# Patient Record
Sex: Female | Born: 1935 | Race: Black or African American | Hispanic: No | Smoking: Former smoker
Health system: Southern US, Community
[De-identification: ages and names within clinical notes are randomized; demographics above are authoritative.]

## PROBLEM LIST (undated history)

## (undated) DIAGNOSIS — E119 Type 2 diabetes mellitus without complications: Secondary | ICD-10-CM

## (undated) DIAGNOSIS — Z8669 Personal history of other diseases of the nervous system and sense organs: Secondary | ICD-10-CM

## (undated) DIAGNOSIS — N179 Acute kidney failure, unspecified: Secondary | ICD-10-CM

## (undated) DIAGNOSIS — I63132 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: Secondary | ICD-10-CM

## (undated) DIAGNOSIS — E1165 Type 2 diabetes mellitus with hyperglycemia: Secondary | ICD-10-CM

## (undated) DIAGNOSIS — Z9889 Other specified postprocedural states: Secondary | ICD-10-CM

## (undated) DIAGNOSIS — G459 Transient cerebral ischemic attack, unspecified: Secondary | ICD-10-CM

## (undated) DIAGNOSIS — M1990 Unspecified osteoarthritis, unspecified site: Secondary | ICD-10-CM

## (undated) DIAGNOSIS — E118 Type 2 diabetes mellitus with unspecified complications: Secondary | ICD-10-CM

## (undated) DIAGNOSIS — I10 Essential (primary) hypertension: Secondary | ICD-10-CM

## (undated) HISTORY — PX: TONSILLECTOMY: SUR1361

## (undated) HISTORY — PX: EYE SURGERY: SHX253

---

## 2011-12-23 ENCOUNTER — Other Ambulatory Visit (HOSPITAL_COMMUNITY)
Admission: RE | Admit: 2011-12-23 | Discharge: 2011-12-23 | Disposition: A | Discharge status: Home / Self Care | Payer: Self-pay | Source: Ambulatory Visit | Attending: Family Medicine | Admitting: Family Medicine

## 2011-12-23 DIAGNOSIS — Z124 Encounter for screening for malignant neoplasm of cervix: Secondary | ICD-10-CM | POA: Insufficient documentation

## 2014-01-12 ENCOUNTER — Observation Stay (HOSPITAL_COMMUNITY): Payer: Medicare PPO

## 2014-01-12 ENCOUNTER — Inpatient Hospital Stay (HOSPITAL_COMMUNITY)
Admission: EM | Admit: 2014-01-12 | Discharge: 2014-01-14 | DRG: 066 | Disposition: A | Discharge status: Home / Self Care | Payer: Medicare PPO | Attending: Internal Medicine | Admitting: Internal Medicine

## 2014-01-12 ENCOUNTER — Encounter (HOSPITAL_COMMUNITY): Payer: Self-pay

## 2014-01-12 ENCOUNTER — Other Ambulatory Visit: Payer: Self-pay

## 2014-01-12 ENCOUNTER — Emergency Department (HOSPITAL_COMMUNITY): Payer: Medicare PPO

## 2014-01-12 DIAGNOSIS — I10 Essential (primary) hypertension: Secondary | ICD-10-CM | POA: Diagnosis present

## 2014-01-12 DIAGNOSIS — I63132 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: Principal | ICD-10-CM | POA: Insufficient documentation

## 2014-01-12 DIAGNOSIS — I639 Cerebral infarction, unspecified: Secondary | ICD-10-CM | POA: Diagnosis present

## 2014-01-12 DIAGNOSIS — G459 Transient cerebral ischemic attack, unspecified: Secondary | ICD-10-CM | POA: Diagnosis present

## 2014-01-12 DIAGNOSIS — I739 Peripheral vascular disease, unspecified: Secondary | ICD-10-CM

## 2014-01-12 DIAGNOSIS — Z79899 Other long term (current) drug therapy: Secondary | ICD-10-CM

## 2014-01-12 DIAGNOSIS — E119 Type 2 diabetes mellitus without complications: Secondary | ICD-10-CM

## 2014-01-12 DIAGNOSIS — I779 Disorder of arteries and arterioles, unspecified: Secondary | ICD-10-CM | POA: Diagnosis present

## 2014-01-12 DIAGNOSIS — Z87891 Personal history of nicotine dependence: Secondary | ICD-10-CM

## 2014-01-12 DIAGNOSIS — R278 Other lack of coordination: Secondary | ICD-10-CM | POA: Diagnosis present

## 2014-01-12 DIAGNOSIS — R4781 Slurred speech: Secondary | ICD-10-CM | POA: Diagnosis not present

## 2014-01-12 DIAGNOSIS — E785 Hyperlipidemia, unspecified: Secondary | ICD-10-CM | POA: Diagnosis present

## 2014-01-12 DIAGNOSIS — Z8673 Personal history of transient ischemic attack (TIA), and cerebral infarction without residual deficits: Secondary | ICD-10-CM

## 2014-01-12 DIAGNOSIS — R471 Dysarthria and anarthria: Secondary | ICD-10-CM | POA: Diagnosis present

## 2014-01-12 DIAGNOSIS — R29898 Other symptoms and signs involving the musculoskeletal system: Secondary | ICD-10-CM | POA: Diagnosis present

## 2014-01-12 DIAGNOSIS — I6789 Other cerebrovascular disease: Secondary | ICD-10-CM

## 2014-01-12 HISTORY — DX: Transient cerebral ischemic attack, unspecified: G45.9

## 2014-01-12 HISTORY — DX: Essential (primary) hypertension: I10

## 2014-01-12 HISTORY — DX: Type 2 diabetes mellitus without complications: E11.9

## 2014-01-12 LAB — URINALYSIS, ROUTINE W REFLEX MICROSCOPIC
BILIRUBIN URINE: NEGATIVE
Glucose, UA: NEGATIVE mg/dL
Hgb urine dipstick: NEGATIVE
Ketones, ur: 15 mg/dL — AB
Leukocytes, UA: NEGATIVE
Nitrite: NEGATIVE
PH: 6.5 (ref 5.0–8.0)
Protein, ur: NEGATIVE mg/dL
SPECIFIC GRAVITY, URINE: 1.02 (ref 1.005–1.030)
Urobilinogen, UA: 1 mg/dL (ref 0.0–1.0)

## 2014-01-12 LAB — CBC WITH DIFFERENTIAL/PLATELET
Basophils Absolute: 0 10*3/uL (ref 0.0–0.1)
Basophils Relative: 0 % (ref 0–1)
EOS ABS: 0.1 10*3/uL (ref 0.0–0.7)
EOS PCT: 1 % (ref 0–5)
HCT: 43 % (ref 36.0–46.0)
HEMOGLOBIN: 14 g/dL (ref 12.0–15.0)
LYMPHS ABS: 1.1 10*3/uL (ref 0.7–4.0)
Lymphocytes Relative: 23 % (ref 12–46)
MCH: 27.6 pg (ref 26.0–34.0)
MCHC: 32.6 g/dL (ref 30.0–36.0)
MCV: 84.8 fL (ref 78.0–100.0)
MONOS PCT: 23 % — AB (ref 3–12)
Monocytes Absolute: 1.1 10*3/uL — ABNORMAL HIGH (ref 0.1–1.0)
NEUTROS PCT: 53 % (ref 43–77)
Neutro Abs: 2.5 10*3/uL (ref 1.7–7.7)
Platelets: 178 10*3/uL (ref 150–400)
RBC: 5.07 MIL/uL (ref 3.87–5.11)
RDW: 14.3 % (ref 11.5–15.5)
WBC: 4.9 10*3/uL (ref 4.0–10.5)

## 2014-01-12 LAB — GLUCOSE, CAPILLARY
GLUCOSE-CAPILLARY: 127 mg/dL — AB (ref 70–99)
GLUCOSE-CAPILLARY: 93 mg/dL (ref 70–99)
GLUCOSE-CAPILLARY: 165 mg/dL — AB (ref 70–99)

## 2014-01-12 LAB — COMPREHENSIVE METABOLIC PANEL
ALT: 10 U/L (ref 0–35)
ANION GAP: 16 — AB (ref 5–15)
AST: 18 U/L (ref 0–37)
Albumin: 3.5 g/dL (ref 3.5–5.2)
Alkaline Phosphatase: 95 U/L (ref 39–117)
BUN: 19 mg/dL (ref 6–23)
CALCIUM: 9.1 mg/dL (ref 8.4–10.5)
CO2: 23 meq/L (ref 19–32)
CREATININE: 0.94 mg/dL (ref 0.50–1.10)
Chloride: 100 mEq/L (ref 96–112)
GFR calc non Af Amer: 57 mL/min — ABNORMAL LOW (ref >90)
GFR, EST AFRICAN AMERICAN: 67 mL/min — AB (ref >90)
Glucose, Bld: 139 mg/dL — ABNORMAL HIGH (ref 70–99)
Potassium: 4.2 mEq/L (ref 3.7–5.3)
Sodium: 139 mEq/L (ref 137–147)
Total Bilirubin: 0.4 mg/dL (ref 0.3–1.2)
Total Protein: 7.1 g/dL (ref 6.0–8.3)

## 2014-01-12 LAB — RAPID URINE DRUG SCREEN, HOSP PERFORMED
Amphetamines: NOT DETECTED
Barbiturates: NOT DETECTED
Benzodiazepines: NOT DETECTED
COCAINE: NOT DETECTED
Opiates: NOT DETECTED
Tetrahydrocannabinol: NOT DETECTED

## 2014-01-12 LAB — APTT: APTT: 25 s (ref 24–37)

## 2014-01-12 LAB — POCT I-STAT, CHEM 8
BUN: 20 mg/dL (ref 6–23)
Calcium, Ion: 1.03 mmol/L — ABNORMAL LOW (ref 1.13–1.30)
Chloride: 103 mEq/L (ref 96–112)
Creatinine, Ser: 1 mg/dL (ref 0.50–1.10)
GLUCOSE: 143 mg/dL — AB (ref 70–99)
HCT: 46 % (ref 36.0–46.0)
HEMOGLOBIN: 15.6 g/dL — AB (ref 12.0–15.0)
Potassium: 3.8 mEq/L (ref 3.7–5.3)
Sodium: 137 mEq/L (ref 137–147)
TCO2: 24 mmol/L (ref 0–100)

## 2014-01-12 LAB — HEMOGLOBIN A1C
HEMOGLOBIN A1C: 7.2 % — AB (ref <5.7)
Mean Plasma Glucose: 160 mg/dL — ABNORMAL HIGH (ref <117)

## 2014-01-12 LAB — PROTIME-INR
INR: 0.99 (ref 0.00–1.49)
Prothrombin Time: 13.1 seconds (ref 11.6–15.2)

## 2014-01-12 LAB — CBG MONITORING, ED: Glucose-Capillary: 133 mg/dL — ABNORMAL HIGH (ref 70–99)

## 2014-01-12 LAB — ETHANOL

## 2014-01-12 MED ORDER — HYDRALAZINE HCL 20 MG/ML IJ SOLN: 10.0000 mg | Freq: Four times a day (QID) | INTRAMUSCULAR | Status: DC | PRN

## 2014-01-12 MED ORDER — STROKE: EARLY STAGES OF RECOVERY BOOK
Freq: Once | Status: AC
  Administered 2014-01-13: 13:00:00
  Filled 2014-01-12: qty 1

## 2014-01-12 MED ORDER — ATORVASTATIN CALCIUM 20 MG PO TABS
20.0000 mg | ORAL_TABLET | Freq: Every day | ORAL | Status: DC
  Administered 2014-01-12 – 2014-01-13 (×2): 20 mg via ORAL
  Filled 2014-01-12 (×2): qty 1

## 2014-01-12 MED ORDER — ASPIRIN 325 MG PO TABS
325.0000 mg | ORAL_TABLET | Freq: Every day | ORAL | Status: DC
  Administered 2014-01-12 – 2014-01-13 (×2): 325 mg via ORAL

## 2014-01-12 MED ORDER — HEPARIN SODIUM (PORCINE) 5000 UNIT/ML IJ SOLN
5000.0000 [IU] | Freq: Three times a day (TID) | INTRAMUSCULAR | Status: DC
  Administered 2014-01-12 – 2014-01-14 (×6): 5000 [IU] via SUBCUTANEOUS
  Filled 2014-01-12 (×3): qty 1

## 2014-01-12 MED ORDER — SODIUM CHLORIDE 0.9 % IV SOLN
INTRAVENOUS | Status: DC
Start: 2014-01-12 — End: 2014-01-13
  Administered 2014-01-12: 17:00:00 via INTRAVENOUS

## 2014-01-12 MED ORDER — ASPIRIN 300 MG RE SUPP: 300.0000 mg | Freq: Every day | RECTAL | Status: DC

## 2014-01-12 MED ORDER — INSULIN ASPART 100 UNIT/ML ~~LOC~~ SOLN
0.0000 [IU] | Freq: Three times a day (TID) | SUBCUTANEOUS | Status: DC
Start: 2014-01-12 — End: 2014-01-14
  Administered 2014-01-13 – 2014-01-14 (×3): 1 [IU] via SUBCUTANEOUS
  Administered 2014-01-14: 2 [IU] via SUBCUTANEOUS

## 2014-01-12 MED ORDER — ACETAMINOPHEN 650 MG RE SUPP: 650.0000 mg | RECTAL | Status: DC | PRN

## 2014-01-12 MED ORDER — ACETAMINOPHEN 325 MG PO TABS: 650.0000 mg | ORAL_TABLET | ORAL | Status: DC | PRN

## 2014-01-12 MED ORDER — INSULIN ASPART 100 UNIT/ML ~~LOC~~ SOLN: 0.0000 [IU] | Freq: Every day | SUBCUTANEOUS | Status: DC

## 2014-01-12 NOTE — H&P (Signed)
History and Physical       Hospital Admission Note Date: 01/12/2014  Patient name: Kathleen Figueroa Medical record number: [id number] Date of birth: 1935/09/26 Age: 78 y.o. Gender: female  PCP: Gaye Alken, MD    Chief Complaint:  Right arm weakness, headache with slurred speech  HPI: Patient is a 78 year old female with prior history of TIA, hypertension, diabetes mellitus, hyperlipidemia presented to ED with right arm clumsiness, headache yesterday with slurred speech. Patient reported that she noticed her right hand was clumsy after the Thanksgiving, patient thought she was tired. However 2 days ago on Wednesday, patient started having headache with slurred speech. She reports that her right hand was clumsy, weak with difficulty coordination or writing. Patient called her PCP today who advised her to go to the ER asap. Patient was seen by neurology, she was not considered a TPA candidate due to delayed presentation. CT head showed possible recent infarct at the left temporal occipital junction with a trophy and multiple prior small lacunar infarcts in the thalamic and basal ganglia regions as well as moderate small vessel disease in the centrum semiovale. Patient is being admitted for stroke workup.   Review of Systems:  Constitutional: Denies fever, chills, diaphoresis, poor appetite and fatigue.  HEENT: Denies photophobia, eye pain, redness, hearing loss, ear pain, congestion, sore throat, rhinorrhea, sneezing, mouth sores, trouble swallowing, neck pain, neck stiffness and tinnitus.   Respiratory: Denies SOB, DOE, cough, chest tightness,  and wheezing.   Cardiovascular: Denies chest pain, palpitations and leg swelling.  Gastrointestinal: Denies nausea, vomiting, abdominal pain, diarrhea, constipation, blood in stool and abdominal distention.  Genitourinary: Denies dysuria, urgency, frequency, hematuria, flank pain and  difficulty urinating.  Musculoskeletal: Denies myalgias, back pain, joint swelling, arthralgias and gait problem.  Skin: Denies pallor, rash and wound.  Neurological:Please see history of present illness  Hematological: Denies adenopathy. Easy bruising, personal or family bleeding history  Psychiatric/Behavioral: Denies suicidal ideation, mood changes, confusion, nervousness, sleep disturbance and agitation  Past Medical History: Past Medical History  Diagnosis Date  . TIA (transient ischemic attack)   . Diabetes mellitus without complication   . Hypertension    Past Surgical History  Procedure Laterality Date  . Eye surgery      Medications: Prior to Admission medications   Medication Sig Start Date End Date Taking? Authorizing Provider  amLODipine (NORVASC) 10 MG tablet Take 10 mg by mouth daily.   Yes Historical Provider, MD  atorvastatin (LIPITOR) 20 MG tablet Take 20 mg by mouth daily.   Yes Historical Provider, MD  metFORMIN (GLUCOPHAGE) 1000 MG tablet Take 1,000 mg by mouth daily with breakfast.   Yes Historical Provider, MD  valsartan (DIOVAN) 320 MG tablet Take 320 mg by mouth daily.   Yes Historical Provider, MD    Allergies:   Allergies  Allergen Reactions  . Aspirin Nausea Only    Social History:  reports that she quit smoking about 30 years ago. She does not have any smokeless tobacco history on file. She reports that she does not drink alcohol. Her drug history is not on file.  Family History: Family History  Problem Relation Age of Onset  . Hypertension Mother   . Hypertension Father     Physical Exam: Blood pressure 159/74, pulse 80, temperature 98.1 F (36.7 C), temperature source Oral, resp. rate 21, SpO2 96 %. General: Alert, awake, oriented x3, in no acute distress. HEENT: normocephalic, atraumatic, anicteric sclera, pink conjunctiva, pupils equal and reactive to light and accomodation,  oropharynx clear Neck: supple, no masses or lymphadenopathy,  no goiter, no bruits  Heart: Regular rate and rhythm, without murmurs, rubs or gallops. Lungs: Clear to auscultation bilaterally, no wheezing, rales or rhonchi. Abdomen: Soft, nontender, nondistended, positive bowel sounds, no masses. Extremities: No clubbing, cyanosis or edema with positive pedal pulses. Neuro: Grossly intact, no focal neurological deficits, strength 5/5 upper and lower extremities bilaterally Psych: alert and oriented x 3, normal mood and affect Skin: no rashes or lesions, warm and dry   LABS on Admission:  Basic Metabolic Panel:  Recent Labs Lab 01/12/14 1045  NA 139  K 4.2  CL 100  CO2 23  GLUCOSE 139*  BUN 19  CREATININE 0.94  CALCIUM 9.1   Liver Function Tests:  Recent Labs Lab 01/12/14 1045  AST 18  ALT 10  ALKPHOS 95  BILITOT 0.4  PROT 7.1  ALBUMIN 3.5   No results for input(s): LIPASE, AMYLASE in the last 168 hours. No results for input(s): AMMONIA in the last 168 hours. CBC:  Recent Labs Lab 01/12/14 1045  WBC 4.9  NEUTROABS 2.5  HGB 14.0  HCT 43.0  MCV 84.8  PLT 178   Cardiac Enzymes: No results for input(s): CKTOTAL, CKMB, CKMBINDEX, TROPONINI in the last 168 hours. BNP: Invalid input(s): POCBNP CBG:  Recent Labs Lab 01/12/14 1037  GLUCAP 133*     Radiological Exams on Admission: Ct Head Wo Contrast  01/12/2014   CLINICAL DATA:  Two day history of right arm weakness and vision difficulty; 1 day history of headache  EXAM: CT HEAD WITHOUT CONTRAST  TECHNIQUE: Contiguous axial images were obtained from the base of the skull through the vertex without intravenous contrast.  COMPARISON:  None.  FINDINGS: There is mild generalized atrophy. There is no intracranial mass, hemorrhage, extra-axial fluid collection, or midline shift. There is an age uncertain infarct at the left temporal -occipital junction. There is patchy small vessel disease in the centra semiovale bilaterally. There are small chronic appearing lacunar infarcts  scattered throughout both basal ganglia regions as well as in both thalami. The bony calvarium appears intact. The mastoid air cells are clear. Cataract formation is noted in the right eye.  IMPRESSION: Age uncertain and possibly recent infarct at the left temporal -occipital junction. There is atrophy with multiple prior small lacunar infarcts in the thalamic and basal ganglia regions as well as moderate small vessel disease in the centra semiovale. Mild generalized atrophy. No hemorrhage or mass effect.   Electronically Signed   By: Bretta BangWilliam  Woodruff M.D.   On: 01/12/2014 11:04    Assessment/Plan Principal Problem:   Right arm weakness, dysarthria, headache/ Ac CVA: CT head shows recent infarct at the left temporal occipital junction. Risks factors diabetes, hypertension, hyperlipidemia, prior history of TIA, not on aspirin PTA -Admit for full stroke workup, obtain MRI, MRA brain - Obtain 2-D echo, carotid Dopplers, lipid panel, hemoglobin A1c - Placed on aspirin 325 mg daily PO or PR (pending swallow evaluation) - PT, OT, ST eval, neurology consulted  Active Problems:   Diabetes mellitus without complication - Hold metformin, place on sliding serum insulin, obtain hemoglobin A1c    Hypertension - Hold antihypertensives, place on hydralazine as needed with parameters  Hyperlipidemia - Obtain lipid panel, continue statins   DVT prophylaxis: Lovenox  CODE STATUS: Full code  Family Communication: Admission, patients condition and plan of care including tests being ordered have been discussed with the patient who indicates understanding and agree with the plan and  Code Status   Further plan will depend as patient's clinical course evolves and further radiologic and laboratory data become available.   Time Spent on Admission: 1 hour  RAI,RIPUDEEP M.D. [facility] 01/12/2014, 12:20 PM Pager: [phone]  If 7PM-7AM, please contact night-coverage [email or web address] Password  [email or web address]

## 2014-01-12 NOTE — Progress Notes (Signed)
Echo Lab  2D Echocardiogram completed.  Katharina CaperMelissa L Ciearra Rufo, RDCS 01/12/2014 3:33 PM

## 2014-01-12 NOTE — Consult Note (Signed)
Referring Physician: Pollina    Chief Complaint: stroke  HPI:                                                                                                                                         Kathleen Figueroa is an 78 y.o. female with known risk factors of HTN, DM and hyperlipidemia.  Pateitn states she had one episode 8 days ago during thanksgiving when she had right hand clumsiness. This resolved.  She then had another episode two days ago with right hand and arm clumsiness. This did not resolve but she did not seek medical attention.  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 At present time she feels her symptoms have resolve.   Date last known well: Unable to determine Time last known well: Unable to determine tPA Given: No: no known LSN  Past Medical History  Diagnosis Date  . TIA (transient ischemic attack)   . Diabetes mellitus without complication   . Hypertension     Past Surgical History  Procedure Laterality Date  . Eye surgery      Family History  Problem Relation Age of Onset  . Hypertension Mother   . Hypertension Father    Social History:  reports that she quit smoking about 30 years ago. She does not have any smokeless tobacco history on file. She reports that she does not drink alcohol. Her drug history is not on file.  Allergies:  Allergies  Allergen Reactions  . Aspirin Nausea Only    Medications:                                                                                                                           No current facility-administered medications for this encounter.   Current Outpatient Prescriptions  Medication Sig Dispense Refill  . amLODipine (NORVASC) 10 MG tablet Take 10 mg by mouth daily.    Marland Kitchen atorvastatin (LIPITOR) 20 MG tablet Take 20 mg by mouth daily.    . metFORMIN  (GLUCOPHAGE) 1000 MG tablet Take 1,000 mg by mouth daily with breakfast.    . valsartan (DIOVAN) 320 MG tablet Take 320 mg by mouth daily.       ROS:  History obtained from the patient  General ROS: negative for - chills, fatigue, fever, night sweats, weight gain or weight loss Psychological ROS: negative for - behavioral disorder, hallucinations, memory difficulties, mood swings or suicidal ideation Ophthalmic ROS: negative for - blurry vision, double vision, eye pain or loss of vision ENT ROS: negative for - epistaxis, nasal discharge, oral lesions, sore throat, tinnitus or vertigo Allergy and Immunology ROS: negative for - hives or itchy/watery eyes Hematological and Lymphatic ROS: negative for - bleeding problems, bruising or swollen lymph nodes Endocrine ROS: negative for - galactorrhea, hair pattern changes, polydipsia/polyuria or temperature intolerance Respiratory ROS: negative for - cough, hemoptysis, shortness of breath or wheezing Cardiovascular ROS: negative for - chest pain, dyspnea on exertion, edema or irregular heartbeat Gastrointestinal ROS: negative for - abdominal pain, diarrhea, hematemesis, nausea/vomiting or stool incontinence Genito-Urinary ROS: negative for - dysuria, hematuria, incontinence or urinary frequency/urgency Musculoskeletal ROS: negative for - joint swelling or muscular weakness Neurological ROS: as noted in HPI Dermatological ROS: negative for rash and skin lesion changes  Neurologic Examination:                                                                                                      Blood pressure 170/60, pulse 87, temperature 98.2 F (36.8 C), temperature source Oral, resp. rate 18, SpO2 97 %.   Physical Exam  Constitutional: He appears well-developed and well-nourished.  Psych: Affect appropriate to  situation Eyes: No scleral injection HENT: No OP obstrucion Head: Normocephalic.  Cardiovascular: Normal rate and regular rhythm.  Respiratory: Effort normal and breath sounds normal.  GI: Soft. Bowel sounds are normal. No distension. There is no tenderness.  Skin: WDI  Neuro Exam: Mental Status: Alert, oriented, thought content appropriate.  Speech fluent without evidence of aphasia.  Able to follow 3 step commands without difficulty. Cranial Nerves: II: Discs flat bilaterally; Visual fields grossly normal, right pupil was oval (post surgical) and left was round, reactive to light and accommodation III,IV, VI: ptosis not present, extra-ocular motions intact bilaterally V,VII: mild flattening of R NLF, facial light touch sensation normal bilaterally VIII: hearing normal bilaterally IX,X: gag reflex present XI: bilateral shoulder shrug XII: midline tongue extension without atrophy or fasciculations  Motor: Right : Upper extremity   5/5    Left:     Upper extremity   5/5  Lower extremity   5/5     Lower extremity   5/5 --Right UE pronator drift  Tone and bulk:normal tone throughout; no atrophy noted Sensory: Pinprick and light touch intact throughout, bilaterally Deep Tendon Reflexes:  Right: Upper Extremity   Left: Upper extremity   biceps (C-5 to C-6) 2/4   biceps (C-5 to C-6) 2/4 tricep (C7) 2/4    triceps (C7) 2/4 Brachioradialis (C6) 2/4  Brachioradialis (C6) 2/4  Lower Extremity Lower Extremity  quadriceps (L-2 to L-4) 0/4   quadriceps (L-2 to L-4) 0/4 Achilles (S1) 0/4   Achilles (S1) 0/4  Plantars: Right: downgoing   Left: downgoing Cerebellar: normal finger-to-nose,  normal heel-to-shin test Gait: not tested due to saftey  CV: pulses palpable throughout    Lab Results: Basic Metabolic Panel:  Recent Labs Lab 01/12/14 1045  NA 139  K 4.2  CL 100  CO2 23  GLUCOSE 139*  BUN 19  CREATININE 0.94  CALCIUM 9.1    Liver Function Tests:  Recent Labs Lab  01/12/14 1045  AST 18  ALT 10  ALKPHOS 95  BILITOT 0.4  PROT 7.1  ALBUMIN 3.5   No results for input(s): LIPASE, AMYLASE in the last 168 hours. No results for input(s): AMMONIA in the last 168 hours.  CBC:  Recent Labs Lab 01/12/14 1045  WBC 4.9  NEUTROABS 2.5  HGB 14.0  HCT 43.0  MCV 84.8  PLT 178    Cardiac Enzymes: No results for input(s): CKTOTAL, CKMB, CKMBINDEX, TROPONINI in the last 168 hours.  Lipid Panel: No results for input(s): CHOL, TRIG, HDL, CHOLHDL, VLDL, LDLCALC in the last 168 hours.  CBG:  Recent Labs Lab 01/12/14 1037  GLUCAP 133*    Microbiology: No results found for this or any previous visit.  Coagulation Studies:  Recent Labs  01/12/14 1045  LABPROT 13.1  INR 0.99    Imaging: Ct Head Wo Contrast  01/12/2014   CLINICAL DATA:  Two day history of right arm weakness and vision difficulty; 1 day history of headache  EXAM: CT HEAD WITHOUT CONTRAST  TECHNIQUE: Contiguous axial images were obtained from the base of the skull through the vertex without intravenous contrast.  COMPARISON:  None.  FINDINGS: There is mild generalized atrophy. There is no intracranial mass, hemorrhage, extra-axial fluid collection, or midline shift. There is an age uncertain infarct at the left temporal -occipital junction. There is patchy small vessel disease in the centra semiovale bilaterally. There are small chronic appearing lacunar infarcts scattered throughout both basal ganglia regions as well as in both thalami. The bony calvarium appears intact. The mastoid air cells are clear. Cataract formation is noted in the right eye.  IMPRESSION: Age uncertain and possibly recent infarct at the left temporal -occipital junction. There is atrophy with multiple prior small lacunar infarcts in the thalamic and basal ganglia regions as well as moderate small vessel disease in the centra semiovale. Mild generalized atrophy. No hemorrhage or mass effect.   Electronically Signed    By: Bretta BangWilliam  Woodruff M.D.   On: 01/12/2014 11:04       Assessment and plan discussed with with attending physician and they are in agreement.    Felicie MornDavid Smith PA-C [facility] [phone]  01/12/2014, 11:37 AM   Assessment: 78 y.o. female female with two episodes of right arm weakness and most recent episode of right arm weakness and expressive difficulties.  She remains to show right arm pronator drift on exam and CT head shows possible stroke in the left temporal-occipital junction. Patient would benefit from stroke work up given stuttering symptoms and current right arm weakness.    Stroke Risk Factors - diabetes mellitus, hyperlipidemia and hypertension  1. HgbA1c, fasting lipid panel 2. MRI, MRA  of the brain without contrast 3. PT consult, OT consult, Speech consult 4. Echocardiogram 5. Carotid dopplers 6. Prophylactic therapy-Antiplatelet med: Aspirin - dose 81 mg daily 7. Risk factor modification 8. Telemetry monitoring 9. Frequent neuro checks   Patient seen and evaluated. Agree with above assessment and plan. In brief she is a 78y/o woman with multiple risk factors presenting with right sided weakness. Her symptoms have been on and off for a few days and have persisted since yesterday.  Will admit for stroke workup.   Elspeth Cho, DO [facility] [phone]  If 7pm- 7am, please page neurology on call as listed in AMION.

## 2014-01-12 NOTE — ED Notes (Signed)
CBG 133 

## 2014-01-12 NOTE — ED Provider Notes (Signed)
CSN: [id number]     Arrival date & time 01/12/14  1007 History   First MD Initiated Contact with Patient 01/12/14 1010     Chief Complaint  Patient presents with  . Stroke Symptoms     (Consider location/radiation/quality/duration/timing/severity/associated sxs/prior Treatment) The history is provided by the patient. No language interpreter was used.  Kathleen Figueroa is a 78 y/o F with PMHx of HTN, DM, TIA x 5 years ago presenting to the ED with stroke like symptoms that started yesterday, but have now resolved. Patient reported that back on Thanksgiving she was experiencing muscle spasms to her right arm and stated that she thought this was due to her cooking a lot. Reported that she gets intermittent spasms to her right arm, but stated that she has been having weakness to the right arm starting yesterday. Patient reported that 2 days ago she woke up with a headache localized to the top of the head described as a pressure sensation that resolved on its own - denied thunderclap, worsening, or worst headache of life. Stated that yesterday while reading the Bible patient was having difficulty forming the words, stated that she knew the words, but stated that she was unable to produce them. Denied fever, chills, neck pain, neck stiffness, chest pain, shortness of breath, difficulty breathing, numbness, tingling, fall, head injury, blurred vision, sudden loss of vision. PCP Dr. Francine Graven at [facility]  Past Medical History  Diagnosis Date  . TIA (transient ischemic attack)   . Diabetes mellitus without complication   . Hypertension    Past Surgical History  Procedure Laterality Date  . Eye surgery     Family History  Problem Relation Age of Onset  . Hypertension Mother   . Hypertension Father    History  Substance Use Topics  . Smoking status: Former Smoker    Quit date: 02/10/1983  . Smokeless tobacco: Not on file  . Alcohol Use: No   OB History    No data available     Review  of Systems  Constitutional: Negative for fever and chills.  Eyes: Negative for visual disturbance.  Respiratory: Negative for chest tightness and shortness of breath.   Cardiovascular: Negative for chest pain.  Gastrointestinal: Negative for nausea, vomiting and abdominal pain.  Musculoskeletal: Negative for back pain and neck pain.  Neurological: Positive for weakness and headaches. Negative for numbness.      Allergies  Aspirin  Home Medications   Prior to Admission medications   Medication Sig Start Date End Date Taking? Authorizing Provider  amLODipine (NORVASC) 10 MG tablet Take 10 mg by mouth daily.   Yes Historical Provider, MD  atorvastatin (LIPITOR) 20 MG tablet Take 20 mg by mouth daily.   Yes Historical Provider, MD  metFORMIN (GLUCOPHAGE) 1000 MG tablet Take 1,000 mg by mouth daily with breakfast.   Yes Historical Provider, MD  valsartan (DIOVAN) 320 MG tablet Take 320 mg by mouth daily.   Yes Historical Provider, MD   BP 154/89 mmHg  Pulse 79  Temp(Src) 98.1 F (36.7 C) (Oral)  Resp 16  SpO2 96% Physical Exam  Constitutional: She is oriented to person, place, and time. She appears well-developed and well-nourished. No distress.  HENT:  Head: Normocephalic and atraumatic.  Mouth/Throat: Oropharynx is clear and moist. No oropharyngeal exudate.  Eyes: Conjunctivae and EOM are normal. Pupils are equal, round, and reactive to light. Right eye exhibits no discharge. Left eye exhibits no discharge.  Neck: Normal range of motion. Neck supple.  No tracheal deviation present.  Cardiovascular: Normal rate, regular rhythm and normal heart sounds.  Exam reveals no friction rub.   No murmur heard. Pulses:      Radial pulses are 2+ on the right side, and 2+ on the left side.       Dorsalis pedis pulses are 2+ on the right side, and 2+ on the left side.  Cap refill < 3 seconds Negative swelling or pitting edema noted to the lower extremities bilaterally   Pulmonary/Chest:  Effort normal and breath sounds normal. No respiratory distress. She has no wheezes. She has no rales.  Musculoskeletal: Normal range of motion.  Full ROM to upper and lower extremities without difficulty noted, negative ataxia noted.  Lymphadenopathy:    She has no cervical adenopathy.  Neurological: She is alert and oriented to person, place, and time. No cranial nerve deficit. She exhibits normal muscle tone. Coordination normal.  Cranial nerves III-XII grossly intact Strength 5+/5+ to upper and lower extremities bilaterally with resistance applied, equal distribution noted Equal grip strength bilaterally  Sensation intact with differentiation to sharp and dull touch  Negative facial droop Negative slurred speech  Negative aphasia Negative arm drift Fine motor skills intact Patient follows commands well Patient response to questions appropriately GCS 15  Skin: Skin is warm and dry. No rash noted. She is not diaphoretic. No erythema.  Psychiatric: She has a normal mood and affect. Her behavior is normal. Thought content normal.  Nursing note and vitals reviewed.   ED Course  Procedures (including critical care time)  Results for orders placed or performed during the hospital encounter of 01/12/14  Ethanol  Result Value Ref Range   Alcohol, Ethyl (B) <11 0 - 11 mg/dL  Protime-INR  Result Value Ref Range   Prothrombin Time 13.1 11.6 - 15.2 seconds   INR 0.99 0.00 - 1.49  APTT  Result Value Ref Range   aPTT 25 24 - 37 seconds  Comprehensive metabolic panel  Result Value Ref Range   Sodium 139 137 - 147 mEq/L   Potassium 4.2 3.7 - 5.3 mEq/L   Chloride 100 96 - 112 mEq/L   CO2 23 19 - 32 mEq/L   Glucose, Bld 139 (H) 70 - 99 mg/dL   BUN 19 6 - 23 mg/dL   Creatinine, Ser 1.610.94 0.50 - 1.10 mg/dL   Calcium 9.1 8.4 - 09.610.5 mg/dL   Total Protein 7.1 6.0 - 8.3 g/dL   Albumin 3.5 3.5 - 5.2 g/dL   AST 18 0 - 37 U/L   ALT 10 0 - 35 U/L   Alkaline Phosphatase 95 39 - 117 U/L    Total Bilirubin 0.4 0.3 - 1.2 mg/dL   GFR calc non Af Amer 57 (L) >90 mL/min   GFR calc Af Amer 67 (L) >90 mL/min   Anion gap 16 (H) 5 - 15  CBC WITH DIFFERENTIAL  Result Value Ref Range   WBC 4.9 4.0 - 10.5 K/uL   RBC 5.07 3.87 - 5.11 MIL/uL   Hemoglobin 14.0 12.0 - 15.0 g/dL   HCT 04.543.0 40.936.0 - 81.146.0 %   MCV 84.8 78.0 - 100.0 fL   MCH 27.6 26.0 - 34.0 pg   MCHC 32.6 30.0 - 36.0 g/dL   RDW 91.414.3 78.211.5 - 95.615.5 %   Platelets 178 150 - 400 K/uL   Neutrophils Relative % 53 43 - 77 %   Neutro Abs 2.5 1.7 - 7.7 K/uL   Lymphocytes Relative 23 12 -  46 %   Lymphs Abs 1.1 0.7 - 4.0 K/uL   Monocytes Relative 23 (H) 3 - 12 %   Monocytes Absolute 1.1 (H) 0.1 - 1.0 K/uL   Eosinophils Relative 1 0 - 5 %   Eosinophils Absolute 0.1 0.0 - 0.7 K/uL   Basophils Relative 0 0 - 1 %   Basophils Absolute 0.0 0.0 - 0.1 K/uL  POC CBG, ED  Result Value Ref Range   Glucose-Capillary 133 (H) 70 - 99 mg/dL   Comment 1 Documented in Chart    Comment 2 Notify RN     Labs Review Labs Reviewed  COMPREHENSIVE METABOLIC PANEL - Abnormal; Notable for the following:    Glucose, Bld 139 (*)    GFR calc non Af Amer 57 (*)    GFR calc Af Amer 67 (*)    Anion gap 16 (*)    All other components within normal limits  CBC WITH DIFFERENTIAL - Abnormal; Notable for the following:    Monocytes Relative 23 (*)    Monocytes Absolute 1.1 (*)    All other components within normal limits  CBG MONITORING, ED - Abnormal; Notable for the following:    Glucose-Capillary 133 (*)    All other components within normal limits  ETHANOL  PROTIME-INR  APTT  CBC  DIFFERENTIAL  URINE RAPID DRUG SCREEN (HOSP PERFORMED)  URINALYSIS, ROUTINE W REFLEX MICROSCOPIC  I-STAT CHEM 8, ED  I-STAT TROPOININ, ED  I-STAT TROPOININ, ED    Imaging Review Ct Head Wo Contrast  01/12/2014   CLINICAL DATA:  Two day history of right arm weakness and vision difficulty; 1 day history of headache  EXAM: CT HEAD WITHOUT CONTRAST  TECHNIQUE:  Contiguous axial images were obtained from the base of the skull through the vertex without intravenous contrast.  COMPARISON:  None.  FINDINGS: There is mild generalized atrophy. There is no intracranial mass, hemorrhage, extra-axial fluid collection, or midline shift. There is an age uncertain infarct at the left temporal -occipital junction. There is patchy small vessel disease in the centra semiovale bilaterally. There are small chronic appearing lacunar infarcts scattered throughout both basal ganglia regions as well as in both thalami. The bony calvarium appears intact. The mastoid air cells are clear. Cataract formation is noted in the right eye.  IMPRESSION: Age uncertain and possibly recent infarct at the left temporal -occipital junction. There is atrophy with multiple prior small lacunar infarcts in the thalamic and basal ganglia regions as well as moderate small vessel disease in the centra semiovale. Mild generalized atrophy. No hemorrhage or mass effect.   Electronically Signed   By: Bretta BangWilliam  Woodruff M.D.   On: 01/12/2014 11:04     EKG Interpretation None        Date: 01/12/2014  Rate: 86  Rhythm: normal sinus rhythm  QRS Axis: normal  Intervals: normal  ST/T Wave abnormalities: normal  Conduction Disutrbances:none  Narrative Interpretation:   Old EKG Reviewed: unchanged  EKG analyzed and reviewed by this provider and attending physician.    11:19 AM Patient being assessed by Neurology.   11:28 AM Neurology recommended admission under the care of internal medicine.   11:34 PM This provider spoke with Dr. Isidoro Donningai, [facility] - discussed case, labs, imaging, vitals, ED course in great detail. Patient to be admitted to Neuro Telemetry as observation.   MDM   Final diagnoses:  CVA (cerebral vascular accident)    Medications - No data to display  Filed Vitals:   01/12/14  1021 01/12/14 1045 01/12/14 1146 01/12/14 1200  BP: 170/60 159/74  154/89  Pulse: 87 80  79   Temp: 98.2 F (36.8 C)  98.1 F (36.7 C)   TempSrc: Oral     Resp: 18 21  16   SpO2: 97% 96%  96%   EKG noted normal sinus rhythm with a heart rate of 86 bpm. I-STAT troponin negative elevation. CBC unremarkable. CMP noted glucose to 139, anion gap of 16.0 mEq per liter. PT INR within normal limits. Ethanol negative elevation. CT head noted age uncertain and possibly recent infarct at the left temporal occipital junction-atrophy with multiple prior small lacunar infarcts in the filming and basal ganglia regions as well as moderate small vessel disease in the centra semi-oval, mild atrophy noted. No hemorrhage or mass effect identified. Urine pending - UA and UDS.  Patient seen and assessed by neurology who recommended patient to be admitted to the hospital for a further stroke workup to be performed-high suspicion of CVA identified - patient has positive risk factors. Discussed case in great detail with [facility]-patient to be admitted to neuro telemetry for further workup to be performed. Discussed plan for admission with patient agrees to plan of care. Patient stable for transfer.  Raymon Mutton, PA-C 01/12/14 1247  Gilda Crease, MD 01/13/14 [phone]

## 2014-01-12 NOTE — Progress Notes (Signed)
*  PRELIMINARY RESULTS* Vascular Ultrasound Carotid Duplex (Doppler) has been completed.  Preliminary findings: Right ICA: 1-39% stenosis. Left ICA: 80-99% stenosis. Bilateral vertebral: patent with antegrade flow.   Chrishaun Sasso FRANCES 01/12/2014, 4:14 PM

## 2014-01-12 NOTE — Progress Notes (Signed)
Kim L attempted to take pt to CT- Not ready per pateint's RN, will call [phone] when ready for transport

## 2014-01-12 NOTE — ED Provider Notes (Signed)
Patient presented to the ER with speech difficulty and right-sided weakness and numbness. Patient has been having intermittent episodes for 1 week. She has had episodes of right sided arm weakness and clumsiness. She has also had difficulty with speech intermittently. Patient reports a distant history of TIA. Her symptoms have currently resolved.  Face to face Exam: HEENT - PERRLA Lungs - CTAB Heart - RRR, no M/R/G Abd - S/NT/ND Neuro - alert, oriented x3  Plan: Patient with intermittent episodes of right sided arm numbness, tingling, weakness, clumsiness with speech difficulty. Concerning for stuttering TIA or mild CVA. Will require workup.   Gilda Creasehristopher J. Pollina, MD 01/12/14 [phone]

## 2014-01-12 NOTE — ED Notes (Signed)
Pt coming from home, reports she began having muscle spasms on Thanksgiving day. She also reports slurred speech beginning yesterday. She states she has had trouble reading and comprehending. She is A&O X4, denies pain at this time.

## 2014-01-13 DIAGNOSIS — E119 Type 2 diabetes mellitus without complications: Secondary | ICD-10-CM | POA: Diagnosis present

## 2014-01-13 DIAGNOSIS — I639 Cerebral infarction, unspecified: Secondary | ICD-10-CM

## 2014-01-13 DIAGNOSIS — Z79899 Other long term (current) drug therapy: Secondary | ICD-10-CM | POA: Diagnosis not present

## 2014-01-13 DIAGNOSIS — R278 Other lack of coordination: Secondary | ICD-10-CM | POA: Diagnosis present

## 2014-01-13 DIAGNOSIS — E785 Hyperlipidemia, unspecified: Secondary | ICD-10-CM | POA: Diagnosis present

## 2014-01-13 DIAGNOSIS — I10 Essential (primary) hypertension: Secondary | ICD-10-CM | POA: Insufficient documentation

## 2014-01-13 DIAGNOSIS — R471 Dysarthria and anarthria: Secondary | ICD-10-CM | POA: Diagnosis present

## 2014-01-13 DIAGNOSIS — R4781 Slurred speech: Secondary | ICD-10-CM | POA: Diagnosis present

## 2014-01-13 DIAGNOSIS — I63132 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: Secondary | ICD-10-CM | POA: Diagnosis present

## 2014-01-13 DIAGNOSIS — Z8673 Personal history of transient ischemic attack (TIA), and cerebral infarction without residual deficits: Secondary | ICD-10-CM | POA: Diagnosis not present

## 2014-01-13 DIAGNOSIS — Z87891 Personal history of nicotine dependence: Secondary | ICD-10-CM | POA: Diagnosis not present

## 2014-01-13 DIAGNOSIS — I6522 Occlusion and stenosis of left carotid artery: Secondary | ICD-10-CM

## 2014-01-13 HISTORY — DX: Cerebral infarction, unspecified: I63.9

## 2014-01-13 LAB — LIPID PANEL
Cholesterol: 197 mg/dL (ref 0–200)
HDL: 47 mg/dL (ref >39)
LDL CALC: 135 mg/dL — AB (ref 0–99)
TRIGLYCERIDES: 77 mg/dL (ref <150)
Total CHOL/HDL Ratio: 4.2 RATIO
VLDL: 15 mg/dL (ref 0–40)

## 2014-01-13 LAB — GLUCOSE, CAPILLARY
GLUCOSE-CAPILLARY: 137 mg/dL — AB (ref 70–99)
GLUCOSE-CAPILLARY: 138 mg/dL — AB (ref 70–99)
Glucose-Capillary: 123 mg/dL — ABNORMAL HIGH (ref 70–99)
Glucose-Capillary: 95 mg/dL (ref 70–99)

## 2014-01-13 LAB — HEMOGLOBIN A1C
Hgb A1c MFr Bld: 7 % — ABNORMAL HIGH (ref <5.7)
Mean Plasma Glucose: 154 mg/dL — ABNORMAL HIGH (ref <117)

## 2014-01-13 MED ORDER — ASPIRIN 325 MG PO TABS
ORAL_TABLET | ORAL | Status: AC
  Filled 2014-01-13: qty 1

## 2014-01-13 MED ORDER — ATORVASTATIN CALCIUM 40 MG PO TABS
40.0000 mg | ORAL_TABLET | Freq: Every day | ORAL | Status: DC
  Administered 2014-01-14: 40 mg via ORAL
  Filled 2014-01-13: qty 1

## 2014-01-13 MED ORDER — ATORVASTATIN CALCIUM 10 MG PO TABS
ORAL_TABLET | ORAL | Status: AC
Start: 2014-01-13 — End: 2014-01-13
  Filled 2014-01-13: qty 2

## 2014-01-13 MED ORDER — CLOPIDOGREL BISULFATE 75 MG PO TABS
75.0000 mg | ORAL_TABLET | Freq: Every day | ORAL | Status: DC
  Administered 2014-01-13 – 2014-01-14 (×2): 75 mg via ORAL
  Filled 2014-01-13 (×2): qty 1

## 2014-01-13 MED ORDER — ASPIRIN EC 81 MG PO TBEC
81.0000 mg | DELAYED_RELEASE_TABLET | Freq: Every day | ORAL | Status: DC
  Administered 2014-01-14: 81 mg via ORAL

## 2014-01-13 NOTE — Consult Note (Signed)
       Consult Note  Patient name: Kathleen Figueroa MRN: [id number] DOB: 03/19/1935 Sex: female  Consulting Physician:  Hospital service  Reason for Consult:  Chief Complaint  Patient presents with  . Stroke Symptoms    HISTORY OF PRESENT ILLNESS: I have been asked to see this 78-year-old female for evaluation of carotid stenosis in the setting of stroke.  The patient states that she begin having spasticity and clumsiness after Thanksgiving.  This improved however on Wednesday, 3 days ago it occurred again along with a headache and difficulty reading and slurred speech.  She reports that she had a clumsy right hand.  She went to her primary care doctor who sent her to the emergency department.  She is not a candidate for TPA.  She had a CT scan that showed a possible recent infarct in the left temporal occipital junction with multiple prior small lacunar infarcts in the thalamic and basal ganglia regions.MRI revealed multiple small foci acute infarcts in the left cerebral hemisphere along with chronic infarcts and moderate small vessel ischemic disease there was no major intracranial occlusion.  The patient reports that her symptoms are improving.  The patient suffers from diabetes which she states has been under good control.her most recent hemoglobin A1c was 7.2.her hypercholesterolemia is treated with a statin.  She is also taking medication for blood pressure.  She is a former smoker who quit in 1985  Past Medical History  Diagnosis Date  . TIA (transient ischemic attack)   . Diabetes mellitus without complication   . Hypertension     Past Surgical History  Procedure Laterality Date  . Eye surgery      History   Social History  . Marital Status: Unknown    Spouse Name: N/A    Number of Children: N/A  . Years of Education: N/A   Occupational History  . Not on file.   Social History Main Topics  . Smoking status: Former Smoker    Quit date: 02/10/1983  . Smokeless  tobacco: Not on file  . Alcohol Use: No  . Drug Use: Not on file  . Sexual Activity: Not on file   Other Topics Concern  . Not on file   Social History Narrative  . No narrative on file    Family History  Problem Relation Age of Onset  . Hypertension Mother   . Hypertension Father     Allergies as of 01/12/2014 - Review Complete 01/12/2014  Allergen Reaction Noted  . Aspirin Nausea Only 01/12/2014    Medications:  Prior to Admission medications   Medication Sig Start Date End Date Taking? Authorizing Provider  amLODipine (NORVASC) 10 MG tablet Take 10 mg by mouth daily.   Yes Historical Provider, MD  atorvastatin (LIPITOR) 20 MG tablet Take 20 mg by mouth daily.   Yes Historical Provider, MD  metFORMIN (GLUCOPHAGE) 1000 MG tablet Take 1,000 mg by mouth daily with breakfast.   Yes Historical Provider, MD  valsartan (DIOVAN) 320 MG tablet Take 320 mg by mouth daily.   Yes Historical Provider, MD    REVIEW OF SYSTEMS: Constitutional: Denies fever, chills, diaphoresis, poor appetite and fatigue.  HEENT: Denies photophobia, eye pain, redness, hearing loss, ear pain, congestion, sore throat, rhinorrhea, sneezing, mouth sores, trouble swallowing, neck pain, neck stiffness and tinnitus.  Respiratory: Denies SOB, DOE, cough, chest tightness, and wheezing.  Cardiovascular: Denies chest pain, palpitations and leg swelling.  Gastrointestinal: Denies nausea, vomiting, abdominal pain, diarrhea, constipation,   blood in stool and abdominal distention.  Genitourinary: Denies dysuria, urgency, frequency, hematuria, flank pain and difficulty urinating.  Musculoskeletal: Denies myalgias, back pain, joint swelling, arthralgias and gait problem.  Skin: Denies pallor, rash and wound.  Neurological:Please see history of present illness  Hematological: Denies adenopathy. Easy bruising, personal or family bleeding history  Psychiatric/Behavioral: Denies suicidal  ideation, mood changes, confusion, nervousness, sleep disturbance and agitation   PHYSICAL EXAMINATION: General: The patient appears their stated age.  Vital signs are BP 156/68 mmHg  Pulse 63  Temp(Src) 99 F (37.2 C) (Oral)  Resp 18  Ht 5\' 6"  (1.676 m)  Wt 178 lb 1.6 oz (80.786 kg)  BMI 28.76 kg/m2  SpO2 99% Pulmonary: Respirations are non-labored HEENT:  No gross abnormalities Abdomen: Soft and non-tender  Musculoskeletal: There are no major deformities.   Neurologic: No focal weakness or paresthesias are detected,she now has good strength in both extremities.  She has normal sensation on her face.  Smile was symmetric. Skin: There are no ulcer or rashes noted. Psychiatric: The patient has normal affect. Cardiovascular: There is a regular rate and rhythm without significant murmur appreciated.  Diagnostic Studies: Carotid Doppler studies show 80-99 percent left carotid stenosis and 1-39 percent right carotid stenosis.  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  I discussed the risks and benefits of each option.  I feel that she is best suited for left carotid endarterectomy.  I discussed the risks and benefits which include but are not limited to bleeding, cardiopulmonary complications, nerve injury, and stroke.  I am okay with starting her on Plavix.  I did discuss that this may increase her risk of bleeding, however I think it is appropriate in this setting.  I will plan on a interval left carotid endarterectomy towards the end of this week or the following week.  It is okay for her to be discharged home and return for operation once this is been scheduled.     Jorge NyV. Wells Brabham IV, M.D. [facility] [place] Office:  [phone] Pager:  [phone]

## 2014-01-13 NOTE — Progress Notes (Signed)
STROKE TEAM PROGRESS NOTE   HISTORY Kathleen Figueroa is a 78 y.o. female with known risk factors of HTN, DM and hyperlipidemia. Patient states she had one episode 8 days ago during thanksgiving when she had right hand clumsiness. This resolved. She then had another episode two days ago with right hand and arm clumsiness. This did not resolve but she did not seek medical attention. 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 At present time she feels her symptoms have resolve.   Date last known well: Unable to determine Time last known well: Unable to determine tPA Given: No: no known LSN    SUBJECTIVE (INTERVAL HISTORY) No family members present. The patient is without complaints. Dr Myra Gianotti has seen the patient. Left carotid endarterectomy planned for later this week or early next week. Patient can return for surgery. Spoke with Dr Myra Gianotti - he is okay with Plavix as well as low-dose aspirin. Patient has a history of aspirin intolerance but no true allergy.   OBJECTIVE Temp:  [98.1 F (36.7 C)-99 F (37.2 C)] 98.3 F (36.8 C) (12/05 0100) Pulse Rate:  [65-96] 81 (12/05 0100) Cardiac Rhythm:  [-]  Resp:  [16-21] 17 (12/05 0100) BP: (101-192)/(58-94) 152/94 mmHg (12/05 0100) SpO2:  [96 %-100 %] 98 % (12/04 2100) Weight:  [178 lb (80.74 kg)-178 lb 1.6 oz (80.786 kg)] 178 lb (80.74 kg) (12/04 1405)   Recent Labs Lab 01/12/14 1037 01/12/14 1310 01/12/14 1625 01/12/14 2228 01/13/14 0651  GLUCAP 133* 127* 93 165* 137*    Recent Labs Lab 01/12/14 1045 01/12/14 1057  NA 139 137  K 4.2 3.8  CL 100 103  CO2 23  --   GLUCOSE 139* 143*  BUN 19 20  CREATININE 0.94 1.00  CALCIUM 9.1  --     Recent Labs Lab 01/12/14 1045  AST 18  ALT 10  ALKPHOS 95  BILITOT 0.4  PROT 7.1  ALBUMIN 3.5    Recent Labs Lab  01/12/14 1045 01/12/14 1057  WBC 4.9  --   NEUTROABS 2.5  --   HGB 14.0 15.6*  HCT 43.0 46.0  MCV 84.8  --   PLT 178  --    No results for input(s): CKTOTAL, CKMB, CKMBINDEX, TROPONINI in the last 168 hours.  Recent Labs  01/12/14 1045  LABPROT 13.1  INR 0.99    Recent Labs  01/12/14 1231  COLORURINE YELLOW  LABSPEC 1.020  PHURINE 6.5  GLUCOSEU NEGATIVE  HGBUR NEGATIVE  BILIRUBINUR NEGATIVE  KETONESUR 15*  PROTEINUR NEGATIVE  UROBILINOGEN 1.0  NITRITE NEGATIVE  LEUKOCYTESUR NEGATIVE    No results found for: CHOL, TRIG, HDL, CHOLHDL, VLDL, LDLCALC Lab Results  Component Value Date   HGBA1C 7.2* 01/12/2014      Component Value Date/Time   LABOPIA NONE DETECTED 01/12/2014 1231   COCAINSCRNUR NONE DETECTED 01/12/2014 1231   LABBENZ NONE DETECTED 01/12/2014 1231   AMPHETMU NONE DETECTED 01/12/2014 1231   THCU NONE DETECTED 01/12/2014 1231   LABBARB NONE DETECTED 01/12/2014 1231     Recent Labs Lab 01/12/14 1045  ETH <11    Carotid Dopplers 01/12/2014 Preliminary findings: Right ICA: 1-39% stenosis. Left ICA: 80-99% stenosis. Bilateral vertebral: patent with antegrade flow.   Ct Head Wo Contrast 01/12/2014     Age uncertain and possibly recent infarct at the left temporal -occipital junction.  There is atrophy with multiple prior small lacunar infarcts in the thalamic and basal ganglia regions as well as moderate small vessel disease in the centra semiovale. Mild generalized atrophy. No hemorrhage or mass effect.      Mr Brain Wo Contrast 01/12/2014    1. Multiple small foci of acute infarction in the left cerebral hemisphere, possibly watershed.  2. Chronic infarcts and moderate chronic small vessel ischemic disease.  Mr Maxine GlennMra Head/brain Wo Cm 01/12/2014    No evidence of major intracranial arterial occlusion or high-grade proximal intracranial stenosis. Decreased caliber of the distal left internal carotid artery may indicate a flow limiting stenosis more  proximally in the neck.     2D echo - pending  LDL 135 and A1C 7.2  PHYSICAL EXAM  Temp:  [98.1 F (36.7 C)-99 F (37.2 C)] 98.1 F (36.7 C) (12/05 1407) Pulse Rate:  [63-96] 71 (12/05 1407) Resp:  [17-21] 18 (12/05 1407) BP: (101-156)/(58-94) 137/66 mmHg (12/05 1407) SpO2:  [96 %-99 %] 97 % (12/05 1407)  General - Well nourished, well developed, in no apparent distress.  Ophthalmologic - Sharp disc margins OU.  Cardiovascular - Regular rate and rhythm with no murmur.  Mental Status -  Level of arousal and orientation to time, place, and person were intact. Language including expression, naming, repetition, comprehension, reading, and writing was assessed and found intact. Attention span and concentration were normal. Recent and remote memory were intact. Fund of Knowledge was assessed and was intact.  Cranial Nerves II - XII - II - Visual field intact OU. III, IV, VI - Extraocular movements intact. V - Facial sensation intact bilaterally. VII - Facial movement intact bilaterally. VIII - Hearing & vestibular intact bilaterally. X - Palate elevates symmetrically. XI - Chin turning & shoulder shrug intact bilaterally. XII - Tongue protrusion intact.  Motor Strength - The patient's strength was normal in all extremities and pronator drift was absent.  Bulk was normal and fasciculations were absent.   Motor Tone - Muscle tone was assessed at the neck and appendages and was normal.  Reflexes - The patient's reflexes were normal in all extremities and she had no pathological reflexes.  Sensory - Light touch, temperature/pinprick, vibration and proprioception, and Romberg testing were assessed and were normal.    Coordination - The patient had normal movements in the hands and feet with no ataxia or dysmetria.  Tremor was absent.  Gait and Station - The patient's transfers, posture, gait, station, and turns were observed as normal.   ASSESSMENT/PLAN Ms. [name] is  a 78 y.o. female with history of TIAs, diabetes mellitus, hyperlipidemia, and hypertension,  presenting with right upper extremity clumsiness and speech difficulties . She did not receive IV t-PA as the time that the patient was last seen normal cannot be determined.  Stroke:  Dominant multiple small foci of acute infarction in the left cerebral hemisphere.  Resultant  resolution of deficits  MRI  as above  MRA  as above  Carotid Doppler  Left ICA: 80-99% stenosis.   2D Echo  pending  LDL 135, not at goal  HgbA1c 7.2, not at goal  for VTE prophylaxis  Diet heart healthy/carb modified with thin liquids  No antithrombotics prior to admission, now on aspirin 325 mg orally every day. Will switch to dural antiplatelet with ASA 81mg  and Plavix 75mg  daily until left CEA procedure.  Patient counseled to be compliant with her antithrombotic medications  Ongoing aggressive stroke risk factor management  Therapy  recommendations:  No follow-up PT recommended.  Disposition:  Pending  Carotid stenosis - high grade on the left - vascular surgery consult - recommend left CEA within two weeks post stroke - continue dural antiplatelet until CEA  Hypertension  Home meds:  Norvasc 10 mg daily and Diovan 320 mg daily  Stable       Permissive hypertension (OK if <220/120) for 24-48 hours post stroke and then gradually normalized within 5-7 days.  Hyperlipidemia  Home meds:  Lipitor 20 mg daily resumed in hospital  LDL 135, not at goal < 70  Increase Lipitor to 40 mg daily.  Diabetes  HgbA1c 7.2 goal < 7.0  Uncontrolled  On SSI  May need to consider metformin or glipizide treatment as per primary team  Follow up closely with PCP  Other Stroke Risk Factors  Advanced age  Hx stroke/TIA   Hospital day # 1  Delton Seeavid Rinehuls PA-C [facility] Pager [phone] 01/13/2014, 8:24 AM  I, the attending vascular neurologist, have personally obtained a history,  examined the patient, evaluated laboratory data, individually viewed imaging studies. Together with the NP/PA, we formulated the assessment and plan of care which reflects our mutual decision.  I have made any additions or clarifications directly to the above note and agree with the findings and plan as currently documented.   Please note: All text in blue color in the note is my addition to the original note.   Neurology will sign off. Please call with questions. [facility]. Thanks for the consult.  Marvel PlanJindong Fayetta Sorenson, MD PhD Stroke Neurology 01/13/2014 3:46 PM   To contact Stroke Continuity provider, please refer to [email or web address]. After hours, contact General Neurology

## 2014-01-13 NOTE — Plan of Care (Signed)
Problem: Consults Goal: Ischemic Stroke Patient Education See Patient Education Module for education specifics.  Outcome: Completed/Met Date Met:  01/13/14 Goal: Skin Care Protocol Initiated - if Braden Score 18 or less If consults are not indicated, leave blank or document N/A  Outcome: Completed/Met Date Met:  01/13/14 Goal: Nutrition Consult-if indicated Outcome: Completed/Met Date Met:  01/13/14 Goal: Diabetes Guidelines if Diabetic/Glucose > 140 If diabetic or lab glucose is > 140 mg/dl - Initiate Diabetes/Hyperglycemia Guidelines & Document Interventions  Outcome: Completed/Met Date Met:  01/13/14

## 2014-01-13 NOTE — Progress Notes (Signed)
UR completed 

## 2014-01-13 NOTE — Progress Notes (Addendum)
[facility] PROGRESS NOTE  Kathleen Figueroa ZOX:[id number] DOB: 10-Oct-1935 DOA: 01/12/2014  PCP: Gaye Alken, MD  Brief HPI: 78 year old female with a prior history of TIA, hypertension, diabetes, hyperlipidemia, presented with right arm weakness and slurred speech. She was admitted for stroke workup.  Past medical history:  Past Medical History  Diagnosis Date  . TIA (transient ischemic attack)   . Diabetes mellitus without complication   . Hypertension     Consultants: Neurology, vascular surgery  Procedures:  Carotid Doppler Preliminary findings: Right ICA: 1-39% stenosis. Left ICA: 80-99% stenosis. Bilateral vertebral: patent with antegrade flow.   2-D Echocardiogram Report is pending due to EHR issues.  Antibiotics: None  Subjective: Patient feels her speech is back to normal. She also feels that her right arm strength is improved. She never had weakness in the right leg.  Objective: Vital Signs  Filed Vitals:   01/12/14 2100 01/12/14 2300 01/13/14 0000 01/13/14 0100  BP: 101/61 127/58  152/94  Pulse: 65 75  81  Temp: 98.4 F (36.9 C)   98.3 F (36.8 C)  TempSrc: Oral   Oral  Resp: 20 19 21 17   Height:      Weight:      SpO2: 98%      No intake or output data in the 24 hours ending 01/13/14 0844 Filed Weights   01/12/14 1255  Weight: 80.786 kg (178 lb 1.6 oz)    General appearance: alert, cooperative, appears stated age and no distress Resp: clear to auscultation bilaterally Cardio: regular rate and rhythm, S1, S2 normal, no murmur, click, rub or gallop GI: soft, non-tender; bowel sounds normal; no masses,  no organomegaly Extremities: extremities normal, atraumatic, no cyanosis or edema Neurologic: She is alert and oriented 3. No facial asymmetry. Tongue is midline. Motor strength is 5-5 in the left upper extremity. Between 4 and 5 out of 5 in the right upper extremity. Normal strength and equal in both the lower extremities. No  pronator drip. No dysdiadochokinesis.  Lab Results:  Basic Metabolic Panel:  Recent Labs Lab 01/12/14 1045 01/12/14 1057  NA 139 137  K 4.2 3.8  CL 100 103  CO2 23  --   GLUCOSE 139* 143*  BUN 19 20  CREATININE 0.94 1.00  CALCIUM 9.1  --    Liver Function Tests:  Recent Labs Lab 01/12/14 1045  AST 18  ALT 10  ALKPHOS 95  BILITOT 0.4  PROT 7.1  ALBUMIN 3.5   CBC:  Recent Labs Lab 01/12/14 1045 01/12/14 1057  WBC 4.9  --   NEUTROABS 2.5  --   HGB 14.0 15.6*  HCT 43.0 46.0  MCV 84.8  --   PLT 178  --    CBG:  Recent Labs Lab 01/12/14 1037 01/12/14 1310 01/12/14 1625 01/12/14 2228 01/13/14 0651  GLUCAP 133* 127* 93 165* 137*     Studies/Results: Ct Head Wo Contrast  01/12/2014   CLINICAL DATA:  Two day history of right arm weakness and vision difficulty; 1 day history of headache  EXAM: CT HEAD WITHOUT CONTRAST  TECHNIQUE: Contiguous axial images were obtained from the base of the skull through the vertex without intravenous contrast.  COMPARISON:  None.  FINDINGS: There is mild generalized atrophy. There is no intracranial mass, hemorrhage, extra-axial fluid collection, or midline shift. There is an age uncertain infarct at the left temporal -occipital junction. There is patchy small vessel disease in the centra semiovale bilaterally. There are small chronic appearing lacunar infarcts  scattered throughout both basal ganglia regions as well as in both thalami. The bony calvarium appears intact. The mastoid air cells are clear. Cataract formation is noted in the right eye.  IMPRESSION: Age uncertain and possibly recent infarct at the left temporal -occipital junction. There is atrophy with multiple prior small lacunar infarcts in the thalamic and basal ganglia regions as well as moderate small vessel disease in the centra semiovale. Mild generalized atrophy. No hemorrhage or mass effect.   Electronically Signed   By: Bretta BangWilliam  Woodruff M.D.   On: 01/12/2014 11:04     Mr Brain Wo Contrast  01/12/2014   CLINICAL DATA:  Right arm weakness.  EXAM: MRI HEAD WITHOUT CONTRAST  MRA HEAD WITHOUT CONTRAST  TECHNIQUE: Multiplanar, multiecho pulse sequences of the brain and surrounding structures were obtained without intravenous contrast. Angiographic images of the head were obtained using MRA technique without contrast.  COMPARISON:  Head CT 01/12/2014  FINDINGS: MRI HEAD FINDINGS  There are numerous small foci of restricted diffusion in the left cerebral hemisphere, largely cortical but with some subcortical white matter foci as well consistent with acute infarcts. Many of the areas of infarction are in the left frontal and parietal lobes near the vertex, although there are some more slightly confluent regions in the posterior left parietal lobe and lateral left occipital lobe.  Scattered foci of chronic microhemorrhage are noted in both cerebral hemispheres as well as thalami and pons. There is an old left parieto-occipital cortical infarct with associated chronic blood products. Small, chronic infarcts are also present in the basal ganglia and thalami bilaterally and in the pons. Patchy and confluent T2 hyperintensities throughout the subcortical and deep cerebral white matter are nonspecific but compatible with moderate chronic small vessel ischemic disease. There is mild-to-moderate cerebral atrophy. There is no mass, midline shift, or extra-axial fluid collection.  Prior right cataract extraction is noted. Paranasal sinuses and mastoid air cells are clear. Major intracranial vascular flow voids are preserved.  MRA HEAD FINDINGS  Images are mildly degraded by motion artifact. Visualized distal vertebral arteries are patent with mild narrowing of both vertebral arteries just proximal to the vertebrobasilar junction. Left PICA origin is patent. AICA origins are patent. There is mild, diffuse irregularity of the basilar artery with mild stenosis versus artifact distally. SCA  origins are patent. There is a fetal type origin of the right PCA. Left P1 segment is patent without stenosis. There is a mild left P2 stenosis. Mild PCA branch vessel irregularity is present bilaterally.  The visualized distal cervical internal carotid arteries are tortuous. Both internal carotid arteries are patent to carotid termini. The visualized left ICA is mildly small in caliber diffusely compared to the right which may indicate a proximal stenosis in the neck. There is mild atherosclerotic irregularity of the carotid siphons. The left A1 segment may be congenitally absent. Left A2 is supplied via the anterior communicating artery. ACAs are otherwise unremarkable aside from mild branch vessel irregularity. MCAs are unremarkable aside from mild branch vessel irregularity. No intracranial aneurysm is identified.  IMPRESSION: 1. Multiple small foci of acute infarction in the left cerebral hemisphere, possibly watershed. 2. Chronic infarcts and moderate chronic small vessel ischemic disease as above. 3. No evidence of major intracranial arterial occlusion or high-grade proximal intracranial stenosis. Decreased caliber of the distal left internal carotid artery may indicate a flow limiting stenosis more proximally in the neck.   Electronically Signed   By: Sebastian AcheAllen  Grady   On: 01/12/2014 15:15  Mr Maxine Glenn Head/brain Wo Cm  01/12/2014   CLINICAL DATA:  Right arm weakness.  EXAM: MRI HEAD WITHOUT CONTRAST  MRA HEAD WITHOUT CONTRAST  TECHNIQUE: Multiplanar, multiecho pulse sequences of the brain and surrounding structures were obtained without intravenous contrast. Angiographic images of the head were obtained using MRA technique without contrast.  COMPARISON:  Head CT 01/12/2014  FINDINGS: MRI HEAD FINDINGS  There are numerous small foci of restricted diffusion in the left cerebral hemisphere, largely cortical but with some subcortical white matter foci as well consistent with acute infarcts. Many of the areas of  infarction are in the left frontal and parietal lobes near the vertex, although there are some more slightly confluent regions in the posterior left parietal lobe and lateral left occipital lobe.  Scattered foci of chronic microhemorrhage are noted in both cerebral hemispheres as well as thalami and pons. There is an old left parieto-occipital cortical infarct with associated chronic blood products. Small, chronic infarcts are also present in the basal ganglia and thalami bilaterally and in the pons. Patchy and confluent T2 hyperintensities throughout the subcortical and deep cerebral white matter are nonspecific but compatible with moderate chronic small vessel ischemic disease. There is mild-to-moderate cerebral atrophy. There is no mass, midline shift, or extra-axial fluid collection.  Prior right cataract extraction is noted. Paranasal sinuses and mastoid air cells are clear. Major intracranial vascular flow voids are preserved.  MRA HEAD FINDINGS  Images are mildly degraded by motion artifact. Visualized distal vertebral arteries are patent with mild narrowing of both vertebral arteries just proximal to the vertebrobasilar junction. Left PICA origin is patent. AICA origins are patent. There is mild, diffuse irregularity of the basilar artery with mild stenosis versus artifact distally. SCA origins are patent. There is a fetal type origin of the right PCA. Left P1 segment is patent without stenosis. There is a mild left P2 stenosis. Mild PCA branch vessel irregularity is present bilaterally.  The visualized distal cervical internal carotid arteries are tortuous. Both internal carotid arteries are patent to carotid termini. The visualized left ICA is mildly small in caliber diffusely compared to the right which may indicate a proximal stenosis in the neck. There is mild atherosclerotic irregularity of the carotid siphons. The left A1 segment may be congenitally absent. Left A2 is supplied via the anterior  communicating artery. ACAs are otherwise unremarkable aside from mild branch vessel irregularity. MCAs are unremarkable aside from mild branch vessel irregularity. No intracranial aneurysm is identified.  IMPRESSION: 1. Multiple small foci of acute infarction in the left cerebral hemisphere, possibly watershed. 2. Chronic infarcts and moderate chronic small vessel ischemic disease as above. 3. No evidence of major intracranial arterial occlusion or high-grade proximal intracranial stenosis. Decreased caliber of the distal left internal carotid artery may indicate a flow limiting stenosis more proximally in the neck.   Electronically Signed   By: Sebastian Ache   On: 01/12/2014 15:15    Medications:  Scheduled: .  stroke: mapping our early stages of recovery book   Does not apply Once  . aspirin EC  81 mg Oral Daily  . atorvastatin  20 mg Oral Daily  . clopidogrel  75 mg Oral Daily  . heparin  5,000 Units Subcutaneous 3 times per day  . insulin aspart  0-5 Units Subcutaneous QHS  . insulin aspart  0-9 Units Subcutaneous TID WC   Continuous: . sodium chloride 75 mL/hr at 01/12/14 1702   RUE:[id number] **OR** acetaminophen, hydrALAZINE  Assessment/Plan:  Principal Problem:  Right arm weakness Active Problems:   Diabetes mellitus without complication   Hypertension   TIA (transient ischemic attack)   CVA (cerebral infarction)   Hyperlipidemia    Acute stroke involving left MCA territory Patient was placed initially on aspirin. Neurology is following and considering initiating Plavix as well. She is already initiated on a statin. LDL is 135. PTOT consultations are pending. Echocardiogram report is pending. Doppler study shows significant stenosis in the left ICA. Vascular surgery has been consulted. All of the above was discussed with the patient and her daughter.  Type 2 Diabetes mellitus without complication Holding metformin. Continue sliding scale coverage. HbA1c is  7.2.  Essential Hypertension Holding antihypertensives. Permissive hypertension being allowed.   DVT Prophylaxis: Heparin    Code Status: Full code  Family Communication: Discussed with the patient and her daughter  Disposition Plan: Not ready for discharge.    LOS: 1 day   [facility] HospitalKRISHNAN,Kaimen Peine  [facility] Pager [phone] 01/13/2014, 8:44 AM  If 8PM-8AM, please contact night-coverage at [email or web address], password [email or web address]

## 2014-01-13 NOTE — Evaluation (Signed)
Physical Therapy Evaluation/Discharge Patient Details Name: Kathleen Figueroa MRN: [id number] DOB: [birth year] Today's Date: 01/13/2014   History of Present Illness  78 y.o. female admitted [facility]/14 due to clumsiness, HA, and slurred speech.  Stroke workup initiated.  MRI revealed Multiple small foci of acute infarction in the left cerebral hemisphere, possibly watershed. Chronic infarcts and moderate chronic small vessel ischemic disease.  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 Pt with significant PMHx of TIA, DM, HTN, and eye surgery.  Clinical Impression  Pt is independent with all mobility, no signs of focal weakness.  Some numbness reported in bil hands to LT.  Pt has no current or f/u PT needs at this time.  PT to sign off.     Follow Up Recommendations No PT follow up    Equipment Recommendations  None recommended by PT    Recommendations for Other Services   NA    Precautions / Restrictions Precautions Precautions: None      Mobility  Bed Mobility Overal bed mobility: Independent                Transfers Overall transfer level: Independent                  Ambulation/Gait Ambulation/Gait assistance: Independent Ambulation Distance (Feet): 250 Feet Assistive device: None Gait Pattern/deviations: WFL(Within Functional Limits)        Stairs Stairs: Yes Stairs assistance: Modified independent (Device/Increase time) Stair Management: One rail Left;Alternating pattern;Forwards Number of Stairs: 9 General stair comments: no difficulty  Wheelchair Mobility    Modified Rankin (Stroke Patients Only) Modified Rankin (Stroke Patients Only) Pre-Morbid Rankin Score: No symptoms Modified Rankin: No significant disability     Balance Overall balance assessment: No apparent balance deficits (not formally assessed)                               Standardized Balance Assessment Standardized  Balance Assessment : Dynamic Gait Index   Dynamic Gait Index Level Surface: Normal Change in Gait Speed: Normal Gait with Horizontal Head Turns: Normal Gait with Vertical Head Turns: Normal Gait and Pivot Turn: Normal Step Over Obstacle: Normal Step Around Obstacles: Normal Steps: Normal Total Score: 24       Pertinent Vitals/Pain Pain Assessment: No/denies pain    Home Living Family/patient expects to be discharged to:: Private residence Living Arrangements: Children (daughter) Available Help at Discharge: Family;Available PRN/intermittently (daughter works) Type of Home: House Home Access: Stairs to enter   Secretary/administratorntrance Stairs-Number of Steps: 3 Home Layout: Two level Home Equipment: None      Prior Function Level of Independence: Independent         Comments: still drives     Hand Dominance   Dominant Hand: Right    Extremity/Trunk Assessment   Upper Extremity Assessment: Overall WFL for tasks assessed (coordination testing equal bil, sensation dec to LT bil hand)           Lower Extremity Assessment: Overall WFL for tasks assessed (5/5 strength, good sensation, good coordination)      Cervical / Trunk Assessment: Normal  Communication   Communication: HOH (mildly)  Cognition Arousal/Alertness: Awake/alert Behavior During Therapy: Flat affect Overall Cognitive Status: Within Functional Limits for tasks assessed (not specifically tested, but hx and conversation seemed WNL)  General Comments      Exercises        Assessment/Plan    PT Assessment Patent does not need any further PT services  PT Diagnosis Difficulty walking;Abnormality of gait;Generalized weakness   PT Problem List    PT Treatment Interventions     PT Goals (Current goals can be found in the Care Plan section) Acute Rehab PT Goals PT Goal Formulation: All assessment and education complete, DC therapy    Frequency     Barriers to discharge         Co-evaluation               End of Session   Activity Tolerance: Patient tolerated treatment well Patient left: in chair;with call bell/phone within reach Nurse Communication: Mobility status    Functional Assessment Tool Used: assist level Functional Limitation: Mobility: Walking and moving around Mobility: Walking and Moving Around Current Status ([id number](G8978): 0 percent impaired, limited or restricted Mobility: Walking and Moving Around Goal Status [phone](G8979): 0 percent impaired, limited or restricted Mobility: Walking and Moving Around Discharge Status [phone](G8980): 0 percent impaired, limited or restricted    Time: [id number] PT Time Calculation (min) (ACUTE ONLY): 10 min   Charges:   PT Evaluation $Initial PT Evaluation Tier I: 1 Procedure     PT G Codes:   Functional Assessment Tool Used: assist level Functional Limitation: Mobility: Walking and moving around    [place]becca B. Vernell Townley, PT, DPT [phone]   01/13/2014, 11:43 AM

## 2014-01-14 DIAGNOSIS — I779 Disorder of arteries and arterioles, unspecified: Secondary | ICD-10-CM | POA: Diagnosis present

## 2014-01-14 DIAGNOSIS — I739 Peripheral vascular disease, unspecified: Secondary | ICD-10-CM

## 2014-01-14 LAB — BASIC METABOLIC PANEL
Anion gap: 11 (ref 5–15)
BUN: 15 mg/dL (ref 6–23)
CO2: 28 mEq/L (ref 19–32)
CREATININE: 0.98 mg/dL (ref 0.50–1.10)
Calcium: 9.1 mg/dL (ref 8.4–10.5)
Chloride: 102 mEq/L (ref 96–112)
GFR calc Af Amer: 63 mL/min — ABNORMAL LOW (ref >90)
GFR calc non Af Amer: 55 mL/min — ABNORMAL LOW (ref >90)
GLUCOSE: 138 mg/dL — AB (ref 70–99)
POTASSIUM: 4.9 meq/L (ref 3.7–5.3)
Sodium: 141 mEq/L (ref 137–147)

## 2014-01-14 LAB — CBC
HCT: 42.1 % (ref 36.0–46.0)
HEMOGLOBIN: 13.4 g/dL (ref 12.0–15.0)
MCH: 27.1 pg (ref 26.0–34.0)
MCHC: 31.8 g/dL (ref 30.0–36.0)
MCV: 85.1 fL (ref 78.0–100.0)
Platelets: 179 10*3/uL (ref 150–400)
RBC: 4.95 MIL/uL (ref 3.87–5.11)
RDW: 14 % (ref 11.5–15.5)
WBC: 3.5 10*3/uL — AB (ref 4.0–10.5)

## 2014-01-14 LAB — GLUCOSE, CAPILLARY
Glucose-Capillary: 150 mg/dL — ABNORMAL HIGH (ref 70–99)
Glucose-Capillary: 187 mg/dL — ABNORMAL HIGH (ref 70–99)

## 2014-01-14 MED ORDER — CLOPIDOGREL BISULFATE 75 MG PO TABS
75.0000 mg | ORAL_TABLET | Freq: Every day | ORAL | Status: DC
Start: 2014-01-14 — End: 2014-02-13

## 2014-01-14 MED ORDER — ATORVASTATIN CALCIUM 40 MG PO TABS: 40.0000 mg | ORAL_TABLET | Freq: Every day | ORAL | Status: DC

## 2014-01-14 MED ORDER — ASPIRIN 81 MG PO TBEC: 81.0000 mg | DELAYED_RELEASE_TABLET | Freq: Every day | ORAL | Status: DC

## 2014-01-14 NOTE — Plan of Care (Signed)
Problem: Acute Treatment Outcomes Goal: Neuro exam at baseline or improved Outcome: Completed/Met Date Met:  01/14/14 Goal: Airway maintained/protected Outcome: Completed/Met Date Met:  01/14/14 Goal: 02 Sats > 94% Outcome: Completed/Met Date Met:  01/14/14 Goal: Hemodynamically stable Outcome: Completed/Met Date Met:  01/14/14 Goal: tPA Patient w/o S&S of bleeding Outcome: Not Applicable Date Met:  84/69/62 Goal: Prognosis discussed with family/patient as appropriate Outcome: Completed/Met Date Met:  01/14/14  Problem: Progression Outcomes Goal: Communication method established Outcome: Completed/Met Date Met:  01/14/14 Goal: If vent dependent, tolerates weaning Outcome: Not Applicable Date Met:  95/28/41 Goal: Rehab Team goals identified Outcome: Completed/Met Date Met:  01/14/14 Goal: Progressive activity as tolerated Outcome: Completed/Met Date Met:  01/14/14 Goal: Tolerating diet/TF at goal rate Outcome: Completed/Met Date Met:  01/14/14 Goal: Pain controlled Outcome: Completed/Met Date Met:  01/14/14 Goal: Bowel & Bladder Continence Outcome: Completed/Met Date Met:  01/14/14 Goal: Educational plan initiated Outcome: Completed/Met Date Met:  01/14/14 Goal: Initial discharge plan initiated Outcome: Completed/Met Date Met:  01/14/14

## 2014-01-14 NOTE — Discharge Summary (Addendum)
[facility]  Physician Discharge Summary   Patient ID: Kathleen Figueroa MRN: [id number] DOB/AGE: 10-08-1935 78 y.o.  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 Echocardiogram report is pending. 2. Patient to have carotid endarterectomy either later this week or early next week.  DISCHARGE CONDITION: fair  Diet recommendation: Mod Carb  Filed Weights   01/12/14 1255  Weight: 80.786 kg (178 lb 1.6 oz)    INITIAL HISTORY: 78 year old female with a prior history of TIA, hypertension, diabetes, hyperlipidemia, presented with right arm weakness and slurred speech. She was admitted for stroke workup.  Consultants: Neurology, vascular surgery  Procedures:  Carotid Doppler Preliminary findings: Right ICA: 1-39% stenosis. Left ICA: 80-99% stenosis. Bilateral vertebral: patent with antegrade flow.   2-D Echocardiogram Report is pending due to EHR issues.   HOSPITAL COURSE:   Acute stroke involving left MCA territory Patient was placed initially on aspirin. Neurology was following. She was initiated on Plavix as well. She is already initiated on a statin. LDL is 135. PT evaluated patient and was not thought to require further follow up. Echocardiogram report is pending due to technical issues. Doppler study shows significant stenosis in the left ICA. Vascular surgery has been consulted. All of the above was discussed with the patient and her daughter.  Left Carotid Artery Disease Vascular surgery has seen patient. Dr. Myra GianottiBrabham plans to operate later this week or  early next week. He is ok with her being on Plavix and discharged.  Type 2 Diabetes mellitus without complication HbA1c is 7.2. She may resume her home medications.  Essential Hypertension Permissive hypertension was allowed in the hospital. She can resume her home medications.  Overall she is stable and ok for discharge. She was told that her ECHO report is not available at this time. This will need to be followed as outpatient. Discussed with neurology as well.   PERTINENT LABS:  The results of significant diagnostics from this hospitalization (including imaging, microbiology, ancillary and laboratory) are listed below for reference.     Labs: Basic Metabolic Panel:  Recent Labs Lab 01/12/14 1045 01/12/14 1057 01/14/14 0537  NA 139 137 141  K 4.2 3.8 4.9  CL 100 103 102  CO2 23  --  28  GLUCOSE 139* 143* 138*  BUN 19 20 15   CREATININE 0.94 1.00 0.98  CALCIUM 9.1  --  9.1   Liver Function Tests:  Recent Labs Lab 01/12/14 1045  AST 18  ALT 10  ALKPHOS 95  BILITOT 0.4  PROT 7.1  ALBUMIN 3.5   CBC:  Recent Labs Lab 01/12/14 1045 01/12/14 1057 01/14/14 0537  WBC 4.9  --  3.5*  NEUTROABS 2.5  --   --   HGB 14.0 15.6* 13.4  HCT 43.0 46.0 42.1  MCV 84.8  --  85.1  PLT 178  --  179   CBG:  Recent Labs Lab 01/13/14 0651 01/13/14 1111 01/13/14 1616 01/13/14 2200 01/14/14 0648  GLUCAP 137* 123* 95 138* 150*     IMAGING STUDIES Ct Head Wo Contrast  01/12/2014   CLINICAL DATA:  Two day history of right  arm weakness and vision difficulty; 1 day history of headache  EXAM: CT HEAD WITHOUT CONTRAST  TECHNIQUE: Contiguous axial images were obtained from the base of the skull through the vertex without intravenous contrast.  COMPARISON:  None.  FINDINGS: There is mild generalized atrophy. There is no intracranial mass, hemorrhage, extra-axial fluid collection, or midline shift. There is an age uncertain infarct at the left temporal -occipital junction. There is  patchy small vessel disease in the centra semiovale bilaterally. There are small chronic appearing lacunar infarcts scattered throughout both basal ganglia regions as well as in both thalami. The bony calvarium appears intact. The mastoid air cells are clear. Cataract formation is noted in the right eye.  IMPRESSION: Age uncertain and possibly recent infarct at the left temporal -occipital junction. There is atrophy with multiple prior small lacunar infarcts in the thalamic and basal ganglia regions as well as moderate small vessel disease in the centra semiovale. Mild generalized atrophy. No hemorrhage or mass effect.   Electronically Signed   By: Bretta BangWilliam  Woodruff M.D.   On: 01/12/2014 11:04   Mr Brain Wo Contrast  01/12/2014   CLINICAL DATA:  Right arm weakness.  EXAM: MRI HEAD WITHOUT CONTRAST  MRA HEAD WITHOUT CONTRAST  TECHNIQUE: Multiplanar, multiecho pulse sequences of the brain and surrounding structures were obtained without intravenous contrast. Angiographic images of the head were obtained using MRA technique without contrast.  COMPARISON:  Head CT 01/12/2014  FINDINGS: MRI HEAD FINDINGS  There are numerous small foci of restricted diffusion in the left cerebral hemisphere, largely cortical but with some subcortical white matter foci as well consistent with acute infarcts. Many of the areas of infarction are in the left frontal and parietal lobes near the vertex, although there are some more slightly confluent regions in the posterior left parietal lobe and lateral left occipital lobe.  Scattered foci of chronic microhemorrhage are noted in both cerebral hemispheres as well as thalami and pons. There is an old left parieto-occipital cortical infarct with associated chronic blood products. Small, chronic infarcts are also present in the basal ganglia and thalami bilaterally and in the pons. Patchy and confluent T2 hyperintensities throughout the subcortical and deep cerebral white matter are  nonspecific but compatible with moderate chronic small vessel ischemic disease. There is mild-to-moderate cerebral atrophy. There is no mass, midline shift, or extra-axial fluid collection.  Prior right cataract extraction is noted. Paranasal sinuses and mastoid air cells are clear. Major intracranial vascular flow voids are preserved.  MRA HEAD FINDINGS  Images are mildly degraded by motion artifact. Visualized distal vertebral arteries are patent with mild narrowing of both vertebral arteries just proximal to the vertebrobasilar junction. Left PICA origin is patent. AICA origins are patent. There is mild, diffuse irregularity of the basilar artery with mild stenosis versus artifact distally. SCA origins are patent. There is a fetal type origin of the right PCA. Left P1 segment is patent without stenosis. There is a mild left P2 stenosis. Mild PCA branch vessel irregularity is present bilaterally.  The visualized distal cervical internal carotid arteries are tortuous. Both internal carotid arteries are patent to carotid termini. The visualized left ICA is mildly small in caliber diffusely compared to the right which may indicate a proximal stenosis in the neck. There is mild atherosclerotic irregularity of the carotid siphons. The left A1 segment may be congenitally absent. Left A2 is supplied via the anterior communicating artery. ACAs are otherwise unremarkable aside from mild branch vessel irregularity. MCAs are unremarkable aside  from mild branch vessel irregularity. No intracranial aneurysm is identified.  IMPRESSION: 1. Multiple small foci of acute infarction in the left cerebral hemisphere, possibly watershed. 2. Chronic infarcts and moderate chronic small vessel ischemic disease as above. 3. No evidence of major intracranial arterial occlusion or high-grade proximal intracranial stenosis. Decreased caliber of the distal left internal carotid artery may indicate a flow limiting stenosis more proximally in  the neck.   Electronically Signed   By: Sebastian Ache   On: 01/12/2014 15:15   Mr Maxine Glenn Head/brain Wo Cm  01/12/2014   CLINICAL DATA:  Right arm weakness.  EXAM: MRI HEAD WITHOUT CONTRAST  MRA HEAD WITHOUT CONTRAST  TECHNIQUE: Multiplanar, multiecho pulse sequences of the brain and surrounding structures were obtained without intravenous contrast. Angiographic images of the head were obtained using MRA technique without contrast.  COMPARISON:  Head CT 01/12/2014  FINDINGS: MRI HEAD FINDINGS  There are numerous small foci of restricted diffusion in the left cerebral hemisphere, largely cortical but with some subcortical white matter foci as well consistent with acute infarcts. Many of the areas of infarction are in the left frontal and parietal lobes near the vertex, although there are some more slightly confluent regions in the posterior left parietal lobe and lateral left occipital lobe.  Scattered foci of chronic microhemorrhage are noted in both cerebral hemispheres as well as thalami and pons. There is an old left parieto-occipital cortical infarct with associated chronic blood products. Small, chronic infarcts are also present in the basal ganglia and thalami bilaterally and in the pons. Patchy and confluent T2 hyperintensities throughout the subcortical and deep cerebral white matter are nonspecific but compatible with moderate chronic small vessel ischemic disease. There is mild-to-moderate cerebral atrophy. There is no mass, midline shift, or extra-axial fluid collection.  Prior right cataract extraction is noted. Paranasal sinuses and mastoid air cells are clear. Major intracranial vascular flow voids are preserved.  MRA HEAD FINDINGS  Images are mildly degraded by motion artifact. Visualized distal vertebral arteries are patent with mild narrowing of both vertebral arteries just proximal to the vertebrobasilar junction. Left PICA origin is patent. AICA origins are patent. There is mild, diffuse  irregularity of the basilar artery with mild stenosis versus artifact distally. SCA origins are patent. There is a fetal type origin of the right PCA. Left P1 segment is patent without stenosis. There is a mild left P2 stenosis. Mild PCA branch vessel irregularity is present bilaterally.  The visualized distal cervical internal carotid arteries are tortuous. Both internal carotid arteries are patent to carotid termini. The visualized left ICA is mildly small in caliber diffusely compared to the right which may indicate a proximal stenosis in the neck. There is mild atherosclerotic irregularity of the carotid siphons. The left A1 segment may be congenitally absent. Left A2 is supplied via the anterior communicating artery. ACAs are otherwise unremarkable aside from mild branch vessel irregularity. MCAs are unremarkable aside from mild branch vessel irregularity. No intracranial aneurysm is identified.  IMPRESSION: 1. Multiple small foci of acute infarction in the left cerebral hemisphere, possibly watershed. 2. Chronic infarcts and moderate chronic small vessel ischemic disease as above. 3. No evidence of major intracranial arterial occlusion or high-grade proximal intracranial stenosis. Decreased caliber of the distal left internal carotid artery may indicate a flow limiting stenosis more proximally in the neck.   Electronically Signed   By: Sebastian Ache   On: 01/12/2014 15:15    DISCHARGE EXAMINATION: Filed Vitals:   01/14/14 0216  01/14/14 0219 01/14/14 0619 01/14/14 1026  BP: 174/108 154/60 149/60 121/91  Pulse: 73  67 70  Temp: 98.2 F (36.8 C)  98.2 F (36.8 C) 99.1 F (37.3 C)  TempSrc: Oral  Oral Oral  Resp: 18  16 16   Height:      Weight:      SpO2: 99%  96% 97%   General appearance: alert, cooperative, appears stated age and no distress Resp: clear to auscultation bilaterally Cardio: regular rate and rhythm, S1, S2 normal, no murmur, click, rub or gallop GI: soft, non-tender; bowel  sounds normal; no masses,  no organomegaly  DISPOSITION: Home with daughter  Discharge Instructions    Ambulatory referral to Neurology    Complete by:  As directed   Pt will follow up with Dr. Roda Shutters at [facility] in about 2 months.     Call MD for:  difficulty breathing, headache or visual disturbances    Complete by:  As directed      Call MD for:  extreme fatigue    Complete by:  As directed      Call MD for:  persistant dizziness or light-headedness    Complete by:  As directed      Call MD for:  persistant nausea and vomiting    Complete by:  As directed      Diet - low sodium heart healthy    Complete by:  As directed      Diet Carb Modified    Complete by:  As directed      Discharge instructions    Complete by:  As directed   No driving till cleared by Neurology.     Increase activity slowly    Complete by:  As directed            ALLERGIES:  Allergies  Allergen Reactions  . Aspirin Nausea Only    Current Discharge Medication List    START taking these medications   Details  aspirin EC 81 MG EC tablet Take 1 tablet (81 mg total) by mouth daily. Qty: 30 tablet, Refills: 3   Associated Diagnoses: CVA (cerebral vascular accident)    clopidogrel (PLAVIX) 75 MG tablet Take 1 tablet (75 mg total) by mouth daily. Qty: 30 tablet, Refills: 3   Associated Diagnoses: CVA (cerebral vascular accident)      CONTINUE these medications which have CHANGED   Details  atorvastatin (LIPITOR) 40 MG tablet Take 1 tablet (40 mg total) by mouth daily. Qty: 30 tablet, Refills: 2      CONTINUE these medications which have NOT CHANGED   Details  amLODipine (NORVASC) 10 MG tablet Take 10 mg by mouth daily.    metFORMIN (GLUCOPHAGE) 1000 MG tablet Take 1,000 mg by mouth daily with breakfast.    valsartan (DIOVAN) 320 MG tablet Take 320 mg by mouth daily.       Follow-up Information    Follow up with Gaye Alken, MD. Schedule an appointment as soon as possible for a  visit in 1 week.   Specialty:  Family Medicine   Why:  post hospitalization follow up   Contact information:   [address] [place] [address]       Follow up with Xu,Jindong, MD In 1 month.   Specialty:  Neurology   Why:  stroke follow up   Contact information:   [address] [place] [address] [phone]       Follow up with BRABHAM IV, V.  Anner Crete, MD.   Specialty:  Vascular Surgery   Why:  his office will call to schedule surgery   Contact information:   [address] [place] [address] [phone]       TOTAL DISCHARGE TIME: 35 min.  [facility]  [facility] Pager [phone]  01/14/2014, 11:13 AM

## 2014-01-14 NOTE — Progress Notes (Signed)
Pt d/c to home by car with family. Assessment stable. Prescriptions given. Pt verbalizes understanding of d/c instructions. 

## 2014-01-14 NOTE — Discharge Instructions (Signed)
Ischemic Stroke °A stroke (cerebrovascular accident) is the sudden death of brain tissue. It is a medical emergency. A stroke can cause permanent loss of brain function. This can cause problems with different parts of your body. A transient ischemic attack (TIA) is different because it does not cause permanent damage. A TIA is a short-lived problem of poor blood flow affecting a part of the brain. A TIA is also a serious problem because having a TIA greatly increases the chances of having a stroke. When symptoms first develop, you cannot know if the problem might be a stroke or a TIA. °CAUSES  °A stroke is caused by a decrease of oxygen supply to an area of your brain. It is usually the result of a small blood clot or collection of cholesterol or fat (plaque) that blocks blood flow in the brain. A stroke can also be caused by blocked or damaged carotid arteries.  °RISK FACTORS °· High blood pressure (hypertension). °· High cholesterol. °· Diabetes mellitus. °· Heart disease. °· The buildup of plaque in the blood vessels (peripheral artery disease or atherosclerosis). °· The buildup of plaque in the blood vessels providing blood and oxygen to the brain (carotid artery stenosis). °· An abnormal heart rhythm (atrial fibrillation). °· Obesity. °· Smoking. °· Taking oral contraceptives (especially in combination with smoking). °· Physical inactivity. °· A diet high in fats, salt (sodium), and calories. °· Alcohol use. °· Use of illegal drugs (especially cocaine and methamphetamine). °· Being African American. °· Being over the age of 55. °· Family history of stroke. °· Previous history of blood clots, stroke, TIA, or heart attack. °· Sickle cell disease. °SYMPTOMS  °These symptoms usually develop suddenly, or may be newly present upon awakening from sleep: °· Sudden weakness or numbness of the face, arm, or leg, especially on one side of the body. °· Sudden trouble walking or difficulty moving arms or legs. °· Sudden  confusion. °· Sudden personality changes. °· Trouble speaking (aphasia) or understanding. °· Difficulty swallowing. °· Sudden trouble seeing in one or both eyes. °· Double vision. °· Dizziness. °· Loss of balance or coordination. °· Sudden severe headache with no known cause. °· Trouble reading or writing. °DIAGNOSIS  °Your health care provider can often determine the presence or absence of a stroke based on your symptoms, history, and physical exam. Computed tomography (CT) of the brain is usually performed to confirm the stroke, determine causes, and determine stroke severity. Other tests may be done to find the cause of the stroke. These tests may include: °· Electrocardiography. °· Continuous heart monitoring. °· Echocardiography. °· Carotid ultrasonography. °· Magnetic resonance imaging (MRI). °· A scan of the brain circulation. °· Blood tests. °PREVENTION  °The risk of a stroke can be decreased by appropriately treating high blood pressure, high cholesterol, diabetes, heart disease, and obesity and by quitting smoking, limiting alcohol, and staying physically active. °TREATMENT  °Time is of the essence. It is important to seek treatment at the first sign of these symptoms because you may receive a medicine to dissolve the clot (thrombolytic) that cannot be given if too much time has passed since your symptoms began. Even if you do not know when your symptoms began, get treatment as soon as possible as there are other treatment options available including oxygen, intravenous (IV) fluids, and medicines to thin the blood (anticoagulants). Treatment of stroke depends on the duration, severity, and cause of your symptoms. Medicines and dietary changes may be used to address diabetes, high blood   pressure, and other risk factors. Physical, speech, and occupational therapists will assess you and work with you to improve any functions impaired by the stroke. Measures will be taken to prevent short-term and long-term  complications, including infection from breathing foreign material into the lungs (aspiration pneumonia), blood clots in the legs, bedsores, and falls. Rarely, surgery may be needed to remove large blood clots or to open up blocked arteries. °HOME CARE INSTRUCTIONS  °· Take medicines only as directed by your health care provider. Follow the directions carefully. Medicines may be used to control risk factors for a stroke. Be sure you understand all your medicine instructions. °· You may be told to take a medicine to thin the blood, such as aspirin or the anticoagulant warfarin. Warfarin needs to be taken exactly as instructed. °¨ Too much and too little warfarin are both dangerous. Too much warfarin increases the risk of bleeding. Too little warfarin continues to allow the risk for blood clots. While taking warfarin, you will need to have regular blood tests to measure your blood clotting time. These blood tests usually include both the PT and INR tests. The PT and INR results allow your health care provider to adjust your dose of warfarin. The dose can change for many reasons. It is critically important that you take warfarin exactly as prescribed, and that you have your PT and INR levels drawn exactly as directed. °¨ Many foods, especially foods high in vitamin K, can interfere with warfarin and affect the PT and INR results. Foods high in vitamin K include spinach, kale, broccoli, cabbage, collard and turnip greens, brussels sprouts, peas, cauliflower, seaweed, and parsley, as well as beef and pork liver, green tea, and soybean oil. You should eat a consistent amount of foods high in vitamin K. Avoid major changes in your diet, or notify your health care provider before changing your diet. Arrange a visit with a dietitian to answer your questions. °¨ Many medicines can interfere with warfarin and affect the PT and INR results. You must tell your health care provider about any and all medicines you take. This  includes all vitamins and supplements. Be especially cautious with aspirin and anti-inflammatory medicines. Do not take or discontinue any prescribed or over-the-counter medicine except on the advice of your health care provider or pharmacist. °¨ Warfarin can have side effects, such as excessive bruising or bleeding. You will need to hold pressure over cuts for longer than usual. Your health care provider or pharmacist will discuss other potential side effects. °¨ Avoid sports or activities that may cause injury or bleeding. °¨ Be mindful when shaving, flossing your teeth, or handling sharp objects. °¨ Alcohol can change the body's ability to handle warfarin. It is best to avoid alcoholic drinks or consume only very small amounts while taking warfarin. Notify your health care provider if you change your alcohol intake. °¨ Notify your dentist or other health care providers before procedures. °· If swallow studies have determined that your swallowing reflex is present, you should eat healthy foods. Including 5 or more servings of fruits and vegetables a day may reduce the risk of stroke. Foods may need to be a certain consistency (soft or pureed), or small bites may need to be taken in order to avoid aspirating or choking. Certain dietary changes may be advised to address high blood pressure, high cholesterol, diabetes, or obesity. °¨ Food choices that are low in sodium, saturated fat, trans fat, and cholesterol are recommended to manage high blood pressure. °¨   Food choies that are high in fiber, and low in saturated fat, trans fat, and cholesterol may control cholesterol levels.  Controlling carbohydrates and sugar intake is recommended to manage diabetes.  Reducing calorie intake and making food choices that are low in sodium, saturated fat, trans fat, and cholesterol are recommended to manage obesity.  Maintain a healthy weight.  Stay physically active. It is recommended that you get at least 30 minutes of  activity on all or most days.  Do not use any tobacco products including cigarettes, chewing tobacco, or electronic cigarettes.  Limit alcohol use even if you are not taking warfarin. Moderate alcohol use is considered to be:  No more than 2 drinks each day for men.  No more than 1 drink each day for nonpregnant women.  Home safety. A safe home environment is important to reduce the risk of falls. Your health care provider may arrange for specialists to evaluate your home. Having grab bars in the bedroom and bathroom is often important. Your health care provider may arrange for equipment to be used at home, such as raised toilets and a seat for the shower.  Physical, occupational, and speech therapy. Ongoing therapy may be needed to maximize your recovery after a stroke. If you have been advised to use a walker or a cane, use it at all times. Be sure to keep your therapy appointments.  Follow all instructions for follow-up with your health care provider. This is very important. This includes any referrals, physical therapy, rehabilitation, and lab tests. Proper follow-up can prevent another stroke from occurring. SEEK MEDICAL CARE IF:  You have personality changes.  You have difficulty swallowing.  You are seeing double.  You have dizziness.  You have a fever.  You have skin breakdown. SEEK IMMEDIATE MEDICAL CARE IF:  Any of these symptoms may represent a serious problem that is an emergency. Do not wait to see if the symptoms will go away. Get medical help right away. Call your local emergency services (911 in [place] Do not drive yourself to the hospital.  You have sudden weakness or numbness of the face, arm, or leg, especially on one side of the body.  You have sudden trouble walking or difficulty moving arms or legs.  You have sudden confusion.  You have trouble speaking (aphasia) or understanding.  You have sudden trouble seeing in one or both eyes.  You have a loss of  balance or coordination.  You have a sudden, severe headache with no known cause.  You have new chest pain or an irregular heartbeat.  You have a partial or total loss of consciousness. Document Released: 01/26/2005 Document Revised: 06/12/2013 Document Reviewed: 09/06/2011 [facility] Patient Information 2015 [place], [place]. This information is not intended to replace advice given to you by your health care provider. Make sure you discuss any questions you have with your health care provider. Ischemic Stroke A stroke (cerebrovascular accident) is the sudden death of brain tissue. It is a medical emergency. A stroke can cause permanent loss of brain function. This can cause problems with different parts of your body. A transient ischemic attack (TIA) is different because it does not cause permanent damage. A TIA is a short-lived problem of poor blood flow affecting a part of the brain. A TIA is also a serious problem because having a TIA greatly increases the chances of having a stroke. When symptoms first develop, you cannot know if the problem might be a stroke or a TIA. CAUSES  A stroke is caused by a decrease of oxygen supply to an area of your brain. It is usually the result of a small blood clot or collection of cholesterol or fat (plaque) that blocks blood flow in the brain. A stroke can also be caused by blocked or damaged carotid arteries.  RISK FACTORS  High blood pressure (hypertension).  High cholesterol.  Diabetes mellitus.  Heart disease.  The buildup of plaque in the blood vessels (peripheral artery disease or atherosclerosis).  The buildup of plaque in the blood vessels providing blood and oxygen to the brain (carotid artery stenosis).  An abnormal heart rhythm (atrial fibrillation).  Obesity.  Smoking.  Taking oral contraceptives (especially in combination with smoking).  Physical inactivity.  A diet high in fats, salt (sodium), and calories.  Alcohol use.  Use  of illegal drugs (especially cocaine and methamphetamine).  Being African American.  Being over the age of 76.  Family history of stroke.  Previous history of blood clots, stroke, TIA, or heart attack.  Sickle cell disease. SYMPTOMS  These symptoms usually develop suddenly, or may be newly present upon awakening from sleep:  Sudden weakness or numbness of the face, arm, or leg, especially on one side of the body.  Sudden trouble walking or difficulty moving arms or legs.  Sudden confusion.  Sudden personality changes.  Trouble speaking (aphasia) or understanding.  Difficulty swallowing.  Sudden trouble seeing in one or both eyes.  Double vision.  Dizziness.  Loss of balance or coordination.  Sudden severe headache with no known cause.  Trouble reading or writing. DIAGNOSIS  Your health care provider can often determine the presence or absence of a stroke based on your symptoms, history, and physical exam. Computed tomography (CT) of the brain is usually performed to confirm the stroke, determine causes, and determine stroke severity. Other tests may be done to find the cause of the stroke. These tests may include:  Electrocardiography.  Continuous heart monitoring.  Echocardiography.  Carotid ultrasonography.  Magnetic resonance imaging (MRI).  A scan of the brain circulation.  Blood tests. PREVENTION  The risk of a stroke can be decreased by appropriately treating high blood pressure, high cholesterol, diabetes, heart disease, and obesity and by quitting smoking, limiting alcohol, and staying physically active. TREATMENT  Time is of the essence. It is important to seek treatment at the first sign of these symptoms because you may receive a medicine to dissolve the clot (thrombolytic) that cannot be given if too much time has passed since your symptoms began. Even if you do not know when your symptoms began, get treatment as soon as possible as there are other  treatment options available including oxygen, intravenous (IV) fluids, and medicines to thin the blood (anticoagulants). Treatment of stroke depends on the duration, severity, and cause of your symptoms. Medicines and dietary changes may be used to address diabetes, high blood pressure, and other risk factors. Physical, speech, and occupational therapists will assess you and work with you to improve any functions impaired by the stroke. Measures will be taken to prevent short-term and long-term complications, including infection from breathing foreign material into the lungs (aspiration pneumonia), blood clots in the legs, bedsores, and falls. Rarely, surgery may be needed to remove large blood clots or to open up blocked arteries. HOME CARE INSTRUCTIONS   Take medicines only as directed by your health care provider. Follow the directions carefully. Medicines may be used to control risk factors for a stroke. Be sure you understand  all your medicine instructions.  You may be told to take a medicine to thin the blood, such as aspirin or the anticoagulant warfarin. Warfarin needs to be taken exactly as instructed.  Too much and too little warfarin are both dangerous. Too much warfarin increases the risk of bleeding. Too little warfarin continues to allow the risk for blood clots. While taking warfarin, you will need to have regular blood tests to measure your blood clotting time. These blood tests usually include both the PT and INR tests. The PT and INR results allow your health care provider to adjust your dose of warfarin. The dose can change for many reasons. It is critically important that you take warfarin exactly as prescribed, and that you have your PT and INR levels drawn exactly as directed.  Many foods, especially foods high in vitamin K, can interfere with warfarin and affect the PT and INR results. Foods high in vitamin K include spinach, kale, broccoli, cabbage, collard and turnip greens,  brussels sprouts, peas, cauliflower, seaweed, and parsley, as well as beef and pork liver, green tea, and soybean oil. You should eat a consistent amount of foods high in vitamin K. Avoid major changes in your diet, or notify your health care provider before changing your diet. Arrange a visit with a dietitian to answer your questions.  Many medicines can interfere with warfarin and affect the PT and INR results. You must tell your health care provider about any and all medicines you take. This includes all vitamins and supplements. Be especially cautious with aspirin and anti-inflammatory medicines. Do not take or discontinue any prescribed or over-the-counter medicine except on the advice of your health care provider or pharmacist.  Warfarin can have side effects, such as excessive bruising or bleeding. You will need to hold pressure over cuts for longer than usual. Your health care provider or pharmacist will discuss other potential side effects.  Avoid sports or activities that may cause injury or bleeding.  Be mindful when shaving, flossing your teeth, or handling sharp objects.  Alcohol can change the body's ability to handle warfarin. It is best to avoid alcoholic drinks or consume only very small amounts while taking warfarin. Notify your health care provider if you change your alcohol intake.  Notify your dentist or other health care providers before procedures.  If swallow studies have determined that your swallowing reflex is present, you should eat healthy foods. Including 5 or more servings of fruits and vegetables a day may reduce the risk of stroke. Foods may need to be a certain consistency (soft or pureed), or small bites may need to be taken in order to avoid aspirating or choking. Certain dietary changes may be advised to address high blood pressure, high cholesterol, diabetes, or obesity.  Food choices that are low in sodium, saturated fat, trans fat, and cholesterol are  recommended to manage high blood pressure.  Food choies that are high in fiber, and low in saturated fat, trans fat, and cholesterol may control cholesterol levels.  Controlling carbohydrates and sugar intake is recommended to manage diabetes.  Reducing calorie intake and making food choices that are low in sodium, saturated fat, trans fat, and cholesterol are recommended to manage obesity.  Maintain a healthy weight.  Stay physically active. It is recommended that you get at least 30 minutes of activity on all or most days.  Do not use any tobacco products including cigarettes, chewing tobacco, or electronic cigarettes.  Limit alcohol use even if you are  not taking warfarin. Moderate alcohol use is considered to be:  No more than 2 drinks each day for men.  No more than 1 drink each day for nonpregnant women.  Home safety. A safe home environment is important to reduce the risk of falls. Your health care provider may arrange for specialists to evaluate your home. Having grab bars in the bedroom and bathroom is often important. Your health care provider may arrange for equipment to be used at home, such as raised toilets and a seat for the shower.  Physical, occupational, and speech therapy. Ongoing therapy may be needed to maximize your recovery after a stroke. If you have been advised to use a walker or a cane, use it at all times. Be sure to keep your therapy appointments.  Follow all instructions for follow-up with your health care provider. This is very important. This includes any referrals, physical therapy, rehabilitation, and lab tests. Proper follow-up can prevent another stroke from occurring. SEEK MEDICAL CARE IF:  You have personality changes.  You have difficulty swallowing.  You are seeing double.  You have dizziness.  You have a fever.  You have skin breakdown. SEEK IMMEDIATE MEDICAL CARE IF:  Any of these symptoms may represent a serious problem that is an  emergency. Do not wait to see if the symptoms will go away. Get medical help right away. Call your local emergency services (911 in [place] Do not drive yourself to the hospital.  You have sudden weakness or numbness of the face, arm, or leg, especially on one side of the body.  You have sudden trouble walking or difficulty moving arms or legs.  You have sudden confusion.  You have trouble speaking (aphasia) or understanding.  You have sudden trouble seeing in one or both eyes.  You have a loss of balance or coordination.  You have a sudden, severe headache with no known cause.  You have new chest pain or an irregular heartbeat.  You have a partial or total loss of consciousness. Document Released: 01/26/2005 Document Revised: 06/12/2013 Document Reviewed: 09/06/2011 [facility] Patient Information 2015 [place], [place]. This information is not intended to replace advice given to you by your health care provider. Make sure you discuss any questions you have with your health care provider.

## 2014-01-16 ENCOUNTER — Other Ambulatory Visit: Payer: Self-pay

## 2014-01-17 ENCOUNTER — Encounter (HOSPITAL_COMMUNITY)
Admission: RE | Admit: 2014-01-17 | Discharge: 2014-01-17 | Disposition: A | Discharge status: Home / Self Care | Payer: Medicare PPO | Source: Ambulatory Visit | Attending: Surgery | Admitting: Surgery

## 2014-01-17 ENCOUNTER — Encounter (HOSPITAL_COMMUNITY): Payer: Self-pay

## 2014-01-17 HISTORY — DX: Unspecified osteoarthritis, unspecified site: M19.90

## 2014-01-17 HISTORY — DX: Personal history of other diseases of the nervous system and sense organs: Z86.69

## 2014-01-17 HISTORY — DX: Other specified postprocedural states: Z98.890

## 2014-01-17 LAB — URINALYSIS, ROUTINE W REFLEX MICROSCOPIC
Glucose, UA: NEGATIVE mg/dL
Hgb urine dipstick: NEGATIVE
Ketones, ur: 15 mg/dL — AB
NITRITE: NEGATIVE
PH: 5 (ref 5.0–8.0)
Protein, ur: NEGATIVE mg/dL
SPECIFIC GRAVITY, URINE: 1.029 (ref 1.005–1.030)
Urobilinogen, UA: 1 mg/dL (ref 0.0–1.0)

## 2014-01-17 LAB — TYPE AND SCREEN
ABO/RH(D): O POS
ANTIBODY SCREEN: NEGATIVE

## 2014-01-17 LAB — ABO/RH: ABO/RH(D): O POS

## 2014-01-17 LAB — CBC
HCT: 43.3 % (ref 36.0–46.0)
Hemoglobin: 14.2 g/dL (ref 12.0–15.0)
MCH: 27.7 pg (ref 26.0–34.0)
MCHC: 32.8 g/dL (ref 30.0–36.0)
MCV: 84.4 fL (ref 78.0–100.0)
Platelets: 207 10*3/uL (ref 150–400)
RBC: 5.13 MIL/uL — AB (ref 3.87–5.11)
RDW: 13.7 % (ref 11.5–15.5)
WBC: 4.8 10*3/uL (ref 4.0–10.5)

## 2014-01-17 LAB — COMPREHENSIVE METABOLIC PANEL
ALT: 16 U/L (ref 0–35)
ANION GAP: 14 (ref 5–15)
AST: 24 U/L (ref 0–37)
Albumin: 3.8 g/dL (ref 3.5–5.2)
Alkaline Phosphatase: 82 U/L (ref 39–117)
BUN: 28 mg/dL — AB (ref 6–23)
CO2: 25 meq/L (ref 19–32)
CREATININE: 1.05 mg/dL (ref 0.50–1.10)
Calcium: 9.8 mg/dL (ref 8.4–10.5)
Chloride: 102 mEq/L (ref 96–112)
GFR calc Af Amer: 58 mL/min — ABNORMAL LOW (ref >90)
GFR, EST NON AFRICAN AMERICAN: 50 mL/min — AB (ref >90)
Glucose, Bld: 99 mg/dL (ref 70–99)
POTASSIUM: 4 meq/L (ref 3.7–5.3)
Sodium: 141 mEq/L (ref 137–147)
Total Bilirubin: 0.4 mg/dL (ref 0.3–1.2)
Total Protein: 7.4 g/dL (ref 6.0–8.3)

## 2014-01-17 LAB — URINE MICROSCOPIC-ADD ON

## 2014-01-17 LAB — SURGICAL PCR SCREEN
MRSA, PCR: NEGATIVE
Staphylococcus aureus: NEGATIVE

## 2014-01-17 LAB — PROTIME-INR
INR: 0.95 (ref 0.00–1.49)
PROTHROMBIN TIME: 12.8 s (ref 11.6–15.2)

## 2014-01-17 LAB — APTT: APTT: 31 s (ref 24–37)

## 2014-01-17 NOTE — Progress Notes (Signed)
Notified Okey Regalarol, RN at [facility] of abnormal UA.

## 2014-01-17 NOTE — Pre-Procedure Instructions (Signed)
Kathleen BrothersMargie Figueroa  01/17/2014   Your procedure is scheduled on:  Friday, January 19, 2014 at 1:15 PM.   [facility] at 11:15 AM.   Call this number if you have problems the morning of surgery: [phone]                Any questions prior to day of surgery, please call [phone] between 8 & 4PM.   Remember:   Do not eat food or drink liquids after midnight Thursday, 01/18/14.   Take these medicines the morning of surgery with A SIP OF WATER: amLODipine (NORVASC),  Aspirin, clopidogrel (PLAVIX)   Do not wear jewelry, make-up or nail polish.  Do not wear lotions, powders, or perfumes. You may wear deodorant.  Do not shave 48 hours prior to surgery.   Do not bring valuables to the hospital.  [facility] is not responsible                  for any belongings or valuables.               Contacts, dentures or bridgework may not be worn into surgery.  Leave suitcase in the car. After surgery it may be brought to your room.  For patients admitted to the hospital, discharge time is determined by your                treatment team.               Special Instructions: [facility] - Preparing for Surgery  Before surgery, you can play an important role.  Because skin is not sterile, your skin needs to be as free of germs as possible.  You can reduce the number of germs on you skin by washing with CHG (chlorahexidine gluconate) soap before surgery.  CHG is an antiseptic cleaner which kills germs and bonds with the skin to continue killing germs even after washing.  Please DO NOT use if you have an allergy to CHG or antibacterial soaps.  If your skin becomes reddened/irritated stop using the CHG and inform your nurse when you arrive at Short Stay.  Do not shave (including legs and underarms) for at least 48 hours prior to the first CHG shower.  You may shave your face.  Please follow these instructions carefully:   1.  Shower with CHG Soap the  night before surgery and the                                morning of Surgery.  2.  If you choose to wash your hair, wash your hair first as usual with your       normal shampoo.  3.  After you shampoo, rinse your hair and body thoroughly to remove the                      Shampoo.  4.  Use CHG as you would any other liquid soap.  You can apply chg directly       to the skin and wash gently with scrungie or a clean washcloth.  5.  Apply the CHG Soap to your body ONLY FROM THE NECK DOWN.        Do not use on open wounds or open sores.  Avoid contact with your eyes, ears, mouth and genitals (private parts).  Wash genitals (private parts)  with your normal soap.  6.  Wash thoroughly, paying special attention to the area where your surgery        will be performed.  7.  Thoroughly rinse your body with warm water from the neck down.  8.  DO NOT shower/wash with your normal soap after using and rinsing off       the CHG Soap.  9.  Pat yourself dry with a clean towel.            10.  Wear clean pajamas.            11.  Place clean sheets on your bed the night of your first shower and do not        sleep with pets.  Day of Surgery  Do not apply any lotions the morning of surgery.  Please wear clean clothes to the hospital.     Please read over the following fact sheets that you were given: Pain Booklet, Coughing and Deep Breathing, Blood Transfusion Information, MRSA Information and Surgical Site Infection Prevention

## 2014-01-17 NOTE — Progress Notes (Signed)
Pt and daughter thought pt was to have surgery on her right carotid artery. Orders for consent stated left. I called and left message at [facility] to have the order verified.

## 2014-01-18 ENCOUNTER — Encounter (HOSPITAL_COMMUNITY)
Admission: RE | Admit: 2014-01-18 | Discharge: 2014-01-18 | Disposition: A | Discharge status: Home / Self Care | Payer: Medicare PPO | Source: Ambulatory Visit | Attending: Surgery | Admitting: Surgery

## 2014-01-18 ENCOUNTER — Telehealth: Payer: Self-pay

## 2014-01-18 ENCOUNTER — Other Ambulatory Visit: Payer: Self-pay

## 2014-01-18 LAB — URINALYSIS, ROUTINE W REFLEX MICROSCOPIC
Glucose, UA: NEGATIVE mg/dL
Hgb urine dipstick: NEGATIVE
Ketones, ur: 15 mg/dL — AB
Leukocytes, UA: NEGATIVE
Nitrite: NEGATIVE
Protein, ur: NEGATIVE mg/dL
Specific Gravity, Urine: 1.021 (ref 1.005–1.030)
Urobilinogen, UA: 0.2 mg/dL (ref 0.0–1.0)
pH: 5 (ref 5.0–8.0)

## 2014-01-18 MED ORDER — DEXTROSE 5 % IV SOLN
1.5000 g | INTRAVENOUS | Status: AC
  Administered 2014-01-19: 1.5 g via INTRAVENOUS
  Filled 2014-01-18: qty 1.5

## 2014-01-18 NOTE — Telephone Encounter (Signed)
On 01/17/14, rec'd call from [place] Heightseresa, RN @ [facility].; reported that pt. had a "dirty urine" from her pre-op specimen.  Urine micro showed "many Epithelials, and many bacteria, with negative Nitrites."  Per [facility] protocol, will repeat UA with C & S on 01/18/14.  Notified pt. this AM of need to repeat UA today; instructed to go to Admitting [facility] and will be directed to Pre-op for repeat UA.  Verb. Understanding.

## 2014-01-19 ENCOUNTER — Inpatient Hospital Stay (HOSPITAL_COMMUNITY): Payer: Medicare PPO | Admitting: Vascular Surgery

## 2014-01-19 ENCOUNTER — Inpatient Hospital Stay (HOSPITAL_COMMUNITY): Payer: Medicare PPO | Admitting: Certified Registered"

## 2014-01-19 ENCOUNTER — Encounter (HOSPITAL_COMMUNITY): Payer: Self-pay | Admitting: Certified Registered"

## 2014-01-19 ENCOUNTER — Encounter (HOSPITAL_COMMUNITY)
Admission: RE | Disposition: A | Discharge status: Home Health | Payer: Self-pay | Source: Ambulatory Visit | Attending: Surgery

## 2014-01-19 ENCOUNTER — Inpatient Hospital Stay (HOSPITAL_COMMUNITY)
Admission: RE | Admit: 2014-01-19 | Discharge: 2014-01-20 | DRG: 039 | Disposition: A | Discharge status: Home Health | Payer: Medicare PPO | Source: Ambulatory Visit | Attending: Surgery | Admitting: Surgery

## 2014-01-19 DIAGNOSIS — I6529 Occlusion and stenosis of unspecified carotid artery: Secondary | ICD-10-CM | POA: Diagnosis present

## 2014-01-19 DIAGNOSIS — Z8249 Family history of ischemic heart disease and other diseases of the circulatory system: Secondary | ICD-10-CM

## 2014-01-19 DIAGNOSIS — E119 Type 2 diabetes mellitus without complications: Secondary | ICD-10-CM | POA: Diagnosis present

## 2014-01-19 DIAGNOSIS — I63232 Cerebral infarction due to unspecified occlusion or stenosis of left carotid arteries: Secondary | ICD-10-CM | POA: Diagnosis present

## 2014-01-19 DIAGNOSIS — I6522 Occlusion and stenosis of left carotid artery: Secondary | ICD-10-CM | POA: Diagnosis not present

## 2014-01-19 DIAGNOSIS — Z87891 Personal history of nicotine dependence: Secondary | ICD-10-CM | POA: Diagnosis not present

## 2014-01-19 DIAGNOSIS — Z8673 Personal history of transient ischemic attack (TIA), and cerebral infarction without residual deficits: Secondary | ICD-10-CM | POA: Diagnosis not present

## 2014-01-19 DIAGNOSIS — Z79899 Other long term (current) drug therapy: Secondary | ICD-10-CM | POA: Diagnosis not present

## 2014-01-19 DIAGNOSIS — I10 Essential (primary) hypertension: Secondary | ICD-10-CM | POA: Diagnosis present

## 2014-01-19 DIAGNOSIS — Z888 Allergy status to other drugs, medicaments and biological substances status: Secondary | ICD-10-CM | POA: Diagnosis not present

## 2014-01-19 HISTORY — PX: PATCH ANGIOPLASTY: SHX6230

## 2014-01-19 HISTORY — PX: ENDARTERECTOMY: SHX5162

## 2014-01-19 LAB — URINE CULTURE: Colony Count: 75000

## 2014-01-19 LAB — CREATININE, SERUM
Creatinine, Ser: 0.82 mg/dL (ref 0.50–1.10)
GFR calc Af Amer: 79 mL/min — ABNORMAL LOW (ref >90)
GFR, EST NON AFRICAN AMERICAN: 68 mL/min — AB (ref >90)

## 2014-01-19 LAB — CBC
HEMATOCRIT: 40.1 % (ref 36.0–46.0)
Hemoglobin: 12.9 g/dL (ref 12.0–15.0)
MCH: 26.7 pg (ref 26.0–34.0)
MCHC: 32.2 g/dL (ref 30.0–36.0)
MCV: 82.9 fL (ref 78.0–100.0)
Platelets: 202 10*3/uL (ref 150–400)
RBC: 4.84 MIL/uL (ref 3.87–5.11)
RDW: 13.9 % (ref 11.5–15.5)
WBC: 7.9 10*3/uL (ref 4.0–10.5)

## 2014-01-19 LAB — GLUCOSE, CAPILLARY
GLUCOSE-CAPILLARY: 107 mg/dL — AB (ref 70–99)
GLUCOSE-CAPILLARY: 123 mg/dL — AB (ref 70–99)
Glucose-Capillary: 83 mg/dL (ref 70–99)

## 2014-01-19 SURGERY — ENDARTERECTOMY, CAROTID
Anesthesia: General | Site: Neck | Laterality: Left

## 2014-01-19 MED ORDER — OXYCODONE HCL 5 MG PO TABS
5.0000 mg | ORAL_TABLET | Freq: Once | ORAL | Status: AC | PRN
  Administered 2014-01-19: 5 mg via ORAL

## 2014-01-19 MED ORDER — GUAIFENESIN-DM 100-10 MG/5ML PO SYRP: 15.0000 mL | ORAL_SOLUTION | ORAL | Status: DC | PRN

## 2014-01-19 MED ORDER — ASPIRIN EC 81 MG PO TBEC
81.0000 mg | DELAYED_RELEASE_TABLET | Freq: Every day | ORAL | Status: DC
  Administered 2014-01-20: 81 mg via ORAL
  Filled 2014-01-19: qty 1

## 2014-01-19 MED ORDER — CHLORHEXIDINE GLUCONATE 4 % EX LIQD
60.0000 mL | Freq: Once | CUTANEOUS | Status: DC
  Filled 2014-01-19: qty 60

## 2014-01-19 MED ORDER — LIDOCAINE HCL (CARDIAC) 20 MG/ML IV SOLN
INTRAVENOUS | Status: AC
  Filled 2014-01-19: qty 10

## 2014-01-19 MED ORDER — SODIUM CHLORIDE 0.9 % IV SOLN
0.0125 ug/kg/min | INTRAVENOUS | Status: DC
  Filled 2014-01-19: qty 2000

## 2014-01-19 MED ORDER — INSULIN ASPART 100 UNIT/ML ~~LOC~~ SOLN: 0.0000 [IU] | Freq: Three times a day (TID) | SUBCUTANEOUS | Status: DC

## 2014-01-19 MED ORDER — PHENYLEPHRINE HCL 10 MG/ML IJ SOLN
10.0000 mg | INTRAVENOUS | Status: DC | PRN
  Administered 2014-01-19: 10 ug/min via INTRAVENOUS

## 2014-01-19 MED ORDER — NEOSTIGMINE METHYLSULFATE 10 MG/10ML IV SOLN
INTRAVENOUS | Status: DC | PRN
Start: 2014-01-19 — End: 2014-01-19
  Administered 2014-01-19: 4 mg via INTRAVENOUS

## 2014-01-19 MED ORDER — SODIUM BICARBONATE 8.4 % IV SOLN
INTRAVENOUS | Status: AC
  Filled 2014-01-19: qty 50

## 2014-01-19 MED ORDER — CLOPIDOGREL BISULFATE 75 MG PO TABS
75.0000 mg | ORAL_TABLET | Freq: Every day | ORAL | Status: DC
  Administered 2014-01-20: 75 mg via ORAL
  Filled 2014-01-19: qty 1

## 2014-01-19 MED ORDER — DEXTROSE 5 % IV SOLN
1.5000 g | Freq: Two times a day (BID) | INTRAVENOUS | Status: DC
  Administered 2014-01-20: 1.5 g via INTRAVENOUS
  Filled 2014-01-19 (×2): qty 1.5

## 2014-01-19 MED ORDER — POTASSIUM CHLORIDE CRYS ER 20 MEQ PO TBCR: 20.0000 meq | EXTENDED_RELEASE_TABLET | Freq: Every day | ORAL | Status: DC | PRN

## 2014-01-19 MED ORDER — ACETAMINOPHEN 160 MG/5ML PO SOLN
325.0000 mg | ORAL | Status: DC | PRN
  Filled 2014-01-19: qty 20.3

## 2014-01-19 MED ORDER — LACTATED RINGERS IV SOLN
INTRAVENOUS | Status: DC
  Administered 2014-01-19: 11:00:00 via INTRAVENOUS

## 2014-01-19 MED ORDER — LACTATED RINGERS IV SOLN
INTRAVENOUS | Status: DC
  Administered 2014-01-19 (×3): via INTRAVENOUS

## 2014-01-19 MED ORDER — ROCURONIUM BROMIDE 50 MG/5ML IV SOLN
INTRAVENOUS | Status: AC
  Filled 2014-01-19: qty 1

## 2014-01-19 MED ORDER — ONDANSETRON HCL 4 MG/2ML IJ SOLN: 4.0000 mg | Freq: Four times a day (QID) | INTRAMUSCULAR | Status: DC | PRN

## 2014-01-19 MED ORDER — LIDOCAINE HCL (CARDIAC) 20 MG/ML IV SOLN
INTRAVENOUS | Status: DC | PRN
  Administered 2014-01-19: 50 mg via INTRAVENOUS

## 2014-01-19 MED ORDER — GLYCOPYRROLATE 0.2 MG/ML IJ SOLN
INTRAMUSCULAR | Status: AC
  Filled 2014-01-19: qty 1

## 2014-01-19 MED ORDER — BISACODYL 10 MG RE SUPP: 10.0000 mg | Freq: Every day | RECTAL | Status: DC | PRN

## 2014-01-19 MED ORDER — METFORMIN HCL 500 MG PO TABS: 1000.0000 mg | ORAL_TABLET | Freq: Every day | ORAL | Status: DC

## 2014-01-19 MED ORDER — PROPOFOL 10 MG/ML IV BOLUS
INTRAVENOUS | Status: DC | PRN
  Administered 2014-01-19: 50 mg via INTRAVENOUS
  Administered 2014-01-19: 60 mg via INTRAVENOUS

## 2014-01-19 MED ORDER — OXYCODONE HCL 5 MG/5ML PO SOLN: 5.0000 mg | Freq: Once | ORAL | Status: AC | PRN

## 2014-01-19 MED ORDER — HYDRALAZINE HCL 20 MG/ML IJ SOLN: 5.0000 mg | INTRAMUSCULAR | Status: DC | PRN

## 2014-01-19 MED ORDER — CHLORHEXIDINE GLUCONATE 4 % EX LIQD
60.0000 mL | Freq: Once | CUTANEOUS | Status: DC
Start: 2014-01-19 — End: 2014-01-19
  Filled 2014-01-19: qty 60

## 2014-01-19 MED ORDER — FENTANYL CITRATE 0.05 MG/ML IJ SOLN
25.0000 ug | INTRAMUSCULAR | Status: DC | PRN
  Administered 2014-01-19 (×4): 25 ug via INTRAVENOUS

## 2014-01-19 MED ORDER — ALUM & MAG HYDROXIDE-SIMETH 200-200-20 MG/5ML PO SUSP: 15.0000 mL | ORAL | Status: DC | PRN

## 2014-01-19 MED ORDER — MAGNESIUM SULFATE 2 GM/50ML IV SOLN: 2.0000 g | Freq: Every day | INTRAVENOUS | Status: DC | PRN

## 2014-01-19 MED ORDER — ACETAMINOPHEN 325 MG PO TABS: 325.0000 mg | ORAL_TABLET | ORAL | Status: DC | PRN

## 2014-01-19 MED ORDER — METFORMIN HCL 500 MG PO TABS
1000.0000 mg | ORAL_TABLET | Freq: Every day | ORAL | Status: DC
  Administered 2014-01-20: 1000 mg via ORAL
  Filled 2014-01-19 (×2): qty 2

## 2014-01-19 MED ORDER — FENTANYL CITRATE 0.05 MG/ML IJ SOLN
INTRAMUSCULAR | Status: AC
  Filled 2014-01-19: qty 5

## 2014-01-19 MED ORDER — 0.9 % SODIUM CHLORIDE (POUR BTL) OPTIME
TOPICAL | Status: DC | PRN
  Administered 2014-01-19: 2000 mL

## 2014-01-19 MED ORDER — DOCUSATE SODIUM 100 MG PO CAPS: 100.0000 mg | ORAL_CAPSULE | Freq: Every day | ORAL | Status: DC

## 2014-01-19 MED ORDER — AMLODIPINE BESYLATE 10 MG PO TABS
10.0000 mg | ORAL_TABLET | Freq: Every day | ORAL | Status: DC
  Administered 2014-01-20: 10 mg via ORAL
  Filled 2014-01-19: qty 1

## 2014-01-19 MED ORDER — SODIUM CHLORIDE 0.9 % IV SOLN
2000.0000 ug | INTRAVENOUS | Status: DC | PRN
  Administered 2014-01-19: .3 ug/kg/min via INTRAVENOUS

## 2014-01-19 MED ORDER — OXYCODONE-ACETAMINOPHEN 5-325 MG PO TABS
1.0000 | ORAL_TABLET | ORAL | Status: DC | PRN
  Administered 2014-01-19: 2 via ORAL
  Filled 2014-01-19: qty 2

## 2014-01-19 MED ORDER — IRBESARTAN 300 MG PO TABS
300.0000 mg | ORAL_TABLET | Freq: Every day | ORAL | Status: DC
  Administered 2014-01-20: 300 mg via ORAL
  Filled 2014-01-19 (×2): qty 1

## 2014-01-19 MED ORDER — FENTANYL CITRATE 0.05 MG/ML IJ SOLN
INTRAMUSCULAR | Status: AC
  Filled 2014-01-19: qty 2

## 2014-01-19 MED ORDER — ENOXAPARIN SODIUM 30 MG/0.3ML ~~LOC~~ SOLN
30.0000 mg | SUBCUTANEOUS | Status: DC
  Administered 2014-01-20: 30 mg via SUBCUTANEOUS
  Filled 2014-01-19 (×2): qty 0.3

## 2014-01-19 MED ORDER — GLYCOPYRROLATE 0.2 MG/ML IJ SOLN
INTRAMUSCULAR | Status: DC | PRN
  Administered 2014-01-19: 0.6 mg via INTRAVENOUS

## 2014-01-19 MED ORDER — PROPOFOL 10 MG/ML IV BOLUS
INTRAVENOUS | Status: AC
  Filled 2014-01-19: qty 20

## 2014-01-19 MED ORDER — SODIUM CHLORIDE 0.9 % IV SOLN
INTRAVENOUS | Status: DC
  Administered 2014-01-19: 125 mL/h via INTRAVENOUS

## 2014-01-19 MED ORDER — EPHEDRINE SULFATE 50 MG/ML IJ SOLN
INTRAMUSCULAR | Status: DC | PRN
  Administered 2014-01-19: 15 mg via INTRAVENOUS
  Administered 2014-01-19: 5 mg via INTRAVENOUS

## 2014-01-19 MED ORDER — SODIUM CHLORIDE 0.9 % IR SOLN
Status: DC | PRN
  Administered 2014-01-19: 13:00:00

## 2014-01-19 MED ORDER — ROCURONIUM BROMIDE 100 MG/10ML IV SOLN
INTRAVENOUS | Status: DC | PRN
  Administered 2014-01-19: 5 mg via INTRAVENOUS
  Administered 2014-01-19: 40 mg via INTRAVENOUS

## 2014-01-19 MED ORDER — ONDANSETRON HCL 4 MG/2ML IJ SOLN
INTRAMUSCULAR | Status: DC | PRN
  Administered 2014-01-19: 4 mg via INTRAVENOUS

## 2014-01-19 MED ORDER — HEPARIN SODIUM (PORCINE) 1000 UNIT/ML IJ SOLN
INTRAMUSCULAR | Status: AC
Start: 2014-01-19 — End: 2014-01-19
  Filled 2014-01-19: qty 1

## 2014-01-19 MED ORDER — POLYETHYLENE GLYCOL 3350 17 G PO PACK
17.0000 g | PACK | Freq: Every day | ORAL | Status: DC | PRN
  Filled 2014-01-19: qty 1

## 2014-01-19 MED ORDER — PHENOL 1.4 % MT LIQD: 1.0000 | OROMUCOSAL | Status: DC | PRN

## 2014-01-19 MED ORDER — LIDOCAINE HCL (PF) 1 % IJ SOLN
INTRAMUSCULAR | Status: AC
  Filled 2014-01-19: qty 30

## 2014-01-19 MED ORDER — SODIUM CHLORIDE 0.9 % IV SOLN: 500.0000 mL | Freq: Once | INTRAVENOUS | Status: AC | PRN

## 2014-01-19 MED ORDER — HEPARIN SODIUM (PORCINE) 1000 UNIT/ML IJ SOLN
INTRAMUSCULAR | Status: DC | PRN
  Administered 2014-01-19: 8000 [IU] via INTRAVENOUS

## 2014-01-19 MED ORDER — MORPHINE SULFATE 2 MG/ML IJ SOLN: 2.0000 mg | INTRAMUSCULAR | Status: DC | PRN

## 2014-01-19 MED ORDER — OXYCODONE-ACETAMINOPHEN 5-325 MG PO TABS: 1.0000 | ORAL_TABLET | Freq: Four times a day (QID) | ORAL | Status: DC | PRN

## 2014-01-19 MED ORDER — OXYCODONE HCL 5 MG PO TABS
ORAL_TABLET | ORAL | Status: AC
  Filled 2014-01-19: qty 1

## 2014-01-19 MED ORDER — PROTAMINE SULFATE 10 MG/ML IV SOLN
INTRAVENOUS | Status: AC
  Filled 2014-01-19: qty 5

## 2014-01-19 MED ORDER — ACETAMINOPHEN 650 MG RE SUPP: 325.0000 mg | RECTAL | Status: DC | PRN

## 2014-01-19 MED ORDER — PANTOPRAZOLE SODIUM 40 MG PO TBEC: 40.0000 mg | DELAYED_RELEASE_TABLET | Freq: Every day | ORAL | Status: DC

## 2014-01-19 MED ORDER — HEMOSTATIC AGENTS (NO CHARGE) OPTIME
TOPICAL | Status: DC | PRN
  Administered 2014-01-19: 1

## 2014-01-19 MED ORDER — SODIUM CHLORIDE 0.9 % IV SOLN: INTRAVENOUS | Status: DC

## 2014-01-19 MED ORDER — METOPROLOL TARTRATE 1 MG/ML IV SOLN: 2.0000 mg | INTRAVENOUS | Status: DC | PRN

## 2014-01-19 MED ORDER — LABETALOL HCL 5 MG/ML IV SOLN: 10.0000 mg | INTRAVENOUS | Status: DC | PRN

## 2014-01-19 MED ORDER — PROTAMINE SULFATE 10 MG/ML IV SOLN
INTRAVENOUS | Status: DC | PRN
  Administered 2014-01-19: 50 mg via INTRAVENOUS

## 2014-01-19 MED ORDER — ATORVASTATIN CALCIUM 40 MG PO TABS
40.0000 mg | ORAL_TABLET | Freq: Every day | ORAL | Status: DC
  Administered 2014-01-20: 40 mg via ORAL
  Filled 2014-01-19 (×2): qty 1

## 2014-01-19 SURGICAL SUPPLY — 54 items
BLADE SURG 10 STRL SS (BLADE) ×3 IMPLANT
CANISTER SUCTION 2500CC (MISCELLANEOUS) ×3 IMPLANT
CATH ROBINSON RED A/P 18FR (CATHETERS) ×3 IMPLANT
CATH SUCT 10FR WHISTLE TIP (CATHETERS) ×3 IMPLANT
CLIP TI MEDIUM 6 (CLIP) ×3 IMPLANT
CLIP TI WIDE RED SMALL 6 (CLIP) ×3 IMPLANT
CRADLE DONUT ADULT HEAD (MISCELLANEOUS) ×3 IMPLANT
DRAIN CHANNEL 15F RND FF W/TCR (WOUND CARE) ×3 IMPLANT
ELECT REM PT RETURN 9FT ADLT (ELECTROSURGICAL) ×3
ELECTRODE REM PT RTRN 9FT ADLT (ELECTROSURGICAL) ×1 IMPLANT
EVACUATOR SILICONE 100CC (DRAIN) ×3 IMPLANT
GAUZE SPONGE 2X2 8PLY STRL LF (GAUZE/BANDAGES/DRESSINGS) ×1 IMPLANT
GAUZE SPONGE 4X4 12PLY STRL (GAUZE/BANDAGES/DRESSINGS) ×3 IMPLANT
GLOVE BIO SURGEON STRL SZ 6.5 (GLOVE) ×2 IMPLANT
GLOVE BIO SURGEON STRL SZ7.5 (GLOVE) ×6 IMPLANT
GLOVE BIO SURGEONS STRL SZ 6.5 (GLOVE) ×1
GLOVE BIOGEL PI IND STRL 6.5 (GLOVE) ×1 IMPLANT
GLOVE BIOGEL PI IND STRL 7.0 (GLOVE) ×1 IMPLANT
GLOVE BIOGEL PI IND STRL 7.5 (GLOVE) ×2 IMPLANT
GLOVE BIOGEL PI IND STRL 8 (GLOVE) ×2 IMPLANT
GLOVE BIOGEL PI INDICATOR 6.5 (GLOVE) ×2
GLOVE BIOGEL PI INDICATOR 7.0 (GLOVE) ×2
GLOVE BIOGEL PI INDICATOR 7.5 (GLOVE) ×4
GLOVE BIOGEL PI INDICATOR 8 (GLOVE) ×4
GLOVE ECLIPSE 7.0 STRL STRAW (GLOVE) ×3 IMPLANT
GLOVE SURG SS PI 7.5 STRL IVOR (GLOVE) ×3 IMPLANT
GOWN STRL REUS W/ TWL LRG LVL3 (GOWN DISPOSABLE) ×2 IMPLANT
GOWN STRL REUS W/ TWL XL LVL3 (GOWN DISPOSABLE) ×3 IMPLANT
GOWN STRL REUS W/TWL LRG LVL3 (GOWN DISPOSABLE) ×4
GOWN STRL REUS W/TWL XL LVL3 (GOWN DISPOSABLE) ×6
HEMOSTAT SNOW SURGICEL 2X4 (HEMOSTASIS) ×3 IMPLANT
INSERT FOGARTY SM (MISCELLANEOUS) IMPLANT
KIT BASIN OR (CUSTOM PROCEDURE TRAY) ×3 IMPLANT
KIT ROOM TURNOVER OR (KITS) ×3 IMPLANT
LIQUID BAND (GAUZE/BANDAGES/DRESSINGS) ×3 IMPLANT
NS IRRIG 1000ML POUR BTL (IV SOLUTION) ×9 IMPLANT
PACK CAROTID (CUSTOM PROCEDURE TRAY) ×3 IMPLANT
PAD ARMBOARD 7.5X6 YLW CONV (MISCELLANEOUS) ×6 IMPLANT
PATCH VASCULAR VASCU GUARD 1X6 (Vascular Products) ×3 IMPLANT
PROBE PENCIL 8 MHZ STRL DISP (MISCELLANEOUS) ×3 IMPLANT
SHUNT CAROTID BYPASS 10 (VASCULAR PRODUCTS) IMPLANT
SHUNT CAROTID BYPASS 12FRX15.5 (VASCULAR PRODUCTS) IMPLANT
SPONGE GAUZE 2X2 STER 10/PKG (GAUZE/BANDAGES/DRESSINGS) ×2
SPONGE INTESTINAL PEANUT (DISPOSABLE) ×6 IMPLANT
SUT ETHILON 3 0 PS 1 (SUTURE) ×3 IMPLANT
SUT PROLENE 6 0 BV (SUTURE) ×15 IMPLANT
SUT PROLENE 7 0 BV 1 (SUTURE) ×3 IMPLANT
SUT SILK 3 0 TIES 17X18 (SUTURE)
SUT SILK 3-0 18XBRD TIE BLK (SUTURE) IMPLANT
SUT VIC AB 3-0 SH 27 (SUTURE) ×4
SUT VIC AB 3-0 SH 27X BRD (SUTURE) ×2 IMPLANT
SUT VICRYL 4-0 PS2 18IN ABS (SUTURE) ×3 IMPLANT
TAPE CLOTH SOFT 2X10 (GAUZE/BANDAGES/DRESSINGS) ×3 IMPLANT
WATER STERILE IRR 1000ML POUR (IV SOLUTION) ×3 IMPLANT

## 2014-01-19 NOTE — Progress Notes (Signed)
ANTIBIOTIC CONSULT NOTE - INITIAL  Pharmacy Consult for Antibiotic renal dose adjustment Indication: Surgical prophylaxis  Allergies  Allergen Reactions  . Aspirin Nausea Only    Patient Measurements: Weight: 178 lb (80.74 kg)  Vital Signs: Temp: 97.9 F (36.6 C) (12/11 2015) Temp Source: Oral (12/11 1106) BP: 124/55 mmHg (12/11 1848) Pulse Rate: 79 (12/11 1848) Intake/Output from previous day:   Intake/Output from this shift:    Labs:  Recent Labs  01/17/14 1249  WBC 4.8  HGB 14.2  PLT 207  CREATININE 1.05   Estimated Creatinine Clearance: 48.9 mL/min (by C-G formula based on Cr of 1.05). No results for input(s): VANCOTROUGH, VANCOPEAK, VANCORANDOM, GENTTROUGH, GENTPEAK, GENTRANDOM, TOBRATROUGH, TOBRAPEAK, TOBRARND, AMIKACINPEAK, AMIKACINTROU, AMIKACIN in the last 72 hours.   Microbiology: Recent Results (from the past 720 hour(s))  Surgical pcr screen     Status: None   Collection Time: 01/17/14 12:47 PM  Result Value Ref Range Status   MRSA, PCR NEGATIVE NEGATIVE Final   Staphylococcus aureus NEGATIVE NEGATIVE Final    Comment:        The Xpert SA Assay (FDA approved for NASAL specimens in patients over 76 years of age), is one component of a comprehensive surveillance program.  Test performance has been validated by [facility] for patients greater than or equal to 76 year old. It is not intended to diagnose infection nor to guide or monitor treatment.   Urine culture     Status: None   Collection Time: 01/18/14  1:50 PM  Result Value Ref Range Status   Specimen Description URINE, CLEAN CATCH  Final   Special Requests NONE  Final   Culture  Setup Time   Final    01/18/2014 14:33 Performed at [facility]    Colony Count   Final    75,000 COLONIES/ML Performed at [facility]    Culture   Final    Multiple bacterial morphotypes present, none predominant. Suggest appropriate recollection if clinically indicated. Performed at  [facility]    Report Status 01/19/2014 FINAL  Final    Medical History: Past Medical History  Diagnosis Date  . TIA (transient ischemic attack)   . Hypertension   . Diabetes mellitus without complication     Type 2  . Arthritis   . H/O detached retina repair     Assessment: [age] YOF s/p L carotid Endarterectomy, on zinacef for surgical prophylaxis. Pharmacy is consulted for Antibiotic renal dose adjustment. Scr 1.05, est. crcl ~ 50 ml/min. Pre-op zinacef 1.5g given at 1330.  Goal of Therapy:  Surgical prophylaxis  Plan:  - Zinacef 1.5 g IV Q 12 hrs x 2 as ordered by MD, first dose scheduled at 0100 tomorrow - Pharmacy sign off  Thanks  Bayard HuggerMei Jnya Brossard, PharmD, BCPS  Clinical Pharmacist  Pager: [phone]   01/19/2014,8:58 PM

## 2014-01-19 NOTE — Transfer of Care (Signed)
Immediate Anesthesia Transfer of Care Note  Patient: Kathleen Figueroa  Procedure(s) Performed: Procedure(s): ENDARTERECTOMY CAROTID-LEFT (Left) PATCH ANGIOPLASTY, LEFT CAROTID ARTERY USING HEMASHIELD PLATINUM FINESSE PATCH (Left)  Patient Location: PACU  Anesthesia Type:General  Level of Consciousness: awake, alert , oriented and patient cooperative  Airway & Oxygen Therapy: Patient Spontanous Breathing and Patient connected to nasal cannula oxygen  Post-op Assessment: Report given to PACU RN, Post -op Vital signs reviewed and stable, Patient moving all extremities and Patient able to stick tongue midline  Post vital signs: Reviewed and stable  Complications: No apparent anesthesia complications

## 2014-01-19 NOTE — Anesthesia Preprocedure Evaluation (Signed)
Anesthesia Evaluation  Patient identified by MRN, date of birth, ID band Patient awake    Reviewed: Allergy & Precautions, H&P , NPO status , Patient's Chart, lab work & pertinent test results  History of Anesthesia Complications Negative for: history of anesthetic complications  Airway Mallampati: II  TM Distance: >3 FB Neck ROM: Full    Dental  (+) Edentulous Upper,    Pulmonary neg shortness of breath, neg COPDneg recent URI, former smoker,  breath sounds clear to auscultation        Cardiovascular hypertension, Pt. on medications - angina+ Peripheral Vascular Disease - Past MI and - CHF - dysrhythmias Rhythm:Regular     Neuro/Psych Slight speech slur and perceived right weakness post cva TIACVA, Residual Symptoms negative psych ROS   GI/Hepatic negative GI ROS, Neg liver ROS,   Endo/Other  diabetes, Type 2, Oral Hypoglycemic Agents  Renal/GU negative Renal ROS     Musculoskeletal   Abdominal   Peds  Hematology negative hematology ROS (+)   Anesthesia Other Findings   Reproductive/Obstetrics                             Anesthesia Physical Anesthesia Plan  ASA: III  Anesthesia Plan: General   Post-op Pain Management:    Induction: Intravenous  Airway Management Planned: Oral ETT  Additional Equipment: Arterial line  Intra-op Plan:   Post-operative Plan: Extubation in OR and Possible Post-op intubation/ventilation  Informed Consent: I have reviewed the patients History and Physical, chart, labs and discussed the procedure including the risks, benefits and alternatives for the proposed anesthesia with the patient or authorized representative who has indicated his/her understanding and acceptance.   Dental advisory given  Plan Discussed with: CRNA and Surgeon  Anesthesia Plan Comments:         Anesthesia Quick Evaluation

## 2014-01-19 NOTE — Op Note (Signed)
Patient name: Kathleen BrothersMargie Cech MRN: [id number] DOB: December 30, 1935 Sex: female  01/19/2014 Pre-operative Diagnosis: Symptomatic   left carotid stenosis Post-operative diagnosis:  Same Surgeon:  Jorge NyBRABHAM IV, V. WELLS Assistants:  Lianne CureMaureen Collins Procedure:    left carotid Endarterectomy with bovine pericardial patch angioplasty Anesthesia:  General Blood Loss:  See anesthesia record Specimens:  Carotid Plaque to pathology  Findings:  85 %stenosis; Thrombus:  None  Indications:  This is a 78 year old female who presented to the hospital last week with a left brain stroke.  She was found to have a greater than 80% left carotid stenosis by ultrasound.  She comes in today for endarterectomy.  I discussed the risks and benefits of the operation with the patient and her daughter, including the risk of bleeding, nerve injury, and stroke.  All other questions were answered and they wish to proceed.  The patient reports not having any additional symptoms since her discharge last week.  Procedure:  The patient was identified in the holding area and [facility]  The patient was then placed supine on the table.   General endotrachial anesthesia was administered.  The patient was prepped and draped in the usual sterile fashion.  A time out was called and antibiotics were administered.  The incision was made along the anterior border of the left sternocleidomastoid muscle.  Cautery was used to dissect through the subcutaneous tissue.  The platysma muscle was divided with cautery.  The internal jugular vein was exposed along its anterior medial border.  The common facial vein was exposed and then divided between 2-0 silk ties and metal clips.  The common carotid artery was then circumferentially exposed and encircled with an umbilical tape.  The vagus nerve was identified and protected.  Next sharp dissection was used to expose the external carotid artery and the superior thyroid artery.  The were encircled  with a blue vessel loop and a 2-0 silk tie respectively.  Finally, the internal carotid was carefully dissected free.  An umbilical tape was placed around the internal carotid artery distal to the diseased segment.  The hypoglossal nerve was visualized throughout and protected.  The patient was given systemic heparinization.  A bovine carotid patch was selected and prepared on the back table.  A 10 french shunt was also prepared.  After blood pressure readings were appropriate and the heparin had been given time to circulate, the internal carotid artery was occluded with a baby Gregory clamp.  The external and common carotid arteries were then occluded with vascular clamps and the 2-0 tie tightened on the superior thyroid artery.  A #11 blade was used to make an arteriotomy in the common carotid artery.  This was extended with Potts scissors along the anterior and lateral border of the common and internal carotid artery.  Approximately 85% stenosis was identified.  There was no thrombus identified.  The 10 french shunt was then placed.  A kleiner kuntz elevator was used to perform endarterectomy.  An eversion endarterectomy was performed in the external carotid artery.  A good distal endpoint was obtained in the internal carotid artery.  The specimen was removed and sent to pathology.  Heparinized saline was used to irrigate the endarterectomized field.  All potential embolic debris was removed.  Bovine pericardial patch angioplasty was then performed using a running 6-0 Prolene. Just prior to completion of the repair, the shunt was removed. The common internal and external carotid arteries were all appropriately flushed. The artery  was again irrigated with heparin saline.  The anastomosis was then secured. The clamp was first released on the external carotid artery followed by the common carotid artery approximately 30 seconds later, bloodflow was reestablish through the internal carotid artery.  Next, a hand-held    Doppler was used to evaluate the signals in the common, external, and internal  carotid arteries, all of which had appropriate signals. I then administered  50 mg protamine. The wound was then irrigated.  After hemostasis was achieved, the carotid sheath was reapproximated with 3-0 Vicryl. The  platysma muscle was reapproximated with running 3-0 Vicryl. The skin  was closed with 4-0 Vicryl. Dermabond was placed on the skin. The  patient was then successfully extubated. Her neurologic exam was  similar to his preprocedural exam. The patient was then taken to recovery room  in stable condition. There were no complications.     Disposition:  To PACU in stable condition.  Relevant Operative Details:  Normal carotid anatomy.  All nerves visualized and protected.  10 French shunt placed.  Juleen ChinaV. Wells Evalin Shawhan, M.D. [facility] [place] Office: [phone] Pager:  [phone]

## 2014-01-19 NOTE — Progress Notes (Signed)
S/p left CEA Neuro intact in PACU Incision ok  WellsBrabam

## 2014-01-19 NOTE — Interval H&P Note (Signed)
History and Physical Interval Note:  01/19/2014 12:20 PM  Kathleen Figueroa  has presented today for surgery, with the diagnosis of LEFT INTERNAL CAROTID ARTERY STENOSIS  The various methods of treatment have been discussed with the patient and family. After consideration of risks, benefits and other options for treatment, the patient has consented to  Procedure(s): ENDARTERECTOMY CAROTID-LEFT (Left) as a surgical intervention .  The patient's history has been reviewed, patient examined, no change in status, stable for surgery.  I have reviewed the patient's chart and labs.  Questions were answered to the patient's satisfaction.     [name] IV, V. WELLS

## 2014-01-19 NOTE — Anesthesia Procedure Notes (Signed)
Procedure Name: Intubation Date/Time: 01/19/2014 1:31 PM Performed by: Jerilee HohMUMM, Demetrick Eichenberger N Pre-anesthesia Checklist: Patient identified Patient Re-evaluated:Patient Re-evaluated prior to inductionOxygen Delivery Method: Circle system utilized Preoxygenation: Pre-oxygenation with 100% oxygen Intubation Type: IV induction Ventilation: Mask ventilation without difficulty Laryngoscope Size: Mac and 3 Grade View: Grade I Tube type: Oral Tube size: 7.0 mm Number of attempts: 1 Airway Equipment and Method: Stylet Placement Confirmation: ETT inserted through vocal cords under direct vision,  positive ETCO2 and breath sounds checked- equal and bilateral Secured at: 22 cm Tube secured with: Tape Dental Injury: Teeth and Oropharynx as per pre-operative assessment

## 2014-01-19 NOTE — H&P (View-Only) (Signed)
Consult Note  Patient name: Kathleen Figueroa MRN: [id number] DOB: May 14, 1935 Sex: female  Consulting Physician:  Hospital service  Reason for Consult:  Chief Complaint  Patient presents with  . Stroke Symptoms    HISTORY OF PRESENT ILLNESS: I have been asked to see this 78 year old female for evaluation of carotid stenosis in the setting of stroke.  The patient states that she begin having spasticity and clumsiness after Thanksgiving.  This improved however on Wednesday, 3 days ago it occurred again along with a headache and difficulty reading and slurred speech.  She reports that she had a clumsy right hand.  She went to her primary care doctor who sent her to the emergency department.  She is not a candidate for TPA.  She had a CT scan that showed a possible recent infarct in the left temporal occipital junction with multiple prior small lacunar infarcts in the thalamic and basal ganglia regions.MRI revealed multiple small foci acute infarcts in the left cerebral hemisphere along with chronic infarcts and moderate small vessel ischemic disease there was no major intracranial occlusion.  The patient reports that her symptoms are improving.  The patient suffers from diabetes which she states has been under good control.her most recent hemoglobin A1c was 7.2.her hypercholesterolemia is treated with a statin.  She is also taking medication for blood pressure.  She is a former smoker who quit in 1985  Past Medical History  Diagnosis Date  . TIA (transient ischemic attack)   . Diabetes mellitus without complication   . Hypertension     Past Surgical History  Procedure Laterality Date  . Eye surgery      History   Social History  . Marital Status: Unknown    Spouse Name: N/A    Number of Children: N/A  . Years of Education: N/A   Occupational History  . Not on file.   Social History Main Topics  . Smoking status: Former Smoker    Quit date: 02/10/1983  . Smokeless  tobacco: Not on file  . Alcohol Use: No  . Drug Use: Not on file  . Sexual Activity: Not on file   Other Topics Concern  . Not on file   Social History Narrative  . No narrative on file    Family History  Problem Relation Age of Onset  . Hypertension Mother   . Hypertension Father     Allergies as of 01/12/2014 - Review Complete 01/12/2014  Allergen Reaction Noted  . Aspirin Nausea Only 01/12/2014    Medications:  Prior to Admission medications   Medication Sig Start Date End Date Taking? Authorizing Provider  amLODipine (NORVASC) 10 MG tablet Take 10 mg by mouth daily.   Yes Historical Provider, MD  atorvastatin (LIPITOR) 20 MG tablet Take 20 mg by mouth daily.   Yes Historical Provider, MD  metFORMIN (GLUCOPHAGE) 1000 MG tablet Take 1,000 mg by mouth daily with breakfast.   Yes Historical Provider, MD  valsartan (DIOVAN) 320 MG tablet Take 320 mg by mouth daily.   Yes Historical Provider, MD    REVIEW OF SYSTEMS: Constitutional: Denies fever, chills, diaphoresis, poor appetite and fatigue.  HEENT: Denies photophobia, eye pain, redness, hearing loss, ear pain, congestion, sore throat, rhinorrhea, sneezing, mouth sores, trouble swallowing, neck pain, neck stiffness and tinnitus.  Respiratory: Denies SOB, DOE, cough, chest tightness, and wheezing.  Cardiovascular: Denies chest pain, palpitations and leg swelling.  Gastrointestinal: Denies nausea, vomiting, abdominal pain, diarrhea, constipation,  blood in stool and abdominal distention.  Genitourinary: Denies dysuria, urgency, frequency, hematuria, flank pain and difficulty urinating.  Musculoskeletal: Denies myalgias, back pain, joint swelling, arthralgias and gait problem.  Skin: Denies pallor, rash and wound.  Neurological:Please see history of present illness  Hematological: Denies adenopathy. Easy bruising, personal or family bleeding history  Psychiatric/Behavioral: Denies suicidal  ideation, mood changes, confusion, nervousness, sleep disturbance and agitation   PHYSICAL EXAMINATION: General: The patient appears their stated age.  Vital signs are BP 156/68 mmHg  Pulse 63  Temp(Src) 99 F (37.2 C) (Oral)  Resp 18  Ht 5\' 6"  (1.676 m)  Wt 178 lb 1.6 oz (80.786 kg)  BMI 28.76 kg/m2  SpO2 99% Pulmonary: Respirations are non-labored HEENT:  No gross abnormalities Abdomen: Soft and non-tender  Musculoskeletal: There are no major deformities.   Neurologic: No focal weakness or paresthesias are detected,she now has good strength in both extremities.  She has normal sensation on her face.  Smile was symmetric. Skin: There are no ulcer or rashes noted. Psychiatric: The patient has normal affect. Cardiovascular: There is a regular rate and rhythm without significant murmur appreciated.  Diagnostic Studies: Carotid Doppler studies show 80-99 percent left carotid stenosis and 1-39 percent right carotid stenosis.  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  I discussed the risks and benefits of each option.  I feel that she is best suited for left carotid endarterectomy.  I discussed the risks and benefits which include but are not limited to bleeding, cardiopulmonary complications, nerve injury, and stroke.  I am okay with starting her on Plavix.  I did discuss that this may increase her risk of bleeding, however I think it is appropriate in this setting.  I will plan on a interval left carotid endarterectomy towards the end of this week or the following week.  It is okay for her to be discharged home and return for operation once this is been scheduled.     Jorge NyV. Wells Brabham IV, M.D. [facility] [place] Office:  [phone] Pager:  [phone]

## 2014-01-20 LAB — BASIC METABOLIC PANEL
Anion gap: 14 (ref 5–15)
BUN: 10 mg/dL (ref 6–23)
CHLORIDE: 101 meq/L (ref 96–112)
CO2: 24 meq/L (ref 19–32)
CREATININE: 0.69 mg/dL (ref 0.50–1.10)
Calcium: 8.7 mg/dL (ref 8.4–10.5)
GFR calc Af Amer: >90 mL/min (ref >90)
GFR calc non Af Amer: 82 mL/min — ABNORMAL LOW (ref >90)
Glucose, Bld: 168 mg/dL — ABNORMAL HIGH (ref 70–99)
POTASSIUM: 4 meq/L (ref 3.7–5.3)
Sodium: 139 mEq/L (ref 137–147)

## 2014-01-20 LAB — HEMOGLOBIN A1C
Hgb A1c MFr Bld: 7.3 % — ABNORMAL HIGH (ref <5.7)
MEAN PLASMA GLUCOSE: 163 mg/dL — AB (ref <117)

## 2014-01-20 LAB — CBC
HCT: 39.3 % (ref 36.0–46.0)
Hemoglobin: 12.6 g/dL (ref 12.0–15.0)
MCH: 26.6 pg (ref 26.0–34.0)
MCHC: 32.1 g/dL (ref 30.0–36.0)
MCV: 83.1 fL (ref 78.0–100.0)
PLATELETS: 219 10*3/uL (ref 150–400)
RBC: 4.73 MIL/uL (ref 3.87–5.11)
RDW: 14 % (ref 11.5–15.5)
WBC: 6.9 10*3/uL (ref 4.0–10.5)

## 2014-01-20 LAB — GLUCOSE, CAPILLARY
GLUCOSE-CAPILLARY: 106 mg/dL — AB (ref 70–99)
Glucose-Capillary: 152 mg/dL — ABNORMAL HIGH (ref 70–99)

## 2014-01-20 NOTE — Progress Notes (Signed)
   VASCULAR SURGERY ASSESSMENT & PLAN:  * 1 Day Post-Op s/p: Left CEA  *  Doing well.   * D/C JP  * Home today.  SUBJECTIVE: No complaints  PHYSICAL EXAM: Filed Vitals:   01/19/14 2015 01/19/14 2035 01/19/14 2314 01/20/14 0353  BP:  136/63 147/66 143/59  Pulse:  86 89 72  Temp: 97.9 F (36.6 C) 99.2 F (37.3 C) 98 F (36.7 C) 97.9 F (36.6 C)  TempSrc:  Oral Oral Oral  Resp:  12 15 12   Height:  5\' 6"  (1.676 m)    Weight:  181 lb 7 oz (82.3 kg)    SpO2:  100% 100% 99%   Neuro intact Incision looks fine  LABS: Lab Results  Component Value Date   WBC 7.9 01/19/2014   HGB 12.9 01/19/2014   HCT 40.1 01/19/2014   MCV 82.9 01/19/2014   PLT 202 01/19/2014   Lab Results  Component Value Date   CREATININE 0.82 01/19/2014   Lab Results  Component Value Date   INR 0.95 01/17/2014   CBG (last 3)   Recent Labs  01/19/14 1110 01/19/14 1302 01/19/14 1605  GLUCAP 107* 83 123*    Active Problems:   Carotid stenosis   Cari CarawayChris Samiyah Stupka Beeper: [phone] 01/20/2014

## 2014-01-20 NOTE — Progress Notes (Signed)
Discharge instructions given. No questions or concerns at this time. Pt discharging via family vehicle. Awaiting ride.

## 2014-01-20 NOTE — Anesthesia Postprocedure Evaluation (Signed)
  Anesthesia Post-op Note  Patient: Kathleen Figueroa  Procedure(s) Performed: Procedure(s): ENDARTERECTOMY CAROTID-LEFT (Left) PATCH ANGIOPLASTY, LEFT CAROTID ARTERY USING HEMASHIELD PLATINUM FINESSE PATCH (Left)  Patient Location: PACU  Anesthesia Type:General  Level of Consciousness: awake  Airway and Oxygen Therapy: Patient Spontanous Breathing  Post-op Pain: mild  Post-op Assessment: Post-op Vital signs reviewed, Patient's Cardiovascular Status Stable, Respiratory Function Stable, Patent Airway, No signs of Nausea or vomiting and Pain level controlled  Post-op Vital Signs: Reviewed and stable  Last Vitals:  Filed Vitals:   01/20/14 0353  BP: 143/59  Pulse: 72  Temp: 36.6 C  Resp: 12    Complications: No apparent anesthesia complications

## 2014-01-22 ENCOUNTER — Encounter (HOSPITAL_COMMUNITY): Payer: Self-pay | Admitting: Surgery

## 2014-01-22 NOTE — Care Management Note (Signed)
    Page 1 of 1   01/22/2014     1:46:58 PM CARE MANAGEMENT NOTE 01/22/2014  Patient:  Kathleen Figueroa,Kathleen Figueroa   Account Number:  [id number]  Date Initiated:  01/22/2014  Documentation initiated by:  Donn PieriniWEBSTER,Ainhoa Rallo  Subjective/Objective Assessment:   Pt admitted s/p carotid     Action/Plan:   PTA pt lived at home   Anticipated DC Date:  01/20/2014   Anticipated DC Plan:  HOME W HOME HEALTH SERVICES      DC Planning Services  CM consult      [facility]   Choice offered to / List presented to:  C-1 Patient        HH arranged  HH-5 SPEECH THERAPY      HH agency  [facility]   Status of service:  Completed, signed off Medicare Important Message given?  NA - LOS <3 / Initial given by admissions (If response is "NO", the following Medicare IM given date fields will be blank) Date Medicare IM given:   Medicare IM given by:   Date Additional Medicare IM given:   Additional Medicare IM given by:    Discharge Disposition:  HOME W HOME HEALTH SERVICES  Per UR Regulation:  Reviewed for med. necessity/level of care/duration of stay  If discussed at Long Length of Stay Meetings, dates discussed:    Comments:  01/22/14- 1344- Donn PieriniKristi Jeremiyah Cullens RN< BSN [phone] Post discharge call made to pt- [facility]- per conversation with pt she states she still has trouble with her speech and would like HH- [facility]- and [facility]- [facility]- services to begin within next 24-48 hr.

## 2014-01-22 NOTE — Progress Notes (Signed)
Utilization review completed- retro 

## 2014-01-31 ENCOUNTER — Telehealth: Payer: Self-pay | Admitting: Surgery

## 2014-01-31 NOTE — Telephone Encounter (Addendum)
Spoke to pt to sch appt  sched post op w/ [facility] on 02/12/13 @ 3:30  ----- Message from Sharee PimpleMarilyn K McChesney, RN sent at 01/31/2014  9:38 AM EST ----- Regarding: RE: CEA f/u? You can do 1st week of Jan. There is no discharge note in the system.  ----- Message -----    From: Jena GaussMichele A Roczniak    Sent: 01/29/2014   3:49 PM      To: Sharee PimpleMarilyn K McChesney, RN Subject: CE[facility] f/u?                                       [facility] preformed at [facility] on 01/19/14. Do we need a 2 w f/u?  Thank you, Elon JesterMichele

## 2014-02-06 ENCOUNTER — Encounter: Payer: Self-pay | Admitting: Surgery

## 2014-02-12 ENCOUNTER — Ambulatory Visit (INDEPENDENT_AMBULATORY_CARE_PROVIDER_SITE_OTHER): Payer: Self-pay | Admitting: Surgery

## 2014-02-12 ENCOUNTER — Encounter: Payer: Self-pay | Admitting: Surgery

## 2014-02-12 VITALS — BP 127/63 | HR 76 | Temp 98.2°F | Resp 14 | Ht 66.0 in | Wt 180.0 lb

## 2014-02-12 DIAGNOSIS — I639 Cerebral infarction, unspecified: Secondary | ICD-10-CM

## 2014-02-12 DIAGNOSIS — Z48812 Encounter for surgical aftercare following surgery on the circulatory system: Secondary | ICD-10-CM

## 2014-02-12 DIAGNOSIS — I6523 Occlusion and stenosis of bilateral carotid arteries: Secondary | ICD-10-CM

## 2014-02-12 HISTORY — DX: Cerebral infarction, unspecified: I63.9

## 2014-02-12 HISTORY — DX: Occlusion and stenosis of bilateral carotid arteries: I65.23

## 2014-02-12 NOTE — Addendum Note (Signed)
Addended by: Sharee Pimple on: 02/12/2014 04:46 PM   Modules accepted: Orders

## 2014-02-12 NOTE — Progress Notes (Signed)
The patient is back for her first postoperative follow-up.  She initially presented to the hospital with a clumsy right hand.  She also had garbled speech.  She ended up being diagnosed with a left brain infarct.  Carotid studies revealed greater than 80% left carotid stenosis.  On 01/19/2014, the patient underwent left carotid endarterectomy with bovine pericardial patch angioplasty.  Intraoperative findings included normal anatomy with an 85% stenosis.  The patient's postoperative course was uncomplicated.  She reports today that she is not having any neurologic deficits.  Phoebe Perch Bijan reading have improved significantly.  She continues to participate in therapy.  The left neck incision is healing nicely.  Neurologic examination is improved from preop.  The patient will follow-up in 6 months with a repeat carotid ultrasound.  We did discuss the importance of a healthy diet.  She is trying to eat a vegetable/plan based diet.

## 2014-02-13 MED ORDER — CLOPIDOGREL BISULFATE 75 MG PO TABS: 75.0000 mg | ORAL_TABLET | Freq: Every day | ORAL | Status: DC

## 2014-02-13 NOTE — Addendum Note (Signed)
Addended by: Sharee PimpleMCCHESNEY, MARILYN K on: 02/13/2014 02:10 PM   Modules accepted: Orders

## 2014-02-19 NOTE — Discharge Summary (Signed)
[facility] Discharge Summary   Patient ID:  Kathleen Figueroa MRN: [id number] DOB/AGE: Oct 06, 1935 79 y.o.  Admit date: 01/19/2014 Discharge date: 02/19/2014 Date of Surgery: 01/19/2014 Surgeon: Surgeon(s): Nada Libman, MD  Admission Diagnosis: LEFT INTERNAL CAROTID ARTERY STENOSIS  Discharge Diagnoses:  LEFT INTERNAL CAROTID ARTERY STENOSIS  Secondary Diagnoses: Past Medical History  Diagnosis Date  . TIA (transient ischemic attack)   . Hypertension   . Diabetes mellitus without complication     Type 2  . Arthritis   . H/O detached retina repair     Procedure(s): ENDARTERECTOMY CAROTID-LEFT PATCH ANGIOPLASTY, LEFT CAROTID ARTERY USING HEMASHIELD PLATINUM FINESSE PATCH  Discharged Condition: good  HPI: this 79 year old female for evaluation of carotid stenosis in the setting of stroke. The patient states that she begin having spasticity and clumsiness after Thanksgiving. This improved however on Wednesday, 3 days ago it occurred again along with a headache and difficulty reading and slurred speech. She reports that she had a clumsy right hand. She went to her primary care doctor who sent her to the emergency department. She is not a candidate for TPA. She had a CT scan that showed a possible recent infarct in the left temporal occipital junction with multiple prior small lacunar infarcts in the thalamic and basal ganglia regions.MRI revealed multiple small foci acute infarcts in the left cerebral hemisphere along with chronic infarcts and moderate small vessel ischemic disease there was no major intracranial occlusion. The patient reports that her symptoms are improving.  She was discharged from [facility] on 01/14/2014 and returned 01/19/2014 for left carotid Endarterectomy with bovine pericardial patch angioplasty   Hospital Course:  Kathleen Figueroa is a 79 y.o. female is S/P Left Procedure(s): ENDARTERECTOMY CAROTID-LEFT PATCH ANGIOPLASTY,  LEFT CAROTID ARTERY USING HEMASHIELD PLATINUM FINESSE PATCH Post-op day one the JP drain was discharged.  Her stay was uneventful.  She was ambulating, taking PO's well and has voided independently.  She was discharged home in stable condition.    Significant Diagnostic Studies: CBC Lab Results  Component Value Date   WBC 6.9 01/20/2014   HGB 12.6 01/20/2014   HCT 39.3 01/20/2014   MCV 83.1 01/20/2014   PLT 219 01/20/2014    BMET    Component Value Date/Time   NA 139 01/20/2014 1024   K 4.0 01/20/2014 1024   CL 101 01/20/2014 1024   CO2 24 01/20/2014 1024   GLUCOSE 168* 01/20/2014 1024   BUN 10 01/20/2014 1024   CREATININE 0.69 01/20/2014 1024   CALCIUM 8.7 01/20/2014 1024   GFRNONAA 82* 01/20/2014 1024   GFRAA >90 01/20/2014 1024   COAG Lab Results  Component Value Date   INR 0.95 01/17/2014   INR 0.99 01/12/2014     Disposition:  Discharge to :Home Discharge Instructions    Call MD for:  redness, tenderness, or signs of infection (pain, swelling, bleeding, redness, odor or green/yellow discharge around incision site)    Complete by:  As directed      Call MD for:  severe or increased pain, loss or decreased feeling  in affected limb(s)    Complete by:  As directed      Call MD for:  temperature >100.5    Complete by:  As directed      Discharge instructions    Complete by:  As directed   You may shower in 48 hours after surgery     Driving Restrictions    Complete by:  As directed  No driving for 2 weeks     Increase activity slowly    Complete by:  As directed   Walk with assistance use walker or cane as needed     Lifting restrictions    Complete by:  As directed   No lifting for 6 weeks     Resume previous diet    Complete by:  As directed             Medication List    STOP taking these medications        clopidogrel 75 MG tablet  Commonly known as:  PLAVIX      TAKE these medications        amLODipine 10 MG tablet  Commonly known as:   NORVASC  Take 10 mg by mouth daily.     aspirin 81 MG EC tablet  Take 1 tablet (81 mg total) by mouth daily.     atorvastatin 40 MG tablet  Commonly known as:  LIPITOR  Take 1 tablet (40 mg total) by mouth daily.     metFORMIN 1000 MG tablet  Commonly known as:  GLUCOPHAGE  Take 1,000 mg by mouth daily with breakfast.     oxyCODONE-acetaminophen 5-325 MG per tablet  Commonly known as:  PERCOCET/ROXICET  Take 1 tablet by mouth every 6 (six) hours as needed.     valsartan 320 MG tablet  Commonly known as:  DIOVAN  Take 320 mg by mouth daily.       Verbal and written Discharge instructions given to the patient. Wound care per Discharge AVS   Signed: Clinton GallantCOLLINS, Jamine [facility] ServicesMAUREEN 02/19/2014, 1:11 PM  --- For VQI Registry use --- Instructions: Press F2 to tab through selections.  Delete question if not applicable.   Modified Rankin score at D/C (0-6): Rankin Score=0  IV medication needed for:  1. Hypertension: No 2. Hypotension: No  Post-op Complications: No  1. Post-op CVA or TIA: No  If yes: Event classification (right eye, left eye, right cortical, left cortical, verterobasilar, other):   If yes: Timing of event (intra-op, <6 hrs post-op, >=6 hrs post-op, unknown):   2. CN injury: No  If yes: CN  injuried   3. Myocardial infarction: No  If yes: Dx by (EKG or clinical, Troponin):   4.  CHF: No  5.  Dysrhythmia (new): No  6. Wound infection: No  7. Reperfusion symptoms: No  8. Return to OR: No  If yes: return to OR for (bleeding, neurologic, other CEA incision, other):   Discharge medications: Statin use:  Yes ASA use:  Yes Beta blocker use:  No  for medical reason   ACE-Inhibitor use:  No  for medical reason   P2Y12 Antagonist use: [ ]  None, [x ] Plavix, [ ]  Plasugrel, [ ]  Ticlopinine, [ ]  Ticagrelor, [ ]  Other, [ ]  No for medical reason, [ ]  Non-compliant, [ ]  Not-indicated Anti-coagulant use:  [ ]  None, [ ]  Warfarin, [ ]  Rivaroxaban, [ ]  Dabigatran, [ ]   Other, [ ]  No for medical reason, [ ]  Non-compliant, [ ]  Not-indicated

## 2014-04-05 ENCOUNTER — Ambulatory Visit: Payer: Self-pay | Admitting: Neurology

## 2014-04-06 ENCOUNTER — Encounter: Payer: Self-pay | Admitting: Neurology

## 2014-04-24 DIAGNOSIS — I10 Essential (primary) hypertension: Secondary | ICD-10-CM | POA: Diagnosis not present

## 2014-04-24 DIAGNOSIS — E780 Pure hypercholesterolemia: Secondary | ICD-10-CM | POA: Diagnosis not present

## 2014-04-24 DIAGNOSIS — E119 Type 2 diabetes mellitus without complications: Secondary | ICD-10-CM | POA: Diagnosis not present

## 2014-05-16 ENCOUNTER — Ambulatory Visit: Payer: Self-pay | Admitting: Neurology

## 2014-05-28 ENCOUNTER — Encounter: Payer: Self-pay | Admitting: Neurology

## 2014-05-28 ENCOUNTER — Ambulatory Visit (INDEPENDENT_AMBULATORY_CARE_PROVIDER_SITE_OTHER): Payer: Commercial Managed Care - HMO | Admitting: Neurology

## 2014-05-28 VITALS — BP 141/79 | HR 81 | Ht 66.0 in | Wt 175.6 lb

## 2014-05-28 DIAGNOSIS — I10 Essential (primary) hypertension: Secondary | ICD-10-CM | POA: Diagnosis not present

## 2014-05-28 DIAGNOSIS — E119 Type 2 diabetes mellitus without complications: Secondary | ICD-10-CM | POA: Diagnosis not present

## 2014-05-28 DIAGNOSIS — Z9889 Other specified postprocedural states: Secondary | ICD-10-CM | POA: Insufficient documentation

## 2014-05-28 DIAGNOSIS — I63132 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: Secondary | ICD-10-CM | POA: Diagnosis not present

## 2014-05-28 DIAGNOSIS — E785 Hyperlipidemia, unspecified: Secondary | ICD-10-CM | POA: Diagnosis not present

## 2014-05-28 NOTE — Patient Instructions (Signed)
-   discontinue baby ASA - continue plavix and lipitor for stroke prevention - check BP and glucose at home - Follow up with your primary care physician for stroke risk factor modification. Recommend maintain blood pressure goal <130/80, diabetes with hemoglobin A1c goal below 6.5% and lipids with LDL cholesterol goal below 70 mg/dL.  - follow up with vascular surgery in July - continue exercise - follow up in 6 months.

## 2014-05-28 NOTE — Progress Notes (Signed)
STROKE NEUROLOGY FOLLOW UP NOTE  NAME: Kathleen Figueroa DOB: [birth year]  REASON FOR VISIT: stroke follow up HISTORY FROM: pt and chart  Today we had the pleasure of seeing Kathleen Figueroa in follow-up at our Neurology Clinic. Pt was accompanied by no one.   History Summary 79 y.o. female with known risk factors of HTN, DM and hyperlipidemia was admitted to [facility] due to right hand and arm clumsiness, weakness and slurry speech on 01/12/14. Her symptoms resolved quickly. She also had one episode of right hand clumsiness days prior to the admission but resolved and she did not seek medical attention. MRI showed multiple small foci of acute infarction in the left cerebral hemisphere, and CUS showed left ICA 80-99% stenosis and right 1-39% stenosis. She was put on ASA and plavix and had vascular surgery consult. She was discharged with dural antiplatelet and had CEA on 01/19/14 by Dr. Myra Gianotti. No complications.  Interval History During the interval time, the patient has been doing well. No recurrent stroke like symptoms. She followed up with Dr. Myra Gianotti in Jan. And wound healing well and she was instructed to follow up in 6 months to repeat CUS. Her BP today 141/79, and she saw her PCP last month, and was told lab tests were all good. She is still on dural antiplatelet up to now.  REVIEW OF SYSTEMS: Full 14 system review of systems performed and notable only for those listed below and in HPI above, all others are negative:  Constitutional:  fatigue Cardiovascular:  Ear/Nose/Throat:  Hearing loss Skin:  Eyes:   Respiratory:   Gastroitestinal:   Genitourinary:  Hematology/Lymphatic:  Easy bleeding Endocrine:  Musculoskeletal:  joing pain, aching muscles Allergy/Immunology:  allergies Neurological:   Psychiatric:  Sleep: sleepiness  The following represents the patient's updated allergies and side effects list: Allergies  Allergen Reactions  . Aspirin Nausea Only    Higher dose    The  neurologically relevant items on the patient's problem list were reviewed on today's visit.  Neurologic Examination  A problem focused neurological exam (12 or more points of the single system neurologic examination, vital signs counts as 1 point, cranial nerves count for 8 points) was performed.  Blood pressure 141/79, pulse 81, height  (1.676 m), weight 175 lb 9.6 oz (79.652 kg).  General - Well nourished, well developed, in no apparent distress.  Ophthalmologic - Sharp disc margins OU.  Cardiovascular - Regular rate and rhythm with no murmur.  Mental Status -  Level of arousal and orientation to time, place, and person were intact. Language including expression, naming, repetition, comprehension, reading, and writing was assessed and found intact. Attention span and concentration were normal. Recent and remote memory were intact. Fund of Knowledge was assessed and was intact.  Cranial Nerves II - XII - II - Visual field intact OU. III, IV, VI - Extraocular movements intact. V - Facial sensation intact bilaterally. VII - Facial movement intact bilaterally. VIII - Hearing & vestibular intact bilaterally. X - Palate elevates symmetrically. XI - Chin turning & shoulder shrug intact bilaterally. XII - Tongue protrusion intact.  Motor Strength - The patient's strength was normal in all extremities and pronator drift was absent.  Bulk was normal and fasciculations were absent.   Motor Tone - Muscle tone was assessed at the neck and appendages and was normal.  Reflexes - The patient's reflexes were normal in all extremities and she had no pathological reflexes.  Sensory - Light touch, temperature/pinprick were assessed and  were normal.    Coordination - The patient had normal movements in the hands and feet with no ataxia or dysmetria.  Tremor was absent.  Gait and Station - The patient's transfers, posture, gait, station, and turns were observed as normal.  Data  reviewed: I personally reviewed the images and agree with the radiology interpretations.  Carotid Dopplers 01/12/2014 Preliminary findings: Right ICA: 1-39% stenosis. Left ICA: 80-99% stenosis. Bilateral vertebral: patent with antegrade flow.   Ct Head Wo Contrast 01/12/2014  Age uncertain and possibly recent infarct at the left temporal -occipital junction. There is atrophy with multiple prior small lacunar infarcts in the thalamic and basal ganglia regions as well as moderate small vessel disease in the centra semiovale. Mild generalized atrophy. No hemorrhage or mass effect.   Mr Brain Wo Contrast 01/12/2014  1. Multiple small foci of acute infarction in the left cerebral hemisphere, possibly watershed.  2. Chronic infarcts and moderate chronic small vessel ischemic disease.  Mr Maxine GlennMra Head/brain Wo Cm 01/12/2014  No evidence of major intracranial arterial occlusion or high-grade proximal intracranial stenosis. Decreased caliber of the distal left internal carotid artery may indicate a flow limiting stenosis more proximally in the neck.   LDL 135 and A1C 7.2  Assessment: As you may recall, she is a 79 y.o. African American female with PMH of HTN, DM, HLD was admitted on 01/12/14 for multifocal acute small infarct at left hemisphere, embolic pattern, on MRI. CUS showed left ICA 80-99% stenosis. She was put on dural antiplatelet and had left CEA on 01/19/14 and recovered well so far. Her BP is good and follows with PCP for stroke risk factor modification. Currently still on dural antiplatelet.  Plan:  - will d/c ASA 81 - continue plavix and lipitor for stroke prevention. - check BP and glucose at home - Follow up with your primary care physician for stroke risk factor modification. Recommend maintain blood pressure goal <130/80, diabetes with hemoglobin A1c goal below 6.5% and lipids with LDL cholesterol goal below 70 mg/dL.  - follow up with vascular surgery in July - RTC in 6  months.  No orders of the defined types were placed in this encounter.    Meds ordered this encounter  Medications  . BAYER CONTOUR NEXT TEST test strip    Sig:     Patient Instructions  - discontinue baby ASA - continue plavix and lipitor for stroke prevention - check BP and glucose at home - Follow up with your primary care physician for stroke risk factor modification. Recommend maintain blood pressure goal <130/80, diabetes with hemoglobin A1c goal below 6.5% and lipids with LDL cholesterol goal below 70 mg/dL.  - follow up with vascular surgery in July - continue exercise - follow up in 6 months.   Marvel PlanJindong Thatcher Doberstein, MD PhD [facility] [address], [address] [place], [place] [address] [phone]

## 2014-08-20 ENCOUNTER — Ambulatory Visit: Payer: Medicare PPO | Admitting: Surgery

## 2014-08-20 ENCOUNTER — Encounter (HOSPITAL_COMMUNITY): Payer: Medicare PPO

## 2014-08-30 ENCOUNTER — Encounter: Payer: Self-pay | Admitting: Surgery

## 2014-09-03 ENCOUNTER — Ambulatory Visit: Payer: Medicare PPO | Admitting: Surgery

## 2014-09-03 ENCOUNTER — Encounter (HOSPITAL_COMMUNITY): Payer: Medicare PPO

## 2014-10-22 DIAGNOSIS — E119 Type 2 diabetes mellitus without complications: Secondary | ICD-10-CM | POA: Diagnosis not present

## 2014-10-22 DIAGNOSIS — Z8673 Personal history of transient ischemic attack (TIA), and cerebral infarction without residual deficits: Secondary | ICD-10-CM | POA: Diagnosis not present

## 2014-10-22 DIAGNOSIS — I10 Essential (primary) hypertension: Secondary | ICD-10-CM | POA: Diagnosis not present

## 2014-10-22 DIAGNOSIS — Z9889 Other specified postprocedural states: Secondary | ICD-10-CM | POA: Diagnosis not present

## 2014-10-22 DIAGNOSIS — E780 Pure hypercholesterolemia: Secondary | ICD-10-CM | POA: Diagnosis not present

## 2014-10-31 ENCOUNTER — Encounter: Payer: Self-pay | Admitting: Surgery

## 2014-11-05 ENCOUNTER — Ambulatory Visit: Payer: Commercial Managed Care - HMO | Admitting: Surgery

## 2014-11-05 ENCOUNTER — Ambulatory Visit (HOSPITAL_COMMUNITY)
Admission: RE | Admit: 2014-11-05 | Discharge: 2014-11-05 | Disposition: A | Discharge status: Home / Self Care | Payer: Commercial Managed Care - HMO | Source: Ambulatory Visit | Attending: Surgery | Admitting: Surgery

## 2014-11-05 DIAGNOSIS — I639 Cerebral infarction, unspecified: Secondary | ICD-10-CM

## 2014-11-05 DIAGNOSIS — Z48812 Encounter for surgical aftercare following surgery on the circulatory system: Secondary | ICD-10-CM | POA: Diagnosis not present

## 2014-11-05 DIAGNOSIS — I6523 Occlusion and stenosis of bilateral carotid arteries: Secondary | ICD-10-CM

## 2014-12-10 ENCOUNTER — Ambulatory Visit: Payer: Commercial Managed Care - HMO | Admitting: Neurology

## 2014-12-24 ENCOUNTER — Ambulatory Visit: Payer: Commercial Managed Care - HMO | Admitting: Surgery

## 2014-12-24 ENCOUNTER — Encounter (HOSPITAL_COMMUNITY): Payer: Commercial Managed Care - HMO

## 2015-01-07 ENCOUNTER — Ambulatory Visit: Payer: Commercial Managed Care - HMO | Admitting: Surgery

## 2015-01-14 ENCOUNTER — Ambulatory Visit (HOSPITAL_COMMUNITY)
Admission: RE | Admit: 2015-01-14 | Discharge: 2015-01-14 | Disposition: A | Discharge status: Home / Self Care | Payer: Commercial Managed Care - HMO | Source: Ambulatory Visit | Attending: Surgery | Admitting: Surgery

## 2015-01-14 DIAGNOSIS — E119 Type 2 diabetes mellitus without complications: Secondary | ICD-10-CM | POA: Insufficient documentation

## 2015-01-14 DIAGNOSIS — I6523 Occlusion and stenosis of bilateral carotid arteries: Secondary | ICD-10-CM | POA: Insufficient documentation

## 2015-01-14 DIAGNOSIS — I10 Essential (primary) hypertension: Secondary | ICD-10-CM | POA: Diagnosis not present

## 2015-01-14 DIAGNOSIS — Z48812 Encounter for surgical aftercare following surgery on the circulatory system: Secondary | ICD-10-CM | POA: Insufficient documentation

## 2015-01-14 DIAGNOSIS — I639 Cerebral infarction, unspecified: Secondary | ICD-10-CM | POA: Diagnosis not present

## 2015-01-15 ENCOUNTER — Encounter: Payer: Self-pay | Admitting: Surgery

## 2015-01-21 ENCOUNTER — Encounter: Payer: Self-pay | Admitting: Surgery

## 2015-01-21 ENCOUNTER — Ambulatory Visit: Payer: Commercial Managed Care - HMO | Admitting: Surgery

## 2015-01-21 ENCOUNTER — Ambulatory Visit (INDEPENDENT_AMBULATORY_CARE_PROVIDER_SITE_OTHER): Payer: Commercial Managed Care - HMO | Admitting: Surgery

## 2015-01-21 VITALS — BP 135/76 | HR 78 | Ht 66.0 in | Wt 175.3 lb

## 2015-01-21 DIAGNOSIS — I6522 Occlusion and stenosis of left carotid artery: Secondary | ICD-10-CM

## 2015-01-21 NOTE — Addendum Note (Signed)
Addended by: Adria DillELDRIDGE-LEWIS, Daeshon Grammatico L on: 01/21/2015 04:17 PM   Modules accepted: Orders

## 2015-01-21 NOTE — Progress Notes (Signed)
Patient name: Kathleen Figueroa MRN: [id number] DOB: [birth year] Sex: female     Chief Complaint  Patient presents with  . Re-evaluation    6 month f/u - carotid    HISTORY OF PRESENT ILLNESS: The patient is back for  follow-up. She initially presented to the hospital with a clumsy right hand. She also had garbled speech. She ended up being diagnosed with a left brain infarct. Carotid studies revealed greater than 80% left carotid stenosis. On 01/19/2014, the patient underwent left carotid endarterectomy with bovine pericardial patch angioplasty. Intraoperative findings included normal anatomy with an 85% stenosis. The patient's postoperative course was uncomplicated.  She has no residual neurologic defects except for the fact that she will occasionally makes up her B's and D's.   Past Medical History  Diagnosis Date  . TIA (transient ischemic attack)   . Hypertension   . Diabetes mellitus without complication (HCC)     Type 2  . Arthritis   . H/O detached retina repair     Past Surgical History  Procedure Laterality Date  . Eye surgery      lens removed from right eye  . Tonsillectomy    . Endarterectomy Left 01/19/2014    Procedure: ENDARTERECTOMY CAROTID-LEFT;  Surgeon: Nada Libman, MD;  Location: [facility] OR;  Service: Vascular;  Laterality: Left;  . Patch angioplasty Left 01/19/2014    Procedure: PATCH ANGIOPLASTY, LEFT CAROTID ARTERY USING HEMASHIELD PLATINUM FINESSE PATCH;  Surgeon: Nada Libman, MD;  Location: [facility] OR;  Service: Vascular;  Laterality: Left;    Social History   Social History  . Marital Status: Unknown    Spouse Name: N/A  . Number of Children: 5  . Years of Education: 2 yrs coll   Occupational History  . retired    Social History Main Topics  . Smoking status: Former Smoker    Quit date: 02/09/1978  . Smokeless tobacco: Never Used  . Alcohol Use: No  . Drug Use: No  . Sexual Activity: Not on file   Other Topics Concern  . Not on  file   Social History Narrative   Caffeine (low caffeine), some chocolate.    Family History  Problem Relation Age of Onset  . Hypertension Mother   . Diabetes Mother   . Hypertension Father     Allergies as of 01/21/2015 - Review Complete 01/21/2015  Allergen Reaction Noted  . Aspirin Nausea Only 01/12/2014    Current Outpatient Prescriptions on File Prior to Visit  Medication Sig Dispense Refill  . amLODipine (NORVASC) 10 MG tablet Take 10 mg by mouth daily.    Marland Kitchen aspirin EC 81 MG EC tablet Take 1 tablet (81 mg total) by mouth daily. 30 tablet 3  . atorvastatin (LIPITOR) 40 MG tablet Take 1 tablet (40 mg total) by mouth daily. 30 tablet 2  . BAYER CONTOUR NEXT TEST test strip     . clopidogrel (PLAVIX) 75 MG tablet Take 1 tablet (75 mg total) by mouth daily. 30 tablet 10  . metFORMIN (GLUCOPHAGE) 1000 MG tablet Take 1,000 mg by mouth daily with breakfast.    . valsartan (DIOVAN) 320 MG tablet Take 320 mg by mouth daily.     No current facility-administered medications on file prior to visit.     REVIEW OF SYSTEMS: See history of present illness otherwise negative  PHYSICAL EXAMINATION:   Vital signs are  Filed Vitals:   01/21/15 1503 01/21/15 1505  BP: 140/72 135/76  Pulse:  78   Height: 5\' 6"  (1.676 m)   Weight: 175 lb 4.8 oz (79.516 kg)   SpO2: 100%    Body mass index is 28.31 kg/(m^2). General: The patient appears their stated age. HEENT:  No gross abnormalities Pulmonary:  Non labored breathing Musculoskeletal: There are no major deformities. Neurologic: No focal weakness or paresthesias are detected, Skin: There are no ulcer or rashes noted. Psychiatric: The patient has normal affect. Cardiovascular: Carotid endarterectomie incision is well-healed   Diagnostic Studies I have ordered and reviewed her duplex.  There is less than 40% stenosis at the left carotid endarterectomy site as well as within the right native carotid  Assessment: Status post left  carotid endarterectomy Plan: The patient continues to do very well.  There is no recurrence on her ultrasound.  She will follow-up in a year with an ultrasound and continue with annual surveillance.  The patient asked about coming off of her Plavix.  I had started it in the preoperative period.  I think it is safe for her to be maintained on single agent antiplatelet therapy and I have chosen to keep her on a baby aspirin  V. Charlena CrossWells [name] IV, M.D. [facility] [place] Office: [phone] Pager:  [phone]

## 2015-03-07 DIAGNOSIS — H43812 Vitreous degeneration, left eye: Secondary | ICD-10-CM | POA: Diagnosis not present

## 2015-03-07 DIAGNOSIS — Z7984 Long term (current) use of oral hypoglycemic drugs: Secondary | ICD-10-CM | POA: Diagnosis not present

## 2015-03-07 DIAGNOSIS — H40013 Open angle with borderline findings, low risk, bilateral: Secondary | ICD-10-CM | POA: Diagnosis not present

## 2015-03-07 DIAGNOSIS — H2512 Age-related nuclear cataract, left eye: Secondary | ICD-10-CM | POA: Diagnosis not present

## 2015-03-07 DIAGNOSIS — H59811 Chorioretinal scars after surgery for detachment, right eye: Secondary | ICD-10-CM | POA: Diagnosis not present

## 2015-03-07 DIAGNOSIS — E119 Type 2 diabetes mellitus without complications: Secondary | ICD-10-CM | POA: Diagnosis not present

## 2015-04-12 DIAGNOSIS — R69 Illness, unspecified: Secondary | ICD-10-CM | POA: Diagnosis not present

## 2015-04-29 ENCOUNTER — Ambulatory Visit: Payer: Commercial Managed Care - HMO | Admitting: Family

## 2015-04-29 ENCOUNTER — Encounter (HOSPITAL_COMMUNITY): Payer: Commercial Managed Care - HMO

## 2015-05-10 ENCOUNTER — Emergency Department (HOSPITAL_COMMUNITY)
Admission: EM | Admit: 2015-05-10 | Discharge: 2015-05-10 | Discharge status: Against Medical Advice | Payer: Commercial Managed Care - HMO | Attending: Physician Assistant | Admitting: Physician Assistant

## 2015-05-10 ENCOUNTER — Encounter (HOSPITAL_COMMUNITY): Payer: Self-pay | Admitting: *Deleted

## 2015-05-10 DIAGNOSIS — Z87891 Personal history of nicotine dependence: Secondary | ICD-10-CM | POA: Diagnosis not present

## 2015-05-10 DIAGNOSIS — Z7984 Long term (current) use of oral hypoglycemic drugs: Secondary | ICD-10-CM | POA: Insufficient documentation

## 2015-05-10 DIAGNOSIS — Z8673 Personal history of transient ischemic attack (TIA), and cerebral infarction without residual deficits: Secondary | ICD-10-CM | POA: Insufficient documentation

## 2015-05-10 DIAGNOSIS — M1990 Unspecified osteoarthritis, unspecified site: Secondary | ICD-10-CM | POA: Insufficient documentation

## 2015-05-10 DIAGNOSIS — R21 Rash and other nonspecific skin eruption: Secondary | ICD-10-CM | POA: Insufficient documentation

## 2015-05-10 DIAGNOSIS — Z9861 Coronary angioplasty status: Secondary | ICD-10-CM | POA: Insufficient documentation

## 2015-05-10 DIAGNOSIS — Z79899 Other long term (current) drug therapy: Secondary | ICD-10-CM | POA: Insufficient documentation

## 2015-05-10 DIAGNOSIS — I10 Essential (primary) hypertension: Secondary | ICD-10-CM | POA: Diagnosis not present

## 2015-05-10 DIAGNOSIS — E119 Type 2 diabetes mellitus without complications: Secondary | ICD-10-CM | POA: Diagnosis not present

## 2015-05-10 MED ORDER — DIPHENHYDRAMINE HCL 25 MG PO CAPS
25.0000 mg | ORAL_CAPSULE | Freq: Once | ORAL | Status: AC
  Administered 2015-05-10: 25 mg via ORAL
  Filled 2015-05-10: qty 1

## 2015-05-10 NOTE — ED Provider Notes (Signed)
CSN: [id number]     Arrival date & time 05/10/15  2050 History   First MD Initiated Contact with Patient 05/10/15 2202     Chief Complaint  Patient presents with  . Rash     (Consider location/radiation/quality/duration/timing/severity/associated sxs/prior Treatment) Patient is a 79 y.o. female presenting with rash. The history is provided by the patient.  Rash Location:  Full body Quality: itchiness   Severity:  Severe Onset quality:  Sudden Duration:  8 hours Timing:  Constant Progression:  Worsening Chronicity:  New Context: medications (recently started taking Vitamin D, Garlic, and Multivitamin tablets)   Context: not exposure to similar rash, not new detergent/soap and not sun exposure   Relieved by:  Nothing Worsened by:  Nothing tried Ineffective treatments:  None tried Associated symptoms: no abdominal pain, no diarrhea, no fatigue, no fever, no headaches, no joint pain, no myalgias, no nausea, no shortness of breath, no sore throat, no throat swelling, no tongue swelling, not vomiting and not wheezing     Past Medical History  Diagnosis Date  . TIA (transient ischemic attack)   . Hypertension   . Diabetes mellitus without complication (HCC)     Type 2  . Arthritis   . H/O detached retina repair    Past Surgical History  Procedure Laterality Date  . Eye surgery      lens removed from right eye  . Tonsillectomy    . Endarterectomy Left 01/19/2014    Procedure: ENDARTERECTOMY CAROTID-LEFT;  Surgeon: Nada Libman, MD;  Location: [facility] OR;  Service: Vascular;  Laterality: Left;  . Patch angioplasty Left 01/19/2014    Procedure: PATCH ANGIOPLASTY, LEFT CAROTID ARTERY USING HEMASHIELD PLATINUM FINESSE PATCH;  Surgeon: Nada Libman, MD;  Location: [facility] OR;  Service: Vascular;  Laterality: Left;   Family History  Problem Relation Age of Onset  . Hypertension Mother   . Diabetes Mother   . Hypertension Father    Social History  Substance Use Topics  . Smoking  status: Former Smoker    Quit date: 02/09/1978  . Smokeless tobacco: Never Used  . Alcohol Use: No   OB History    No data available     Review of Systems  Constitutional: Negative for fever, chills, diaphoresis, activity change, appetite change and fatigue.  HENT: Negative for facial swelling, rhinorrhea, sore throat, trouble swallowing and voice change.   Eyes: Negative for photophobia, pain and visual disturbance.  Respiratory: Negative for cough, shortness of breath, wheezing and stridor.   Cardiovascular: Negative for chest pain, palpitations and leg swelling.  Gastrointestinal: Negative for nausea, vomiting, abdominal pain, diarrhea, constipation and anal bleeding.  Endocrine: Negative.   Genitourinary: Negative for dysuria, vaginal bleeding, vaginal discharge and vaginal pain.  Musculoskeletal: Negative for myalgias, back pain and arthralgias.  Skin: Positive for rash.  Allergic/Immunologic: Negative.   Neurological: Negative for dizziness, tremors, syncope, weakness and headaches.  Psychiatric/Behavioral: Negative for suicidal ideas, sleep disturbance and self-injury.  All other systems reviewed and are negative.     Allergies  Aspirin  Home Medications   Prior to Admission medications   Medication Sig Start Date End Date Taking? Authorizing Provider  amLODipine (NORVASC) 10 MG tablet Take 10 mg by mouth daily.   Yes Historical Provider, MD  CASTOR OIL PO Apply 1 application topically daily. Massage into hair   Yes Historical Provider, MD  cholecalciferol (VITAMIN D) 1000 units tablet Take 2,000 Units by mouth daily.   Yes Historical Provider, MD  Garlic 1000  MG CAPS Take 2,000 mg by mouth daily.   Yes Historical Provider, MD  metFORMIN (GLUCOPHAGE) 1000 MG tablet Take 1,000 mg by mouth daily with breakfast.   Yes Historical Provider, MD  Multiple Vitamin (MULTIVITAMIN WITH MINERALS) TABS tablet Take 1 tablet by mouth daily. Centrum   Yes Historical Provider, MD   naproxen sodium (ALEVE) 220 MG tablet Take 440 mg by mouth 2 (two) times daily as needed (pain).   Yes Historical Provider, MD  OVER THE COUNTER MEDICATION Take 2 tablets by mouth daily. VitaFusion for Heart Health gummy - Omega 3 with vitamins A, C, D, E   Yes Historical Provider, MD  valsartan (DIOVAN) 320 MG tablet Take 320 mg by mouth daily.   Yes Historical Provider, MD  aspirin EC 81 MG EC tablet Take 1 tablet (81 mg total) by mouth daily. Patient not taking: Reported on 05/10/2015 01/14/14   Osvaldo ShipperGokul Krishnan, MD  atorvastatin (LIPITOR) 40 MG tablet Take 1 tablet (40 mg total) by mouth daily. Patient not taking: Reported on 05/10/2015 01/14/14   Osvaldo ShipperGokul Krishnan, MD  BAYER CONTOUR NEXT TEST test strip  04/24/14   Historical Provider, MD  clopidogrel (PLAVIX) 75 MG tablet Take 1 tablet (75 mg total) by mouth daily. Patient not taking: Reported on 05/10/2015 02/13/14   Nada LibmanVance W Brabham, MD   BP 113/58 mmHg  Pulse 87  Temp(Src) 97.8 F (36.6 C) (Oral)  Resp 18  SpO2 96% Physical Exam  Constitutional: She is oriented to person, place, and time. She appears well-developed and well-nourished. No distress.  HENT:  Head: Normocephalic and atraumatic.  Right Ear: External ear normal.  Left Ear: External ear normal.  Mouth/Throat: Oropharynx is clear and moist. No oropharyngeal exudate.  Speaking in full sentences, handline oral secretions well. No facial swelling.   Eyes: Conjunctivae and EOM are normal. Pupils are equal, round, and reactive to light. No scleral icterus.  Neck: Normal range of motion. Neck supple. No JVD present. No tracheal deviation present. No thyromegaly present.  Cardiovascular: Normal rate, regular rhythm and intact distal pulses.   Pulmonary/Chest: Effort normal and breath sounds normal. No respiratory distress. She has no wheezes. She has no rales.  Abdominal: Soft. Bowel sounds are normal. She exhibits no distension. There is no tenderness.  Musculoskeletal: Normal range of  motion. She exhibits no edema or tenderness.  Neurological: She is alert and oriented to person, place, and time. No cranial nerve deficit. She exhibits normal muscle tone. Coordination normal.  Skin: Skin is warm and dry. No rash (No rash or erythema evident on my exam. No burrows in interdigit webspaces or elsewhere. No rash aprpeciated.) noted. She is not diaphoretic. No erythema. No pallor.  Psychiatric: She has a normal mood and affect. She expresses no homicidal and no suicidal ideation. She expresses no suicidal plans and no homicidal plans.  Nursing note and vitals reviewed.   ED Course  Procedures (including critical care time) Labs Review Labs Reviewed - No data to display  Imaging Review No results found. I have personally reviewed and evaluated these images and lab results as part of my medical decision-making.   EKG Interpretation None      MDM   Final diagnoses:  Rash    The patient is a 80 year old female who presents for pruritic rash of her entire body which started abruptly at 2 PM today. The patient reports she has recently been taking multiple over-the-counter supplements including garlic, vitamin D, and a multivitamin. She has not been  told that she is deficient in any vitamins prior to starting this regimen. Patient denies any other new medications, denies any new soaps or detergents, denies any exposures to known allergens, denies any one else in her house having similar rashes. Do not suspect anaphylaxis as patient has no second system involved and specifically denies any airway issues. The patient was given Benadryl in triage and by the time she is return to the emergency department the rash is completely resolved. The patient is completely asymptomatic during my exam. I've considered scabies however deem is less likely given the short duration of the rash and the lack of physical exam findings consistent with scabies. Also patient denies any household contacts  with similar symptoms. Do not feel prednisone or other medications indicated given complete resolution of symptoms with 25 g of by mouth Benadryl. Patient is advised to stop new supplements and follow up with her primary care doctor for future instructions on supplement use. She is given strict ED return precautions for any airway symptoms or other signs of possible anaphylaxis. The patient did elope from the emergency department before being evaluated by the attending physician or receiving discharge paperwork.  Patient seen with attending, Dr. Juliann Pares, who oversaw clinical decision making.     Lula Olszewski, MD 05/10/15 [id number]  Abelino Derrick, MD 05/10/15 1191

## 2015-05-10 NOTE — ED Notes (Signed)
Dr. Corlis LeakMackuen went in to room to assess patient but pt had already left.

## 2015-05-10 NOTE — ED Notes (Signed)
The pt has had a rash and itching since 1400 today  .  She has no difficulty breathing she has started a new vitamins  No benadryl taken   She had a less severe episode approx one year ago

## 2015-05-10 NOTE — ED Notes (Signed)
The reaction i getting a little better

## 2015-05-10 NOTE — ED Notes (Signed)
The pts itching is much better sincde she had the benadryl  Her rash has almost gone.  She reports that she feels much better m she no longer has redness over her body as she did earlier

## 2015-05-10 NOTE — ED Notes (Signed)
Dr Hyacinth Meekermiller contacted regarding benadryl.. Orders and observe until she gets a bed

## 2015-06-06 DIAGNOSIS — I10 Essential (primary) hypertension: Secondary | ICD-10-CM | POA: Diagnosis not present

## 2015-06-06 DIAGNOSIS — Z8673 Personal history of transient ischemic attack (TIA), and cerebral infarction without residual deficits: Secondary | ICD-10-CM | POA: Diagnosis not present

## 2015-06-06 DIAGNOSIS — Z7984 Long term (current) use of oral hypoglycemic drugs: Secondary | ICD-10-CM | POA: Diagnosis not present

## 2015-06-06 DIAGNOSIS — E119 Type 2 diabetes mellitus without complications: Secondary | ICD-10-CM | POA: Diagnosis not present

## 2015-06-06 DIAGNOSIS — E7800 Pure hypercholesterolemia, unspecified: Secondary | ICD-10-CM | POA: Diagnosis not present

## 2015-08-12 ENCOUNTER — Encounter: Payer: Self-pay | Admitting: Cardiology

## 2016-01-21 ENCOUNTER — Encounter: Payer: Self-pay | Admitting: Family

## 2016-01-27 ENCOUNTER — Ambulatory Visit: Payer: Commercial Managed Care - HMO | Admitting: Family

## 2016-01-27 ENCOUNTER — Encounter (HOSPITAL_COMMUNITY): Payer: Commercial Managed Care - HMO

## 2016-04-15 DIAGNOSIS — K006 Disturbances in tooth eruption: Secondary | ICD-10-CM | POA: Diagnosis not present

## 2016-04-15 DIAGNOSIS — R69 Illness, unspecified: Secondary | ICD-10-CM | POA: Diagnosis not present

## 2016-04-23 DIAGNOSIS — Z9889 Other specified postprocedural states: Secondary | ICD-10-CM | POA: Diagnosis not present

## 2016-04-23 DIAGNOSIS — Z7984 Long term (current) use of oral hypoglycemic drugs: Secondary | ICD-10-CM | POA: Diagnosis not present

## 2016-04-23 DIAGNOSIS — E7800 Pure hypercholesterolemia, unspecified: Secondary | ICD-10-CM | POA: Diagnosis not present

## 2016-04-23 DIAGNOSIS — I10 Essential (primary) hypertension: Secondary | ICD-10-CM | POA: Diagnosis not present

## 2016-04-23 DIAGNOSIS — Z8679 Personal history of other diseases of the circulatory system: Secondary | ICD-10-CM | POA: Diagnosis not present

## 2016-04-23 DIAGNOSIS — E119 Type 2 diabetes mellitus without complications: Secondary | ICD-10-CM | POA: Diagnosis not present

## 2016-05-20 DIAGNOSIS — R69 Illness, unspecified: Secondary | ICD-10-CM | POA: Diagnosis not present

## 2016-06-23 ENCOUNTER — Encounter: Payer: Self-pay | Admitting: Family

## 2016-06-25 ENCOUNTER — Other Ambulatory Visit: Payer: Self-pay

## 2016-06-25 DIAGNOSIS — I6529 Occlusion and stenosis of unspecified carotid artery: Secondary | ICD-10-CM

## 2016-06-29 ENCOUNTER — Encounter (HOSPITAL_COMMUNITY): Payer: Commercial Managed Care - HMO

## 2016-06-29 ENCOUNTER — Encounter (HOSPITAL_COMMUNITY): Payer: Self-pay

## 2016-06-29 ENCOUNTER — Ambulatory Visit: Payer: Commercial Managed Care - HMO | Admitting: Family

## 2016-06-29 ENCOUNTER — Ambulatory Visit: Payer: Medicare HMO | Admitting: Family

## 2016-07-24 ENCOUNTER — Encounter: Payer: Self-pay | Admitting: Family

## 2016-08-06 ENCOUNTER — Ambulatory Visit: Payer: Medicare HMO | Admitting: Family

## 2016-08-06 ENCOUNTER — Ambulatory Visit (HOSPITAL_COMMUNITY): Payer: Medicare HMO

## 2016-08-31 DIAGNOSIS — E7800 Pure hypercholesterolemia, unspecified: Secondary | ICD-10-CM | POA: Diagnosis not present

## 2016-08-31 DIAGNOSIS — Z79899 Other long term (current) drug therapy: Secondary | ICD-10-CM | POA: Diagnosis not present

## 2016-08-31 DIAGNOSIS — R195 Other fecal abnormalities: Secondary | ICD-10-CM | POA: Diagnosis not present

## 2016-08-31 DIAGNOSIS — E119 Type 2 diabetes mellitus without complications: Secondary | ICD-10-CM | POA: Diagnosis not present

## 2016-08-31 DIAGNOSIS — I10 Essential (primary) hypertension: Secondary | ICD-10-CM | POA: Diagnosis not present

## 2016-08-31 DIAGNOSIS — R634 Abnormal weight loss: Secondary | ICD-10-CM | POA: Diagnosis not present

## 2016-08-31 DIAGNOSIS — E785 Hyperlipidemia, unspecified: Secondary | ICD-10-CM | POA: Diagnosis not present

## 2016-08-31 DIAGNOSIS — Z7984 Long term (current) use of oral hypoglycemic drugs: Secondary | ICD-10-CM | POA: Diagnosis not present

## 2016-08-31 DIAGNOSIS — R194 Change in bowel habit: Secondary | ICD-10-CM | POA: Diagnosis not present

## 2016-09-04 ENCOUNTER — Emergency Department (HOSPITAL_COMMUNITY): Payer: No Typology Code available for payment source

## 2016-09-04 ENCOUNTER — Encounter (HOSPITAL_COMMUNITY): Payer: Self-pay | Admitting: *Deleted

## 2016-09-04 ENCOUNTER — Emergency Department (HOSPITAL_COMMUNITY)
Admission: EM | Admit: 2016-09-04 | Discharge: 2016-09-04 | Disposition: A | Discharge status: Home / Self Care | Payer: No Typology Code available for payment source | Attending: Emergency Medicine | Admitting: Emergency Medicine

## 2016-09-04 DIAGNOSIS — R1011 Right upper quadrant pain: Secondary | ICD-10-CM | POA: Diagnosis not present

## 2016-09-04 DIAGNOSIS — Y9389 Activity, other specified: Secondary | ICD-10-CM | POA: Diagnosis not present

## 2016-09-04 DIAGNOSIS — Z7984 Long term (current) use of oral hypoglycemic drugs: Secondary | ICD-10-CM | POA: Diagnosis not present

## 2016-09-04 DIAGNOSIS — Z79899 Other long term (current) drug therapy: Secondary | ICD-10-CM | POA: Insufficient documentation

## 2016-09-04 DIAGNOSIS — S299XXA Unspecified injury of thorax, initial encounter: Secondary | ICD-10-CM | POA: Diagnosis not present

## 2016-09-04 DIAGNOSIS — R0781 Pleurodynia: Secondary | ICD-10-CM | POA: Diagnosis not present

## 2016-09-04 DIAGNOSIS — Z87891 Personal history of nicotine dependence: Secondary | ICD-10-CM | POA: Diagnosis not present

## 2016-09-04 DIAGNOSIS — R0789 Other chest pain: Secondary | ICD-10-CM | POA: Diagnosis not present

## 2016-09-04 DIAGNOSIS — I10 Essential (primary) hypertension: Secondary | ICD-10-CM | POA: Insufficient documentation

## 2016-09-04 DIAGNOSIS — Y92009 Unspecified place in unspecified non-institutional (private) residence as the place of occurrence of the external cause: Secondary | ICD-10-CM | POA: Insufficient documentation

## 2016-09-04 DIAGNOSIS — Y999 Unspecified external cause status: Secondary | ICD-10-CM | POA: Diagnosis not present

## 2016-09-04 DIAGNOSIS — R109 Unspecified abdominal pain: Secondary | ICD-10-CM | POA: Diagnosis not present

## 2016-09-04 DIAGNOSIS — R079 Chest pain, unspecified: Secondary | ICD-10-CM | POA: Diagnosis present

## 2016-09-04 DIAGNOSIS — E119 Type 2 diabetes mellitus without complications: Secondary | ICD-10-CM | POA: Diagnosis not present

## 2016-09-04 LAB — COMPREHENSIVE METABOLIC PANEL
ALBUMIN: 3.9 g/dL (ref 3.5–5.0)
ALK PHOS: 68 U/L (ref 38–126)
ALT: 13 U/L — AB (ref 14–54)
AST: 25 U/L (ref 15–41)
Anion gap: 8 (ref 5–15)
BUN: 15 mg/dL (ref 6–20)
CALCIUM: 9.4 mg/dL (ref 8.9–10.3)
CHLORIDE: 105 mmol/L (ref 101–111)
CO2: 29 mmol/L (ref 22–32)
CREATININE: 1.01 mg/dL — AB (ref 0.44–1.00)
GFR calc Af Amer: >60 mL/min (ref >60)
GFR calc non Af Amer: 52 mL/min — ABNORMAL LOW (ref >60)
GLUCOSE: 135 mg/dL — AB (ref 65–99)
Potassium: 4.3 mmol/L (ref 3.5–5.1)
SODIUM: 142 mmol/L (ref 135–145)
Total Bilirubin: 0.7 mg/dL (ref 0.3–1.2)
Total Protein: 6.9 g/dL (ref 6.5–8.1)

## 2016-09-04 LAB — CBC
HCT: 43.8 % (ref 36.0–46.0)
HEMOGLOBIN: 14 g/dL (ref 12.0–15.0)
MCH: 27.3 pg (ref 26.0–34.0)
MCHC: 32 g/dL (ref 30.0–36.0)
MCV: 85.5 fL (ref 78.0–100.0)
PLATELETS: 211 10*3/uL (ref 150–400)
RBC: 5.12 MIL/uL — AB (ref 3.87–5.11)
RDW: 14.1 % (ref 11.5–15.5)
WBC: 8.4 10*3/uL (ref 4.0–10.5)

## 2016-09-04 MED ORDER — IOPAMIDOL (ISOVUE-300) INJECTION 61%
INTRAVENOUS | Status: AC
  Administered 2016-09-04: 100 mL
  Filled 2016-09-04: qty 100
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
## 2016-09-04 NOTE — Discharge Instructions (Signed)
You may use Tylenol or ibuprofen as needed for pain. You will likely have some muscle stiffness and soreness over the next several days. Follow-up with primary care in 1 week if your pain is not improving. Return to the emergency department if you develop confusion, slurred speech, difficulty breathing, worsening chest pain, vomiting, numbness, tingling, or any new or worsening symptoms.

## 2016-09-15 DIAGNOSIS — R195 Other fecal abnormalities: Secondary | ICD-10-CM | POA: Diagnosis not present

## 2016-10-07 ENCOUNTER — Encounter: Payer: Self-pay | Admitting: Surgery

## 2016-10-13 ENCOUNTER — Ambulatory Visit (INDEPENDENT_AMBULATORY_CARE_PROVIDER_SITE_OTHER): Payer: Medicare HMO | Admitting: Physician Assistant

## 2016-10-13 ENCOUNTER — Ambulatory Visit (HOSPITAL_COMMUNITY)
Admission: RE | Admit: 2016-10-13 | Discharge: 2016-10-13 | Disposition: A | Discharge status: Home / Self Care | Payer: Medicare HMO | Source: Ambulatory Visit | Attending: Surgery | Admitting: Surgery

## 2016-10-13 VITALS — BP 142/86 | HR 73 | Temp 97.0°F | Resp 14 | Ht 66.0 in | Wt 156.0 lb

## 2016-10-13 DIAGNOSIS — I6523 Occlusion and stenosis of bilateral carotid arteries: Secondary | ICD-10-CM | POA: Diagnosis not present

## 2016-10-13 DIAGNOSIS — I6529 Occlusion and stenosis of unspecified carotid artery: Secondary | ICD-10-CM | POA: Diagnosis not present

## 2016-10-13 DIAGNOSIS — I6522 Occlusion and stenosis of left carotid artery: Secondary | ICD-10-CM | POA: Diagnosis not present

## 2016-10-13 DIAGNOSIS — Z9889 Other specified postprocedural states: Secondary | ICD-10-CM | POA: Insufficient documentation

## 2016-10-13 LAB — VAS US CAROTID
LCCADSYS: -66 cm/s
LEFT ECA DIAS: -2 cm/s
LEFT VERTEBRAL DIAS: 20 cm/s
LICADSYS: -89 cm/s
Left CCA dist dias: -10 cm/s
Left CCA prox dias: 12 cm/s
Left CCA prox sys: 76 cm/s
Left ICA dist dias: -17 cm/s
Left ICA prox dias: -19 cm/s
Left ICA prox sys: -70 cm/s
RCCAPDIAS: 18 cm/s
RIGHT CCA MID DIAS: 22 cm/s
RIGHT ECA DIAS: -9 cm/s
RIGHT VERTEBRAL DIAS: 12 cm/s
Right CCA prox sys: 68 cm/s

## 2016-10-13 NOTE — Progress Notes (Signed)
History of Present Illness:  Patient is a 81 y.o. year old female who presents for evaluation of carotid stenosis s/p left CEA.  The patient denies symptoms of TIA, amaurosis, or stroke. She initially presented to the hospital with a clumsy right hand. She also had garbled speech. She ended up being diagnosed with a left brain infarct. Carotid studies revealed greater than 80% left carotid stenosis. On 01/19/2014, the patient underwent left carotid endarterectomy with bovine pericardial patch angioplasty. The patient is currently on aspirin antiplatelet therapy.   She takes a daily statin, Norvasc for HTN, and  and manages her DM with oral medications.    She reports no medical history or management changes since her last visit 01/21/2015.  Past Medical History:  Diagnosis Date  . Arthritis   . Diabetes mellitus without complication (HCC)    Type 2  . H/O detached retina repair   . Hypertension   . TIA (transient ischemic attack)     Past Surgical History:  Procedure Laterality Date  . ENDARTERECTOMY Left 01/19/2014   Procedure: ENDARTERECTOMY CAROTID-LEFT;  Surgeon: Nada LibmanVance W Brabham, MD;  [facility];  Service: Vascular;  Laterality: Left;  . EYE SURGERY     lens removed from right eye  . PATCH ANGIOPLASTY Left 01/19/2014   Procedure: PATCH ANGIOPLASTY, LEFT CAROTID ARTERY USING HEMASHIELD PLATINUM FINESSE PATCH;  Surgeon: Nada LibmanVance W Brabham, MD;  Location: [facility] OR;  Service: Vascular;  Laterality: Left;  . TONSILLECTOMY       Social History Social History  Substance Use Topics  . Smoking status: Former Smoker    Quit date: 02/09/1978  . Smokeless tobacco: Never Used  . Alcohol use No    Family History Family History  Problem Relation Age of Onset  . Hypertension Mother   . Diabetes Mother   . Hypertension Father     Allergies  Allergies  Allergen Reactions  . Aspirin Nausea Only    Higher dose     Current Outpatient Prescriptions  Medication Sig  Dispense Refill  . amLODipine (NORVASC) 10 MG tablet Take 10 mg by mouth daily.    Marland Kitchen. aspirin EC 81 MG EC tablet Take 1 tablet (81 mg total) by mouth daily. 30 tablet 3  . atorvastatin (LIPITOR) 40 MG tablet Take 1 tablet (40 mg total) by mouth daily. 30 tablet 2  . BAYER CONTOUR NEXT TEST test strip     . cholecalciferol (VITAMIN D) 1000 units tablet Take 1,000 Units by mouth daily.     . metFORMIN (GLUCOPHAGE) 1000 MG tablet Take 1,000 mg by mouth daily with breakfast.    . Multiple Vitamin (MULTIVITAMIN WITH MINERALS) TABS tablet Take 1 tablet by mouth daily. Centrum    . valsartan (DIOVAN) 320 MG tablet Take 320 mg by mouth daily.    . clopidogrel (PLAVIX) 75 MG tablet Take 1 tablet (75 mg total) by mouth daily. (Patient not taking: Reported on 05/10/2015) 30 tablet 10   No current facility-administered medications for this visit.     ROS:   General:  No weight loss, Fever, chills  HEENT: No recent headaches, no nasal bleeding, no visual changes, no sore throat  Neurologic: No dizziness, blackouts, seizures. No recent symptoms of stroke or mini- stroke. No recent episodes of slurred speech, or temporary blindness.  Cardiac: No recent episodes of chest pain/pressure, no shortness of breath at rest.  No shortness of breath with exertion.  Denies history of atrial fibrillation or irregular  heartbeat  Vascular: No history of rest pain in feet.  No history of claudication.  No history of non-healing ulcer, No history of DVT   Pulmonary: No home oxygen, no productive cough, no hemoptysis,  No asthma or wheezing  Musculoskeletal:  [x ] Arthritis, [ ]  Low back pain,  [ ]  Joint pain  Hematologic:No history of hypercoagulable state.  No history of easy bleeding.  No history of anemia  Gastrointestinal: No hematochezia or melena,  No gastroesophageal reflux, no trouble swallowing  Urinary: [ ]  chronic Kidney disease, [ ]  on HD - [ ]  MWF or [ ]  TTHS, [ ]  Burning with urination, [ ]  Frequent  urination, [ ]  Difficulty urinating;   Skin: No rashes  Psychological: No history of anxiety,  No history of depression   Physical Examination  Vitals:   10/13/16 1402 10/13/16 1405 10/13/16 1406  BP: (!) 153/80 (!) 149/92 (!) 142/86  Pulse: 73 73 73  Resp: 14    Temp: (!) 97 F (36.1 C)    SpO2: 98%    Weight: 156 lb (70.8 kg)    Height: 5\' 6"  (1.676 m)      Body mass index is 25.18 kg/m.  General:  Alert and oriented, no acute distress HEENT: Normal Neck: No bruit or JVD Pulmonary: Clear to auscultation bilaterally Cardiac: Regular Rate and Rhythm without murmur Gastrointestinal: Soft, non-tender, non-distended, no mass, no scars Skin: No rash Extremity Pulses:  2+ radial, brachial, femoral, dorsalis pedis, posterior tibial pulses bilaterally Musculoskeletal: No deformity or edema  Neurologic: Upper and lower extremity motor 5/5 and symmetric  DATA:  Carotid duplex Right < 40% stenosis Left patent < 40% stenosis   ASSESSMENT:  Left CVA 2015 Left Carotid stenosis s/p left CEA 2015    PLAN: She is doing well without health changes.  No symptoms of stroke since her last visit in 2016.  We reviewed the symptoms of stroke and she states she understands.  We will schedule her for a repeat carotid duplex in 1 year.   Jule Whitsel MAUREEN PA-C [facility]

## 2016-10-23 DIAGNOSIS — R69 Illness, unspecified: Secondary | ICD-10-CM | POA: Diagnosis not present

## 2017-05-14 DIAGNOSIS — Z0100 Encounter for examination of eyes and vision without abnormal findings: Secondary | ICD-10-CM | POA: Diagnosis not present

## 2017-05-14 DIAGNOSIS — E119 Type 2 diabetes mellitus without complications: Secondary | ICD-10-CM | POA: Diagnosis not present

## 2017-05-17 DIAGNOSIS — Z1211 Encounter for screening for malignant neoplasm of colon: Secondary | ICD-10-CM | POA: Diagnosis not present

## 2017-05-17 DIAGNOSIS — I10 Essential (primary) hypertension: Secondary | ICD-10-CM | POA: Diagnosis not present

## 2017-05-17 DIAGNOSIS — E7800 Pure hypercholesterolemia, unspecified: Secondary | ICD-10-CM | POA: Diagnosis not present

## 2017-05-17 DIAGNOSIS — H6123 Impacted cerumen, bilateral: Secondary | ICD-10-CM | POA: Diagnosis not present

## 2017-05-17 DIAGNOSIS — E119 Type 2 diabetes mellitus without complications: Secondary | ICD-10-CM | POA: Diagnosis not present

## 2017-05-17 DIAGNOSIS — E538 Deficiency of other specified B group vitamins: Secondary | ICD-10-CM | POA: Diagnosis not present

## 2017-05-17 DIAGNOSIS — R140 Abdominal distension (gaseous): Secondary | ICD-10-CM | POA: Diagnosis not present

## 2017-05-17 DIAGNOSIS — R1031 Right lower quadrant pain: Secondary | ICD-10-CM | POA: Diagnosis not present

## 2017-05-17 DIAGNOSIS — Z7984 Long term (current) use of oral hypoglycemic drugs: Secondary | ICD-10-CM | POA: Diagnosis not present

## 2017-06-15 DIAGNOSIS — Z1211 Encounter for screening for malignant neoplasm of colon: Secondary | ICD-10-CM | POA: Diagnosis not present

## 2017-06-15 DIAGNOSIS — R143 Flatulence: Secondary | ICD-10-CM | POA: Diagnosis not present

## 2018-05-06 ENCOUNTER — Encounter (HOSPITAL_COMMUNITY): Payer: Self-pay

## 2018-05-06 ENCOUNTER — Emergency Department (HOSPITAL_COMMUNITY): Payer: Medicare HMO

## 2018-05-06 ENCOUNTER — Other Ambulatory Visit: Payer: Self-pay

## 2018-05-06 ENCOUNTER — Observation Stay (HOSPITAL_COMMUNITY)
Admission: EM | Admit: 2018-05-06 | Discharge: 2018-05-07 | Disposition: A | Discharge status: Home / Self Care | Payer: Medicare HMO | Attending: Internal Medicine | Admitting: Internal Medicine

## 2018-05-06 DIAGNOSIS — IMO0002 Reserved for concepts with insufficient information to code with codable children: Secondary | ICD-10-CM

## 2018-05-06 DIAGNOSIS — N179 Acute kidney failure, unspecified: Secondary | ICD-10-CM

## 2018-05-06 DIAGNOSIS — E119 Type 2 diabetes mellitus without complications: Secondary | ICD-10-CM | POA: Insufficient documentation

## 2018-05-06 DIAGNOSIS — Z7982 Long term (current) use of aspirin: Secondary | ICD-10-CM | POA: Diagnosis not present

## 2018-05-06 DIAGNOSIS — Z9889 Other specified postprocedural states: Secondary | ICD-10-CM | POA: Diagnosis not present

## 2018-05-06 DIAGNOSIS — Z8673 Personal history of transient ischemic attack (TIA), and cerebral infarction without residual deficits: Secondary | ICD-10-CM | POA: Diagnosis not present

## 2018-05-06 DIAGNOSIS — E1165 Type 2 diabetes mellitus with hyperglycemia: Secondary | ICD-10-CM

## 2018-05-06 DIAGNOSIS — R531 Weakness: Secondary | ICD-10-CM | POA: Diagnosis not present

## 2018-05-06 DIAGNOSIS — Z79899 Other long term (current) drug therapy: Secondary | ICD-10-CM | POA: Insufficient documentation

## 2018-05-06 DIAGNOSIS — I10 Essential (primary) hypertension: Secondary | ICD-10-CM | POA: Diagnosis present

## 2018-05-06 DIAGNOSIS — Z7984 Long term (current) use of oral hypoglycemic drugs: Secondary | ICD-10-CM | POA: Insufficient documentation

## 2018-05-06 DIAGNOSIS — I6523 Occlusion and stenosis of bilateral carotid arteries: Secondary | ICD-10-CM | POA: Diagnosis present

## 2018-05-06 DIAGNOSIS — R4781 Slurred speech: Secondary | ICD-10-CM | POA: Diagnosis not present

## 2018-05-06 DIAGNOSIS — I160 Hypertensive urgency: Principal | ICD-10-CM | POA: Insufficient documentation

## 2018-05-06 DIAGNOSIS — Z87891 Personal history of nicotine dependence: Secondary | ICD-10-CM | POA: Insufficient documentation

## 2018-05-06 DIAGNOSIS — E785 Hyperlipidemia, unspecified: Secondary | ICD-10-CM | POA: Diagnosis present

## 2018-05-06 DIAGNOSIS — I739 Peripheral vascular disease, unspecified: Secondary | ICD-10-CM

## 2018-05-06 DIAGNOSIS — E7800 Pure hypercholesterolemia, unspecified: Secondary | ICD-10-CM | POA: Diagnosis not present

## 2018-05-06 DIAGNOSIS — R202 Paresthesia of skin: Secondary | ICD-10-CM | POA: Diagnosis present

## 2018-05-06 DIAGNOSIS — E118 Type 2 diabetes mellitus with unspecified complications: Secondary | ICD-10-CM

## 2018-05-06 DIAGNOSIS — I639 Cerebral infarction, unspecified: Secondary | ICD-10-CM | POA: Diagnosis not present

## 2018-05-06 DIAGNOSIS — I779 Disorder of arteries and arterioles, unspecified: Secondary | ICD-10-CM | POA: Diagnosis present

## 2018-05-06 DIAGNOSIS — N170 Acute kidney failure with tubular necrosis: Secondary | ICD-10-CM

## 2018-05-06 HISTORY — DX: Type 2 diabetes mellitus with hyperglycemia: E11.65

## 2018-05-06 HISTORY — DX: Reserved for concepts with insufficient information to code with codable children: IMO0002

## 2018-05-06 HISTORY — DX: Type 2 diabetes mellitus with unspecified complications: E11.8

## 2018-05-06 HISTORY — DX: Acute kidney failure, unspecified: N17.9

## 2018-05-06 LAB — CBC
HCT: 46.2 % — ABNORMAL HIGH (ref 36.0–46.0)
HEMOGLOBIN: 14.5 g/dL (ref 12.0–15.0)
MCH: 27.1 pg (ref 26.0–34.0)
MCHC: 31.4 g/dL (ref 30.0–36.0)
MCV: 86.4 fL (ref 80.0–100.0)
Platelets: 215 10*3/uL (ref 150–400)
RBC: 5.35 MIL/uL — AB (ref 3.87–5.11)
RDW: 14.2 % (ref 11.5–15.5)
WBC: 6.1 10*3/uL (ref 4.0–10.5)
nRBC: 0 % (ref 0.0–0.2)

## 2018-05-06 LAB — COMPREHENSIVE METABOLIC PANEL
ALT: 8 U/L (ref 0–44)
AST: 22 U/L (ref 15–41)
Albumin: 3.9 g/dL (ref 3.5–5.0)
Alkaline Phosphatase: 64 U/L (ref 38–126)
Anion gap: 10 (ref 5–15)
BUN: 24 mg/dL — ABNORMAL HIGH (ref 8–23)
CHLORIDE: 103 mmol/L (ref 98–111)
CO2: 25 mmol/L (ref 22–32)
CREATININE: 1.54 mg/dL — AB (ref 0.44–1.00)
Calcium: 9 mg/dL (ref 8.9–10.3)
GFR calc non Af Amer: 32 mL/min — ABNORMAL LOW (ref >60)
GFR, EST AFRICAN AMERICAN: 37 mL/min — AB (ref >60)
Glucose, Bld: 132 mg/dL — ABNORMAL HIGH (ref 70–99)
Potassium: 3.6 mmol/L (ref 3.5–5.1)
Sodium: 138 mmol/L (ref 135–145)
Total Bilirubin: 0.7 mg/dL (ref 0.3–1.2)
Total Protein: 7.1 g/dL (ref 6.5–8.1)

## 2018-05-06 LAB — DIFFERENTIAL
ABS IMMATURE GRANULOCYTES: 0.01 10*3/uL (ref 0.00–0.07)
BASOS ABS: 0 10*3/uL (ref 0.0–0.1)
BASOS PCT: 0 %
Eosinophils Absolute: 0.1 10*3/uL (ref 0.0–0.5)
Eosinophils Relative: 1 %
Immature Granulocytes: 0 %
Lymphocytes Relative: 34 %
Lymphs Abs: 2.1 10*3/uL (ref 0.7–4.0)
MONO ABS: 0.9 10*3/uL (ref 0.1–1.0)
Monocytes Relative: 15 %
NEUTROS ABS: 3 10*3/uL (ref 1.7–7.7)
NEUTROS PCT: 50 %

## 2018-05-06 LAB — LIPID PANEL
Cholesterol: 210 mg/dL — ABNORMAL HIGH (ref 0–200)
HDL: 49 mg/dL (ref >40)
LDL Cholesterol: 127 mg/dL — ABNORMAL HIGH (ref 0–99)
Total CHOL/HDL Ratio: 4.3 RATIO
Triglycerides: 171 mg/dL — ABNORMAL HIGH (ref <150)
VLDL: 34 mg/dL (ref 0–40)

## 2018-05-06 LAB — PROTIME-INR
INR: 1 (ref 0.8–1.2)
Prothrombin Time: 13 seconds (ref 11.4–15.2)

## 2018-05-06 LAB — CBG MONITORING, ED: GLUCOSE-CAPILLARY: 124 mg/dL — AB (ref 70–99)

## 2018-05-06 LAB — APTT: APTT: 30 s (ref 24–36)

## 2018-05-06 LAB — I-STAT CREATININE, ED: Creatinine, Ser: 1.6 mg/dL — ABNORMAL HIGH (ref 0.44–1.00)

## 2018-05-06 MED ORDER — ACETAMINOPHEN 325 MG PO TABS: 650.0000 mg | ORAL_TABLET | ORAL | Status: DC | PRN

## 2018-05-06 MED ORDER — INSULIN ASPART 100 UNIT/ML ~~LOC~~ SOLN
0.0000 [IU] | SUBCUTANEOUS | Status: DC
  Administered 2018-05-07: 3 [IU] via SUBCUTANEOUS

## 2018-05-06 MED ORDER — LABETALOL HCL 5 MG/ML IV SOLN
20.0000 mg | Freq: Once | INTRAVENOUS | Status: AC
  Administered 2018-05-06: 20 mg via INTRAVENOUS
  Filled 2018-05-06: qty 4

## 2018-05-06 MED ORDER — STROKE: EARLY STAGES OF RECOVERY BOOK
Freq: Once | Status: AC
  Administered 2018-05-07: 02:00:00

## 2018-05-06 MED ORDER — ENOXAPARIN SODIUM 30 MG/0.3ML ~~LOC~~ SOLN
30.0000 mg | SUBCUTANEOUS | Status: DC
  Administered 2018-05-06: 30 mg via SUBCUTANEOUS
  Filled 2018-05-06 (×2): qty 0.3

## 2018-05-06 MED ORDER — HYDRALAZINE HCL 20 MG/ML IJ SOLN: 5.0000 mg | Freq: Four times a day (QID) | INTRAMUSCULAR | Status: DC | PRN

## 2018-05-06 MED ORDER — ACETAMINOPHEN 160 MG/5ML PO SOLN: 650.0000 mg | ORAL | Status: DC | PRN

## 2018-05-06 MED ORDER — CLEVIDIPINE BUTYRATE 0.5 MG/ML IV EMUL
0.0000 mg/h | INTRAVENOUS | Status: DC
  Filled 2018-05-06 (×3): qty 50

## 2018-05-06 MED ORDER — IOHEXOL 350 MG/ML SOLN: 100.0000 mL | Freq: Once | INTRAVENOUS | Status: DC | PRN

## 2018-05-06 MED ORDER — ASPIRIN EC 81 MG PO TBEC
81.0000 mg | DELAYED_RELEASE_TABLET | Freq: Every day | ORAL | Status: DC
  Administered 2018-05-07: 81 mg via ORAL
  Filled 2018-05-06: qty 1

## 2018-05-06 MED ORDER — ATORVASTATIN CALCIUM 40 MG PO TABS
40.0000 mg | ORAL_TABLET | Freq: Every day | ORAL | Status: DC
  Administered 2018-05-07: 40 mg via ORAL
  Filled 2018-05-06: qty 1

## 2018-05-06 MED ORDER — SODIUM CHLORIDE 0.9 % IV SOLN
INTRAVENOUS | Status: DC
  Administered 2018-05-07: 02:00:00 via INTRAVENOUS

## 2018-05-06 MED ORDER — SODIUM CHLORIDE 0.9% FLUSH
3.0000 mL | Freq: Once | INTRAVENOUS | Status: AC
  Administered 2018-05-06: 3 mL via INTRAVENOUS

## 2018-05-06 MED ORDER — SENNOSIDES-DOCUSATE SODIUM 8.6-50 MG PO TABS
1.0000 | ORAL_TABLET | Freq: Every evening | ORAL | Status: DC | PRN
  Filled 2018-05-06: qty 1

## 2018-05-06 MED ORDER — IOHEXOL 350 MG/ML SOLN: 60.0000 mL | Freq: Once | INTRAVENOUS | Status: DC | PRN

## 2018-05-06 MED ORDER — ACETAMINOPHEN 650 MG RE SUPP: 650.0000 mg | RECTAL | Status: DC | PRN

## 2018-05-06 MED ORDER — IOHEXOL 350 MG/ML SOLN
100.0000 mL | Freq: Once | INTRAVENOUS | Status: AC | PRN
  Administered 2018-05-06: 100 mL via INTRAVENOUS

## 2018-05-06 NOTE — H&P (Signed)
[facility] History and Physical  Kathleen Figueroa BJY:[id number] DOB: 1935-02-22 DOA: 05/06/2018  Referring physician:  PCP: Juluis Rainier, MD   Chief Complaint: TIA vs CVA  HPI: Kathleen Figueroa is a 83 y.o. BF PMHx diabetes type 2 uncontrolled with complication, CVA/TIA, bilateral carotid artery occlusion, s/p carotid endarterectomy HTN, CAD, HLD,  current episode started 6 to 12 hours ago. The problem occurs constantly. The problem has not changed since onset.Nothing aggravates the symptoms. Nothing relieves the symptoms. She has tried nothing for the symptoms. The treatment provided no relief.  Pt reported to have slurred speech 3 hours ago which is improving.  Pt reports she has had numbness in her left arm and hand that started approx 7 hours ago.  Pt reports that has resolved.       Review of Systems:  Constitutional:  No weight loss, night sweats, Fevers, chills, fatigue.  HEENT:  No headaches, Difficulty swallowing,Tooth/dental problems,Sore throat,  No sneezing, itching, ear ache, nasal congestion, post nasal drip,  Cardio-vascular:  No chest pain, Orthopnea, PND, swelling in lower extremities, anasarca, dizziness, palpitations  GI:  No heartburn, indigestion, abdominal pain, nausea, vomiting, diarrhea, change in bowel habits, loss of appetite  Resp:  No shortness of breath with exertion or at rest. No excess mucus, no productive cough, No non-productive cough, No coughing up of blood.No change in color of mucus.No wheezing.No chest wall deformity  Skin:  no rash or lesions.  GU:  no dysuria, change in color of urine, no urgency or frequency. No flank pain.  Musculoskeletal:  No joint pain or swelling. No decreased range of motion. No back pain.  Psych:  No change in mood or affect. No depression or anxiety. No memory loss.   Past Medical History:  Diagnosis Date   Acute renal failure (ARF) (HCC) 05/06/2018   Arthritis    Diabetes mellitus type 2,  uncontrolled, with complications (HCC) 05/06/2018   Diabetes mellitus without complication (HCC)    Type 2   H/O detached retina repair    Hypertension    TIA (transient ischemic attack)    Past Surgical History:  Procedure Laterality Date   ENDARTERECTOMY Left 01/19/2014   Procedure: ENDARTERECTOMY CAROTID-LEFT;  Surgeon: Nada Libman, MD;  Location: [facility] OR;  Service: Vascular;  Laterality: Left;   EYE SURGERY     lens removed from right eye   PATCH ANGIOPLASTY Left 01/19/2014   Procedure: PATCH ANGIOPLASTY, LEFT CAROTID ARTERY USING HEMASHIELD PLATINUM FINESSE PATCH;  Surgeon: Nada Libman, MD;  Location: [facility] OR;  Service: Vascular;  Laterality: Left;   TONSILLECTOMY     Social History:  reports that she quit smoking about 40 years ago. She has never used smokeless tobacco. She reports that she does not drink alcohol or use drugs.  Allergies  Allergen Reactions   Aspirin Nausea Only    Higher dose    Family History  Problem Relation Age of Onset   Hypertension Mother    Diabetes Mother    Hypertension Father       Prior to Admission medications   Medication Sig Start Date End Date Taking? Authorizing Provider  amLODipine (NORVASC) 10 MG tablet Take 10 mg by mouth daily.    [provider]  aspirin EC 81 MG EC tablet Take 1 tablet (81 mg total) by mouth daily. 01/14/14   Osvaldo Shipper, MD  atorvastatin (LIPITOR) 40 MG tablet Take 1 tablet (40 mg total) by mouth daily. 01/14/14   Osvaldo Shipper, MD  BAYER CONTOUR NEXT TEST test strip  04/24/14   [provider]  cholecalciferol (VITAMIN D) 1000 units tablet Take 1,000 Units by mouth daily.     [provider]  clopidogrel (PLAVIX) 75 MG tablet Take 1 tablet (75 mg total) by mouth daily. Patient not taking: Reported on 05/10/2015 02/13/14   Nada Libman, MD  metFORMIN (GLUCOPHAGE) 1000 MG tablet Take 1,000 mg by mouth daily with breakfast.    [provider]  Multiple  Vitamin (MULTIVITAMIN WITH MINERALS) TABS tablet Take 1 tablet by mouth daily. Centrum    [provider]  valsartan (DIOVAN) 320 MG tablet Take 320 mg by mouth daily.    [provider]     Consultants:  [facility] Sheila Oats, MD    Procedures/Significant Events:  3/27 CT angiogram head and neck W/W0 contrast:-No large vessel occlusion. - Intracranial atherosclerosis including severe vertebrobasilar junction, moderate left ICA, moderate to severe right M2, and moderate left P2 stenoses. - patent cervical carotid arteries. -Moderate left vertebral artery origin stenosis. -Negative significant ischemia -Emphysema (ICD10-J43.9). -1.8 cm thyroid nodule.  (Requires further evaluation by ultrasound nonurgently)  3/27 CT head code stroke Wo contrast: Multiple old infarcts, no acute infarct    I have personally reviewed and interpreted all radiology studies and my findings are as above.   VENTILATOR SETTINGS: Negative   Cultures None  Antimicrobials: None   Devices None   LINES / TUBES:  None    Continuous Infusions:  sodium chloride     clevidipine      Physical Exam: Vitals:   05/06/18 2152 05/06/18 2230 05/06/18 2241 05/06/18 2245  BP:  (!) 170/78  (!) 164/78  Pulse: 78 79 78   Resp: (!) 24 16 (!) 23 (!) 21  Temp:      TempSrc:      SpO2: 100% 100% 100%   Weight:      Height:        Wt Readings from Last 3 Encounters:  05/06/18 81.6 kg  10/13/16 70.8 kg  01/21/15 79.5 kg    General: A/O x4, no acute respiratory distress Eyes: negative scleral hemorrhage, negative anisocoria, negative icterus ENT: Negative Runny nose, negative gingival bleeding, Neck:  Negative scars, masses, torticollis, lymphadenopathy, JVD Lungs: Clear to auscultation bilaterally without wheezes or crackles Cardiovascular: Regular rate and rhythm without murmur gallop or rub normal S1 and S2 Abdomen: negative abdominal pain, nondistended, positive  soft, bowel sounds, no rebound, no ascites, no appreciable mass Extremities: No significant cyanosis, clubbing, or edema bilateral lower extremities Skin: Negative rashes, lesions, ulcers Psychiatric:  Negative depression, negative anxiety, negative fatigue, negative mania  Central nervous system:  Cranial nerves II through XII intact, tongue/uvula midline, all extremities muscle strength 5/5, sensation intact throughout, finger nose finger bilateral within normal limits, quick finger touch bilateral within normal limits, negative dysarthria, negative expressive aphasia, negative receptive aphasia.        Labs on Admission:  Basic Metabolic Panel: Recent Labs  Lab 05/06/18 2004 05/06/18 2018  NA 138  --   K 3.6  --   CL 103  --   CO2 25  --   GLUCOSE 132*  --   BUN 24*  --   CREATININE 1.54* 1.60*  CALCIUM 9.0  --    Liver Function Tests: Recent Labs  Lab 05/06/18 2004  AST 22  ALT 8  ALKPHOS 64  BILITOT 0.7  PROT 7.1  ALBUMIN 3.9   No results for input(s): LIPASE,  AMYLASE in the last 168 hours. No results for input(s): AMMONIA in the last 168 hours. CBC: Recent Labs  Lab 05/06/18 2004  WBC 6.1  NEUTROABS 3.0  HGB 14.5  HCT 46.2*  MCV 86.4  PLT 215   Cardiac Enzymes: No results for input(s): CKTOTAL, CKMB, CKMBINDEX, TROPONINI in the last 168 hours.  BNP (last 3 results) No results for input(s): BNP in the last 8760 hours.  ProBNP (last 3 results) No results for input(s): PROBNP in the last 8760 hours.  CBG: Recent Labs  Lab 05/06/18 2016  GLUCAP 124*    Radiological Exams on Admission: Ct Angio Head W Or Wo Contrast  Result Date: 05/06/2018 CLINICAL DATA:  Left arm numbness and slurred speech, now resolved. EXAM: CT ANGIOGRAPHY HEAD AND NECK CT PERFUSION BRAIN TECHNIQUE: Multidetector CT imaging of the head and neck was performed using the standard protocol during bolus administration of intravenous contrast. Multiplanar CT image reconstructions and  MIPs were obtained to evaluate the vascular anatomy. Carotid stenosis measurements (when applicable) are obtained utilizing NASCET criteria, using the distal internal carotid diameter as the denominator. Multiphase CT imaging of the brain was performed following IV bolus contrast injection. Subsequent parametric perfusion maps were calculated using RAPID software. CONTRAST:  OMNIPAQUE IOHEXOL 350 MG/ML SOLN COMPARISON:  Head CT 05/06/2018.  Head MRI/MRA 01/12/2014. FINDINGS: CTA NECK FINDINGS Aortic arch: Standard 3 vessel aortic arch with moderate atherosclerotic plaque. No significant arch vessel origin stenosis. Right carotid system: Patent with mild-to-moderate calcified and soft plaque about the carotid bifurcation. No evidence of significant stenosis or dissection. Tortuous ICA with partial retropharyngeal course. Left carotid system: Patent with evidence of prior endarterectomy. No evidence of significant stenosis or dissection. Tortuous distal cervical ICA. Vertebral arteries: The vertebral arteries are patent and codominant. Plaque at the left vertebral artery origin results in moderate stenosis. Both V2 segments are somewhat tortuous, and there is up to moderate narrowing of the right V2 segment due to cervical spine spurring. 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 Multilevel disc degeneration, severe at C5-6 and C7-T1. Moderate cervical facet arthrosis. Other neck: Heterogeneous thyroid including a 1.8 cm nodule extending inferiorly from the isthmus on the left. Upper chest: Mild centrilobular emphysema. Review of the MIP images confirms the above findings CTA HEAD FINDINGS Anterior circulation: The internal carotid arteries are patent from skull base to carotid termini with atherosclerosis resulting in moderate left cavernous, moderate proximal left supraclinoid, and mild right cavernous stenoses. The left A1 segment is absent. ACAs and MCAs are patent without evidence of proximal branch occlusion.  There is a moderate to severe proximal right M2 inferior division stenosis. No aneurysm is identified. Posterior circulation: The intracranial vertebral arteries are patent with atherosclerotic irregularity most notable at the vertebrobasilar junction where there is severe stenosis. The basilar artery is patent with mild narrowing distally. Patent PICAs and SCAs are seen bilaterally. There are moderately large right and diminutive left posterior communicating arteries. The right P1 segment is hypoplastic. There is a moderate left P2 stenosis. No aneurysm is identified. Venous sinuses: Patent. Anatomic variants: Absent left A1.  Hypoplastic right P1. Delayed phase: Not performed. Review of the MIP images confirms the above findings CT Brain Perfusion Findings: CBF (<30%) Volume: 0mL Perfusion (Tmax>6.0s) volume: 7mL, however this is likely artifactual given bilaterality and location predominantly near brain/bone interfaces Mismatch Volume: n/a given suspected artifact described above Infarction Location:None IMPRESSION: 1. No large vessel occlusion. 2. Intracranial atherosclerosis including severe vertebrobasilar junction, moderate left ICA, moderate to severe  right M2, and moderate left P2 stenoses. 3. Widely patent cervical carotid arteries. 4. Moderate left vertebral artery origin stenosis. 5. No evidence of significant ischemia on CT perfusion. 6. Aortic Atherosclerosis (ICD10-I70.0) and Emphysema (ICD10-J43.9). 7. 1.8 cm thyroid nodule. This could be further evaluated with nonurgent thyroid ultrasound as clinically warranted. Electronically Signed   By: Sebastian Ache M.D.   On: 05/06/2018 22:00   Ct Angio Neck W Or Wo Contrast  Result Date: 05/06/2018 CLINICAL DATA:  Left arm numbness and slurred speech, now resolved. EXAM: CT ANGIOGRAPHY HEAD AND NECK CT PERFUSION BRAIN TECHNIQUE: Multidetector CT imaging of the head and neck was performed using the standard protocol during bolus administration of  intravenous contrast. Multiplanar CT image reconstructions and MIPs were obtained to evaluate the vascular anatomy. Carotid stenosis measurements (when applicable) are obtained utilizing NASCET criteria, using the distal internal carotid diameter as the denominator. Multiphase CT imaging of the brain was performed following IV bolus contrast injection. Subsequent parametric perfusion maps were calculated using RAPID software. CONTRAST:  OMNIPAQUE IOHEXOL 350 MG/ML SOLN COMPARISON:  Head CT 05/06/2018.  Head MRI/MRA 01/12/2014. FINDINGS: CTA NECK FINDINGS Aortic arch: Standard 3 vessel aortic arch with moderate atherosclerotic plaque. No significant arch vessel origin stenosis. Right carotid system: Patent with mild-to-moderate calcified and soft plaque about the carotid bifurcation. No evidence of significant stenosis or dissection. Tortuous ICA with partial retropharyngeal course. Left carotid system: Patent with evidence of prior endarterectomy. No evidence of significant stenosis or dissection. Tortuous distal cervical ICA. Vertebral arteries: The vertebral arteries are patent and codominant. Plaque at the left vertebral artery origin results in moderate stenosis. Both V2 segments are somewhat tortuous, and there is up to moderate narrowing of the right V2 segment due to cervical spine spurring. 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 Multilevel disc degeneration, severe at C5-6 and C7-T1. Moderate cervical facet arthrosis. Other neck: Heterogeneous thyroid including a 1.8 cm nodule extending inferiorly from the isthmus on the left. Upper chest: Mild centrilobular emphysema. Review of the MIP images confirms the above findings CTA HEAD FINDINGS Anterior circulation: The internal carotid arteries are patent from skull base to carotid termini with atherosclerosis resulting in moderate left cavernous, moderate proximal left supraclinoid, and mild right cavernous stenoses. The left A1 segment is absent. ACAs and MCAs  are patent without evidence of proximal branch occlusion. There is a moderate to severe proximal right M2 inferior division stenosis. No aneurysm is identified. Posterior circulation: The intracranial vertebral arteries are patent with atherosclerotic irregularity most notable at the vertebrobasilar junction where there is severe stenosis. The basilar artery is patent with mild narrowing distally. Patent PICAs and SCAs are seen bilaterally. There are moderately large right and diminutive left posterior communicating arteries. The right P1 segment is hypoplastic. There is a moderate left P2 stenosis. No aneurysm is identified. Venous sinuses: Patent. Anatomic variants: Absent left A1.  Hypoplastic right P1. Delayed phase: Not performed. Review of the MIP images confirms the above findings CT Brain Perfusion Findings: CBF (<30%) Volume: 86mL Perfusion (Tmax>6.0s) volume: 11mL, however this is likely artifactual given bilaterality and location predominantly near brain/bone interfaces Mismatch Volume: n/a given suspected artifact described above Infarction Location:None IMPRESSION: 1. No large vessel occlusion. 2. Intracranial atherosclerosis including severe vertebrobasilar junction, moderate left ICA, moderate to severe right M2, and moderate left P2 stenoses. 3. Widely patent cervical carotid arteries. 4. Moderate left vertebral artery origin stenosis. 5. No evidence of significant ischemia on CT perfusion. 6. Aortic Atherosclerosis (ICD10-I70.0) and Emphysema (ICD10-J43.9). 7.  1.8 cm thyroid nodule. This could be further evaluated with nonurgent thyroid ultrasound as clinically warranted. Electronically Signed   By: Sebastian Ache M.D.   On: 05/06/2018 22:00   Ct Cerebral Perfusion W Contrast  Result Date: 05/06/2018 CLINICAL DATA:  Left arm numbness and slurred speech, now resolved. EXAM: CT ANGIOGRAPHY HEAD AND NECK CT PERFUSION BRAIN TECHNIQUE: Multidetector CT imaging of the head and neck was performed using the  standard protocol during bolus administration of intravenous contrast. Multiplanar CT image reconstructions and MIPs were obtained to evaluate the vascular anatomy. Carotid stenosis measurements (when applicable) are obtained utilizing NASCET criteria, using the distal internal carotid diameter as the denominator. Multiphase CT imaging of the brain was performed following IV bolus contrast injection. Subsequent parametric perfusion maps were calculated using RAPID software. CONTRAST:  OMNIPAQUE IOHEXOL 350 MG/ML SOLN COMPARISON:  Head CT 05/06/2018.  Head MRI/MRA 01/12/2014. FINDINGS: CTA NECK FINDINGS Aortic arch: Standard 3 vessel aortic arch with moderate atherosclerotic plaque. No significant arch vessel origin stenosis. Right carotid system: Patent with mild-to-moderate calcified and soft plaque about the carotid bifurcation. No evidence of significant stenosis or dissection. Tortuous ICA with partial retropharyngeal course. Left carotid system: Patent with evidence of prior endarterectomy. No evidence of significant stenosis or dissection. Tortuous distal cervical ICA. Vertebral arteries: The vertebral arteries are patent and codominant. Plaque at the left vertebral artery origin results in moderate stenosis. Both V2 segments are somewhat tortuous, and there is up to moderate narrowing of the right V2 segment due to cervical spine spurring. 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 Multilevel disc degeneration, severe at C5-6 and C7-T1. Moderate cervical facet arthrosis. Other neck: Heterogeneous thyroid including a 1.8 cm nodule extending inferiorly from the isthmus on the left. Upper chest: Mild centrilobular emphysema. Review of the MIP images confirms the above findings CTA HEAD FINDINGS Anterior circulation: The internal carotid arteries are patent from skull base to carotid termini with atherosclerosis resulting in moderate left cavernous, moderate proximal left supraclinoid, and mild right cavernous  stenoses. The left A1 segment is absent. ACAs and MCAs are patent without evidence of proximal branch occlusion. There is a moderate to severe proximal right M2 inferior division stenosis. No aneurysm is identified. Posterior circulation: The intracranial vertebral arteries are patent with atherosclerotic irregularity most notable at the vertebrobasilar junction where there is severe stenosis. The basilar artery is patent with mild narrowing distally. Patent PICAs and SCAs are seen bilaterally. There are moderately large right and diminutive left posterior communicating arteries. The right P1 segment is hypoplastic. There is a moderate left P2 stenosis. No aneurysm is identified. Venous sinuses: Patent. Anatomic variants: Absent left A1.  Hypoplastic right P1. Delayed phase: Not performed. Review of the MIP images confirms the above findings CT Brain Perfusion Findings: CBF (<30%) Volume: 0mL Perfusion (Tmax>6.0s) volume: 7mL, however this is likely artifactual given bilaterality and location predominantly near brain/bone interfaces Mismatch Volume: n/a given suspected artifact described above Infarction Location:None IMPRESSION: 1. No large vessel occlusion. 2. Intracranial atherosclerosis including severe vertebrobasilar junction, moderate left ICA, moderate to severe right M2, and moderate left P2 stenoses. 3. Widely patent cervical carotid arteries. 4. Moderate left vertebral artery origin stenosis. 5. No evidence of significant ischemia on CT perfusion. 6. Aortic Atherosclerosis (ICD10-I70.0) and Emphysema (ICD10-J43.9). 7. 1.8 cm thyroid nodule. This could be further evaluated with nonurgent thyroid ultrasound as clinically warranted. Electronically Signed   By: Sebastian Ache M.D.   On: 05/06/2018 22:00   Ct Head Code Stroke Wo Contrast  Result Date: 05/06/2018 CLINICAL DATA:  Code stroke. LEFT-sided weakness and slurred speech. Symptoms have now resolved. EXAM: CT HEAD WITHOUT CONTRAST TECHNIQUE:  Contiguous axial images were obtained from the base of the skull through the vertex without intravenous contrast. COMPARISON:  CT head and MR head 01/12/2014. FINDINGS: Brain: No acute stroke, acute hemorrhage, mass lesion, hydrocephalus, or extra-axial fluid. Generalized atrophy. Chronic LEFT hemisphere infarcts of varying ages, most recently in 55. BILATERAL basal ganglia chronic lacunar infarcts. LEFT pontine chronic lacunar infarct. Vascular: Calcification of the cavernous internal carotid arteries consistent with cerebrovascular atherosclerotic disease. No signs of intracranial large vessel occlusion. Skull: Calvarium intact. Sinuses/Orbits: No acute finding. Other: None. ASPECTS [facility] Stroke Program Early CT Score) - Ganglionic level infarction (caudate, lentiform nuclei, internal capsule, insula, M1-M3 cortex): 7 - Supraganglionic infarction (M4-M6 cortex): 3 Total score (0-10 with 10 being normal): 10 IMPRESSION: 1. Atrophy and small vessel disease. Multiple old infarcts. No acute intracranial findings. 2. ASPECTS is 10. These results were called by telephone at the time of interpretation on 05/06/2018 at 8:28 pm to Dr. Vanetta Mulders , who verbally acknowledged these results. * Electronically Signed   By: Elsie Stain M.D.   On: 05/06/2018 20:35    EKG: Independently reviewed.  Sinus rhythm  Assessment/Plan Active Problems:   TIA (transient ischemic attack)   Hyperlipidemia   Acute CVA (cerebrovascular accident) (HCC)   Essential hypertension   Carotid artery disease (HCC)   Bilateral carotid artery occlusion   S/P carotid endarterectomy   Diabetes mellitus type 2, uncontrolled, with complications (HCC)   Acute renal failure (ARF) (HCC)   Acute CVA/TIA --Currently patient appears to have returned to baseline (TIA) however high risk given her previous CVA and bilateral carotid artery stenosis. - Transferred to [facility] for completion of stroke work-up - Consult stroke team in the  a.m. upon completion of stroke work-up. - Continue ASA 81 mg  Essential HTN - Allow permissive HTN given her TIA versus CVA - SBP goal-/<180 and DBP-/<90 -Hydralazine 5 mg PRN SBP> 190 or DBP> 100 -Hold all other BP medication to allow permissive HTN  CAD -see HTN  Diabetes type 2 uncontrolled with complication -Hold home DM medication. - Moderate SSI - Hemoglobin A 1C pending  HLD -Lipid panel pending -Continue Lipitor 40 mg daily  Acute renal failure (baseline Cr 1.01, 08/2016) -Monitor closely - Obtain FeNa    Code Status: Full (DVT Prophylaxis: Lovenox Family Communication: None Disposition Plan: TBD   Data Reviewed: Care during the described time interval was provided by me .  I have reviewed this patient's available data, including medical history, events of note, physical examination, and all test results as part of my evaluation.   Time spent: 60 min  Tanyah Debruyne, Roselind Messier [facility] Pager [phone]

## 2018-05-06 NOTE — ED Notes (Signed)
Attempted to call report to nurse at [facility] 3w. Per staff, nurse will call back when available for report.

## 2018-05-06 NOTE — ED Notes (Signed)
Admitting MD at bedside.

## 2018-05-06 NOTE — ED Notes (Signed)
Report given to cindy [facility]

## 2018-05-06 NOTE — Consult Note (Signed)
[facility] TeleNeurology Consult Services   TeleStroke Metrics: LKW: [id number] Door Time: 1949 [facility] Contacted: 2008  [facility] at Bedside: 2013 NIHSS: 2020 Decision on Alteplase: Not a candidate as her last known well time is greater than 4.5 hours prior to her presentation. Interventional Candidate: Not a candidate as her symptoms are not consistent with a large vessel proximal occlusion.   Chief Complaint: Intermittent slurred speech and left hand numbness   HPI: Asked to see this patient in emergent telemedicine consultation utilizing interactive audio and video technologies. ?Consultation was performed with assistance of ancillary / medical staff at bedside.   Verbal consent to perform the examination with telemedicine was obtained. Patient agreed to proceed with the consultation for acute stroke protocol.  83 year old right-handed African-American female who comes to the emergency room due to intermittent slurred speech.  Review of the medical record showed that the patient had multifocal watershed appearing left hemisphere strokes in December 2015.  She was found to have a left ICA 80% stenosis and underwent subsequent left CEA at that time.  Patient reportedly had been out of her blood pressure medications over the last 1 month.  She has not been taking her aspirin either.  Apparently over the last 3 days, she has been having intermittent left hand numbness.  She decided to re-start taking a baby aspirin again over the last 2 days.  Then starting around 2:30 PM, she has been having intermittent episodes of slurred speech lasting for several minutes.  She was talking to her daughter over the phone this afternoon, and she again had slurred speech.  She was encouraged to go to the emergency room.  While in the ER, she was noted to have a blood pressure of 207/91.  Currently, the patient feels fine.   PMH: Hypertension, diabetes mellitus, osteoarthritis, and prior left MCA  strokes in December 2015 due to left ICA 80% stenosis status post left CEA   SOC: Negative x3.  Patient smoked before in the past.  She normally lives alone.   FMH: Significant for hypertension and diabetes mellitus.   ROS: 13 point review systems were reviewed with the patient, and are all negative with the exception of the aforementioned in the history of present illness.   VS: Temperature 98.3 F, pulse 80, respirations 17, blood pressure 207/91, oxygen saturation 100%   Exam: Patient is in no apparent distress.  Patient appears as stated age.  No obvious acute respiratory or cardiac distress.  Patient is well groomed and well-nourished. 1a- LOC: Keenly responsive - 0     1b- LOC questions: Answers both questions correctly - 0    1c- LOC commands- Performs both tasks correctly- 0    2- Gaze: Normal; no gaze paresis or gaze deviation - 0    3- Visual Fields: normal, no Visual field deficit - 0    4- Facial movements: no facial palsy - 0    5- Upper limb motor - no drift - 0    6- Lower limb motor - no drift - 0     7- Limb Coordination: absent ataxia - 0     8- Sensory: no sensory loss - 0     9- Language - No aphasia - 0     10- Speech - No dysarthria -0    11- Neglect / Extinction - none found - 0   NIHSS score: 0    Diagnostic Data: CT of the head showed no acute intracranial hemorrhage, mass, large territory stroke.  There is a prior left parietal MCA stroke.  Medical Data Reviewed:   1.Data?reviewed include clinical labs, radiology,?and medical tests;   2.Tests?results discussed w/performing or interpreting physician;   3.Obtaining/reviewing old medical records;   4.Obtaining?case history from another source;   5.Independent?review of image, tracing, or specimen.    Medical Decision Making:   - Extensive number of diagnosis or management options are considered below.   - Extensive amount of complex data reviewed.   - High risk of complication and/or morbidity or  mortality are associated with differential diagnostic considerations below.   - There may be?uncertain?outcome and increased probability of prolonged functional impairment or high probability of severe prolonged functional impairment associated with some of these differential diagnosis.    Differential Diagnosis for Stroke:   1.?Cardioembolic?stroke   2. Small vessel disease/lacune   3. Thromboembolic, artery-to-artery mechanism   4.?Hypercoagulable?state-related infarct   5. Transient ischemic attack   6. Thrombotic mechanism, large artery disease    Assessment: 1.  Intermittent slurred speech.  Differential diagnosis includes recurrent TIA/stroke versus hypertensive urgency versus recrudescence of prior stroke symptoms 2.  Hypertension 3.  Prior left MCA thromboembolic strokes in December 2015 due to left ICA stenosis status post left CEA 4.  Diabetes mellitus 5.  Osteoarthritis  Recommendations: Patient can be admitted to the hospital for further work-up of her symptoms. Consult local neurology team to assist with evaluation and management. Metabolic and infectious work-up per primary team.  Will need to rule out any underlying subacute infectious process that could be causing recrudescence of prior stroke symptoms. Okay to maintain systolic blood pressure to be less than 180 and diastolic blood pressure less than 90 to cover the possibility of hypertensive urgency. Maintain the patient on a baby aspirin. Check MRI brain without contrast to rule out any acute intracranial process. Check MRA of the head and neck to better evaluate her intracranial and extracranial blood vessels, with particular attention to her left ICA. Check echocardiogram to gauge her cardiac function. Maintain the patient on telemetry to look for paroxysmal atrial fibrillation. Check hemoglobin A1c and lipid panel. Consult PT, OT, and ST. Continue supportive care. Plan of care was discussed with the patient.    Thank you for allowing [facility] to participate in the care of your patient. Please call me, Dr. Adrienne Mocha, with any questions at [phone]. Case discussed with the ER staff and the ER PA.   Critical Care notation:   I was called to see this critical patient emergently. I personally evaluated this critical patient for acute stroke evaluation, and determining their eligibility for IV Alteplase and interventional therapies.  I have spent approximately 11 minutes with the patient, including time at bedside, time discussing the case with other physicians, reviewing plan of care, and time independently reviewing the records and scans.

## 2018-05-06 NOTE — Progress Notes (Signed)
Beeper 2012 Started exam 2012 Finished exam , sent to soc, [facility] called 2014  Left side weakness and slurred speech x3-4 hrs ago.   [facility] Dr. Deretha Emory [phone]

## 2018-05-06 NOTE — ED Triage Notes (Signed)
Pt reports approx 3-4 hours ago she felt she had some slurred speech and some numbness to her left arm (left hand numbness x couple of weeks per patient).  Pt reports her symptoms have resolved now.

## 2018-05-06 NOTE — ED Notes (Signed)
Per K. Sofia, Patient does not meet intervention.

## 2018-05-06 NOTE — ED Provider Notes (Signed)
[facility] EMERGENCY DEPARTMENT Provider Note   CSN: [id number] Arrival date & time: 05/06/18  1949    History   Chief Complaint Chief Complaint  Patient presents with  . Code Stroke    HPI Kathleen Figueroa is a 83 y.o. female.     The history is provided by the patient. No language interpreter was used.  Cerebrovascular Accident  This is a new problem. The current episode started 6 to 12 hours ago. The problem occurs constantly. The problem has not changed since onset.Nothing aggravates the symptoms. Nothing relieves the symptoms. She has tried nothing for the symptoms. The treatment provided no relief.  Pt reported to have slurred speech 3 hours ago which is improving.  Pt reports she has had numbness in her left arm and hand that started approx 7 hours ago.  Pt reports that has resolved.    Past Medical History:  Diagnosis Date  . Arthritis   . Diabetes mellitus without complication (HCC)    Type 2  . H/O detached retina repair   . Hypertension   . TIA (transient ischemic attack)     Patient Active Problem List   Diagnosis Date Noted  . S/P carotid endarterectomy 05/28/2014  . Cerebral infarction due to unspecified mechanism 02/12/2014  . Bilateral carotid artery occlusion 02/12/2014  . Carotid stenosis 01/19/2014  . Carotid artery disease (HCC) 01/14/2014  . Acute CVA (cerebrovascular accident) (HCC) 01/13/2014  . 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 Essential hypertension   . CVA (cerebral infarction) 01/12/2014  . Hyperlipidemia 01/12/2014  . Diabetes mellitus without complication (HCC)   . Hypertension   . TIA (transient ischemic attack)     Past Surgical History:  Procedure Laterality Date  . ENDARTERECTOMY Left 01/19/2014   Procedure: ENDARTERECTOMY CAROTID-LEFT;  Surgeon: Nada Libman, MD;  Location: [facility] OR;  Service: Vascular;  Laterality: Left;  . EYE SURGERY     lens removed from right eye  . PATCH ANGIOPLASTY  Left 01/19/2014   Procedure: PATCH ANGIOPLASTY, LEFT CAROTID ARTERY USING HEMASHIELD PLATINUM FINESSE PATCH;  Surgeon: Nada Libman, MD;  Location: [facility] OR;  Service: Vascular;  Laterality: Left;  . TONSILLECTOMY       OB History   No obstetric history on file.      Home Medications    Prior to Admission medications   Medication Sig Start Date End Date Taking? Authorizing Provider  amLODipine (NORVASC) 10 MG tablet Take 10 mg by mouth daily.    [provider]  aspirin EC 81 MG EC tablet Take 1 tablet (81 mg total) by mouth daily. 01/14/14   Osvaldo Shipper, MD  atorvastatin (LIPITOR) 40 MG tablet Take 1 tablet (40 mg total) by mouth daily. 01/14/14   Osvaldo Shipper, MD  BAYER CONTOUR NEXT TEST test strip  04/24/14   [provider]  cholecalciferol (VITAMIN D) 1000 units tablet Take 1,000 Units by mouth daily.     [provider]  clopidogrel (PLAVIX) 75 MG tablet Take 1 tablet (75 mg total) by mouth daily. Patient not taking: Reported on 05/10/2015 02/13/14   Nada Libman, MD  metFORMIN (GLUCOPHAGE) 1000 MG tablet Take 1,000 mg by mouth daily with breakfast.    [provider]  Multiple Vitamin (MULTIVITAMIN WITH MINERALS) TABS tablet Take 1 tablet by mouth daily. Centrum    [provider]  valsartan (DIOVAN) 320 MG tablet Take 320 mg by mouth daily.    [provider]    Family History Family History  Problem Relation Age of Onset  . Hypertension Mother   . Diabetes Mother   . Hypertension Father     Social History Social History   Tobacco Use  . Smoking status: Former Smoker    Last attempt to quit: 02/09/1978    Years since quitting: 40.2  . Smokeless tobacco: Never Used  Substance Use Topics  . Alcohol use: No    Alcohol/week: 0.0 standard drinks  . Drug use: No     Allergies   Aspirin   Review of Systems Review of Systems  All other systems reviewed and are negative.    Physical Exam Updated Vital  Signs BP (!) 207/91 (BP Location: Left Arm)   Pulse 80   Temp 98.3 F (36.8 C) (Oral)   Resp 17   Ht 5\' 6"  (1.676 m)   Wt 81.6 kg   SpO2 100%   BMI 29.05 kg/m   Physical Exam Vitals signs and nursing note reviewed.  Constitutional:      Appearance: She is well-developed.  HENT:     Head: Normocephalic.     Nose: Nose normal.     Mouth/Throat:     Mouth: Mucous membranes are moist.  Eyes:     Pupils: Pupils are equal, round, and reactive to light.  Neck:     Musculoskeletal: Normal range of motion.  Cardiovascular:     Rate and Rhythm: Normal rate and regular rhythm.     Pulses: Normal pulses.  Pulmonary:     Effort: Pulmonary effort is normal.  Abdominal:     General: Abdomen is flat. There is no distension.  Musculoskeletal: Normal range of motion.  Skin:    General: Skin is warm.  Neurological:     General: No focal deficit present.     Mental Status: She is alert and oriented to person, place, and time.  Psychiatric:        Mood and Affect: Mood normal.      ED Treatments / Results  Labs (all labs ordered are listed, but only abnormal results are displayed) Labs Reviewed  PROTIME-INR  APTT  CBC  DIFFERENTIAL  COMPREHENSIVE METABOLIC PANEL  I-STAT CREATININE, ED  CBG MONITORING, ED    EKG EKG Interpretation  Date/Time:  Friday May 06 2018 19:59:12 EDT Ventricular Rate:  86 PR Interval:    QRS Duration: 80 QT Interval:  337 QTC Calculation: 403 R Axis:   -7 Text Interpretation:  Sinus rhythm Probable left atrial enlargement Low voltage, precordial leads Borderline T wave abnormalities Confirmed by Vanetta Mulders [phone]) on 05/06/2018 8:13:43 PM   Radiology No results found.  Procedures .Critical Care Performed by: Elson Areas, PA-C Authorized by: Elson Areas, PA-C   Critical care provider statement:    Critical care time (minutes):  45   Critical care start time:  05/06/2018 8:25 PM   Critical care end time:  05/06/2018 10:27  PM   Critical care was time spent personally by me on the following activities:  Discussions with consultants, evaluation of patient's response to treatment, examination of patient, ordering and performing treatments and interventions, ordering and review of laboratory studies, ordering and review of radiographic studies, pulse oximetry, re-evaluation of patient's condition, obtaining history from patient or surrogate and review of old charts   (including critical care time)  Medications Ordered in ED Medications  sodium chloride flush (NS) 0.9 % injection 3 mL (has no administration in time  range)     Initial Impression / Assessment and Plan / ED Course  I have reviewed the triage vital signs and the nursing notes.  Pertinent labs & imaging results that were available during my care of the patient were reviewed by me and considered in my medical decision making (see chart for details).        MDM  Tele neurology consult.  Pt not a candidate for intervention. Ct scan no acute.  Neurology advised admission for further evaluation.   I spoke to Dr. Joseph Art Hospitalist who will admit for transfer to [facility]   Final Clinical Impressions(s) / ED Diagnoses   Final diagnoses:  Cerebrovascular accident (CVA), unspecified mechanism [facility])    ED Discharge Orders    None        Osie Cheeks 05/06/18 2228    Vanetta Mulders, MD 05/18/18 [phone]

## 2018-05-07 ENCOUNTER — Inpatient Hospital Stay (HOSPITAL_COMMUNITY): Payer: Medicare HMO

## 2018-05-07 DIAGNOSIS — I10 Essential (primary) hypertension: Secondary | ICD-10-CM | POA: Diagnosis not present

## 2018-05-07 DIAGNOSIS — R202 Paresthesia of skin: Secondary | ICD-10-CM | POA: Diagnosis present

## 2018-05-07 DIAGNOSIS — G311 Senile degeneration of brain, not elsewhere classified: Secondary | ICD-10-CM | POA: Diagnosis not present

## 2018-05-07 DIAGNOSIS — Z79899 Other long term (current) drug therapy: Secondary | ICD-10-CM | POA: Diagnosis not present

## 2018-05-07 DIAGNOSIS — Z87891 Personal history of nicotine dependence: Secondary | ICD-10-CM | POA: Diagnosis not present

## 2018-05-07 DIAGNOSIS — R29818 Other symptoms and signs involving the nervous system: Secondary | ICD-10-CM | POA: Diagnosis not present

## 2018-05-07 DIAGNOSIS — Z8673 Personal history of transient ischemic attack (TIA), and cerebral infarction without residual deficits: Secondary | ICD-10-CM

## 2018-05-07 DIAGNOSIS — I160 Hypertensive urgency: Secondary | ICD-10-CM | POA: Diagnosis not present

## 2018-05-07 DIAGNOSIS — Z7984 Long term (current) use of oral hypoglycemic drugs: Secondary | ICD-10-CM | POA: Diagnosis not present

## 2018-05-07 DIAGNOSIS — E7800 Pure hypercholesterolemia, unspecified: Secondary | ICD-10-CM | POA: Diagnosis not present

## 2018-05-07 DIAGNOSIS — E119 Type 2 diabetes mellitus without complications: Secondary | ICD-10-CM | POA: Diagnosis not present

## 2018-05-07 DIAGNOSIS — Z7982 Long term (current) use of aspirin: Secondary | ICD-10-CM | POA: Diagnosis not present

## 2018-05-07 DIAGNOSIS — I639 Cerebral infarction, unspecified: Secondary | ICD-10-CM | POA: Diagnosis not present

## 2018-05-07 LAB — GLUCOSE, CAPILLARY
GLUCOSE-CAPILLARY: 96 mg/dL (ref 70–99)
Glucose-Capillary: 105 mg/dL — ABNORMAL HIGH (ref 70–99)
Glucose-Capillary: 104 mg/dL — ABNORMAL HIGH (ref 70–99)
Glucose-Capillary: 198 mg/dL — ABNORMAL HIGH (ref 70–99)

## 2018-05-07 LAB — HEMOGLOBIN A1C
Hgb A1c MFr Bld: 6.2 % — ABNORMAL HIGH (ref 4.8–5.6)
Mean Plasma Glucose: 131.24 mg/dL

## 2018-05-07 LAB — ECHOCARDIOGRAM COMPLETE
Height: 65 in
Weight: 2885.38 oz

## 2018-05-07 MED ORDER — VALSARTAN 320 MG PO TABS: 320.0000 mg | ORAL_TABLET | Freq: Every day | ORAL | 0 refills | Status: DC

## 2018-05-07 MED ORDER — AMLODIPINE BESYLATE 10 MG PO TABS: 10.0000 mg | ORAL_TABLET | Freq: Every day | ORAL | 0 refills | Status: DC

## 2018-05-07 MED ORDER — METFORMIN HCL 1000 MG PO TABS: 1000.0000 mg | ORAL_TABLET | Freq: Every day | ORAL | 0 refills | Status: DC

## 2018-05-07 MED ORDER — ATORVASTATIN CALCIUM 80 MG PO TABS: 80.0000 mg | ORAL_TABLET | Freq: Every day | ORAL | 0 refills | Status: DC

## 2018-05-07 MED ORDER — ASPIRIN 81 MG PO TBEC: 81.0000 mg | DELAYED_RELEASE_TABLET | Freq: Every day | ORAL | 0 refills | Status: DC

## 2018-05-07 NOTE — ED Notes (Signed)
[facility] here to transport patient. 

## 2018-05-07 NOTE — Evaluation (Signed)
Physical Therapy Evaluation Patient Details Name: Kathleen Figueroa MRN: [id number] DOB: 04/01/1935 Today's Date: 05/07/2018   History of Present Illness  Pt is an 83 yo female admitted secondary to slurred speech and L arm/hand numbness which has since resolved. MRI of the brain was negative for any acute findings. Pmhx: DMT2, TIA , B/L carotid a stenosis, HTN, CAD, HLD, prior L MCA CVA.    Clinical Impression  Pt seen as a dovetail session with OT. Prior to admission, pt reported that she was independent with all functional mobility and ADLs. Pt stated that she exercises 3-4 times a week at a local gym, performing cardio and strengthening exercises. Pt currently at supervision level to modified independence with all functional mobility. Pt with no LOB or need for physical assistance throughout. Pt participated in a higher level balance assessment and scored a 22/24 on the DGI, indicating that she is not at risk for falls at this time. No further acute PT needs identified at this time. PT signing off.    Follow Up Recommendations No PT follow up    Equipment Recommendations  None recommended by PT    Recommendations for Other Services       Precautions / Restrictions Precautions Precautions: None Restrictions Weight Bearing Restrictions: No      Mobility  Bed Mobility Overal bed mobility: Modified Independent                Transfers Overall transfer level: Needs assistance Equipment used: None Transfers: Sit to/from Stand Sit to Stand: Supervision         General transfer comment: supervision for safety  Ambulation/Gait Ambulation/Gait assistance: Min guard;Supervision Gait Distance (Feet): 300 Feet Assistive device: None Gait Pattern/deviations: Step-through pattern Gait velocity: WFL   General Gait Details: pt with mild instability but no overt LOB or need for physical assistance  Stairs            Wheelchair Mobility    Modified Rankin (Stroke  Patients Only) Modified Rankin (Stroke Patients Only) Pre-Morbid Rankin Score: No symptoms Modified Rankin: No symptoms     Balance Overall balance assessment: Modified Independent                               Standardized Balance Assessment Standardized Balance Assessment : Dynamic Gait Index   Dynamic Gait Index Level Surface: Normal Change in Gait Speed: Normal Gait with Horizontal Head Turns: Normal Gait with Vertical Head Turns: Normal Gait and Pivot Turn: Normal Step Over Obstacle: Mild Impairment Step Around Obstacles: Normal Steps: Mild Impairment Total Score: 22       Pertinent Vitals/Pain Pain Assessment: No/denies pain    Home Living Family/patient expects to be discharged to:: Private residence Living Arrangements: Children;Spouse/significant other Available Help at Discharge: Family;Available PRN/intermittently Type of Home: Apartment Home Access: Level entry     Home Layout: One level Home Equipment: Cane - single point(does not use)      Prior Function Level of Independence: Independent         Comments: Family came to live closer to her from [place] and staying with her for now and plan to move in a month     Hand Dominance   Dominant Hand: Right    Extremity/Trunk Assessment   Upper Extremity Assessment Upper Extremity Assessment: Overall WFL for tasks assessed    Lower Extremity Assessment Lower Extremity Assessment: Overall WFL for tasks assessed    Cervical / Trunk  Assessment Cervical / Trunk Assessment: Other exceptions(slight lean to right )  Communication   Communication: No difficulties  Cognition Arousal/Alertness: Awake/alert Behavior During Therapy: WFL for tasks assessed/performed Overall Cognitive Status: Within Functional Limits for tasks assessed                                 General Comments: Followed all commands with appropriate responses      General Comments General comments (skin  integrity, edema, etc.): No focal deficits noted and pt tolerating session well with coordination and good strength,    Exercises     Assessment/Plan    PT Assessment Patent does not need any further PT services  PT Problem List         PT Treatment Interventions      PT Goals (Current goals can be found in the Care Plan section)  Acute Rehab PT Goals Patient Stated Goal: "go home" PT Goal Formulation: All assessment and education complete, DC therapy    Frequency     Barriers to discharge        Co-evaluation   Reason for Co-Treatment: For patient/therapist safety   OT goals addressed during session: ADL's and self-care       AM-PAC PT "6 Clicks" Mobility  Outcome Measure Help needed turning from your back to your side while in a flat bed without using bedrails?: None Help needed moving from lying on your back to sitting on the side of a flat bed without using bedrails?: None Help needed moving to and from a bed to a chair (including a wheelchair)?: None Help needed standing up from a chair using your arms (e.g., wheelchair or bedside chair)?: None Help needed to walk in hospital room?: None Help needed climbing 3-5 steps with a railing? : None 6 Click Score: 24    End of Session Equipment Utilized During Treatment: Gait belt Activity Tolerance: Patient tolerated treatment well Patient left: in chair;with call bell/phone within reach Nurse Communication: Mobility status PT Visit Diagnosis: Other abnormalities of gait and mobility (R26.89);Other symptoms and signs involving the nervous system (R29.898)    Time: 7628-3151 PT Time Calculation (min) (ACUTE ONLY): 17 min   Charges:   PT Evaluation $PT Eval Moderate Complexity: 1 Mod          Deborah Chalk, PT, DPT  Acute Rehabilitation Services Pager [phone] Office [phone]    Kathleen Figueroa 05/07/2018, 11:11 AM

## 2018-05-07 NOTE — Progress Notes (Signed)
Attempted call to daughter on both mobile and home #'s with no answer and phones seem to be disconnected?

## 2018-05-07 NOTE — Care Management Obs Status (Signed)
MEDICARE OBSERVATION STATUS NOTIFICATION   Patient Details  Name: Kathleen Figueroa MRN: [id number] Date of Birth: Jan 04, 1936   Medicare Observation Status Notification Given:  Yes    Lawerance Sabal, RN 05/07/2018, 1:10 PM

## 2018-05-07 NOTE — Progress Notes (Signed)
SLP Cancellation Note  Patient Details Name: Jorge Lumia MRN: [id number] DOB: June 01, 1935   Cancelled treatment:       Reason Eval/Treat Not Completed: SLP screened, no needs identified, will sign off   Angela Nevin, MA, CCC-SLP Speech Therapy [facility] Acute Rehab Pager: [phone]

## 2018-05-07 NOTE — Progress Notes (Signed)
  Echocardiogram 2D Echocardiogram has been performed.  Celene Skeen 05/07/2018, 9:33 AM

## 2018-05-07 NOTE — Plan of Care (Signed)
Adequate for discharge.

## 2018-05-07 NOTE — Discharge Summary (Signed)
Kathleen Figueroa, is a 83 y.o. female  DOB 19-Apr-1935  MRN [id number].  Admission date:  05/06/2018  Admitting Physician  Drema Dallas, MD  Discharge Date:  05/07/2018   Primary MD  Juluis Rainier, MD  Recommendations for primary care physician for things to follow:  -Please follow CBC, BMP during next visit, to ensure stable renal function, creatinine this admission 1.54, most recent creatinine on record 1.01 in July 2018 -Continue counseling about medication compliance, and adjust antihypertensive regimen as needed -Please obtain thyroid ultrasound as an outpatient for evaluation for 1.8 cm thyroid nodule   Admission Diagnosis   Hypertensive urgency  Discharge Diagnosis Hypertensive urgency   Active Problems:   Hyperlipidemia   Essential hypertension   Carotid artery disease (HCC)   Bilateral carotid artery occlusion   S/P carotid endarterectomy   Diabetes mellitus type 2, uncontrolled, with complications (HCC)   Acute renal failure (ARF) (HCC)      Past Medical History:  Diagnosis Date   Acute renal failure (ARF) (HCC) 05/06/2018   Arthritis    Diabetes mellitus type 2, uncontrolled, with complications (HCC) 05/06/2018   Diabetes mellitus without complication (HCC)    Type 2   H/O detached retina repair    Hypertension    TIA (transient ischemic attack)     Past Surgical History:  Procedure Laterality Date   ENDARTERECTOMY Left 01/19/2014   Procedure: ENDARTERECTOMY CAROTID-LEFT;  Surgeon: Nada Libman, MD;  Location: [facility] OR;  Service: Vascular;  Laterality: Left;   EYE SURGERY     lens removed from right eye   PATCH ANGIOPLASTY Left 01/19/2014   Procedure: PATCH ANGIOPLASTY, LEFT CAROTID ARTERY USING HEMASHIELD PLATINUM FINESSE PATCH;  Surgeon: Nada Libman, MD;  Location: [facility] OR;  Service: Vascular;  Laterality: Left;   TONSILLECTOMY         History of  present illness and  Hospital Course:     Kindly see H&P for history of present illness and admission details, please review complete Labs, Consult reports and Test reports for all details in brief  HPI  from the history and physical done on the day of admission 05/06/2018  HPI: Kathleen Figueroa is a 83 y.o. BF PMHx diabetes type 2 uncontrolled with complication, CVA/TIA, bilateral carotid artery occlusion, s/p carotid endarterectomy HTN, CAD, HLD,  current episode started6 to 12 hours ago. The problem occursconstantly. The problem hasnot changedsince onset.Nothingaggravates the symptoms. Nothingrelieves the symptoms. She has triednothingfor the symptoms. The treatment providednorelief.  Pt reported to have slurred speech 3 hours ago which is improving. Pt reports she has had numbness in her left arm and hand that started approx 7 hours ago. Pt reports that has resolved.    Hospital Course   Focal neurological deficits -Patient presents with intermittent slurred speech for last 3 days, she was admitted for further work-up, MRI of brain with no acute findings, MRA head and neck/CTA head and neck significant for intracranial atherosclerosis, including severe vertebrobasilar junction moderate left ICA, moderate to severe right M2,  and moderate left P2 stenosis, with no large vessel occlusion, 2D echo with no evidence of embolic source, preserved EF, cussed with the neurology, her findings most likely related to hypertensive urgency, apparently patient is noncompliant with her medications, has not been taking her aspirin, statin or antihypertensive regimen, she was seen by PT/OT/SLP, no focal deficits noted. -She was encouraged to resume her aspirin, and Lipitor dose was increased  Hypertensive urgency -Above presentation related to hypertensive urgency, this is secondary to noncompliance with her home regimen, she is resumed on her home education with most recent blood pressure 139/70, it  was uncontrolled secondary to noncompliance with meds  Elevated creatinine with unclear chronicity -The attending was 1.6 on presentation, baseline around 1 in 2018, he was instructed to increase her fluid intake  history of CVA -Report she was instructed to discontinue her Plavix, past, supposed to continue aspirin, which she has not been taking much she was instructed to resume aspirin and statin  Diabetes mellitus -Hemoglobin A1c 6.2, to resume metformin, I have discussed with her, and instructed to resume in 2 days as she received IV contrast  Thyroid nodule -The dental finding of 1.8 cm thyroid nodule, this could be further evaluated with nonemergent thyroid ultrasound as an outpatient  Discharge Condition:  stable   Follow UP  Follow-up Information    Juluis Rainier, MD Follow up in 1 week(s).   Specialty:  Family Medicine Contact information: [address] [place] [address] [phone]             Discharge Instructions  and  Discharge Medications    Discharge Instructions    Discharge instructions   Complete by:  As directed    Follow with Primary MD Juluis Rainier, MD in 7 days   Get CBC, CMP,checked  by Primary MD next visit.    Activity: As tolerated with Full fall precautions use walker/cane & assistance as needed   Disposition Home    Diet: Heart Healthy , carb modified , with feeding assistance and aspiration precautions.  For Heart failure patients - Check your Weight same time everyday, if you gain over 2 pounds, or you develop in leg swelling, experience more shortness of breath or chest pain, call your Primary MD immediately. Follow Cardiac Low Salt Diet and 1.5 lit/day fluid restriction.   On your next visit with your primary care physician please Get Medicines reviewed and adjusted.   Please request your Prim.MD to go over all Hospital Tests and Procedure/Radiological results at the follow up, please get all Hospital  records sent to your Prim MD by signing hospital release before you go home.   If you experience worsening of your admission symptoms, develop shortness of breath, life threatening emergency, suicidal or homicidal thoughts you must seek medical attention immediately by calling 911 or calling your MD immediately  if symptoms less severe.  You Must read complete instructions/literature along with all the possible adverse reactions/side effects for all the Medicines you take and that have been prescribed to you. Take any new Medicines after you have completely understood and accpet all the possible adverse reactions/side effects.   Do not drive, operating heavy machinery, perform activities at heights, swimming or participation in water activities or provide baby sitting services if your were admitted for syncope or siezures until you have seen by Primary MD or a Neurologist and advised to do so again.  Do not drive when taking Pain medications.    Do not take more than  prescribed Pain, Sleep and Anxiety Medications  Special Instructions: If you have smoked or chewed Tobacco  in the last 2 yrs please stop smoking, stop any regular Alcohol  and or any Recreational drug use.  Wear Seat belts while driving.   Please note  You were cared for by a hospitalist during your hospital stay. If you have any questions about your discharge medications or the care you received while you were in the hospital after you are discharged, you can call the unit and asked to speak with the hospitalist on call if the hospitalist that took care of you is not available. Once you are discharged, your primary care physician will handle any further medical issues. Please note that NO REFILLS for any discharge medications will be authorized once you are discharged, as it is imperative that you return to your primary care physician (or establish a relationship with a primary care physician if you do not have one) for your  aftercare needs so that they can reassess your need for medications and monitor your lab values.   Increase activity slowly   Complete by:  As directed      Allergies as of 05/07/2018      Reactions   Aspirin Nausea Only   Higher dose      Medication List    STOP taking these medications   clopidogrel 75 MG tablet Commonly known as:  PLAVIX     TAKE these medications   amLODipine 10 MG tablet Commonly known as:  NORVASC Take 1 tablet (10 mg total) by mouth daily.   aspirin 81 MG EC tablet Take 1 tablet (81 mg total) by mouth daily.   atorvastatin 80 MG tablet Commonly known as:  LIPITOR Take 1 tablet (80 mg total) by mouth daily. What changed:    medication strength  how much to take   Bayer Contour Next Test test strip Generic drug:  glucose blood   cholecalciferol 1000 units tablet Commonly known as:  VITAMIN D Take 1,000 Units by mouth daily.   metFORMIN 1000 MG tablet Commonly known as:  GLUCOPHAGE Take 1 tablet (1,000 mg total) by mouth daily with breakfast. Hold for 2 days as you received IV contrast, and resume on 05/10/2018 Start taking on:  May 10, 2018 What changed:    additional instructions  These instructions start on May 10, 2018. If you are unsure what to do until then, ask your doctor or other care provider.   multivitamin with minerals Tabs tablet Take 1 tablet by mouth daily. Centrum   valsartan 320 MG tablet Commonly known as:  DIOVAN Take 1 tablet (320 mg total) by mouth daily.         Diet and Activity recommendation: See Discharge Instructions above   Consults obtained -  neurology   Major procedures and Radiology Reports - PLEASE review detailed and final reports for all details, in brief -      Ct Angio Head W Or Wo Contrast  Result Date: 05/06/2018 CLINICAL DATA:  Left arm numbness and slurred speech, now resolved. EXAM: CT ANGIOGRAPHY HEAD AND NECK CT PERFUSION BRAIN TECHNIQUE: Multidetector CT imaging of the  head and neck was performed using the standard protocol during bolus administration of intravenous contrast. Multiplanar CT image reconstructions and MIPs were obtained to evaluate the vascular anatomy. Carotid stenosis measurements (when applicable) are obtained utilizing NASCET criteria, using the distal internal carotid diameter as the denominator. Multiphase CT imaging of the brain was performed following IV  bolus contrast injection. Subsequent parametric perfusion maps were calculated using RAPID software. CONTRAST:  OMNIPAQUE IOHEXOL 350 MG/ML SOLN COMPARISON:  Head CT 05/06/2018.  Head MRI/MRA 01/12/2014. FINDINGS: CTA NECK FINDINGS Aortic arch: Standard 3 vessel aortic arch with moderate atherosclerotic plaque. No significant arch vessel origin stenosis. Right carotid system: Patent with mild-to-moderate calcified and soft plaque about the carotid bifurcation. No evidence of significant stenosis or dissection. Tortuous ICA with partial retropharyngeal course. Left carotid system: Patent with evidence of prior endarterectomy. No evidence of significant stenosis or dissection. Tortuous distal cervical ICA. Vertebral arteries: The vertebral arteries are patent and codominant. Plaque at the left vertebral artery origin results in moderate stenosis. Both V2 segments are somewhat tortuous, and there is up to moderate narrowing of the right V2 segment due to cervical spine spurring. 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 Multilevel disc degeneration, severe at C5-6 and C7-T1. Moderate cervical facet arthrosis. Other neck: Heterogeneous thyroid including a 1.8 cm nodule extending inferiorly from the isthmus on the left. Upper chest: Mild centrilobular emphysema. Review of the MIP images confirms the above findings CTA HEAD FINDINGS Anterior circulation: The internal carotid arteries are patent from skull base to carotid termini with atherosclerosis resulting in moderate left cavernous, moderate proximal left  supraclinoid, and mild right cavernous stenoses. The left A1 segment is absent. ACAs and MCAs are patent without evidence of proximal branch occlusion. There is a moderate to severe proximal right M2 inferior division stenosis. No aneurysm is identified. Posterior circulation: The intracranial vertebral arteries are patent with atherosclerotic irregularity most notable at the vertebrobasilar junction where there is severe stenosis. The basilar artery is patent with mild narrowing distally. Patent PICAs and SCAs are seen bilaterally. There are moderately large right and diminutive left posterior communicating arteries. The right P1 segment is hypoplastic. There is a moderate left P2 stenosis. No aneurysm is identified. Venous sinuses: Patent. Anatomic variants: Absent left A1.  Hypoplastic right P1. Delayed phase: Not performed. Review of the MIP images confirms the above findings CT Brain Perfusion Findings: CBF (<30%) Volume: 0mL Perfusion (Tmax>6.0s) volume: 7mL, however this is likely artifactual given bilaterality and location predominantly near brain/bone interfaces Mismatch Volume: n/a given suspected artifact described above Infarction Location:None IMPRESSION: 1. No large vessel occlusion. 2. Intracranial atherosclerosis including severe vertebrobasilar junction, moderate left ICA, moderate to severe right M2, and moderate left P2 stenoses. 3. Widely patent cervical carotid arteries. 4. Moderate left vertebral artery origin stenosis. 5. No evidence of significant ischemia on CT perfusion. 6. Aortic Atherosclerosis (ICD10-I70.0) and Emphysema (ICD10-J43.9). 7. 1.8 cm thyroid nodule. This could be further evaluated with nonurgent thyroid ultrasound as clinically warranted. Electronically Signed   By: Sebastian Ache M.D.   On: 05/06/2018 22:00   Ct Angio Neck W Or Wo Contrast  Result Date: 05/06/2018 CLINICAL DATA:  Left arm numbness and slurred speech, now resolved. EXAM: CT ANGIOGRAPHY HEAD AND NECK CT  PERFUSION BRAIN TECHNIQUE: Multidetector CT imaging of the head and neck was performed using the standard protocol during bolus administration of intravenous contrast. Multiplanar CT image reconstructions and MIPs were obtained to evaluate the vascular anatomy. Carotid stenosis measurements (when applicable) are obtained utilizing NASCET criteria, using the distal internal carotid diameter as the denominator. Multiphase CT imaging of the brain was performed following IV bolus contrast injection. Subsequent parametric perfusion maps were calculated using RAPID software. CONTRAST:  OMNIPAQUE IOHEXOL 350 MG/ML SOLN COMPARISON:  Head CT 05/06/2018.  Head MRI/MRA 01/12/2014. FINDINGS: CTA NECK FINDINGS Aortic arch: Standard 3  vessel aortic arch with moderate atherosclerotic plaque. No significant arch vessel origin stenosis. Right carotid system: Patent with mild-to-moderate calcified and soft plaque about the carotid bifurcation. No evidence of significant stenosis or dissection. Tortuous ICA with partial retropharyngeal course. Left carotid system: Patent with evidence of prior endarterectomy. No evidence of significant stenosis or dissection. Tortuous distal cervical ICA. Vertebral arteries: The vertebral arteries are patent and codominant. Plaque at the left vertebral artery origin results in moderate stenosis. Both V2 segments are somewhat tortuous, and there is up to moderate narrowing of the right V2 segment due to cervical spine spurring. 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 Multilevel disc degeneration, severe at C5-6 and C7-T1. Moderate cervical facet arthrosis. Other neck: Heterogeneous thyroid including a 1.8 cm nodule extending inferiorly from the isthmus on the left. Upper chest: Mild centrilobular emphysema. Review of the MIP images confirms the above findings CTA HEAD FINDINGS Anterior circulation: The internal carotid arteries are patent from skull base to carotid termini with atherosclerosis  resulting in moderate left cavernous, moderate proximal left supraclinoid, and mild right cavernous stenoses. The left A1 segment is absent. ACAs and MCAs are patent without evidence of proximal branch occlusion. There is a moderate to severe proximal right M2 inferior division stenosis. No aneurysm is identified. Posterior circulation: The intracranial vertebral arteries are patent with atherosclerotic irregularity most notable at the vertebrobasilar junction where there is severe stenosis. The basilar artery is patent with mild narrowing distally. Patent PICAs and SCAs are seen bilaterally. There are moderately large right and diminutive left posterior communicating arteries. The right P1 segment is hypoplastic. There is a moderate left P2 stenosis. No aneurysm is identified. Venous sinuses: Patent. Anatomic variants: Absent left A1.  Hypoplastic right P1. Delayed phase: Not performed. Review of the MIP images confirms the above findings CT Brain Perfusion Findings: CBF (<30%) Volume: 0mL Perfusion (Tmax>6.0s) volume: 7mL, however this is likely artifactual given bilaterality and location predominantly near brain/bone interfaces Mismatch Volume: n/a given suspected artifact described above Infarction Location:None IMPRESSION: 1. No large vessel occlusion. 2. Intracranial atherosclerosis including severe vertebrobasilar junction, moderate left ICA, moderate to severe right M2, and moderate left P2 stenoses. 3. Widely patent cervical carotid arteries. 4. Moderate left vertebral artery origin stenosis. 5. No evidence of significant ischemia on CT perfusion. 6. Aortic Atherosclerosis (ICD10-I70.0) and Emphysema (ICD10-J43.9). 7. 1.8 cm thyroid nodule. This could be further evaluated with nonurgent thyroid ultrasound as clinically warranted. Electronically Signed   By: Sebastian Ache M.D.   On: 05/06/2018 22:00   Mr Brain Wo Contrast  Result Date: 05/07/2018 CLINICAL DATA:  Episode of slurred speech with LEFT arm and  hand numbness, now resolved. Stroke risk factors include diabetes mellitus, and hypertension. EXAM: MRI HEAD WITHOUT CONTRAST MRA HEAD WITHOUT CONTRAST TECHNIQUE: Multiplanar, multiecho pulse sequences of the brain and surrounding structures were obtained without intravenous contrast. Angiographic images of the head were obtained using MRA technique without contrast. COMPARISON:  CT head, CTA head and CT perfusion, 05/06/2018. MR head 01/12/2014. FINDINGS: MRI HEAD FINDINGS Brain: No acute infarction, hemorrhage, hydrocephalus, extra-axial collection or mass lesion. Generalized atrophy. T2 and FLAIR hyperintensities of the periventricular and subcortical white matter, both focal and confluent, consistent with moderately advanced chronic microvascular ischemic change. BILATERAL lacunar infarcts. Large area of wedge-shaped brain substance loss, representing chronic LEFT hemisphere infarction, has progressed from 2015. Vascular: Reported separately. Skull and upper cervical spine: Unremarkable skull base. Mild spondylosis. Sinuses/Orbits: Unremarkable. Other: None. MRA HEAD FINDINGS The RIGHT internal carotid artery is widely patent,  and dominant, given the hypoplastic/absent A1 ACA on the LEFT. The LEFT ICA demonstrates moderate cavernous and supraclinoid atheromatous change without a focal flow-limiting stenosis. RIGHT A1 anterior cerebral artery widely patent. Both anterior cerebral arteries fill from the RIGHT. There is mild to moderate irregularity of the LEFT greater than RIGHT A2 and A3 ACA segments, but patency is maintained. RIGHT and LEFT M1 MCA segments widely patent. Moderate to severe narrowing RIGHT M2 MCA inferior division. LEFT M2 MCA segments are patent without proximal stenosis. No M3 occlusion on the RIGHT or LEFT although moderate intracranial atherosclerotic change is observed bilaterally. Vertebrals are codominant. High-grade stenosis of the distal RIGHT vertebral V4 segment at the vertebrobasilar  junction. LEFT vertebral is largely free of disease. The basilar artery is mildly irregular without a focal stenosis. RIGHT P1 segment is hypoplastic, and the RIGHT posterior cerebral artery is largely fat from the ICA has a fetal origin. LEFT P1 PCA is patent, with moderate to severe stenosis in its P2 segment. No visible saccular aneurysm. IMPRESSION: Atrophy and small vessel disease.  No acute stroke. Intracranial atherosclerotic disease as described. No large vessel occlusion. Electronically Signed   By: Elsie Stain M.D.   On: 05/07/2018 07:26   Ct Cerebral Perfusion W Contrast  Result Date: 05/06/2018 CLINICAL DATA:  Left arm numbness and slurred speech, now resolved. EXAM: CT ANGIOGRAPHY HEAD AND NECK CT PERFUSION BRAIN TECHNIQUE: Multidetector CT imaging of the head and neck was performed using the standard protocol during bolus administration of intravenous contrast. Multiplanar CT image reconstructions and MIPs were obtained to evaluate the vascular anatomy. Carotid stenosis measurements (when applicable) are obtained utilizing NASCET criteria, using the distal internal carotid diameter as the denominator. Multiphase CT imaging of the brain was performed following IV bolus contrast injection. Subsequent parametric perfusion maps were calculated using RAPID software. CONTRAST:  OMNIPAQUE IOHEXOL 350 MG/ML SOLN COMPARISON:  Head CT 05/06/2018.  Head MRI/MRA 01/12/2014. FINDINGS: CTA NECK FINDINGS Aortic arch: Standard 3 vessel aortic arch with moderate atherosclerotic plaque. No significant arch vessel origin stenosis. Right carotid system: Patent with mild-to-moderate calcified and soft plaque about the carotid bifurcation. No evidence of significant stenosis or dissection. Tortuous ICA with partial retropharyngeal course. Left carotid system: Patent with evidence of prior endarterectomy. No evidence of significant stenosis or dissection. Tortuous distal cervical ICA. Vertebral arteries: The  vertebral arteries are patent and codominant. Plaque at the left vertebral artery origin results in moderate stenosis. Both V2 segments are somewhat tortuous, and there is up to moderate narrowing of the right V2 segment due to cervical spine spurring. 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 Multilevel disc degeneration, severe at C5-6 and C7-T1. Moderate cervical facet arthrosis. Other neck: Heterogeneous thyroid including a 1.8 cm nodule extending inferiorly from the isthmus on the left. Upper chest: Mild centrilobular emphysema. Review of the MIP images confirms the above findings CTA HEAD FINDINGS Anterior circulation: The internal carotid arteries are patent from skull base to carotid termini with atherosclerosis resulting in moderate left cavernous, moderate proximal left supraclinoid, and mild right cavernous stenoses. The left A1 segment is absent. ACAs and MCAs are patent without evidence of proximal branch occlusion. There is a moderate to severe proximal right M2 inferior division stenosis. No aneurysm is identified. Posterior circulation: The intracranial vertebral arteries are patent with atherosclerotic irregularity most notable at the vertebrobasilar junction where there is severe stenosis. The basilar artery is patent with mild narrowing distally. Patent PICAs and SCAs are seen bilaterally. There are moderately large right  and diminutive left posterior communicating arteries. The right P1 segment is hypoplastic. There is a moderate left P2 stenosis. No aneurysm is identified. Venous sinuses: Patent. Anatomic variants: Absent left A1.  Hypoplastic right P1. Delayed phase: Not performed. Review of the MIP images confirms the above findings CT Brain Perfusion Findings: CBF (<30%) Volume: 81mL Perfusion (Tmax>6.0s) volume: 30mL, however this is likely artifactual given bilaterality and location predominantly near brain/bone interfaces Mismatch Volume: n/a given suspected artifact described above Infarction  Location:None IMPRESSION: 1. No large vessel occlusion. 2. Intracranial atherosclerosis including severe vertebrobasilar junction, moderate left ICA, moderate to severe right M2, and moderate left P2 stenoses. 3. Widely patent cervical carotid arteries. 4. Moderate left vertebral artery origin stenosis. 5. No evidence of significant ischemia on CT perfusion. 6. Aortic Atherosclerosis (ICD10-I70.0) and Emphysema (ICD10-J43.9). 7. 1.8 cm thyroid nodule. This could be further evaluated with nonurgent thyroid ultrasound as clinically warranted. Electronically Signed   By: Sebastian Ache M.D.   On: 05/06/2018 22:00   Mr Maxine Glenn Head/brain JW Cm  Result Date: 05/07/2018 CLINICAL DATA:  Episode of slurred speech with LEFT arm and hand numbness, now resolved. Stroke risk factors include diabetes mellitus, and hypertension. EXAM: MRI HEAD WITHOUT CONTRAST MRA HEAD WITHOUT CONTRAST TECHNIQUE: Multiplanar, multiecho pulse sequences of the brain and surrounding structures were obtained without intravenous contrast. Angiographic images of the head were obtained using MRA technique without contrast. COMPARISON:  CT head, CTA head and CT perfusion, 05/06/2018. MR head 01/12/2014. FINDINGS: MRI HEAD FINDINGS Brain: No acute infarction, hemorrhage, hydrocephalus, extra-axial collection or mass lesion. Generalized atrophy. T2 and FLAIR hyperintensities of the periventricular and subcortical white matter, both focal and confluent, consistent with moderately advanced chronic microvascular ischemic change. BILATERAL lacunar infarcts. Large area of wedge-shaped brain substance loss, representing chronic LEFT hemisphere infarction, has progressed from 2015. Vascular: Reported separately. Skull and upper cervical spine: Unremarkable skull base. Mild spondylosis. Sinuses/Orbits: Unremarkable. Other: None. MRA HEAD FINDINGS The RIGHT internal carotid artery is widely patent, and dominant, given the hypoplastic/absent A1 ACA on the LEFT. The  LEFT ICA demonstrates moderate cavernous and supraclinoid atheromatous change without a focal flow-limiting stenosis. RIGHT A1 anterior cerebral artery widely patent. Both anterior cerebral arteries fill from the RIGHT. There is mild to moderate irregularity of the LEFT greater than RIGHT A2 and A3 ACA segments, but patency is maintained. RIGHT and LEFT M1 MCA segments widely patent. Moderate to severe narrowing RIGHT M2 MCA inferior division. LEFT M2 MCA segments are patent without proximal stenosis. No M3 occlusion on the RIGHT or LEFT although moderate intracranial atherosclerotic change is observed bilaterally. Vertebrals are codominant. High-grade stenosis of the distal RIGHT vertebral V4 segment at the vertebrobasilar junction. LEFT vertebral is largely free of disease. The basilar artery is mildly irregular without a focal stenosis. RIGHT P1 segment is hypoplastic, and the RIGHT posterior cerebral artery is largely fat from the ICA has a fetal origin. LEFT P1 PCA is patent, with moderate to severe stenosis in its P2 segment. No visible saccular aneurysm. IMPRESSION: Atrophy and small vessel disease.  No acute stroke. Intracranial atherosclerotic disease as described. No large vessel occlusion. Electronically Signed   By: Elsie Stain M.D.   On: 05/07/2018 07:26   Ct Head Code Stroke Wo Contrast  Result Date: 05/06/2018 CLINICAL DATA:  Code stroke. LEFT-sided weakness and slurred speech. Symptoms have now resolved. EXAM: CT HEAD WITHOUT CONTRAST TECHNIQUE: Contiguous axial images were obtained from the base of the skull through the vertex without intravenous contrast. COMPARISON:  CT head and MR head 01/12/2014. FINDINGS: Brain: No acute stroke, acute hemorrhage, mass lesion, hydrocephalus, or extra-axial fluid. Generalized atrophy. Chronic LEFT hemisphere infarcts of varying ages, most recently in 38. BILATERAL basal ganglia chronic lacunar infarcts. LEFT pontine chronic lacunar infarct. Vascular:  Calcification of the cavernous internal carotid arteries consistent with cerebrovascular atherosclerotic disease. No signs of intracranial large vessel occlusion. Skull: Calvarium intact. Sinuses/Orbits: No acute finding. Other: None. ASPECTS [facility] Stroke Program Early CT Score) - Ganglionic level infarction (caudate, lentiform nuclei, internal capsule, insula, M1-M3 cortex): 7 - Supraganglionic infarction (M4-M6 cortex): 3 Total score (0-10 with 10 being normal): 10 IMPRESSION: 1. Atrophy and small vessel disease. Multiple old infarcts. No acute intracranial findings. 2. ASPECTS is 10. These results were called by telephone at the time of interpretation on 05/06/2018 at 8:28 pm to Dr. Vanetta Mulders , who verbally acknowledged these results. * Electronically Signed   By: Elsie Stain M.D.   On: 05/06/2018 20:35    Micro Results     No results found for this or any previous visit (from the past 240 hour(s)).     Today   Subjective:   Kathleen Figueroa today has no headache,no chest or abdominal pain, ports slurred speech has resolved, patient reports is feeling better and wants to go home    Objective:   Blood pressure 139/70, pulse 75, temperature 99 F (37.2 C), temperature source Oral, resp. rate 17, height  (1.651 m), weight 81.8 kg, SpO2 98 %.   Intake/Output Summary (Last 24 hours) at 05/07/2018 1314 Last data filed at 05/07/2018 0400 Gross per 24 hour  Intake 164.58 ml  Output --  Net 164.58 ml    Exam Awake Alert, Oriented x 3, No new F.N deficits, Normal affect Symmetrical Chest wall movement, Good air movement bilaterally, CTAB RRR,No Gallops,Rubs or new Murmurs, No Parasternal Heave +ve B.Sounds, Abd Soft, Non tender No Cyanosis, Clubbing or edema, No new Rash or bruise  Data Review   CBC w Diff:  Lab Results  Component Value Date   WBC 6.1 05/06/2018   HGB 14.5 05/06/2018   HCT 46.2 (H) 05/06/2018   PLT 215 05/06/2018   LYMPHOPCT 34 05/06/2018    MONOPCT 15 05/06/2018   EOSPCT 1 05/06/2018   BASOPCT 0 05/06/2018    CMP:  Lab Results  Component Value Date   NA 138 05/06/2018   K 3.6 05/06/2018   CL 103 05/06/2018   CO2 25 05/06/2018   BUN 24 (H) 05/06/2018   CREATININE 1.60 (H) 05/06/2018   PROT 7.1 05/06/2018   ALBUMIN 3.9 05/06/2018   BILITOT 0.7 05/06/2018   ALKPHOS 64 05/06/2018   AST 22 05/06/2018   ALT 8 05/06/2018  .   Total Time in preparing paper work, data evaluation and todays exam - 35 minutes  Huey Bienenstock M.D on 05/07/2018 at 1:14 PM  [facility]   Office  [phone]

## 2018-05-07 NOTE — Consult Note (Signed)
Referring Physician: Dr Randol Kern    Reason for Consult: Stroke  HPI: Kathleen Figueroa is an 83 y.o. female with a history of a TIA/CVA, Htn, Hld, CAD, bilateral carotid artery occlusion with CEA, DM, and ARF who presented to [facility] ER with interrmittent slurred speech and intermittent left hand numbness. The patient had been on ASA and anti-htn meds but ran out about a month ago. She was seen by tele neurology but the pt had presented too late for tPA and her findings were not consistent with LVO. The pt was transferred to [facility] for further evaluation and treatment.  Date last known well: 05/06/2018 Time last known well: 1430  tPA Given: No - Late presentation (>4.5 hours)   Past Medical History Past Medical History:  Diagnosis Date  . Acute renal failure (ARF) (HCC) 05/06/2018  . Arthritis   . Diabetes mellitus type 2, uncontrolled, with complications (HCC) 05/06/2018  . Diabetes mellitus without complication (HCC)    Type 2  . H/O detached retina repair   . Hypertension   . TIA (transient ischemic attack)     Surgical History Past Surgical History:  Procedure Laterality Date  . ENDARTERECTOMY Left 01/19/2014   Procedure: ENDARTERECTOMY CAROTID-LEFT;  Surgeon: Nada Libman, MD;  Location: [facility] OR;  Service: Vascular;  Laterality: Left;  . EYE SURGERY     lens removed from right eye  . PATCH ANGIOPLASTY Left 01/19/2014   Procedure: PATCH ANGIOPLASTY, LEFT CAROTID ARTERY USING HEMASHIELD PLATINUM FINESSE PATCH;  Surgeon: Nada Libman, MD;  Location: [facility] OR;  Service: Vascular;  Laterality: Left;  . TONSILLECTOMY      Family History  Family History  Problem Relation Age of Onset  . Hypertension Mother   . Diabetes Mother   . Hypertension Father     Social History:   reports that she quit smoking about 40 years ago. She has never used smokeless tobacco. She reports that she does not drink alcohol or use drugs.  Allergies:  Allergies  Allergen Reactions  . Aspirin Nausea  Only    Higher dose    Home Medications:  Medications Prior to Admission  Medication Sig Dispense Refill  . amLODipine (NORVASC) 10 MG tablet Take 10 mg by mouth daily.    Marland Kitchen aspirin EC 81 MG EC tablet Take 1 tablet (81 mg total) by mouth daily. 30 tablet 3  . atorvastatin (LIPITOR) 40 MG tablet Take 1 tablet (40 mg total) by mouth daily. 30 tablet 2  . BAYER CONTOUR NEXT TEST test strip     . cholecalciferol (VITAMIN D) 1000 units tablet Take 1,000 Units by mouth daily.     . clopidogrel (PLAVIX) 75 MG tablet Take 1 tablet (75 mg total) by mouth daily. (Patient not taking: Reported on 05/10/2015) 30 tablet 10  . metFORMIN (GLUCOPHAGE) 1000 MG tablet Take 1,000 mg by mouth daily with breakfast.    . Multiple Vitamin (MULTIVITAMIN WITH MINERALS) TABS tablet Take 1 tablet by mouth daily. Centrum    . valsartan (DIOVAN) 320 MG tablet Take 320 mg by mouth daily.      Hospital Medications . aspirin EC  81 mg Oral Daily  . atorvastatin  40 mg Oral Daily  . enoxaparin (LOVENOX) injection  30 mg Subcutaneous Q24H  . insulin aspart  0-15 Units Subcutaneous Q4H    ROS:  History obtained from the patient   General ROS: negative for - chills, fatigue, fever, night sweats, weight gain or weight loss Psychological ROS: negative for -  behavioral disorder, hallucinations, memory difficulties, mood swings or suicidal ideation Ophthalmic ROS: negative for - blurry vision, double vision, eye pain or loss of vision ENT ROS: negative for - epistaxis, nasal discharge, oral lesions, sore throat, tinnitus or vertigo Allergy and Immunology ROS: negative for - hives or itchy/watery eyes Hematological and Lymphatic ROS: negative for - bleeding problems, bruising or swollen lymph nodes Endocrine ROS: negative for - galactorrhea, hair pattern changes, polydipsia/polyuria or temperature intolerance Respiratory ROS: negative for - cough, hemoptysis, shortness of breath or wheezing Cardiovascular ROS: negative for  - chest pain, dyspnea on exertion, edema or irregular heartbeat Gastrointestinal ROS: negative for - abdominal pain, diarrhea, hematemesis, nausea/vomiting or stool incontinence Genito-Urinary ROS: negative for - dysuria, hematuria, incontinence or urinary frequency/urgency Musculoskeletal ROS: negative for - joint swelling or muscular weakness Neurological ROS: as noted in HPI Dermatological ROS: negative for rash and skin lesion changes   Physical Examination:   Temp:  [97.9 F (36.6 C)-99 F (37.2 C)] 99 F (37.2 C) (03/28 1200) Pulse Rate:  [66-85] 75 (03/28 1200) Resp:  [15-24] 17 (03/28 0800) BP: (139-225)/(68-98) 139/70 (03/28 1200) SpO2:  [94 %-100 %] 98 % (03/28 1200) Weight:  [81.6 kg-81.8 kg] 81.8 kg (03/28 0055)  General - Well nourished, well developed, in no apparent distress.  Ophthalmologic - fundi not visualized due to noncooperation.  Cardiovascular - Regular rate and rhythm.  Mental Status -  Level of arousal and orientation to time, place, and person were intact. Language including expression, naming, repetition, comprehension was assessed and found intact.  Cranial Nerves II - XII - II - Visual field intact OU. III, IV, VI - Extraocular movements intact. V - Facial sensation intact bilaterally. VII - Facial movement intact bilaterally. VIII - Hearing & vestibular intact bilaterally. X - Palate elevates symmetrically. XI - Chin turning & shoulder shrug intact bilaterally. XII - Tongue protrusion intact.  Motor Strength - The patient's strength was normal in all extremities and pronator drift was absent.  Bulk was normal and fasciculations were absent.   Motor Tone - Muscle tone was assessed at the neck and appendages and was normal.  Reflexes - The patient's reflexes were symmetrical in all extremities and she had no pathological reflexes.  Sensory - Light touch, temperature/pinprick were assessed and were symmetrical.    Coordination - The patient  had normal movements in the hands with no ataxia or dysmetria.  Tremor was absent.  Gait and Station - deferred.   LABORATORY STUDIES:  Basic Metabolic Panel: Recent Labs  Lab 05/06/18 2004 05/06/18 2018  NA 138  --   K 3.6  --   CL 103  --   CO2 25  --   GLUCOSE 132*  --   BUN 24*  --   CREATININE 1.54* 1.60*  CALCIUM 9.0  --     Liver Function Tests: Recent Labs  Lab 05/06/18 2004  AST 22  ALT 8  ALKPHOS 64  BILITOT 0.7  PROT 7.1  ALBUMIN 3.9   No results for input(s): LIPASE, AMYLASE in the last 168 hours. No results for input(s): AMMONIA in the last 168 hours.  CBC: Recent Labs  Lab 05/06/18 2004  WBC 6.1  NEUTROABS 3.0  HGB 14.5  HCT 46.2*  MCV 86.4  PLT 215    Cardiac Enzymes: No results for input(s): CKTOTAL, CKMB, CKMBINDEX, TROPONINI in the last 168 hours.  BNP: Invalid input(s): POCBNP  CBG: Recent Labs  Lab 05/06/18 2016 05/07/18 0125 05/07/18 0408 05/07/18  1607 05/07/18 1156  GLUCAP 124* 105* 96 104* 198*    Microbiology:   Coagulation Studies: Recent Labs    05/06/18 06/03/02  LABPROT 13.0  INR 1.0    Urinalysis: No results for input(s): COLORURINE, LABSPEC, PHURINE, GLUCOSEU, HGBUR, BILIRUBINUR, KETONESUR, PROTEINUR, UROBILINOGEN, NITRITE, LEUKOCYTESUR in the last 168 hours.  Invalid input(s): APPERANCEUR  Lipid Panel:     Component Value Date/Time   CHOL 210 (H) 05/06/2018 2004   TRIG 171 (H) 05/06/2018 2004   HDL 49 05/06/2018 2004   CHOLHDL 4.3 05/06/2018 2004   VLDL 34 05/06/2018 2004   LDLCALC 127 (H) 05/06/2018 2004    HgbA1C:  Lab Results  Component Value Date   HGBA1C 6.2 (H) 05/06/2018    Urine Drug Screen:      Component Value Date/Time   LABOPIA NONE DETECTED 01/12/2014 1231   COCAINSCRNUR NONE DETECTED 01/12/2014 1231   LABBENZ NONE DETECTED 01/12/2014 1231   AMPHETMU NONE DETECTED 01/12/2014 1231   THCU NONE DETECTED 01/12/2014 1231   LABBARB NONE DETECTED 01/12/2014 1231     Alcohol  Level:  No results for input(s): ETH in the last 168 hours.  Miscellaneous labs:  EKG  EKG - SR rate 86 BPM. (See cardiology reading for complete details)   IMAGING:  Ct Angio Head W Or Wo Contrast Ct Angio Neck W Or Wo Contrast Ct Cerebral Perfusion W Contrast 05/06/2018 IMPRESSION:  1. No large vessel occlusion.  2. Intracranial atherosclerosis including severe vertebrobasilar junction, moderate left ICA, moderate to severe right M2, and moderate left P2 stenoses.  3. Widely patent cervical carotid arteries.  4. Moderate left vertebral artery origin stenosis.  5. No evidence of significant ischemia on CT perfusion.  6. Aortic Atherosclerosis (ICD10-I70.0) and Emphysema (ICD10-J43.9).  7. 1.8 cm thyroid nodule. This could be further evaluated with nonurgent thyroid ultrasound as clinically warranted.   Mr Maxine Glenn Head/brain Wo Cm 05/07/2018 IMPRESSION:  Atrophy and small vessel disease.  No acute stroke. Intracranial atherosclerotic disease as described. No large vessel occlusion.    Ct Head Code Stroke Wo Contrast 05/06/2018 IMPRESSION:  1. Atrophy and small vessel disease. Multiple old infarcts. No acute intracranial findings.  2. ASPECTS is 10.   Transthoracic Echocardiogram 05/07/2018 IMPRESSIONS  1. The left ventricle has normal systolic function with an ejection fraction of 60-65%. The cavity size was normal. There is mild asymmetric left ventricular hypertrophy. Left ventricular diastolic Doppler parameters are consistent with impaired  relaxation.  2. The right ventricle has normal systolic function. The cavity was normal. There is no increase in right ventricular wall thickness.  3. No evidence present in the left atrial appendage.  4. There is mild mitral annular calcification present.  5. The aortic valve is tricuspid. Mild sclerosis of the aortic valve. Aortic valve regurgitation was not assessed by color flow Doppler.  6. No pulmonic valve vegetation  visualized.   ASSESSMENT/PLAN Jennalee Amon is an 83 y.o. female with a history of a TIA/CVA, Htn, Hld, CAD, bilateral carotid artery occlusion with CEA, DM, and ARF who presented to [facility] ER with interrmittent slurred speech and intermittent left hand numbness. The patient had been on ASA and anti-htn meds but ran out about a month ago. She was seen by tele neurology but the pt had presented too late for tPA and her findings were not consistent with LVO. The pt was transferred to [facility] for further evaluation and treatment. tPA not given due to - late presentation (>4.5 hours from onset of symptoms)  Hypertensive encephalopathy   Resultant intermittent slurred speech  CT head - atrophy and small vessel disease. Multiple old infarcts. No acute intracranial findings.   MRI head - no acute stroke  MRA head -  No large vessel occlusion, but multifocal intracranial stenosis.    CTA H&N - severe vertebrobasilar junction, moderate left ICA, moderate to severe right M2, and moderate left P2 stenoses.   2D Echo  - EF 60 - 65%. No cardiac source of emboli identified.   LDL - 127  HgbA1c - 6.2  VTE prophylaxis - Lovenox  Diet - Heart healthy / carb modified with thin liquids.  No antithrombotics prior to admission, now on aspirin 81 mg daily.  Continue aspirin on discharge.  Patient has history of nausea with higher dose of aspirin  Patient counseled to be compliant with her antithrombotic medications  Ongoing aggressive stroke risk factor management  Therapy recommendations:  No F/U therapy recommended  Disposition:  Pending  Diffuse bilateral atherosclerosis  ET and MRI showed multifocal intracranial stenosis including VB junction, left ICA siphon, right M2 and left P2  Not compliant with antithrombotic PTA  Resume aspirin  Continue statin  History of stroke  01/12/2014 left brain multifocal small infarcts.  Carotid Doppler showed left ICA 80 to 99% stenosis.  Discharged on  aspirin and Plavix as well as statin.  Patient had left CEA on 01/19/2014.  Recent carotid Doppler 10/2016 showed bilateral less than 40% stenosis  Hypertensive urgency  High on admission  Stable now . Gradually normalize BP . Long-term BP goal normotensive  Hyperlipidemia  Lipid lowering medication PTA: None, not compliant  LDL 127, goal < 70  Current lipid lowering medication: Lipitor 40 mg daily  Continue statin at discharge  Diabetes  HgbA1c 6.2, goal < 7.0  Controlled  SSI  CBG monitoring  Close PCP follow-up  Other Stroke Risk Factors  Advanced age  Former cigarette smoker - quit 40 years ago  Obesity, Body mass index is 30.01 kg/m., recommend weight loss, diet and exercise as appropriate   Hx stroke/TIA  Coronary artery disease  Previous strokes by imaging  Other Active Problems  Creatinine - 1.54 -> 1.6   1.8 cm thyroid nodule. This could be further evaluated with nonurgent thyroid ultrasound as clinically warranted.   Hx of nausea with high dose aspirin  Neurology will sign off. Please call with questions.  No neuro follow-up needed at this time. thanks for the consult.  Marvel Plan, MD PhD Stroke Neurology 05/07/2018 5:08 PM

## 2018-05-07 NOTE — Progress Notes (Signed)
PT Cancellation Note  Patient Details Name: Kathleen Figueroa MRN: [id number] DOB: [birth year]   Cancelled Treatment:    Reason Eval/Treat Not Completed: Active bedrest order. PT will continue to follow acutely and await updated activity orders.   Alessandra Bevels Blinda Turek 05/07/2018, 8:03 AM

## 2018-05-07 NOTE — Discharge Instructions (Signed)
Follow with Primary MD Juluis Rainier, MD in 7 days   Get CBC, CMP,  checked  by Primary MD next visit.    Activity: As tolerated with Full fall precautions use walker/cane & assistance as needed   Disposition Home    Diet: Heart Healthy , carb modified , with feeding assistance and aspiration precautions.  For Heart failure patients - Check your Weight same time everyday, if you gain over 2 pounds, or you develop in leg swelling, experience more shortness of breath or chest pain, call your Primary MD immediately. Follow Cardiac Low Salt Diet and 1.5 lit/day fluid restriction.   On your next visit with your primary care physician please Get Medicines reviewed and adjusted.   Please request your Prim.MD to go over all Hospital Tests and Procedure/Radiological results at the follow up, please get all Hospital records sent to your Prim MD by signing hospital release before you go home.   If you experience worsening of your admission symptoms, develop shortness of breath, life threatening emergency, suicidal or homicidal thoughts you must seek medical attention immediately by calling 911 or calling your MD immediately  if symptoms less severe.  You Must read complete instructions/literature along with all the possible adverse reactions/side effects for all the Medicines you take and that have been prescribed to you. Take any new Medicines after you have completely understood and accpet all the possible adverse reactions/side effects.   Do not drive, operating heavy machinery, perform activities at heights, swimming or participation in water activities or provide baby sitting services if your were admitted for syncope or siezures until you have seen by Primary MD or a Neurologist and advised to do so again.  Do not drive when taking Pain medications.    Do not take more than prescribed Pain, Sleep and Anxiety Medications  Special Instructions: If you have smoked or chewed Tobacco  in  the last 2 yrs please stop smoking, stop any regular Alcohol  and or any Recreational drug use.  Wear Seat belts while driving.   Please note  You were cared for by a hospitalist during your hospital stay. If you have any questions about your discharge medications or the care you received while you were in the hospital after you are discharged, you can call the unit and asked to speak with the hospitalist on call if the hospitalist that took care of you is not available. Once you are discharged, your primary care physician will handle any further medical issues. Please note that NO REFILLS for any discharge medications will be authorized once you are discharged, as it is imperative that you return to your primary care physician (or establish a relationship with a primary care physician if you do not have one) for your aftercare needs so that they can reassess your need for medications and monitor your lab values.

## 2018-05-07 NOTE — Evaluation (Signed)
Occupational Therapy Evaluation Patient Details Name: Kathleen Figueroa MRN: [id number] DOB: [birth year] Today's Date: 05/07/2018    History of Present Illness Pt is an 83 yo female s/p slurred speech and L arm/hand numbness which has resolved now. MRI shows atrophy and small vessel diseas with no acute events. Pmhx: DMT2, TIA , B/L carotid a stenosis, HTN, CAD, HLD, prior L MCA CVA.   Clinical Impression   Pt PTA: living alone and independent with ADL and mobility; daughter has recently moved in to be able to assist her mother as needed, but plans to move out of her house in a month, but stay close by. Pt currently, with No focal deficits noted and pt tolerating session well with fine and gross motor coordination and good strength. Pt's sensation intact in LUE and  "feels normal." Pt displays fair balance for challenging tasks with no LOB and no AD. Pt at her currentl baseline. Speech sounds normal. No OT follow-up required. OT signing off.    Follow Up Recommendations  No OT follow up;Supervision - Intermittent    Equipment Recommendations  None recommended by OT    Recommendations for Other Services       Precautions / Restrictions Precautions Precautions: Fall Restrictions Weight Bearing Restrictions: No      Mobility Bed Mobility Overal bed mobility: Modified Independent                Transfers Overall transfer level: Needs assistance Equipment used: None Transfers: Sit to/from Stand Sit to Stand: Supervision(for lines.)         General transfer comment: Supervision for safety witth lines    Balance Overall balance assessment: Modified Independent                                         ADL either performed or assessed with clinical judgement   ADL Overall ADL's : At baseline                                       General ADL Comments: Pt performs own dressing using figure 4 technique for LB ADL with no assist in standing  for toilet hygiene nor for grooming at sink. Pt mobiizing well with no AD. fair balance with challenges.     Vision Baseline Vision/History: Wears glasses(for distance)       Perception     Praxis      Pertinent Vitals/Pain Pain Assessment: No/denies pain     Hand Dominance Right   Extremity/Trunk Assessment Upper Extremity Assessment Upper Extremity Assessment: Overall WFL for tasks assessed   Lower Extremity Assessment Lower Extremity Assessment: Overall WFL for tasks assessed;Generalized weakness   Cervical / Trunk Assessment Cervical / Trunk Assessment: Other exceptions(slight lean to right )   Communication Communication Communication: No difficulties   Cognition Arousal/Alertness: Awake/alert Behavior During Therapy: WFL for tasks assessed/performed Overall Cognitive Status: Within Functional Limits for tasks assessed                                 General Comments: Followed all commands with appropriate responses   General Comments  No focal deficits noted and pt tolerating session well with coordination and good strength,    Exercises     Shoulder Instructions  Home Living Family/patient expects to be discharged to:: Private residence Living Arrangements: Children Available Help at Discharge: Family;Available PRN/intermittently Type of Home: Apartment       Home Layout: One level     Bathroom Shower/Tub: Chief Strategy Officer: Standard     Home Equipment: Cane - single point(does not use)          Prior Functioning/Environment Level of Independence: Independent        Comments: Family came to live closer to her from [place] and staying with her for now and plan to move in a month        OT Problem List:        OT Treatment/Interventions:      OT Goals(Current goals can be found in the care plan section)    OT Frequency:     Barriers to D/C:            Co-evaluation PT/OT/SLP  Co-Evaluation/Treatment: Yes Reason for Co-Treatment: For patient/therapist safety   OT goals addressed during session: ADL's and self-care      AM-PAC OT "6 Clicks" Daily Activity     Outcome Measure Help from another person eating meals?: None Help from another person taking care of personal grooming?: None Help from another person toileting, which includes using toliet, bedpan, or urinal?: None Help from another person bathing (including washing, rinsing, drying)?: None Help from another person to put on and taking off regular upper body clothing?: None Help from another person to put on and taking off regular lower body clothing?: None 6 Click Score: 24   End of Session Equipment Utilized During Treatment: Gait belt Nurse Communication: Mobility status  Activity Tolerance: Patient tolerated treatment well Patient left: in chair;with call bell/phone within reach;with chair alarm set  OT Visit Diagnosis: Unsteadiness on feet (R26.81);Muscle weakness (generalized) (M62.81)                Time: 3500-9381 OT Time Calculation (min): 25 min Charges:  OT General Charges $OT Visit: 1 Visit OT Evaluation $OT Eval Moderate Complexity: 1 Mod  Cristi Loron) Glendell Docker OTR/L Acute Rehabilitation Services Pager: [phone] Office: [phone]   Sandrea Hughs 05/07/2018, 10:45 AM

## 2018-05-07 NOTE — Care Management CC44 (Signed)
Condition Code 44 Documentation Completed  Patient Details  Name: Marquisia Fravel MRN: [id number] Date of Birth: 1935-10-13   Condition Code 44 given:  Yes Patient signature on Condition Code 44 notice:  Yes Documentation of 2 MD's agreement:  Yes Code 44 added to claim:  Yes    Lawerance Sabal, RN 05/07/2018, 1:10 PM

## 2018-05-16 DIAGNOSIS — R7989 Other specified abnormal findings of blood chemistry: Secondary | ICD-10-CM | POA: Diagnosis not present

## 2018-05-16 DIAGNOSIS — I10 Essential (primary) hypertension: Secondary | ICD-10-CM | POA: Diagnosis not present

## 2018-05-16 DIAGNOSIS — E041 Nontoxic single thyroid nodule: Secondary | ICD-10-CM | POA: Diagnosis not present

## 2018-05-16 DIAGNOSIS — Z9889 Other specified postprocedural states: Secondary | ICD-10-CM | POA: Diagnosis not present

## 2018-05-16 DIAGNOSIS — E878 Other disorders of electrolyte and fluid balance, not elsewhere classified: Secondary | ICD-10-CM | POA: Diagnosis not present

## 2018-05-16 DIAGNOSIS — I779 Disorder of arteries and arterioles, unspecified: Secondary | ICD-10-CM | POA: Diagnosis not present

## 2018-05-26 ENCOUNTER — Other Ambulatory Visit: Payer: Self-pay | Admitting: Family Medicine

## 2018-05-26 DIAGNOSIS — E041 Nontoxic single thyroid nodule: Secondary | ICD-10-CM

## 2018-07-14 ENCOUNTER — Other Ambulatory Visit: Payer: Medicare HMO

## 2018-10-31 DIAGNOSIS — E538 Deficiency of other specified B group vitamins: Secondary | ICD-10-CM | POA: Diagnosis not present

## 2018-10-31 DIAGNOSIS — Z8673 Personal history of transient ischemic attack (TIA), and cerebral infarction without residual deficits: Secondary | ICD-10-CM | POA: Diagnosis not present

## 2018-10-31 DIAGNOSIS — E119 Type 2 diabetes mellitus without complications: Secondary | ICD-10-CM | POA: Diagnosis not present

## 2018-10-31 DIAGNOSIS — E041 Nontoxic single thyroid nodule: Secondary | ICD-10-CM | POA: Diagnosis not present

## 2018-10-31 DIAGNOSIS — I10 Essential (primary) hypertension: Secondary | ICD-10-CM | POA: Diagnosis not present

## 2018-10-31 DIAGNOSIS — E7800 Pure hypercholesterolemia, unspecified: Secondary | ICD-10-CM | POA: Diagnosis not present

## 2018-10-31 DIAGNOSIS — Z8679 Personal history of other diseases of the circulatory system: Secondary | ICD-10-CM | POA: Diagnosis not present

## 2018-10-31 DIAGNOSIS — I779 Disorder of arteries and arterioles, unspecified: Secondary | ICD-10-CM | POA: Diagnosis not present

## 2018-10-31 DIAGNOSIS — E1169 Type 2 diabetes mellitus with other specified complication: Secondary | ICD-10-CM | POA: Diagnosis not present

## 2018-12-05 ENCOUNTER — Other Ambulatory Visit: Payer: Self-pay

## 2018-12-05 DIAGNOSIS — I6523 Occlusion and stenosis of bilateral carotid arteries: Secondary | ICD-10-CM

## 2018-12-07 ENCOUNTER — Ambulatory Visit: Payer: Medicare HMO | Admitting: Family

## 2018-12-07 ENCOUNTER — Inpatient Hospital Stay (HOSPITAL_COMMUNITY): Admission: RE | Admit: 2018-12-07 | Payer: Medicare HMO | Source: Ambulatory Visit

## 2018-12-09 ENCOUNTER — Telehealth (HOSPITAL_COMMUNITY): Payer: Self-pay

## 2018-12-09 NOTE — Telephone Encounter (Signed)

## 2018-12-12 ENCOUNTER — Ambulatory Visit: Payer: Medicare HMO | Admitting: Family

## 2018-12-12 ENCOUNTER — Inpatient Hospital Stay (HOSPITAL_COMMUNITY): Admission: RE | Admit: 2018-12-12 | Payer: Medicare HMO | Source: Ambulatory Visit

## 2019-01-25 DIAGNOSIS — E7800 Pure hypercholesterolemia, unspecified: Secondary | ICD-10-CM | POA: Diagnosis not present

## 2019-01-25 DIAGNOSIS — Z7984 Long term (current) use of oral hypoglycemic drugs: Secondary | ICD-10-CM | POA: Diagnosis not present

## 2019-01-25 DIAGNOSIS — E119 Type 2 diabetes mellitus without complications: Secondary | ICD-10-CM | POA: Diagnosis not present

## 2019-01-25 DIAGNOSIS — I10 Essential (primary) hypertension: Secondary | ICD-10-CM | POA: Diagnosis not present

## 2019-01-25 DIAGNOSIS — E1169 Type 2 diabetes mellitus with other specified complication: Secondary | ICD-10-CM | POA: Diagnosis not present

## 2019-02-22 DIAGNOSIS — E119 Type 2 diabetes mellitus without complications: Secondary | ICD-10-CM | POA: Diagnosis not present

## 2019-02-22 DIAGNOSIS — I10 Essential (primary) hypertension: Secondary | ICD-10-CM | POA: Diagnosis not present

## 2019-02-22 DIAGNOSIS — E1169 Type 2 diabetes mellitus with other specified complication: Secondary | ICD-10-CM | POA: Diagnosis not present

## 2019-02-22 DIAGNOSIS — E7800 Pure hypercholesterolemia, unspecified: Secondary | ICD-10-CM | POA: Diagnosis not present

## 2019-03-23 DIAGNOSIS — E119 Type 2 diabetes mellitus without complications: Secondary | ICD-10-CM | POA: Diagnosis not present

## 2019-03-23 DIAGNOSIS — E7800 Pure hypercholesterolemia, unspecified: Secondary | ICD-10-CM | POA: Diagnosis not present

## 2019-03-23 DIAGNOSIS — I10 Essential (primary) hypertension: Secondary | ICD-10-CM | POA: Diagnosis not present

## 2019-03-23 DIAGNOSIS — E1169 Type 2 diabetes mellitus with other specified complication: Secondary | ICD-10-CM | POA: Diagnosis not present

## 2019-04-27 DIAGNOSIS — E119 Type 2 diabetes mellitus without complications: Secondary | ICD-10-CM | POA: Diagnosis not present

## 2019-04-27 DIAGNOSIS — E1169 Type 2 diabetes mellitus with other specified complication: Secondary | ICD-10-CM | POA: Diagnosis not present

## 2019-04-27 DIAGNOSIS — E7800 Pure hypercholesterolemia, unspecified: Secondary | ICD-10-CM | POA: Diagnosis not present

## 2019-04-27 DIAGNOSIS — I10 Essential (primary) hypertension: Secondary | ICD-10-CM | POA: Diagnosis not present

## 2019-05-17 DIAGNOSIS — E119 Type 2 diabetes mellitus without complications: Secondary | ICD-10-CM | POA: Diagnosis not present

## 2019-05-25 DIAGNOSIS — E7800 Pure hypercholesterolemia, unspecified: Secondary | ICD-10-CM | POA: Diagnosis not present

## 2019-05-25 DIAGNOSIS — E119 Type 2 diabetes mellitus without complications: Secondary | ICD-10-CM | POA: Diagnosis not present

## 2019-05-25 DIAGNOSIS — E1169 Type 2 diabetes mellitus with other specified complication: Secondary | ICD-10-CM | POA: Diagnosis not present

## 2019-05-25 DIAGNOSIS — I10 Essential (primary) hypertension: Secondary | ICD-10-CM | POA: Diagnosis not present

## 2019-06-20 DIAGNOSIS — H35341 Macular cyst, hole, or pseudohole, right eye: Secondary | ICD-10-CM | POA: Diagnosis not present

## 2019-06-20 DIAGNOSIS — I10 Essential (primary) hypertension: Secondary | ICD-10-CM | POA: Diagnosis not present

## 2019-06-20 DIAGNOSIS — H2512 Age-related nuclear cataract, left eye: Secondary | ICD-10-CM | POA: Diagnosis not present

## 2019-06-20 DIAGNOSIS — H25042 Posterior subcapsular polar age-related cataract, left eye: Secondary | ICD-10-CM | POA: Diagnosis not present

## 2019-06-20 DIAGNOSIS — H18413 Arcus senilis, bilateral: Secondary | ICD-10-CM | POA: Diagnosis not present

## 2019-06-20 DIAGNOSIS — E7800 Pure hypercholesterolemia, unspecified: Secondary | ICD-10-CM | POA: Diagnosis not present

## 2019-06-20 DIAGNOSIS — E119 Type 2 diabetes mellitus without complications: Secondary | ICD-10-CM | POA: Diagnosis not present

## 2019-06-20 DIAGNOSIS — H25012 Cortical age-related cataract, left eye: Secondary | ICD-10-CM | POA: Diagnosis not present

## 2019-06-20 DIAGNOSIS — H02834 Dermatochalasis of left upper eyelid: Secondary | ICD-10-CM | POA: Diagnosis not present

## 2019-06-20 DIAGNOSIS — E1169 Type 2 diabetes mellitus with other specified complication: Secondary | ICD-10-CM | POA: Diagnosis not present

## 2019-07-03 DIAGNOSIS — H2512 Age-related nuclear cataract, left eye: Secondary | ICD-10-CM | POA: Diagnosis not present

## 2019-07-12 DIAGNOSIS — I10 Essential (primary) hypertension: Secondary | ICD-10-CM | POA: Diagnosis not present

## 2019-07-12 DIAGNOSIS — E7800 Pure hypercholesterolemia, unspecified: Secondary | ICD-10-CM | POA: Diagnosis not present

## 2019-07-12 DIAGNOSIS — E119 Type 2 diabetes mellitus without complications: Secondary | ICD-10-CM | POA: Diagnosis not present

## 2019-07-12 DIAGNOSIS — E1169 Type 2 diabetes mellitus with other specified complication: Secondary | ICD-10-CM | POA: Diagnosis not present

## 2019-08-17 DIAGNOSIS — E119 Type 2 diabetes mellitus without complications: Secondary | ICD-10-CM | POA: Diagnosis not present

## 2019-08-17 DIAGNOSIS — E7800 Pure hypercholesterolemia, unspecified: Secondary | ICD-10-CM | POA: Diagnosis not present

## 2019-08-17 DIAGNOSIS — E1169 Type 2 diabetes mellitus with other specified complication: Secondary | ICD-10-CM | POA: Diagnosis not present

## 2019-08-17 DIAGNOSIS — I10 Essential (primary) hypertension: Secondary | ICD-10-CM | POA: Diagnosis not present

## 2019-09-19 DIAGNOSIS — I10 Essential (primary) hypertension: Secondary | ICD-10-CM | POA: Diagnosis not present

## 2019-09-19 DIAGNOSIS — E119 Type 2 diabetes mellitus without complications: Secondary | ICD-10-CM | POA: Diagnosis not present

## 2019-09-19 DIAGNOSIS — E1169 Type 2 diabetes mellitus with other specified complication: Secondary | ICD-10-CM | POA: Diagnosis not present

## 2019-09-19 DIAGNOSIS — E7800 Pure hypercholesterolemia, unspecified: Secondary | ICD-10-CM | POA: Diagnosis not present

## 2019-10-19 DIAGNOSIS — E119 Type 2 diabetes mellitus without complications: Secondary | ICD-10-CM | POA: Diagnosis not present

## 2019-10-19 DIAGNOSIS — E7800 Pure hypercholesterolemia, unspecified: Secondary | ICD-10-CM | POA: Diagnosis not present

## 2019-10-19 DIAGNOSIS — I10 Essential (primary) hypertension: Secondary | ICD-10-CM | POA: Diagnosis not present

## 2019-10-19 DIAGNOSIS — E1169 Type 2 diabetes mellitus with other specified complication: Secondary | ICD-10-CM | POA: Diagnosis not present

## 2019-11-14 DIAGNOSIS — E7800 Pure hypercholesterolemia, unspecified: Secondary | ICD-10-CM | POA: Diagnosis not present

## 2019-11-14 DIAGNOSIS — E1169 Type 2 diabetes mellitus with other specified complication: Secondary | ICD-10-CM | POA: Diagnosis not present

## 2019-11-14 DIAGNOSIS — I10 Essential (primary) hypertension: Secondary | ICD-10-CM | POA: Diagnosis not present

## 2019-12-20 DIAGNOSIS — E1169 Type 2 diabetes mellitus with other specified complication: Secondary | ICD-10-CM | POA: Diagnosis not present

## 2019-12-20 DIAGNOSIS — E119 Type 2 diabetes mellitus without complications: Secondary | ICD-10-CM | POA: Diagnosis not present

## 2019-12-20 DIAGNOSIS — I10 Essential (primary) hypertension: Secondary | ICD-10-CM | POA: Diagnosis not present

## 2019-12-20 DIAGNOSIS — E7800 Pure hypercholesterolemia, unspecified: Secondary | ICD-10-CM | POA: Diagnosis not present

## 2020-01-17 DIAGNOSIS — E7800 Pure hypercholesterolemia, unspecified: Secondary | ICD-10-CM | POA: Diagnosis not present

## 2020-01-17 DIAGNOSIS — E1169 Type 2 diabetes mellitus with other specified complication: Secondary | ICD-10-CM | POA: Diagnosis not present

## 2020-01-17 DIAGNOSIS — I10 Essential (primary) hypertension: Secondary | ICD-10-CM | POA: Diagnosis not present

## 2020-02-07 DIAGNOSIS — I10 Essential (primary) hypertension: Secondary | ICD-10-CM | POA: Diagnosis not present

## 2020-02-07 DIAGNOSIS — E119 Type 2 diabetes mellitus without complications: Secondary | ICD-10-CM | POA: Diagnosis not present

## 2020-02-07 DIAGNOSIS — Z8673 Personal history of transient ischemic attack (TIA), and cerebral infarction without residual deficits: Secondary | ICD-10-CM | POA: Diagnosis not present

## 2020-02-07 DIAGNOSIS — E1169 Type 2 diabetes mellitus with other specified complication: Secondary | ICD-10-CM | POA: Diagnosis not present

## 2020-02-07 DIAGNOSIS — I779 Disorder of arteries and arterioles, unspecified: Secondary | ICD-10-CM | POA: Diagnosis not present

## 2020-02-07 DIAGNOSIS — Z8679 Personal history of other diseases of the circulatory system: Secondary | ICD-10-CM | POA: Diagnosis not present

## 2020-02-07 DIAGNOSIS — E7800 Pure hypercholesterolemia, unspecified: Secondary | ICD-10-CM | POA: Diagnosis not present

## 2020-02-07 DIAGNOSIS — E041 Nontoxic single thyroid nodule: Secondary | ICD-10-CM | POA: Diagnosis not present

## 2020-02-07 DIAGNOSIS — E538 Deficiency of other specified B group vitamins: Secondary | ICD-10-CM | POA: Diagnosis not present

## 2020-02-12 DIAGNOSIS — E1169 Type 2 diabetes mellitus with other specified complication: Secondary | ICD-10-CM | POA: Diagnosis not present

## 2020-02-12 DIAGNOSIS — I10 Essential (primary) hypertension: Secondary | ICD-10-CM | POA: Diagnosis not present

## 2020-02-12 DIAGNOSIS — E7800 Pure hypercholesterolemia, unspecified: Secondary | ICD-10-CM | POA: Diagnosis not present

## 2020-03-12 DIAGNOSIS — E1169 Type 2 diabetes mellitus with other specified complication: Secondary | ICD-10-CM | POA: Diagnosis not present

## 2020-03-12 DIAGNOSIS — I10 Essential (primary) hypertension: Secondary | ICD-10-CM | POA: Diagnosis not present

## 2020-03-12 DIAGNOSIS — E7800 Pure hypercholesterolemia, unspecified: Secondary | ICD-10-CM | POA: Diagnosis not present

## 2020-07-09 DIAGNOSIS — I10 Essential (primary) hypertension: Secondary | ICD-10-CM | POA: Diagnosis not present

## 2020-07-09 DIAGNOSIS — Z8673 Personal history of transient ischemic attack (TIA), and cerebral infarction without residual deficits: Secondary | ICD-10-CM | POA: Diagnosis not present

## 2020-07-09 DIAGNOSIS — E7800 Pure hypercholesterolemia, unspecified: Secondary | ICD-10-CM | POA: Diagnosis not present

## 2020-07-09 DIAGNOSIS — E538 Deficiency of other specified B group vitamins: Secondary | ICD-10-CM | POA: Diagnosis not present

## 2020-07-09 DIAGNOSIS — E041 Nontoxic single thyroid nodule: Secondary | ICD-10-CM | POA: Diagnosis not present

## 2020-07-09 DIAGNOSIS — M25512 Pain in left shoulder: Secondary | ICD-10-CM | POA: Diagnosis not present

## 2020-07-09 DIAGNOSIS — E1169 Type 2 diabetes mellitus with other specified complication: Secondary | ICD-10-CM | POA: Diagnosis not present

## 2020-07-09 DIAGNOSIS — R150 Incomplete defecation: Secondary | ICD-10-CM | POA: Diagnosis not present

## 2020-07-09 DIAGNOSIS — I779 Disorder of arteries and arterioles, unspecified: Secondary | ICD-10-CM | POA: Diagnosis not present

## 2020-07-11 ENCOUNTER — Other Ambulatory Visit: Payer: Self-pay | Admitting: Family Medicine

## 2020-07-11 DIAGNOSIS — Z1211 Encounter for screening for malignant neoplasm of colon: Secondary | ICD-10-CM | POA: Diagnosis not present

## 2020-07-11 DIAGNOSIS — E041 Nontoxic single thyroid nodule: Secondary | ICD-10-CM

## 2020-07-11 DIAGNOSIS — R159 Full incontinence of feces: Secondary | ICD-10-CM | POA: Diagnosis not present

## 2020-07-11 DIAGNOSIS — R634 Abnormal weight loss: Secondary | ICD-10-CM | POA: Diagnosis not present

## 2020-07-12 ENCOUNTER — Other Ambulatory Visit: Payer: Self-pay | Admitting: Gastroenterology

## 2020-07-12 DIAGNOSIS — R634 Abnormal weight loss: Secondary | ICD-10-CM

## 2020-07-16 DIAGNOSIS — M25512 Pain in left shoulder: Secondary | ICD-10-CM | POA: Diagnosis not present

## 2020-07-29 ENCOUNTER — Other Ambulatory Visit: Payer: Medicare HMO

## 2020-08-26 ENCOUNTER — Ambulatory Visit
Admission: RE | Admit: 2020-08-26 | Discharge: 2020-08-26 | Disposition: A | Discharge status: Home / Self Care | Payer: Medicare HMO | Source: Ambulatory Visit | Attending: Gastroenterology | Admitting: Gastroenterology

## 2020-08-26 ENCOUNTER — Other Ambulatory Visit: Payer: Self-pay

## 2020-08-26 DIAGNOSIS — R634 Abnormal weight loss: Secondary | ICD-10-CM | POA: Diagnosis not present

## 2020-09-26 ENCOUNTER — Ambulatory Visit: Payer: Medicare HMO | Admitting: Physical Therapy

## 2021-03-21 DIAGNOSIS — H6123 Impacted cerumen, bilateral: Secondary | ICD-10-CM | POA: Diagnosis not present

## 2021-03-21 DIAGNOSIS — H60393 Other infective otitis externa, bilateral: Secondary | ICD-10-CM | POA: Diagnosis not present

## 2021-05-12 DIAGNOSIS — I10 Essential (primary) hypertension: Secondary | ICD-10-CM | POA: Diagnosis not present

## 2021-05-12 DIAGNOSIS — N1832 Chronic kidney disease, stage 3b: Secondary | ICD-10-CM | POA: Diagnosis not present

## 2021-05-12 DIAGNOSIS — E1169 Type 2 diabetes mellitus with other specified complication: Secondary | ICD-10-CM | POA: Diagnosis not present

## 2021-05-12 DIAGNOSIS — E7800 Pure hypercholesterolemia, unspecified: Secondary | ICD-10-CM | POA: Diagnosis not present

## 2021-07-30 DIAGNOSIS — E1169 Type 2 diabetes mellitus with other specified complication: Secondary | ICD-10-CM | POA: Diagnosis not present

## 2021-07-30 DIAGNOSIS — I779 Disorder of arteries and arterioles, unspecified: Secondary | ICD-10-CM | POA: Diagnosis not present

## 2021-07-30 DIAGNOSIS — E538 Deficiency of other specified B group vitamins: Secondary | ICD-10-CM | POA: Diagnosis not present

## 2021-07-30 DIAGNOSIS — I129 Hypertensive chronic kidney disease with stage 1 through stage 4 chronic kidney disease, or unspecified chronic kidney disease: Secondary | ICD-10-CM | POA: Diagnosis not present

## 2021-07-30 DIAGNOSIS — Z0000 Encounter for general adult medical examination without abnormal findings: Secondary | ICD-10-CM | POA: Diagnosis not present

## 2021-07-30 DIAGNOSIS — E041 Nontoxic single thyroid nodule: Secondary | ICD-10-CM | POA: Diagnosis not present

## 2021-07-30 DIAGNOSIS — N1831 Chronic kidney disease, stage 3a: Secondary | ICD-10-CM | POA: Diagnosis not present

## 2021-07-30 DIAGNOSIS — I10 Essential (primary) hypertension: Secondary | ICD-10-CM | POA: Diagnosis not present

## 2021-07-30 DIAGNOSIS — E7800 Pure hypercholesterolemia, unspecified: Secondary | ICD-10-CM | POA: Diagnosis not present

## 2021-07-30 DIAGNOSIS — I700 Atherosclerosis of aorta: Secondary | ICD-10-CM | POA: Diagnosis not present

## 2021-08-07 ENCOUNTER — Encounter (HOSPITAL_BASED_OUTPATIENT_CLINIC_OR_DEPARTMENT_OTHER): Payer: Self-pay | Admitting: Emergency Medicine

## 2021-08-07 ENCOUNTER — Emergency Department (HOSPITAL_BASED_OUTPATIENT_CLINIC_OR_DEPARTMENT_OTHER)
Admission: EM | Admit: 2021-08-07 | Discharge: 2021-08-08 | Disposition: A | Discharge status: Home / Self Care | Payer: Medicare HMO | Attending: Emergency Medicine | Admitting: Emergency Medicine

## 2021-08-07 ENCOUNTER — Other Ambulatory Visit: Payer: Self-pay

## 2021-08-07 ENCOUNTER — Emergency Department (HOSPITAL_BASED_OUTPATIENT_CLINIC_OR_DEPARTMENT_OTHER): Payer: Medicare HMO

## 2021-08-07 DIAGNOSIS — Z87891 Personal history of nicotine dependence: Secondary | ICD-10-CM | POA: Insufficient documentation

## 2021-08-07 DIAGNOSIS — Y9301 Activity, walking, marching and hiking: Secondary | ICD-10-CM | POA: Insufficient documentation

## 2021-08-07 DIAGNOSIS — E119 Type 2 diabetes mellitus without complications: Secondary | ICD-10-CM | POA: Insufficient documentation

## 2021-08-07 DIAGNOSIS — R55 Syncope and collapse: Secondary | ICD-10-CM | POA: Diagnosis not present

## 2021-08-07 DIAGNOSIS — S0181XA Laceration without foreign body of other part of head, initial encounter: Secondary | ICD-10-CM

## 2021-08-07 DIAGNOSIS — Z23 Encounter for immunization: Secondary | ICD-10-CM | POA: Diagnosis not present

## 2021-08-07 DIAGNOSIS — W1830XA Fall on same level, unspecified, initial encounter: Secondary | ICD-10-CM | POA: Diagnosis not present

## 2021-08-07 DIAGNOSIS — S0083XA Contusion of other part of head, initial encounter: Secondary | ICD-10-CM

## 2021-08-07 DIAGNOSIS — Z7984 Long term (current) use of oral hypoglycemic drugs: Secondary | ICD-10-CM | POA: Insufficient documentation

## 2021-08-07 DIAGNOSIS — S0990XA Unspecified injury of head, initial encounter: Secondary | ICD-10-CM | POA: Diagnosis not present

## 2021-08-07 DIAGNOSIS — I10 Essential (primary) hypertension: Secondary | ICD-10-CM | POA: Diagnosis not present

## 2021-08-07 DIAGNOSIS — Z7982 Long term (current) use of aspirin: Secondary | ICD-10-CM | POA: Insufficient documentation

## 2021-08-07 DIAGNOSIS — Z79899 Other long term (current) drug therapy: Secondary | ICD-10-CM | POA: Insufficient documentation

## 2021-08-07 LAB — CBC
HCT: 44.2 % (ref 36.0–46.0)
Hemoglobin: 14 g/dL (ref 12.0–15.0)
MCH: 27.3 pg (ref 26.0–34.0)
MCHC: 31.7 g/dL (ref 30.0–36.0)
MCV: 86.3 fL (ref 80.0–100.0)
Platelets: 166 10*3/uL (ref 150–400)
RBC: 5.12 MIL/uL — ABNORMAL HIGH (ref 3.87–5.11)
RDW: 14.2 % (ref 11.5–15.5)
WBC: 6.4 10*3/uL (ref 4.0–10.5)
nRBC: 0 % (ref 0.0–0.2)

## 2021-08-07 LAB — BASIC METABOLIC PANEL
Anion gap: 9 (ref 5–15)
BUN: 25 mg/dL — ABNORMAL HIGH (ref 8–23)
CO2: 30 mmol/L (ref 22–32)
Calcium: 10.5 mg/dL — ABNORMAL HIGH (ref 8.9–10.3)
Chloride: 98 mmol/L (ref 98–111)
Creatinine, Ser: 1.75 mg/dL — ABNORMAL HIGH (ref 0.44–1.00)
GFR, Estimated: 29 mL/min — ABNORMAL LOW (ref >60)
Glucose, Bld: 101 mg/dL — ABNORMAL HIGH (ref 70–99)
Potassium: 4.9 mmol/L (ref 3.5–5.1)
Sodium: 137 mmol/L (ref 135–145)

## 2021-08-07 LAB — CBG MONITORING, ED: Glucose-Capillary: 120 mg/dL — ABNORMAL HIGH (ref 70–99)

## 2021-08-07 NOTE — ED Triage Notes (Addendum)
Pt reports walking outside this afternoon appx 1400 today when she began to feel weak.  Sat down to rest, stood up.  Feels like she fainted bc she woke up to people helping her stand up, had laceration to left forehead area. Bleeding controlled at time of triage.   Pt AOx4, hypertensive at triage. Stroke screen neg, VAN neg.

## 2021-08-07 NOTE — ED Notes (Signed)
Pt now returned to room from CT dept via stretcher escorted by Rad tech - pt awake and alert; no signs of acute distress.

## 2021-08-07 NOTE — ED Notes (Signed)
Pt transported to CT ?

## 2021-08-08 ENCOUNTER — Encounter (HOSPITAL_BASED_OUTPATIENT_CLINIC_OR_DEPARTMENT_OTHER): Payer: Self-pay | Admitting: Emergency Medicine

## 2021-08-08 LAB — URINALYSIS, ROUTINE W REFLEX MICROSCOPIC
Bilirubin Urine: NEGATIVE
Glucose, UA: NEGATIVE mg/dL
Hgb urine dipstick: NEGATIVE
Ketones, ur: 15 mg/dL — AB
Leukocytes,Ua: NEGATIVE
Nitrite: NEGATIVE
Protein, ur: 30 mg/dL — AB
Specific Gravity, Urine: 1.016 (ref 1.005–1.030)
pH: 6 (ref 5.0–8.0)

## 2021-08-08 MED ORDER — TETANUS-DIPHTH-ACELL PERTUSSIS 5-2.5-18.5 LF-MCG/0.5 IM SUSY
0.5000 mL | PREFILLED_SYRINGE | Freq: Once | INTRAMUSCULAR | Status: AC
  Administered 2021-08-08: 0.5 mL via INTRAMUSCULAR
  Filled 2021-08-08: qty 0.5

## 2021-08-08 MED ORDER — SODIUM CHLORIDE 0.9 % IV BOLUS
500.0000 mL | Freq: Once | INTRAVENOUS | Status: AC
  Administered 2021-08-08: 500 mL via INTRAVENOUS

## 2021-08-08 MED ORDER — LIDOCAINE-EPINEPHRINE (PF) 2 %-1:200000 IJ SOLN
10.0000 mL | Freq: Once | INTRAMUSCULAR | Status: AC
  Administered 2021-08-08: 10 mL via INTRADERMAL
  Filled 2021-08-08: qty 20

## 2021-08-08 NOTE — ED Provider Notes (Addendum)
[facility] Provider Note: Lowella Dell, MD, FACEP  CSN: [id number] MRN: [id number] ARRIVAL: 08/07/21 at 2011 ROOM: DB010/DB010   CHIEF COMPLAINT  Syncope   HISTORY OF PRESENT ILLNESS  08/08/21 12:39 AM Kathleen Figueroa is a 86 y.o. female who was walking outside about 2 PM yesterday when she began to feel weak.  She sat down to rest and then stood up.  She believes she fainted because she woke up on the ground with people helping her to stand up.  She has a laceration to her left forehead.  She rates associated pain as a 2 out of 10.  She never had any chest pain or shortness of breath with this.  She denies neck pain.   Past Medical History:  Diagnosis Date   Acute renal failure (ARF) (HCC) 05/06/2018   Arthritis    Diabetes mellitus type 2, uncontrolled, 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   H/O detached retina repair    Hypertension    TIA (transient ischemic attack)     Past Surgical History:  Procedure Laterality Date   ENDARTERECTOMY Left 01/19/2014   Procedure: ENDARTERECTOMY CAROTID-LEFT;  Surgeon: Nada Libman, MD;  Location: [facility] OR;  Service: Vascular;  Laterality: Left;   EYE SURGERY     lens removed from right eye   PATCH ANGIOPLASTY Left 01/19/2014   Procedure: PATCH ANGIOPLASTY, LEFT CAROTID ARTERY USING HEMASHIELD PLATINUM FINESSE PATCH;  Surgeon: Nada Libman, MD;  Location: [facility] OR;  Service: Vascular;  Laterality: Left;   TONSILLECTOMY      Family History  Problem Relation Age of Onset   Hypertension Mother    Diabetes Mother    Hypertension Father     Social History   Tobacco Use   Smoking status: Former    Types: Cigarettes    Quit date: 02/09/1978    Years since quitting: 43.5   Smokeless tobacco: Never  Substance Use Topics   Alcohol use: No    Alcohol/week: 0.0 standard drinks of alcohol   Drug use: No    Prior to Admission medications   Medication Sig Start Date End Date Taking? Authorizing Provider  amLODipine (NORVASC) 10 MG  tablet Take 1 tablet (10 mg total) by mouth daily. 05/07/18   Elgergawy, Leana Roe, MD  aspirin 81 MG EC tablet Take 1 tablet (81 mg total) by mouth daily. 05/07/18   Elgergawy, Leana Roe, MD  atorvastatin (LIPITOR) 80 MG tablet Take 1 tablet (80 mg total) by mouth daily. 05/07/18   Elgergawy, Leana Roe, MD  BAYER CONTOUR NEXT TEST test strip  04/24/14   [provider]  cholecalciferol (VITAMIN D) 1000 units tablet Take 1,000 Units by mouth daily.     [provider]  metFORMIN (GLUCOPHAGE) 1000 MG tablet Take 1 tablet (1,000 mg total) by mouth daily with breakfast. Hold for 2 days as you received IV contrast, and resume on 05/10/2018 05/10/18   Elgergawy, Leana Roe, MD  Multiple Vitamin (MULTIVITAMIN WITH MINERALS) TABS tablet Take 1 tablet by mouth daily. Centrum    [provider]  valsartan (DIOVAN) 320 MG tablet Take 1 tablet (320 mg total) by mouth daily. 05/07/18   Elgergawy, Leana Roe, MD    Allergies Aspirin   REVIEW OF SYSTEMS  Negative except as noted here or in the History of Present Illness.   PHYSICAL EXAMINATION  Initial Vital Signs Blood pressure (!) 147/80, pulse 89, temperature 98.8 F (37.1 C), resp. rate 13, height 5\' 5"  (1.651 m), SpO2 95 %.  Examination General: Well-developed, well-nourished female in no acute distress; appearance consistent with age of record HENT: normocephalic; left forehead laceration with surrounding hematoma:    Eyes: Right pupil irregular; left pupil round and reactive to light; extraocular muscles intact; arcus senilis bilaterally Neck: supple; nontender Heart: regular rate and rhythm Lungs: clear to auscultation bilaterally Abdomen: soft; nondistended; nontender; bowel sounds present Extremities: No deformity; full range of motion; trace edema of lower legs Neurologic: Awake, alert and oriented; motor function intact in all extremities and symmetric; no facial droop Skin: Warm and dry Psychiatric: Normal mood and  affect   RESULTS  Summary of this visit's results, reviewed and interpreted by myself:   EKG Interpretation  Date/Time:  Thursday August 07 2021 20:25:54 EDT Ventricular Rate:  98 PR Interval:  154 QRS Duration: 66 QT Interval:  342 QTC Calculation: 436 R Axis:   -17 Text Interpretation: Normal sinus rhythm Normal ECG No significant change was found Confirmed by Flordia Kassem, Jonny Ruiz ([id number]) on 08/08/2021 1:25:48 AM       Laboratory Studies: Results for orders placed or performed during the hospital encounter of 08/07/21 (from the past 24 hour(s))  CBG monitoring, ED     Status: Abnormal   Collection Time: 08/07/21  8:19 PM  Result Value Ref Range   Glucose-Capillary 120 (H) 70 - 99 mg/dL  Basic metabolic panel     Status: Abnormal   Collection Time: 08/07/21 10:10 PM  Result Value Ref Range   Sodium 137 135 - 145 mmol/L   Potassium 4.9 3.5 - 5.1 mmol/L   Chloride 98 98 - 111 mmol/L   CO2 30 22 - 32 mmol/L   Glucose, Bld 101 (H) 70 - 99 mg/dL   BUN 25 (H) 8 - 23 mg/dL   Creatinine, Ser 7.79 (H) 0.44 - 1.00 mg/dL   Calcium 39.0 (H) 8.9 - 10.3 mg/dL   GFR, Estimated 29 (L) >60 mL/min   Anion gap 9 5 - 15  CBC     Status: Abnormal   Collection Time: 08/07/21 10:10 PM  Result Value Ref Range   WBC 6.4 4.0 - 10.5 K/uL   RBC 5.12 (H) 3.87 - 5.11 MIL/uL   Hemoglobin 14.0 12.0 - 15.0 g/dL   HCT 30.0 92.3 - 30.0 %   MCV 86.3 80.0 - 100.0 fL   MCH 27.3 26.0 - 34.0 pg   MCHC 31.7 30.0 - 36.0 g/dL   RDW 76.2 26.3 - 33.5 %   Platelets 166 150 - 400 K/uL   nRBC 0.0 0.0 - 0.2 %   Imaging Studies: CT HEAD WO CONTRAST  Result Date: 08/07/2021 CLINICAL DATA:  Head trauma, minor (Age >= 65y) fall with head injury EXAM: CT HEAD WITHOUT CONTRAST TECHNIQUE: Contiguous axial images were obtained from the base of the skull through the vertex without intravenous contrast. RADIATION DOSE REDUCTION: This exam was performed according to the departmental dose-optimization program which includes  automated exposure control, adjustment of the mA and/or kV according to patient size and/or use of iterative reconstruction technique. COMPARISON:  05/06/2018 FINDINGS: Brain: Normal anatomic configuration. Parenchymal volume loss is commensurate with the patient's age. Mild periventricular white matter changes are present likely reflecting the sequela of small vessel ischemia. Remote lacunar infarcts are noted within the pons, lentiform nuclei bilaterally, and insular cortices bilaterally. Stable cortical encephalomalacia involving the left temporoparietal cortex in keeping with remote infarct. No abnormal intra or extra-axial mass lesion or fluid collection. No abnormal mass effect or midline shift. No evidence  of acute intracranial hemorrhage or infarct. Ventricular size is normal. Cerebellum unremarkable. Vascular: No asymmetric hyperdense vasculature at the skull base. Skull: Intact Sinuses/Orbits: Paranasal sinuses are clear. Ocular lenses have been removed. Orbits are otherwise unremarkable. Other: Mastoid air cells and middle ear cavities are clear. Left frontal scalp laceration noted. No retained radiopaque foreign body. IMPRESSION: 1. Left frontal scalp laceration. No retained radiopaque foreign body. No calvarial fracture. 2. No acute intracranial hemorrhage or infarct. 3. Stable senescent changes and remote infarcts. Electronically Signed   By: Helyn Numbers M.D.   On: 08/07/2021 21:31    ED COURSE and MDM  Nursing notes, initial and subsequent vitals signs, including pulse oximetry, reviewed and interpreted by myself.  Vitals:   08/07/21 2100 08/07/21 2210 08/08/21 0000 08/08/21 0030  BP: (!) 165/80 (!) 158/84 (!) 147/80 (!) 148/71  Pulse: 92 86 89 89  Resp: 17 19 13 14   Temp:      SpO2: 100% 100% 95% 96%  Height:       Medications  sodium chloride 0.9 % bolus 500 mL (has no administration in time range)  Tdap (BOOSTRIX) injection 0.5 mL (0.5 mLs Intramuscular Given 08/08/21 0116)   lidocaine-EPINEPHrine (XYLOCAINE W/EPI) 2 %-1:200000 (PF) injection 10 mL (10 mLs Intradermal Given 08/08/21 0117)   1:24 AM The cause of the patient's syncope is unclear.  Her urine specific gravity is 1.016 which is not consistent with dehydration.  Her EKG is normal and unchanged from prior.  Her kidney function (creatinine 1.75) is somewhat worse than prior (creatinine 1.60 three years ago).  Her blood sugar was normal on arrival.  We will give an IV fluid bolus prior to discharge.  She has an appointment pending with nephrology.  This is not new appointment as she has not been followed by nephrology in the past.   PROCEDURES  Procedures LACERATION REPAIR Performed by: 08/10/21 Milad Bublitz Authorized by: Carlisle Beers Ellisyn Icenhower Consent: Verbal consent obtained. Risks and benefits: risks, benefits and alternatives were discussed Consent given by: patient Patient identity confirmed: provided demographic data Prepped and Draped in normal sterile fashion Wound explored  Laceration Location: Left forehead  Laceration Length: 2 cm  No Foreign Bodies seen or palpated  Anesthesia: local infiltration  Local anesthetic: lidocaine 2% with epinephrine  Anesthetic total: 2 ml  Irrigation method: syringe Amount of cleaning: standard  Skin closure: 5-0 Prolene  Number of sutures: 1  Technique: Running  Patient tolerance: Patient tolerated the procedure well with no immediate complications.    ED DIAGNOSES     ICD-10-CM   1. Syncope and collapse  R55     2. Forehead laceration, initial encounter  S01.81XA     3. Traumatic hematoma of forehead, initial encounter  Carlisle Beers          [id number], MD 08/08/21 0147    08/10/21, MD 08/08/21 08/10/21

## 2021-08-08 NOTE — ED Notes (Signed)
Pt agreeable with d/c plan as discussed by provider- this nurse has verbally reinforced d/c instructions and provided pt with written copy- pt acknowledges verbal understanding and denies any additional questions, concerns, needs- ambulatory independently at d/c escorted by family member, no distress vitals stable

## 2021-08-08 NOTE — ED Notes (Signed)
Anti-bacterial ointment applied to laceration(post suture) then nonadherent dressing placed over site and secured with tegaderm.  Well tolerated by pt

## 2021-08-15 DIAGNOSIS — S0181XD Laceration without foreign body of other part of head, subsequent encounter: Secondary | ICD-10-CM | POA: Diagnosis not present

## 2021-11-18 DIAGNOSIS — E119 Type 2 diabetes mellitus without complications: Secondary | ICD-10-CM | POA: Diagnosis not present

## 2022-02-12 DIAGNOSIS — U071 COVID-19: Secondary | ICD-10-CM | POA: Diagnosis not present

## 2022-08-03 DIAGNOSIS — E7800 Pure hypercholesterolemia, unspecified: Secondary | ICD-10-CM | POA: Diagnosis not present

## 2022-08-03 DIAGNOSIS — I779 Disorder of arteries and arterioles, unspecified: Secondary | ICD-10-CM | POA: Diagnosis not present

## 2022-08-03 DIAGNOSIS — E1169 Type 2 diabetes mellitus with other specified complication: Secondary | ICD-10-CM | POA: Diagnosis not present

## 2022-08-03 DIAGNOSIS — I700 Atherosclerosis of aorta: Secondary | ICD-10-CM | POA: Diagnosis not present

## 2022-08-03 DIAGNOSIS — N1832 Chronic kidney disease, stage 3b: Secondary | ICD-10-CM | POA: Diagnosis not present

## 2022-08-03 DIAGNOSIS — E1122 Type 2 diabetes mellitus with diabetic chronic kidney disease: Secondary | ICD-10-CM | POA: Diagnosis not present

## 2022-08-03 DIAGNOSIS — Z6823 Body mass index (BMI) 23.0-23.9, adult: Secondary | ICD-10-CM | POA: Diagnosis not present

## 2022-08-03 DIAGNOSIS — Z0000 Encounter for general adult medical examination without abnormal findings: Secondary | ICD-10-CM | POA: Diagnosis not present

## 2022-08-03 DIAGNOSIS — I10 Essential (primary) hypertension: Secondary | ICD-10-CM | POA: Diagnosis not present

## 2022-08-03 DIAGNOSIS — E538 Deficiency of other specified B group vitamins: Secondary | ICD-10-CM | POA: Diagnosis not present

## 2022-08-03 DIAGNOSIS — Z1389 Encounter for screening for other disorder: Secondary | ICD-10-CM | POA: Diagnosis not present

## 2022-11-12 ENCOUNTER — Other Ambulatory Visit: Payer: Self-pay

## 2022-11-12 ENCOUNTER — Observation Stay (HOSPITAL_COMMUNITY): Payer: Medicare HMO

## 2022-11-12 ENCOUNTER — Observation Stay (HOSPITAL_BASED_OUTPATIENT_CLINIC_OR_DEPARTMENT_OTHER)
Admission: EM | Admit: 2022-11-12 | Discharge: 2022-11-13 | Disposition: A | Discharge status: Home / Self Care | Payer: Medicare HMO | Attending: Internal Medicine | Admitting: Internal Medicine

## 2022-11-12 ENCOUNTER — Encounter (HOSPITAL_BASED_OUTPATIENT_CLINIC_OR_DEPARTMENT_OTHER): Payer: Self-pay | Admitting: Emergency Medicine

## 2022-11-12 ENCOUNTER — Other Ambulatory Visit (HOSPITAL_BASED_OUTPATIENT_CLINIC_OR_DEPARTMENT_OTHER): Payer: Self-pay

## 2022-11-12 ENCOUNTER — Emergency Department (HOSPITAL_BASED_OUTPATIENT_CLINIC_OR_DEPARTMENT_OTHER): Payer: Medicare HMO

## 2022-11-12 DIAGNOSIS — R41841 Cognitive communication deficit: Secondary | ICD-10-CM | POA: Diagnosis not present

## 2022-11-12 DIAGNOSIS — R29818 Other symptoms and signs involving the nervous system: Secondary | ICD-10-CM | POA: Diagnosis not present

## 2022-11-12 DIAGNOSIS — Z79899 Other long term (current) drug therapy: Secondary | ICD-10-CM | POA: Insufficient documentation

## 2022-11-12 DIAGNOSIS — I6529 Occlusion and stenosis of unspecified carotid artery: Secondary | ICD-10-CM | POA: Diagnosis not present

## 2022-11-12 DIAGNOSIS — Z7982 Long term (current) use of aspirin: Secondary | ICD-10-CM | POA: Insufficient documentation

## 2022-11-12 DIAGNOSIS — E7800 Pure hypercholesterolemia, unspecified: Secondary | ICD-10-CM

## 2022-11-12 DIAGNOSIS — I10 Essential (primary) hypertension: Secondary | ICD-10-CM | POA: Insufficient documentation

## 2022-11-12 DIAGNOSIS — I639 Cerebral infarction, unspecified: Secondary | ICD-10-CM | POA: Diagnosis not present

## 2022-11-12 DIAGNOSIS — Z7984 Long term (current) use of oral hypoglycemic drugs: Secondary | ICD-10-CM | POA: Insufficient documentation

## 2022-11-12 DIAGNOSIS — I6782 Cerebral ischemia: Secondary | ICD-10-CM | POA: Diagnosis not present

## 2022-11-12 DIAGNOSIS — E785 Hyperlipidemia, unspecified: Secondary | ICD-10-CM | POA: Insufficient documentation

## 2022-11-12 DIAGNOSIS — Z87891 Personal history of nicotine dependence: Secondary | ICD-10-CM | POA: Diagnosis not present

## 2022-11-12 DIAGNOSIS — Z9889 Other specified postprocedural states: Secondary | ICD-10-CM | POA: Insufficient documentation

## 2022-11-12 DIAGNOSIS — R29810 Facial weakness: Secondary | ICD-10-CM | POA: Diagnosis present

## 2022-11-12 DIAGNOSIS — I63019 Cerebral infarction due to thrombosis of unspecified vertebral artery: Secondary | ICD-10-CM

## 2022-11-12 DIAGNOSIS — I6523 Occlusion and stenosis of bilateral carotid arteries: Secondary | ICD-10-CM | POA: Diagnosis not present

## 2022-11-12 DIAGNOSIS — E876 Hypokalemia: Secondary | ICD-10-CM | POA: Diagnosis not present

## 2022-11-12 DIAGNOSIS — R42 Dizziness and giddiness: Secondary | ICD-10-CM | POA: Diagnosis not present

## 2022-11-12 DIAGNOSIS — I779 Disorder of arteries and arterioles, unspecified: Secondary | ICD-10-CM | POA: Diagnosis present

## 2022-11-12 DIAGNOSIS — E119 Type 2 diabetes mellitus without complications: Secondary | ICD-10-CM | POA: Insufficient documentation

## 2022-11-12 DIAGNOSIS — Z1152 Encounter for screening for COVID-19: Secondary | ICD-10-CM | POA: Insufficient documentation

## 2022-11-12 DIAGNOSIS — R9089 Other abnormal findings on diagnostic imaging of central nervous system: Secondary | ICD-10-CM | POA: Diagnosis not present

## 2022-11-12 HISTORY — DX: Cerebral infarction, unspecified: I63.9

## 2022-11-12 LAB — URINALYSIS, ROUTINE W REFLEX MICROSCOPIC
Bilirubin Urine: NEGATIVE
Glucose, UA: NEGATIVE mg/dL
Hgb urine dipstick: NEGATIVE
Ketones, ur: NEGATIVE mg/dL
Leukocytes,Ua: NEGATIVE
Nitrite: NEGATIVE
Protein, ur: NEGATIVE mg/dL
Specific Gravity, Urine: 1.015 (ref 1.005–1.030)
pH: 7 (ref 5.0–8.0)

## 2022-11-12 LAB — DIFFERENTIAL
Abs Immature Granulocytes: 0.02 10*3/uL (ref 0.00–0.07)
Basophils Absolute: 0 10*3/uL (ref 0.0–0.1)
Basophils Relative: 1 %
Eosinophils Absolute: 0.1 10*3/uL (ref 0.0–0.5)
Eosinophils Relative: 1 %
Immature Granulocytes: 0 %
Lymphocytes Relative: 21 %
Lymphs Abs: 1.3 10*3/uL (ref 0.7–4.0)
Monocytes Absolute: 0.5 10*3/uL (ref 0.1–1.0)
Monocytes Relative: 8 %
Neutro Abs: 4.1 10*3/uL (ref 1.7–7.7)
Neutrophils Relative %: 69 %

## 2022-11-12 LAB — COMPREHENSIVE METABOLIC PANEL
ALT: 12 U/L (ref 0–44)
AST: 20 U/L (ref 15–41)
Albumin: 4.6 g/dL (ref 3.5–5.0)
Alkaline Phosphatase: 68 U/L (ref 38–126)
Anion gap: 10 (ref 5–15)
BUN: 39 mg/dL — ABNORMAL HIGH (ref 8–23)
CO2: 32 mmol/L (ref 22–32)
Calcium: 10 mg/dL (ref 8.9–10.3)
Chloride: 98 mmol/L (ref 98–111)
Creatinine, Ser: 1.3 mg/dL — ABNORMAL HIGH (ref 0.44–1.00)
GFR, Estimated: 40 mL/min — ABNORMAL LOW (ref >60)
Glucose, Bld: 119 mg/dL — ABNORMAL HIGH (ref 70–99)
Potassium: 3.4 mmol/L — ABNORMAL LOW (ref 3.5–5.1)
Sodium: 140 mmol/L (ref 135–145)
Total Bilirubin: 0.4 mg/dL (ref 0.3–1.2)
Total Protein: 8.1 g/dL (ref 6.5–8.1)

## 2022-11-12 LAB — CBG MONITORING, ED: Glucose-Capillary: 104 mg/dL — ABNORMAL HIGH (ref 70–99)

## 2022-11-12 LAB — CBC
HCT: 46.4 % — ABNORMAL HIGH (ref 36.0–46.0)
Hemoglobin: 15 g/dL (ref 12.0–15.0)
MCH: 27.8 pg (ref 26.0–34.0)
MCHC: 32.3 g/dL (ref 30.0–36.0)
MCV: 86.1 fL (ref 80.0–100.0)
Platelets: 170 10*3/uL (ref 150–400)
RBC: 5.39 MIL/uL — ABNORMAL HIGH (ref 3.87–5.11)
RDW: 14.2 % (ref 11.5–15.5)
WBC: 6 10*3/uL (ref 4.0–10.5)
nRBC: 0 % (ref 0.0–0.2)

## 2022-11-12 LAB — GLUCOSE, CAPILLARY: Glucose-Capillary: 135 mg/dL — ABNORMAL HIGH (ref 70–99)

## 2022-11-12 MED ORDER — ONDANSETRON HCL 4 MG/2ML IJ SOLN
4.0000 mg | Freq: Four times a day (QID) | INTRAMUSCULAR | Status: DC | PRN
  Administered 2022-11-12 – 2022-11-13 (×2): 4 mg via INTRAVENOUS
  Filled 2022-11-12 (×2): qty 2

## 2022-11-12 MED ORDER — AMLODIPINE BESYLATE 10 MG PO TABS
10.0000 mg | ORAL_TABLET | Freq: Every day | ORAL | Status: DC
  Administered 2022-11-13: 10 mg via ORAL
  Filled 2022-11-12: qty 1

## 2022-11-12 MED ORDER — ADULT MULTIVITAMIN W/MINERALS CH
1.0000 | ORAL_TABLET | Freq: Every day | ORAL | Status: DC
  Administered 2022-11-12 – 2022-11-13 (×2): 1 via ORAL
  Filled 2022-11-12 (×2): qty 1

## 2022-11-12 MED ORDER — SODIUM CHLORIDE 0.9 % IV SOLN
12.5000 mg | Freq: Four times a day (QID) | INTRAVENOUS | Status: DC | PRN
  Administered 2022-11-12: 12.5 mg via INTRAVENOUS
  Filled 2022-11-12: qty 0.5

## 2022-11-12 MED ORDER — POTASSIUM CHLORIDE 10 MEQ/100ML IV SOLN
10.0000 meq | INTRAVENOUS | Status: AC
  Administered 2022-11-12 – 2022-11-13 (×4): 10 meq via INTRAVENOUS
  Filled 2022-11-12 (×4): qty 100

## 2022-11-12 MED ORDER — STROKE: EARLY STAGES OF RECOVERY BOOK
Freq: Once | Status: AC
  Filled 2022-11-12: qty 1

## 2022-11-12 MED ORDER — ACETAMINOPHEN 160 MG/5ML PO SOLN: 650.0000 mg | ORAL | Status: DC | PRN

## 2022-11-12 MED ORDER — ATORVASTATIN CALCIUM 80 MG PO TABS
80.0000 mg | ORAL_TABLET | Freq: Every day | ORAL | Status: DC
  Administered 2022-11-12 – 2022-11-13 (×2): 80 mg via ORAL
  Filled 2022-11-12 (×2): qty 1

## 2022-11-12 MED ORDER — ENOXAPARIN SODIUM 40 MG/0.4ML IJ SOSY
40.0000 mg | PREFILLED_SYRINGE | INTRAMUSCULAR | Status: DC
  Administered 2022-11-12: 40 mg via SUBCUTANEOUS
  Filled 2022-11-12: qty 0.4

## 2022-11-12 MED ORDER — SODIUM CHLORIDE 0.9 % IV BOLUS
500.0000 mL | Freq: Once | INTRAVENOUS | Status: AC
  Administered 2022-11-12: 500 mL via INTRAVENOUS

## 2022-11-12 MED ORDER — SODIUM CHLORIDE 0.9 % IV SOLN: 100.0000 mL/h | INTRAVENOUS | Status: DC

## 2022-11-12 MED ORDER — ACETAMINOPHEN 650 MG RE SUPP: 650.0000 mg | RECTAL | Status: DC | PRN

## 2022-11-12 MED ORDER — POTASSIUM CHLORIDE CRYS ER 20 MEQ PO TBCR
40.0000 meq | EXTENDED_RELEASE_TABLET | Freq: Once | ORAL | Status: DC
  Filled 2022-11-12 (×2): qty 2

## 2022-11-12 MED ORDER — ACETAMINOPHEN 325 MG PO TABS
650.0000 mg | ORAL_TABLET | ORAL | Status: DC | PRN
  Administered 2022-11-12: 650 mg via ORAL
  Filled 2022-11-12: qty 2

## 2022-11-12 MED ORDER — ASPIRIN 81 MG PO TBEC
81.0000 mg | DELAYED_RELEASE_TABLET | Freq: Every day | ORAL | Status: DC
  Administered 2022-11-12 – 2022-11-13 (×2): 81 mg via ORAL
  Filled 2022-11-12 (×2): qty 1

## 2022-11-12 NOTE — ED Triage Notes (Signed)
Pt c/o dizziness, sweating, tingling in R jaw and weakness that started this am. Hx of DM, TIA.

## 2022-11-12 NOTE — Consult Note (Addendum)
Neurology Consultation  Reason for Consult: Stroke on MRI   Referring Physician: Dr. Tyson Babinski  CC: Right facial numbness and tingling   History is obtained from:patient and medical record   HPI: Kathleen Figueroa is an 87 y.o. female with past medical history of hypertension, diabetes, carotid stenosis s/p left carotid endarterectomy and TIAs who initially presented to the [facility] ED for evaluation of dizziness, numbness and tingling of the right side of her face.  Patient states symptoms started around 6:30 AM.  Patient denies any weakness, facial droop, slurred speech or vision changes.  She does endorse having a headache and nausea that started sometime this afternoon.  She states the numbness and dizziness symptoms lasted about an hour and had completely resolved. She still has a right frontal headache that she rates as 8/10 at the time of evaluation by Neurology attending.  MRI brain revealed a punctate acute infarct in the deep white matter of the right temporal lobe. The patient was transferred to [facility] for further stroke workup.  She currently is complaining of a cough that is different from her usual ACEI-related cough. The cough has been present for about 1 day. Does not complain of any fevers, but has been experiencing some malaise since yesterday, which was significant enough to cause her to cut her daily walk short.   ASA is on her home meds list, but she states that she stopped taking this previously.   LKW:  6:30 AM IV thrombolysis given?: no, outside window EVT:  No, symptoms not consistent with LVO Premorbid modified Rankin scale (mRS):  1-No significant post stroke disability and can perform usual duties with stroke symptoms  ROS: Full ROS was performed and is negative except as noted in the HPI.   Past Medical History:  Diagnosis Date   Acute renal failure (ARF) (HCC) 05/06/2018   Arthritis    Diabetes mellitus type 2, uncontrolled, with complications  05/06/2018   H/O detached retina repair    Hypertension    TIA (transient ischemic attack)       Family History  Problem Relation Age of Onset   Hypertension Mother    Diabetes Mother    Hypertension Father       Social History:   reports that she quit smoking about 44 years ago. Her smoking use included cigarettes. She has never used smokeless tobacco. She reports that she does not drink alcohol and does not use drugs.   Medications  Current Facility-Administered Medications:    [START ON 11/13/2022]  stroke: early stages of recovery book, , Does not apply, Once, Pokhrel, Laxman, MD   acetaminophen (TYLENOL) tablet 650 mg, 650 mg, Oral, Q4H PRN **OR** acetaminophen (TYLENOL) 160 MG/5ML solution 650 mg, 650 mg, Per Tube, Q4H PRN **OR** acetaminophen (TYLENOL) suppository 650 mg, 650 mg, Rectal, Q4H PRN, Pokhrel, Laxman, MD   [START ON 11/13/2022] amLODipine (NORVASC) tablet 10 mg, 10 mg, Oral, Daily, Pokhrel, Laxman, MD   aspirin EC tablet 81 mg, 81 mg, Oral, Daily, Pokhrel, Laxman, MD   atorvastatin (LIPITOR) tablet 80 mg, 80 mg, Oral, Daily, Pokhrel, Laxman, MD   enoxaparin (LOVENOX) injection 40 mg, 40 mg, Subcutaneous, Q24H, Pokhrel, Laxman, MD   multivitamin with minerals tablet 1 tablet, 1 tablet, Oral, Daily, Pokhrel, Laxman, MD    Exam: Current vital signs: BP (!) 187/81   Pulse 78   Temp 97.7 F (36.5 C) (Oral)   Resp 18   Ht 5\' 6"  (1.676 m)   Wt 77.1  kg   SpO2 94%   BMI 27.44 kg/m  Vital signs in last 24 hours: Temp:  [97.3 F (36.3 C)-97.7 F (36.5 C)] 97.7 F (36.5 C) (10/03 1600) Pulse Rate:  [77-89] 78 (10/03 1600) Resp:  [12-23] 18 (10/03 1520) BP: (161-223)/(81-100) 187/81 (10/03 1600) SpO2:  [93 %-100 %] 94 % (10/03 1600) Weight:  [77.1 kg] 77.1 kg (10/03 0737)  GENERAL: Awake, alert in NAD HEENT: - Normocephalic and atraumatic, dry mm  LUNGS - Clear to auscultation bilaterally with no wheezes CV - S1S2 RRR, no m/r/g, equal pulses  bilaterally. ABDOMEN - Soft, nontender, nondistended with normoactive BS Ext: warm, well perfused, intact peripheral pulses, mild lower extremity edema  NEURO:  Mental Status: AA&Ox3  Language: speech is clear.  Naming, repetition, fluency, and comprehension intact.  Cranial Nerves: PERRL  EOMI , visual fields full , no facial asymmetry,  facial sensation intact, hearing intact, tongue/uvula/soft palate midline, normal  sternocleidomastoid and trapezius muscle strength. No evidence of tongue atrophy   Motor: 5/5 in all 4 extremities Tone is normal and bulk is normal Sensation- Intact to light touch bilaterally Coordination: FTN intact bilaterally, no ataxia in BLE. Gait- deferred  NIHSS  1a Level of Conscious.: 0 1b LOC Questions: 0 1c LOC Commands: 0 2 Best Gaze: 0 3 Visual: 0 4 Facial Palsy: 0 5a Motor Arm - left: 0 5b Motor Arm - Right: 0 6a Motor Leg - Left: 0 6b Motor Leg - Right: 0 7 Limb Ataxia: 0 8 Sensory: 0 9 Best Language: 0 10 Dysarthria: 0 11 Extinct. and Inatten.: 0 TOTAL: 0   Labs I have reviewed labs in epic and the results pertinent to this consultation are:  CBC    Component Value Date/Time   WBC 6.0 11/12/2022 0804   RBC 5.39 (H) 11/12/2022 0804   HGB 15.0 11/12/2022 0804   HCT 46.4 (H) 11/12/2022 0804   PLT 170 11/12/2022 0804   MCV 86.1 11/12/2022 0804   MCH 27.8 11/12/2022 0804   MCHC 32.3 11/12/2022 0804   RDW 14.2 11/12/2022 0804   LYMPHSABS 1.3 11/12/2022 0804   MONOABS 0.5 11/12/2022 0804   EOSABS 0.1 11/12/2022 0804   BASOSABS 0.0 11/12/2022 0804    CMP     Component Value Date/Time   NA 140 11/12/2022 0804   K 3.4 (L) 11/12/2022 0804   CL 98 11/12/2022 0804   CO2 32 11/12/2022 0804   GLUCOSE 119 (H) 11/12/2022 0804   BUN 39 (H) 11/12/2022 0804   CREATININE 1.30 (H) 11/12/2022 0804   CALCIUM 10.0 11/12/2022 0804   PROT 8.1 11/12/2022 0804   ALBUMIN 4.6 11/12/2022 0804   AST 20 11/12/2022 0804   ALT 12 11/12/2022 0804    ALKPHOS 68 11/12/2022 0804   BILITOT 0.4 11/12/2022 0804   GFRNONAA 40 (L) 11/12/2022 0804   GFRAA 37 (L) 05/06/2018 2004    Lipid Panel     Component Value Date/Time   CHOL 210 (H) 05/06/2018 2004   TRIG 171 (H) 05/06/2018 2004   HDL 49 05/06/2018 2004   CHOLHDL 4.3 05/06/2018 2004   VLDL 34 05/06/2018 2004   LDLCALC 127 (H) 05/06/2018 2004    Lab Results  Component Value Date   HGBA1C 6.2 (H) 05/06/2018      Imaging I have reviewed the images obtained:  CT head: No acute process  MRI examination of the brain: Punctate acute infarct in the right temporal lobe   Assessment:  87 y.o. female with  a PMHx of HTN, DM, carotid stenosis s/p left carotid endarterectomy and TIAs who initially presented to the [facility] ED for evaluation of dizziness with numbness and tingling on the right side of her face.  Patient states symptoms started around 6:30 AM.  Patient denies any weakness, facial droop, slurred speech or vision changes.  She does endorse having a headache and nausea that started sometime this afternoon.  She states symptoms lasted about an hour and have now completely resolved, except for the headache, which is ongoing and currently rated as 8/10, involving her right temporal region and which is relieved somewhat with massage.  - Neurological exam is nonfocal - MRI brain: Punctate acute infarct in the right temporal lobe. Sequela of severe chronic microvascular ischemic change with multiple chronic infarcts. - Right temporal pain may merely be due to a stress-related tension-type headache. Temporal arteritis is somewhat higher on the DDx given her acute punctate stroke, but she does not complain of any vision loss. She does endorse some limb pain recently while walking.  - Possible underlying etiology of her acute punctate stroke includes acute focus of infarction secondary to chronic hypertensive microangiopathy (supported by the chronic ischemic changes seen on MRI  brain), cardioembolic and atherothrombotic with distal embolization (history of atherosclerotic carotid artery disease).  - Also complaining of a new quality to her chronic cough. Viral URI is on the DDx.     Recommendations: - HgbA1c, fasting lipid panel - Frequent neuro checks - TTE - CTA head and neck - Prophylactic therapy-Antiplatelet med: Aspirin - dose 81mg  and plavix 75mg  daily. Continue DAPT x 21 days then stop Plavix and continue with ASA indefinitely thereafter - Consider discontinuing atorvastatin due to her complaint of muscle aches - ESR and CRP (ordered) - Family is worried about a viral URI. They request Covid and flu testing. Will defer to primary team for this.  - BP management with permissive HTN until 6:30 AM tomorrow. Treat if SBP > 180. - Risk factor modification - Telemetry monitoring - PT consult, OT consult, Speech consult - Tylenol for headache pain - Stroke team to follow   Gevena Mart DNP, ACNPC-AG  [facility]  I have seen and examined the patient. I have formulated the assessment and recommendations. 87 year old female presenting with punctate acute right temporal lobe ischemic infarction. Exam nonfocal. Recommendations as above.  Electronically signed: Dr. Caryl Pina

## 2022-11-12 NOTE — Plan of Care (Signed)
  Problem: Education: Goal: Knowledge of General Education information will improve Description: Including pain rating scale, medication(s)/side effects and non-pharmacologic comfort measures Outcome: Progressing   Problem: Activity: Goal: Risk for activity intolerance will decrease Outcome: Progressing   Problem: Nutrition: Goal: Adequate nutrition will be maintained Outcome: Progressing   Problem: Pain Managment: Goal: General experience of comfort will improve Outcome: Progressing   Problem: Skin Integrity: Goal: Risk for impaired skin integrity will decrease Outcome: Progressing   Problem: Coping: Goal: Ability to adjust to condition or change in health will improve Outcome: Progressing   Problem: Nutritional: Goal: Maintenance of adequate nutrition will improve Outcome: Progressing   Problem: Education: Goal: Knowledge of disease or condition will improve Outcome: Progressing Goal: Knowledge of secondary prevention will improve (MUST DOCUMENT ALL) Outcome: Progressing Goal: Knowledge of patient specific risk factors will improve Loraine Leriche N/A or DELETE if not current risk factor) Outcome: Progressing   Problem: Ischemic Stroke/TIA Tissue Perfusion: Goal: Complications of ischemic stroke/TIA will be minimized Outcome: Progressing   Problem: Nutrition: Goal: Risk of aspiration will decrease Outcome: Progressing   Problem: Self-Care: Goal: Verbalization of feelings and concerns over difficulty with self-care will improve Outcome: Progressing

## 2022-11-12 NOTE — ED Provider Notes (Signed)
[place] EMERGENCY DEPARTMENT AT [facility] Provider Note   CSN: [id number] Arrival date & time: 11/12/22  0865     History  Chief Complaint  Patient presents with   Dizziness   Weakness    Kathleen Figueroa is a 87 y.o. female.   Dizziness Associated symptoms: weakness   Weakness Associated symptoms: dizziness      Patient presents to the emergency room for evaluation of dizziness and tingling in her right jaw.  Patient states she has a history of TIA hypertension arthritis diabetes kidney disease.  Family states she had a TIA in the past but they did note some residual symptoms including facial droop and leg weakness.  Patient was not ever diagnosed as having a stroke.  Patient states this morning she had sudden onset of feeling diaphoretic.  She then started feeling weak all over.  She developed some tingling in the right jaw.  She denied any trouble with her speech.  No trouble with her vision.  She was wheeling weak all over but not anywhere focally.  Those symptoms lasted for about an hour they have all resolved at this point  Home Medications Prior to Admission medications   Medication Sig Start Date End Date Taking? Authorizing Provider  chlorthalidone (HYGROTON) 25 MG tablet Take 25 mg by mouth every morning. 08/05/22  Yes [provider]  amLODipine (NORVASC) 10 MG tablet Take 1 tablet (10 mg total) by mouth daily. 05/07/18   Elgergawy, Leana Roe, MD  aspirin 81 MG EC tablet Take 1 tablet (81 mg total) by mouth daily. 05/07/18   Elgergawy, Leana Roe, MD  atorvastatin (LIPITOR) 80 MG tablet Take 1 tablet (80 mg total) by mouth daily. 05/07/18   Elgergawy, Leana Roe, MD  BAYER CONTOUR NEXT TEST test strip  04/24/14   [provider]  cholecalciferol (VITAMIN D) 1000 units tablet Take 1,000 Units by mouth daily.     [provider]  metFORMIN (GLUCOPHAGE) 1000 MG tablet Take 1 tablet (1,000 mg total) by mouth daily with breakfast. Hold for 2 days  as you received IV contrast, and resume on 05/10/2018 05/10/18   Elgergawy, Leana Roe, MD  Multiple Vitamin (MULTIVITAMIN WITH MINERALS) TABS tablet Take 1 tablet by mouth daily. Centrum    [provider]  valsartan (DIOVAN) 320 MG tablet Take 1 tablet (320 mg total) by mouth daily. 05/07/18   Elgergawy, Leana Roe, MD      Allergies    Aspirin    Review of Systems   Review of Systems  Neurological:  Positive for dizziness and weakness.    Physical Exam Updated Vital Signs BP (!) 161/84   Pulse 81   Temp (!) 97.3 F (36.3 C) (Temporal)   Resp 12   Ht 1.676 m (5\' 6" )   Wt 77.1 kg   SpO2 96%   BMI 27.44 kg/m  Physical Exam Vitals and nursing note reviewed.  Constitutional:      General: She is not in acute distress.    Appearance: She is well-developed.  HENT:     Head: Normocephalic and atraumatic.     Right Ear: External ear normal.     Left Ear: External ear normal.  Eyes:     General: No visual field deficit or scleral icterus.       Right eye: No discharge.        Left eye: No discharge.     Conjunctiva/sclera: Conjunctivae normal.  Neck:     Trachea: No  tracheal deviation.  Cardiovascular:     Rate and Rhythm: Normal rate and regular rhythm.  Pulmonary:     Effort: Pulmonary effort is normal. No respiratory distress.     Breath sounds: Normal breath sounds. No stridor. No wheezing or rales.  Abdominal:     General: Bowel sounds are normal. There is no distension.     Palpations: Abdomen is soft.     Tenderness: There is no abdominal tenderness. There is no guarding or rebound.  Musculoskeletal:        General: No tenderness.     Cervical back: Neck supple.  Skin:    General: Skin is warm and dry.     Findings: No rash.  Neurological:     Mental Status: She is alert and oriented to person, place, and time.     Cranial Nerves: No cranial nerve deficit, dysarthria or facial asymmetry.     Sensory: No sensory deficit.     Motor: No abnormal muscle tone,  seizure activity or pronator drift.     Coordination: Coordination normal.     Comments:  able to hold both legs off bed for 5 seconds, sensation intact in all extremities,  no left or right sided neglect, normal finger-nose exam bilaterally, no nystagmus noted, slight facial asymmetry noted at baseline but equal movements with smiling forehead wrinkling   Psychiatric:        Mood and Affect: Mood normal.     ED Results / Procedures / Treatments   Labs (all labs ordered are listed, but only abnormal results are displayed) Labs Reviewed  CBC - Abnormal; Notable for the following components:      Result Value   RBC 5.39 (*)    HCT 46.4 (*)    All other components within normal limits  COMPREHENSIVE METABOLIC PANEL - Abnormal; Notable for the following components:   Potassium 3.4 (*)    Glucose, Bld 119 (*)    BUN 39 (*)    Creatinine, Ser 1.30 (*)    GFR, Estimated 40 (*)    All other components within normal limits  URINALYSIS, ROUTINE W REFLEX MICROSCOPIC - Abnormal; Notable for the following components:   Color, Urine COLORLESS (*)    All other components within normal limits  CBG MONITORING, ED - Abnormal; Notable for the following components:   Glucose-Capillary 104 (*)    All other components within normal limits  DIFFERENTIAL    EKG EKG Interpretation Date/Time:  Thursday November 12 2022 07:41:55 EDT Ventricular Rate:  78 PR Interval:  190 QRS Duration:  77 QT Interval:  401 QTC Calculation: 457 R Axis:   -34  Text Interpretation: Sinus rhythm Left axis deviation Low voltage, precordial leads Borderline T wave abnormalities , new since last tracing Confirmed by Linwood Dibbles [phone]) on 11/12/2022 7:58:27 AM  Radiology MR BRAIN WO CONTRAST  Result Date: 11/12/2022 CLINICAL DATA:  Neuro deficit, acute, stroke suspected EXAM: MRI HEAD WITHOUT CONTRAST TECHNIQUE: Multiplanar, multiecho pulse sequences of the brain and surrounding structures were obtained without  intravenous contrast. COMPARISON:  None Available. FINDINGS: Brain: Punctate acute infarct in the right temporal lobe (series 2, image 76). No hemorrhage. No hydrocephalus. No extra-axial fluid collection. No mass effect. No mass lesion. There is chronic infarct in the left parietal lobe. There is sequela of severe chronic microvascular ischemic change. Chronic infarcts in the right basal ganglia, central pons, genu of the corpus callosum, and in the right cerebellum. Generalized volume loss without lobar predominance. Vascular:  Normal flow voids. Skull and upper cervical spine: Normal marrow signal. Sinuses/Orbits: Middle ear or mastoid effusion. Paranasal sinuses are notable for mucosal thickening in the right maxillary sinus. Bilateral lens replacement. Orbits are otherwise unremarkable. Other: None. IMPRESSION: 1. Punctate acute infarct in the right temporal lobe. 2. Sequela of severe chronic microvascular ischemic change with multiple chronic infarcts, as above. Electronically Signed   By: Lorenza Cambridge M.D.   On: 11/12/2022 12:47   CT HEAD WO CONTRAST  Result Date: 11/12/2022 CLINICAL DATA:  Neuro deficit with acute stroke suspected. Dizziness and sweating with tingling in the right jaw. Weakness that started this morning EXAM: CT HEAD WITHOUT CONTRAST TECHNIQUE: Contiguous axial images were obtained from the base of the skull through the vertex without intravenous contrast. RADIATION DOSE REDUCTION: This exam was performed according to the departmental dose-optimization program which includes automated exposure control, adjustment of the mA and/or kV according to patient size and/or use of iterative reconstruction technique. COMPARISON:  08/07/2021 FINDINGS: Brain: No evidence of acute infarction, hemorrhage, hydrocephalus, extra-axial collection or mass lesion/mass effect. Extensive chronic small vessel ischemic low-density in the cerebral white matter and deep gray nuclei with chronic lacunar infarcts  at the basal ganglia, bilateral thalamus, and left para median pons. Chronic left frontal parietal infarct centered at the cortex. Mild cerebral volume loss. Vascular: No hyperdense vessel or unexpected calcification. Skull: Normal. Negative for fracture or focal lesion. Sinuses/Orbits: No acute finding. IMPRESSION: 1. No acute or interval finding. 2. Advanced chronic ischemic injury. Electronically Signed   By: Tiburcio Pea M.D.   On: 11/12/2022 09:13    Procedures Procedures    Medications Ordered in ED Medications  sodium chloride 0.9 % bolus 500 mL (0 mLs Intravenous Stopped 11/12/22 1000)    ED Course/ Medical Decision Making/ A&P Clinical Course as of 11/12/22 1332  Thu Nov 12, 2022  0829 CBC and normal [JK]  0924 Head CT without acute abnormality [JK]  0981 Discussed findings with patient and family.  Will proceed with MRI for further evaluation. [JK]  1021 Urinalysis, Routine w reflex microscopic -Urine, Clean Catch(!) Normal [JK]  1251 Case was discussed with radiology.  Patient has a small infarct right temporal lobe [JK]  1332 Case discussed with Dr Katrinka Blazing [JK]    Clinical Course User Index [JK] Linwood Dibbles, MD                                 Medical Decision Making Problems Addressed: Cerebrovascular accident (CVA), unspecified mechanism [facility]): acute illness or injury that poses a threat to life or bodily functions  Amount and/or Complexity of Data Reviewed Labs: ordered. Decision-making details documented in ED Course. Radiology: ordered and independent interpretation performed.  Risk Prescription drug management. Decision regarding hospitalization.   Patient presented to the freestanding ED for evaluation of an acute onset of diaphoresis weakness numbness.  Patient felt that her symptoms had mostly resolved by the time she came to the ED.  Code stroke not activated based on the resolution of symptoms.  No signs of anemia or severe electrolyte abnormality noted on  her laboratory testing.  Head CT showed chronic findings but nothing acute.  With her symptoms I was concerned about the possibly of stroke TIA so MRI was performed.  MRI does show an acute punctate stroke.  No indication for TNK, acute intervention based on symptoms having resolved. Will not acutely tx her htn with her stroke.  Will plan on admission to [facility] for further evaluation and treatment.        Final Clinical Impression(s) / ED Diagnoses Final diagnoses:  Cerebrovascular accident (CVA), unspecified mechanism (HCC)  Hypertension, unspecified type    Rx / DC Orders ED Discharge Orders     None         Linwood Dibbles, MD 11/12/22 1332

## 2022-11-12 NOTE — H&P (Addendum)
[facility] History and Physical  Kathleen Figueroa ZOX:[id number] DOB: April 09, 1935 DOA: 11/12/2022  Referring physician: ED  PCP: [facility]   Patient is coming from:    Home  Chief Complaint: Tingling, numbness  HPI:   87 years old female with past medical history of diabetes mellitus type 2, hypertension, TIA and history of carotid stenosis status post endarterectomy on the left in 2015 presented to [facility] with dizziness and tingling over her right jaw.  In the past, patient did have a history of TIA but did not have a stroke.  On this presentation patient also had diaphoresis and generalized weakness without any speech difficulty or vision problems.  She also complained of a headache and some nausea which has improved.  The symptoms lasted for an hour or so and then improved.  Patient was then brought into the hospital.  Patient denies any urinary urgency, frequency or dysuria.  Denies any fever, chills or rigor.  Denies any syncope.  Denies any chest pain shortness of breath or dyspnea.  Has had bowel movement with okay appetite.  Felt nauseated but no vomiting.  In the ED, blood pressure was elevated at 200/97.  Lab data showed potassium low at 3.4 with a creatinine of 1.3.  Urinalysis was negative for infection.  EKG showed normal sinus rhythm with left axis deviation. MRI of the brain done in the ED punctuate acute infarct in the right temporal lobe with severe chronic microvascular ischemic changes.  Patient was then transferred to [facility] for  further stroke workup.  Assessment and Plan  Acute punctate infarct on the background of severe microvascular cerebral disease.   History of TIA now with stroke. Symptoms have improved.  Check 2D echocardiogram, stroke protocol, PT OT speech therapy evaluation.  Bedside swallow evaluation was okay so we will start the patient on oral diet.  Check hemoglobin A1c and lipid profile.  Check TSH.   Communicated with neurology for consultation to decide further on testing.  History of carotid stenosis status post left carotid endarterectomy 2015.  Was supposed to be on aspirin but was not taking it.  Counseled about it.  Essential hypertension Will allow permissive hypertension at this time.  Patient is on chlorthalidone  valsartan and amlodipine at home.  Will resume amlodipine.  Hyperlipidemia On Lipitor 80 mg at home.  Will continue.  Check lipid panel in AM.  Mild hypokalemia.  Will replenish orally.  Check levels in AM.  Generalized weakness and dizziness.  Will get PT OT evaluation.  Symptoms have however improved at this time.  Patient states she is otherwise independent and lives with her family  at home.  DVT Prophylaxis:lovenox sub q   Review of Systems:  All systems were reviewed and were negative unless otherwise mentioned in the HPI   Past Medical History:  Diagnosis Date   Acute renal failure (ARF) (HCC) 05/06/2018   Arthritis    Diabetes mellitus type 2, uncontrolled, 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   H/O detached retina repair    Hypertension    TIA (transient ischemic attack)    Past Surgical History:  Procedure Laterality Date   ENDARTERECTOMY Left 01/19/2014   Procedure: ENDARTERECTOMY CAROTID-LEFT;  Surgeon: Nada Libman, MD;  Location: [facility] OR;  Service: Vascular;  Laterality: Left;   EYE SURGERY     lens removed from right eye   PATCH ANGIOPLASTY Left 01/19/2014   Procedure: PATCH ANGIOPLASTY, LEFT CAROTID ARTERY USING HEMASHIELD PLATINUM FINESSE PATCH;  Surgeon: Nada Libman, MD;  Location: [facility] OR;  Service: Vascular;  Laterality: Left;   TONSILLECTOMY      Social History:  reports that she quit smoking about 44 years ago. Her smoking use included cigarettes. She has never used smokeless tobacco. She reports that she does not drink alcohol and does not use drugs.  Allergies  Allergen Reactions   Aspirin Nausea Only    Higher dose    Family  History  Problem Relation Age of Onset   Hypertension Mother    Diabetes Mother    Hypertension Father      Prior to Admission medications   Medication Sig Start Date End Date Taking? Authorizing Provider  amLODipine (NORVASC) 10 MG tablet Take 1 tablet (10 mg total) by mouth daily. 05/07/18  Yes Elgergawy, Leana Roe, MD  atorvastatin (LIPITOR) 80 MG tablet Take 1 tablet (80 mg total) by mouth daily. 05/07/18  Yes Elgergawy, Leana Roe, MD  chlorthalidone (HYGROTON) 25 MG tablet Take 25 mg by mouth every morning. 08/05/22  Yes [provider]  Multiple Vitamin (MULTIVITAMIN WITH MINERALS) TABS tablet Take 1 tablet by mouth daily. Centrum   Yes [provider]  valsartan (DIOVAN) 320 MG tablet Take 1 tablet (320 mg total) by mouth daily. 05/07/18  Yes Elgergawy, Leana Roe, MD  aspirin 81 MG EC tablet Take 1 tablet (81 mg total) by mouth daily. Patient not taking: Reported on 11/12/2022 05/07/18   Elgergawy, Leana Roe, MD  BAYER CONTOUR NEXT TEST test strip  04/24/14   [provider]  metFORMIN (GLUCOPHAGE) 1000 MG tablet Take 1 tablet (1,000 mg total) by mouth daily with breakfast. Hold for 2 days as you received IV contrast, and resume on 05/10/2018 Patient not taking: Reported on 11/12/2022 05/10/18   Elgergawy, Leana Roe, MD    Physical Exam: Vitals:   11/12/22 1300 11/12/22 1351 11/12/22 1520 11/12/22 1600  BP:  (!) 192/100 (!) 223/100 (!) 187/81  Pulse:  87 89 78  Resp:   18   Temp: 97.6 F (36.4 C)  97.7 F (36.5 C) 97.7 F (36.5 C)  TempSrc: Oral  Oral Oral  SpO2:  99% 93% 94%  Weight:      Height:       Wt Readings from Last 3 Encounters:  11/12/22 77.1 kg  05/07/18 81.8 kg  10/13/16 70.8 kg   Body mass index is 27.44 kg/m.  General:  Average built, not in obvious distress, elderly female, hard of hearing HENT: Normocephalic, No scleral pallor or icterus noted. Oral mucosa is moist.  Chest:  Clear breath sounds.  . No crackles or wheezes.  CVS: S1 &S2  heard. No murmur.  Regular rate and rhythm. Abdomen: Soft, nontender, nondistended.  Bowel sounds are heard. No abdominal mass palpated Extremities: No cyanosis, clubbing or edema.  Peripheral pulses are palpable. Psych: Alert, awake and oriented, normal mood CNS:  No cranial nerve deficits.  Power equal in all extremities.   Skin: Warm and dry.  No rashes noted.  Labs on Admission:   CBC: Recent Labs  Lab 11/12/22 0804  WBC 6.0  NEUTROABS 4.1  HGB 15.0  HCT 46.4*  MCV 86.1  PLT 170    Basic Metabolic Panel: Recent Labs  Lab 11/12/22 0804  NA 140  K 3.4*  CL 98  CO2 32  GLUCOSE 119*  BUN 39*  CREATININE 1.30*  CALCIUM 10.0    Liver Function Tests: Recent Labs  Lab 11/12/22 0804  AST  20  ALT 12  ALKPHOS 68  BILITOT 0.4  PROT 8.1  ALBUMIN 4.6   No results for input(s): "LIPASE", "AMYLASE" in the last 168 hours. No results for input(s): "AMMONIA" in the last 168 hours.  Cardiac Enzymes: No results for input(s): "CKTOTAL", "CKMB", "CKMBINDEX", "TROPONINI" in the last 168 hours.  BNP (last 3 results) No results for input(s): "BNP" in the last 8760 hours.  ProBNP (last 3 results) No results for input(s): "PROBNP" in the last 8760 hours.  CBG: Recent Labs  Lab 11/12/22 0744 11/12/22 1521  GLUCAP 104* 135*    Lipase  No results found for: "LIPASE"   Urinalysis    Component Value Date/Time   COLORURINE COLORLESS (A) 11/12/2022 0804   APPEARANCEUR CLEAR 11/12/2022 0804   LABSPEC 1.015 11/12/2022 0804   PHURINE 7.0 11/12/2022 0804   GLUCOSEU NEGATIVE 11/12/2022 0804   HGBUR NEGATIVE 11/12/2022 0804   BILIRUBINUR NEGATIVE 11/12/2022 0804   KETONESUR NEGATIVE 11/12/2022 0804   PROTEINUR NEGATIVE 11/12/2022 0804   UROBILINOGEN 0.2 01/18/2014 1350   NITRITE NEGATIVE 11/12/2022 0804   LEUKOCYTESUR NEGATIVE 11/12/2022 0804     Radiological Exams on Admission: MR BRAIN WO CONTRAST  Result Date: 11/12/2022 CLINICAL DATA:  Neuro deficit, acute,  stroke suspected EXAM: MRI HEAD WITHOUT CONTRAST TECHNIQUE: Multiplanar, multiecho pulse sequences of the brain and surrounding structures were obtained without intravenous contrast. COMPARISON:  None Available. FINDINGS: Brain: Punctate acute infarct in the right temporal lobe (series 2, image 76). No hemorrhage. No hydrocephalus. No extra-axial fluid collection. No mass effect. No mass lesion. There is chronic infarct in the left parietal lobe. There is sequela of severe chronic microvascular ischemic change. Chronic infarcts in the right basal ganglia, central pons, genu of the corpus callosum, and in the right cerebellum. Generalized volume loss without lobar predominance. Vascular: Normal flow voids. Skull and upper cervical spine: Normal marrow signal. Sinuses/Orbits: Middle ear or mastoid effusion. Paranasal sinuses are notable for mucosal thickening in the right maxillary sinus. Bilateral lens replacement. Orbits are otherwise unremarkable. Other: None. IMPRESSION: 1. Punctate acute infarct in the right temporal lobe. 2. Sequela of severe chronic microvascular ischemic change with multiple chronic infarcts, as above. Electronically Signed   By: Lorenza Cambridge M.D.   On: 11/12/2022 12:47   CT HEAD WO CONTRAST  Result Date: 11/12/2022 CLINICAL DATA:  Neuro deficit with acute stroke suspected. Dizziness and sweating with tingling in the right jaw. Weakness that started this morning EXAM: CT HEAD WITHOUT CONTRAST TECHNIQUE: Contiguous axial images were obtained from the base of the skull through the vertex without intravenous contrast. RADIATION DOSE REDUCTION: This exam was performed according to the departmental dose-optimization program which includes automated exposure control, adjustment of the mA and/or kV according to patient size and/or use of iterative reconstruction technique. COMPARISON:  08/07/2021 FINDINGS: Brain: No evidence of acute infarction, hemorrhage, hydrocephalus, extra-axial collection  or mass lesion/mass effect. Extensive chronic small vessel ischemic low-density in the cerebral white matter and deep gray nuclei with chronic lacunar infarcts at the basal ganglia, bilateral thalamus, and left para median pons. Chronic left frontal parietal infarct centered at the cortex. Mild cerebral volume loss. Vascular: No hyperdense vessel or unexpected calcification. Skull: Normal. Negative for fracture or focal lesion. Sinuses/Orbits: No acute finding. IMPRESSION: 1. No acute or interval finding. 2. Advanced chronic ischemic injury. Electronically Signed   By: Tiburcio Pea M.D.   On: 11/12/2022 09:13    EKG: Personally reviewed by me which shows normal sinus rhythm.  Consultant: Will consult neurology  Code Status: Full code  Microbiology none  Antibiotics: None  Family Communication:   Patients' condition and plan of care including tests being ordered have been discussed with the patient  who indicate understanding and agree with the plan.  Tried to call the patient's daughter but was unable to reach her so spoke with son on the phone and updated him.    Status is: Observation The patient remains OBS appropriate and will d/c before 2 midnights.   Severity of Illness: The appropriate patient status for this patient is OBSERVATION. Observation status is judged to be reasonable and necessary in order to provide the required intensity of service to ensure the patient's safety. The patient's presenting symptoms, physical exam findings, and initial radiographic and laboratory data in the context of their medical condition is felt to place them at decreased risk for further clinical deterioration. Furthermore, it is anticipated that the patient will be medically stable for discharge from the hospital within 2 midnights of admission.   Signed, Joycelyn Das, MD [facility] 11/12/2022

## 2022-11-12 NOTE — Progress Notes (Signed)
[facility] night cross cover note:   I was notified by RN of the patient's family's request for evaluation of both COVID and influenza in the setting of the patient's cough.  I subsequently placed orders for testing for COVID as well as order for respiratory viral panel to assess for influenza, per patient's family's request.    Newton Pigg, DO Hospitalist

## 2022-11-12 NOTE — Progress Notes (Addendum)
[facility] night cross cover note:   I was notified by RN that the patient is experiencing some nausea/vomiting that is refractory to most recent dose of Zofran.  I subsequently added prn phenergan for refractory nausea/vomiting.     Update: The patient vomited shortly after receiving her potassium chloride 40 mill equivalents p.o. in the setting of hypokalemia with serum potassium level of 3.4 this morning.  As a result, I have I subsequently placed order for potassium chloride 40 meq iv over 4 hours and noted the BMP ordered with tomorrow morning's labs for further trending in serum potassium level.    Newton Pigg, DO Hospitalist

## 2022-11-12 NOTE — Progress Notes (Signed)
Attempted Echocardiogram, patient care in progress

## 2022-11-12 NOTE — Plan of Care (Signed)
See [facility] communication for acceptance information.

## 2022-11-13 ENCOUNTER — Observation Stay (HOSPITAL_COMMUNITY): Payer: Medicare HMO

## 2022-11-13 ENCOUNTER — Observation Stay (HOSPITAL_BASED_OUTPATIENT_CLINIC_OR_DEPARTMENT_OTHER): Payer: Medicare HMO

## 2022-11-13 DIAGNOSIS — E782 Mixed hyperlipidemia: Secondary | ICD-10-CM

## 2022-11-13 DIAGNOSIS — I63019 Cerebral infarction due to thrombosis of unspecified vertebral artery: Secondary | ICD-10-CM

## 2022-11-13 DIAGNOSIS — Z8673 Personal history of transient ischemic attack (TIA), and cerebral infarction without residual deficits: Secondary | ICD-10-CM | POA: Diagnosis not present

## 2022-11-13 DIAGNOSIS — I672 Cerebral atherosclerosis: Secondary | ICD-10-CM | POA: Diagnosis not present

## 2022-11-13 DIAGNOSIS — I63311 Cerebral infarction due to thrombosis of right middle cerebral artery: Secondary | ICD-10-CM

## 2022-11-13 DIAGNOSIS — I6389 Other cerebral infarction: Secondary | ICD-10-CM

## 2022-11-13 DIAGNOSIS — I6503 Occlusion and stenosis of bilateral vertebral arteries: Secondary | ICD-10-CM | POA: Diagnosis not present

## 2022-11-13 DIAGNOSIS — E119 Type 2 diabetes mellitus without complications: Secondary | ICD-10-CM | POA: Diagnosis not present

## 2022-11-13 DIAGNOSIS — I10 Essential (primary) hypertension: Secondary | ICD-10-CM | POA: Diagnosis not present

## 2022-11-13 DIAGNOSIS — I6523 Occlusion and stenosis of bilateral carotid arteries: Secondary | ICD-10-CM | POA: Diagnosis not present

## 2022-11-13 LAB — RESPIRATORY PANEL BY PCR

## 2022-11-13 LAB — ECHOCARDIOGRAM COMPLETE
AR max vel: 1.48 cm2
AV Peak grad: 7.7 mm[Hg]
Ao pk vel: 1.39 m/s
Area-P 1/2: 2.99 cm2
Height: 66 in
S' Lateral: 2.2 cm
Weight: 2720 [oz_av]

## 2022-11-13 LAB — BASIC METABOLIC PANEL
Anion gap: 10 (ref 5–15)
BUN: 20 mg/dL (ref 8–23)
CO2: 30 mmol/L (ref 22–32)
Calcium: 9.2 mg/dL (ref 8.9–10.3)
Chloride: 97 mmol/L — ABNORMAL LOW (ref 98–111)
Creatinine, Ser: 1.2 mg/dL — ABNORMAL HIGH (ref 0.44–1.00)
GFR, Estimated: 44 mL/min — ABNORMAL LOW (ref >60)
Glucose, Bld: 124 mg/dL — ABNORMAL HIGH (ref 70–99)
Potassium: 4.9 mmol/L (ref 3.5–5.1)
Sodium: 137 mmol/L (ref 135–145)

## 2022-11-13 LAB — CBC
HCT: 48.4 % — ABNORMAL HIGH (ref 36.0–46.0)
Hemoglobin: 15.3 g/dL — ABNORMAL HIGH (ref 12.0–15.0)
MCH: 27.9 pg (ref 26.0–34.0)
MCHC: 31.6 g/dL (ref 30.0–36.0)
MCV: 88.3 fL (ref 80.0–100.0)
Platelets: 188 10*3/uL (ref 150–400)
RBC: 5.48 MIL/uL — ABNORMAL HIGH (ref 3.87–5.11)
RDW: 14.1 % (ref 11.5–15.5)
WBC: 8.3 10*3/uL (ref 4.0–10.5)
nRBC: 0 % (ref 0.0–0.2)

## 2022-11-13 LAB — GLUCOSE, CAPILLARY
Glucose-Capillary: 131 mg/dL — ABNORMAL HIGH (ref 70–99)
Glucose-Capillary: 146 mg/dL — ABNORMAL HIGH (ref 70–99)

## 2022-11-13 LAB — HEMOGLOBIN A1C
Hgb A1c MFr Bld: 6.5 % — ABNORMAL HIGH (ref 4.8–5.6)
Mean Plasma Glucose: 139.85 mg/dL

## 2022-11-13 LAB — LIPID PANEL
Cholesterol: 227 mg/dL — ABNORMAL HIGH (ref 0–200)
HDL: 71 mg/dL (ref >40)
LDL Cholesterol: 145 mg/dL — ABNORMAL HIGH (ref 0–99)
Total CHOL/HDL Ratio: 3.2 {ratio}
Triglycerides: 54 mg/dL (ref <150)
VLDL: 11 mg/dL (ref 0–40)

## 2022-11-13 LAB — SARS CORONAVIRUS 2 BY RT PCR: SARS Coronavirus 2 by RT PCR: NEGATIVE

## 2022-11-13 LAB — MAGNESIUM: Magnesium: 1.8 mg/dL (ref 1.7–2.4)

## 2022-11-13 LAB — TSH: TSH: 1.006 u[IU]/mL (ref 0.350–4.500)

## 2022-11-13 LAB — C-REACTIVE PROTEIN: CRP: 0.5 mg/dL (ref <1.0)

## 2022-11-13 LAB — SEDIMENTATION RATE: Sed Rate: 4 mm/h (ref 0–22)

## 2022-11-13 MED ORDER — PROCHLORPERAZINE EDISYLATE 10 MG/2ML IJ SOLN: 10.0000 mg | Freq: Four times a day (QID) | INTRAMUSCULAR | Status: DC | PRN

## 2022-11-13 MED ORDER — INSULIN ASPART 100 UNIT/ML IJ SOLN: 0.0000 [IU] | Freq: Three times a day (TID) | INTRAMUSCULAR | Status: DC

## 2022-11-13 MED ORDER — CHLORTHALIDONE 25 MG PO TABS: 25.0000 mg | ORAL_TABLET | Freq: Every morning | ORAL | 0 refills | Status: DC

## 2022-11-13 MED ORDER — AMLODIPINE BESYLATE 10 MG PO TABS: 10.0000 mg | ORAL_TABLET | Freq: Every day | ORAL | 0 refills | Status: AC

## 2022-11-13 MED ORDER — ASPIRIN 81 MG PO TBEC: 81.0000 mg | DELAYED_RELEASE_TABLET | Freq: Every day | ORAL | 0 refills | Status: DC

## 2022-11-13 MED ORDER — CLOPIDOGREL BISULFATE 75 MG PO TABS: 75.0000 mg | ORAL_TABLET | Freq: Every day | ORAL | 1 refills | Status: DC

## 2022-11-13 MED ORDER — ADULT MULTIVITAMIN W/MINERALS CH: 1.0000 | ORAL_TABLET | Freq: Every day | ORAL | 0 refills | Status: DC

## 2022-11-13 MED ORDER — ATORVASTATIN CALCIUM 80 MG PO TABS: 80.0000 mg | ORAL_TABLET | Freq: Every day | ORAL | 0 refills | Status: DC

## 2022-11-13 MED ORDER — EZETIMIBE 10 MG PO TABS
10.0000 mg | ORAL_TABLET | Freq: Every day | ORAL | Status: DC
  Administered 2022-11-13: 10 mg via ORAL
  Filled 2022-11-13: qty 1

## 2022-11-13 MED ORDER — CLOPIDOGREL BISULFATE 75 MG PO TABS
75.0000 mg | ORAL_TABLET | Freq: Every day | ORAL | Status: DC
  Administered 2022-11-13: 75 mg via ORAL
  Filled 2022-11-13: qty 1

## 2022-11-13 MED ORDER — IOHEXOL 350 MG/ML SOLN
75.0000 mL | Freq: Once | INTRAVENOUS | Status: AC | PRN
  Administered 2022-11-13: 75 mL via INTRAVENOUS

## 2022-11-13 MED ORDER — METFORMIN HCL 1000 MG PO TABS: 1000.0000 mg | ORAL_TABLET | Freq: Every day | ORAL | 0 refills | Status: DC

## 2022-11-13 MED ORDER — VALSARTAN 320 MG PO TABS: 320.0000 mg | ORAL_TABLET | Freq: Every day | ORAL | 0 refills | Status: DC

## 2022-11-13 NOTE — Progress Notes (Signed)
STROKE TEAM PROGRESS NOTE   SUBJECTIVE (INTERVAL HISTORY) No family is at the bedside.  Overall her condition is completely resolved.  Patient had early morning headache, sweating, and nausea, treated with Zofran and symptoms resolved.  Currently reclining in bed, no headache, no complaints, fully orientated.  But still has mild right upper extremity ataxia which she does not know was new or chronic.   OBJECTIVE Temp:  [95.9 F (35.5 C)-98.6 F (37 C)] 98.4 F (36.9 C) (10/04 1600) Pulse Rate:  [57-86] 80 (10/04 1600) Cardiac Rhythm: Normal sinus rhythm (10/04 0700) Resp:  [12-24] 15 (10/04 1600) BP: (112-157)/(48-146) 112/48 (10/04 1600) SpO2:  [85 %-100 %] 92 % (10/04 1600)  Recent Labs  Lab 11/12/22 0744 11/12/22 1521 11/13/22 1246 11/13/22 1711  GLUCAP 104* 135* 131* 146*   Recent Labs  Lab 11/12/22 0804 11/13/22 0539  NA 140 137  K 3.4* 4.9  CL 98 97*  CO2 32 30  GLUCOSE 119* 124*  BUN 39* 20  CREATININE 1.30* 1.20*  CALCIUM 10.0 9.2  MG  --  1.8   Recent Labs  Lab 11/12/22 0804  AST 20  ALT 12  ALKPHOS 68  BILITOT 0.4  PROT 8.1  ALBUMIN 4.6   Recent Labs  Lab 11/12/22 0804 11/13/22 0539  WBC 6.0 8.3  NEUTROABS 4.1  --   HGB 15.0 15.3*  HCT 46.4* 48.4*  MCV 86.1 88.3  PLT 170 188   No results for input(s): "CKTOTAL", "CKMB", "CKMBINDEX", "TROPONINI" in the last 168 hours. No results for input(s): "LABPROT", "INR" in the last 72 hours. Recent Labs    11/12/22 0804  COLORURINE COLORLESS*  LABSPEC 1.015  PHURINE 7.0  GLUCOSEU NEGATIVE  HGBUR NEGATIVE  BILIRUBINUR NEGATIVE  KETONESUR NEGATIVE  PROTEINUR NEGATIVE  NITRITE NEGATIVE  LEUKOCYTESUR NEGATIVE       Component Value Date/Time   CHOL 227 (H) 11/13/2022 0539   TRIG 54 11/13/2022 0539   HDL 71 11/13/2022 0539   CHOLHDL 3.2 11/13/2022 0539   VLDL 11 11/13/2022 0539   LDLCALC 145 (H) 11/13/2022 0539   Lab Results  Component Value Date   HGBA1C 6.5 (H) 11/13/2022       Component Value Date/Time   LABOPIA NONE DETECTED 01/12/2014 1231   COCAINSCRNUR NONE DETECTED 01/12/2014 1231   LABBENZ NONE DETECTED 01/12/2014 1231   AMPHETMU NONE DETECTED 01/12/2014 1231   THCU NONE DETECTED 01/12/2014 1231   LABBARB NONE DETECTED 01/12/2014 1231    No results for input(s): "ETH" in the last 168 hours.  I have personally reviewed the radiological images below and agree with the radiology interpretations.  ECHOCARDIOGRAM COMPLETE  Result Date: 11/13/2022    ECHOCARDIOGRAM REPORT   Patient Name:   Kathleen Figueroa Date of Exam: 11/13/2022 Medical Rec #:  [id number]      Height:       66.0 in Accession #:    [id number]     Weight:       170.0 lb Date of Birth:  07/26/1935      BSA:          1.866 m Patient Age:    87 years       BP:           138/66 mmHg Patient Gender: F              HR:           67 bpm. Exam Location:  Inpatient Procedure: 2D Echo, Cardiac Doppler and Color  Doppler Indications:    Stroke I63.9  History:        Patient has prior history of Echocardiogram examinations, most                 recent 05/07/2018. CAD, Stroke and TIA; Risk                 Factors:Hypertension, Diabetes and Dyslipidemia.  Sonographer:    Lucendia Herrlich RCS Referring Phys: [phone] [facility] [place] IMPRESSIONS  1. Left ventricular ejection fraction, by estimation, is 60 to 65%. The left ventricle has normal function. The left ventricle has no regional wall motion abnormalities. There is mild concentric left ventricular hypertrophy. Left ventricular diastolic parameters are consistent with Grade I diastolic dysfunction (impaired relaxation).  2. Right ventricular systolic function is normal. The right ventricular size is normal.  3. The mitral valve is normal in structure. No evidence of mitral valve regurgitation. No evidence of mitral stenosis.  4. The aortic valve is normal in structure. Aortic valve regurgitation is not visualized. No aortic stenosis is present.  5. Abdominal aorta is  normal.  6. The inferior vena cava is normal in size with greater than 50% respiratory variability, suggesting right atrial pressure of 3 mmHg. FINDINGS  Left Ventricle: Left ventricular ejection fraction, by estimation, is 60 to 65%. The left ventricle has normal function. The left ventricle has no regional wall motion abnormalities. The left ventricular internal cavity size was normal in size. There is  mild concentric left ventricular hypertrophy. Left ventricular diastolic parameters are consistent with Grade I diastolic dysfunction (impaired relaxation). Right Ventricle: The right ventricular size is normal. No increase in right ventricular wall thickness. Right ventricular systolic function is normal. Left Atrium: Left atrial size was normal in size. Right Atrium: Right atrial size was normal in size. Pericardium: There is no evidence of pericardial effusion. Mitral Valve: The mitral valve is normal in structure. Mild mitral annular calcification. No evidence of mitral valve regurgitation. No evidence of mitral valve stenosis. Tricuspid Valve: The tricuspid valve is normal in structure. Tricuspid valve regurgitation is not demonstrated. No evidence of tricuspid stenosis. Aortic Valve: The aortic valve is normal in structure. Aortic valve regurgitation is not visualized. No aortic stenosis is present. Aortic valve peak gradient measures 7.7 mmHg. Pulmonic Valve: The pulmonic valve was normal in structure. Pulmonic valve regurgitation is not visualized. No evidence of pulmonic stenosis. Aorta: Abdominal aorta is normal. The aortic root is normal in size and structure. Venous: The inferior vena cava is normal in size with greater than 50% respiratory variability, suggesting right atrial pressure of 3 mmHg. IAS/Shunts: No atrial level shunt detected by color flow Doppler.  LEFT VENTRICLE PLAX 2D LVIDd:         3.20 cm   Diastology LVIDs:         2.20 cm   LV e' medial:    4.29 cm/s LV PW:         1.20 cm   LV E/e'  medial:  13.8 LV IVS:        1.20 cm   LV e' lateral:   8.15 cm/s LVOT diam:     1.90 cm   LV E/e' lateral: 7.3 LV SV:         40 LV SV Index:   22 LVOT Area:     2.84 cm  RIGHT VENTRICLE             IVC RV S prime:     13.30  cm/s  IVC diam: 1.30 cm TAPSE (M-mode): 2.5 cm LEFT ATRIUM             Index        RIGHT ATRIUM           Index LA diam:        3.50 cm 1.88 cm/m   RA Area:     10.90 cm LA Vol (A2C):   37.7 ml 20.20 ml/m  RA Volume:   22.60 ml  12.11 ml/m LA Vol (A4C):   32.0 ml 17.15 ml/m LA Biplane Vol: 35.9 ml 19.24 ml/m  AORTIC VALVE AV Area (Vmax): 1.48 cm AV Vmax:        139.00 cm/s AV Peak Grad:   7.7 mmHg LVOT Vmax:      72.72 cm/s LVOT Vmean:     46.400 cm/s LVOT VTI:       0.142 m  AORTA Ao Root diam: 3.00 cm Ao Asc diam:  2.60 cm MITRAL VALVE               TRICUSPID VALVE MV Area (PHT): 2.99 cm    TR Peak grad:   20.4 mmHg MV Decel Time: 254 msec    TR Vmax:        226.00 cm/s MV E velocity: 59.10 cm/s MV A velocity: 88.70 cm/s  SHUNTS MV E/A ratio:  0.67        Systemic VTI:  0.14 m                            Systemic Diam: 1.90 cm Kardie Tobb DO Electronically signed by Thomasene Ripple DO Signature Date/Time: 11/13/2022/3:00:28 PM    Final    CT ANGIO HEAD NECK W WO CM  Result Date: 11/13/2022 CLINICAL DATA:  Stroke/TIA EXAM: CT ANGIOGRAPHY HEAD AND NECK WITH AND WITHOUT CONTRAST TECHNIQUE: Multidetector CT imaging of the head and neck was performed using the standard protocol during bolus administration of intravenous contrast. Multiplanar CT image reconstructions and MIPs were obtained to evaluate the vascular anatomy. Carotid stenosis measurements (when applicable) are obtained utilizing NASCET criteria, using the distal internal carotid diameter as the denominator. RADIATION DOSE REDUCTION: This exam was performed according to the departmental dose-optimization program which includes automated exposure control, adjustment of the mA and/or kV according to patient size and/or use of  iterative reconstruction technique. CONTRAST:  75mL OMNIPAQUE IOHEXOL 350 MG/ML SOLN COMPARISON:  CT head and brain MRI 1 day prior FINDINGS: CT HEAD FINDINGS Brain: The punctate infarct in the right temporal lobe seen on the prior brain MRI is not seen on the current study. There is no evidence of new acute intracranial hemorrhage, extra-axial fluid collection, or acute territorial infarct. Background parenchymal volume is stable. The ventricles are stable in size. Multiple remote infarcts most notably in the left parietal and occipital cortex are unchanged. The pituitary and suprasellar region are normal. There is no mass lesion. There is no mass effect or midline shift. Vascular: See below. Skull: Normal. Negative for fracture or focal lesion. Sinuses/Orbits: The paranasal sinuses are clear. Bilateral lens implants and a right scleral buckle are noted. Other: The mastoid air cells and middle ear cavities are clear. Review of the MIP images confirms the above findings CTA NECK FINDINGS Aortic arch: There is mild calcified plaque in the imaged aortic arch. The origins of the major branch vessels are patent. The subclavian arteries are patent to the level imaged. Right  carotid system: The right common carotid artery is patent. There is moderate plaque at the carotid bifurcation without hemodynamically significant stenosis or occlusion. The distal internal carotid artery is patent but tortuous. The external carotid artery is patent. There is no evidence of dissection or aneurysm. Left carotid system: The left common, internal, and external carotid arteries are patent, without significant stenosis or occlusion. There is mild tortuosity of the high cervical internal carotid artery. There is no evidence of dissection or aneurysm. Vertebral arteries: There is severe left and mild right vertebral artery origin stenosis. There is scattered mild plaque throughout the remainder of the vertebral arteries without other  significant stenosis or occlusion there is no evidence of dissection or aneurysm. Skeleton: There is no acute osseous abnormality or suspicious osseous lesion. There is no visible canal hematoma. 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 Other neck: The soft tissues of the neck are unremarkable. Upper chest: There is emphysema in the lung apices. Review of the MIP images confirms the above findings CTA HEAD FINDINGS Anterior circulation: There is calcified plaque in the intracranial ICAs resulting in up to moderate stenosis on the left and mild stenosis on the right. The bilateral MCAs are patent, without proximal stenosis or occlusion the left A1 segment is diminutive, likely congenital. Otherwise, the ACAS are patent, without proximal stenosis or occlusion. The anterior communicating artery is normal. There is no aneurysm or AVM. Posterior circulation: There is focal severe stenosis of the right V4 segment (12-148). The left V4 segment is patent. The basilar artery is patent. The major cerebellar arteries appear patent. The PCAs are patent, with mild atherosclerotic irregularity and narrowing on the right but no proximal high-grade stenosis or occlusion. A right posterior communicating artery is identified. There is no aneurysm or AVM. Venous sinuses: Not well evaluated due to bolus timing. Anatomic variants: None. Review of the MIP images confirms the above findings IMPRESSION: 1. The punctate infarct in the right temporal lobe seen on the prior brain MRI is not seen on the current study. No new acute intracranial pathology. 2. Intracranial atherosclerotic disease resulting in moderate left and mild right intracranial ICA stenosis and severe right V4 stenosis. 3. Severe left vertebral artery origin stenosis and moderate plaque at the right carotid bifurcation without significant stenosis or occlusion. 4. Emphysema Electronically  Signed   By: Lesia Hausen M.D.   On: 11/13/2022 14:54   MR BRAIN WO CONTRAST  Result Date: 11/12/2022 CLINICAL DATA:  Neuro deficit, acute, stroke suspected EXAM: MRI HEAD WITHOUT CONTRAST TECHNIQUE: Multiplanar, multiecho pulse sequences of the brain and surrounding structures were obtained without intravenous contrast. COMPARISON:  None Available. FINDINGS: Brain: Punctate acute infarct in the right temporal lobe (series 2, image 76). No hemorrhage. No hydrocephalus. No extra-axial fluid collection. No mass effect. No mass lesion. There is chronic infarct in the left parietal lobe. There is sequela of severe chronic microvascular ischemic change. Chronic infarcts in the right basal ganglia, central pons, genu of the corpus callosum, and in the right cerebellum. Generalized volume loss without lobar predominance. Vascular: Normal flow voids. Skull and upper cervical spine: Normal marrow signal. Sinuses/Orbits: Middle ear or mastoid effusion. Paranasal sinuses are notable for mucosal thickening in the right maxillary sinus. Bilateral lens replacement. Orbits are otherwise unremarkable. Other: None. IMPRESSION: 1. Punctate acute infarct in the right temporal lobe. 2. Sequela of severe chronic microvascular ischemic change with multiple chronic infarcts, as above. Electronically Signed  By: Lorenza Cambridge M.D.   On: 11/12/2022 12:47   CT HEAD WO CONTRAST  Result Date: 11/12/2022 CLINICAL DATA:  Neuro deficit with acute stroke suspected. Dizziness and sweating with tingling in the right jaw. Weakness that started this morning EXAM: CT HEAD WITHOUT CONTRAST TECHNIQUE: Contiguous axial images were obtained from the base of the skull through the vertex without intravenous contrast. RADIATION DOSE REDUCTION: This exam was performed according to the departmental dose-optimization program which includes automated exposure control, adjustment of the mA and/or kV according to patient size and/or use of iterative  reconstruction technique. COMPARISON:  08/07/2021 FINDINGS: Brain: No evidence of acute infarction, hemorrhage, hydrocephalus, extra-axial collection or mass lesion/mass effect. Extensive chronic small vessel ischemic low-density in the cerebral white matter and deep gray nuclei with chronic lacunar infarcts at the basal ganglia, bilateral thalamus, and left para median pons. Chronic left frontal parietal infarct centered at the cortex. Mild cerebral volume loss. Vascular: No hyperdense vessel or unexpected calcification. Skull: Normal. Negative for fracture or focal lesion. Sinuses/Orbits: No acute finding. IMPRESSION: 1. No acute or interval finding. 2. Advanced chronic ischemic injury. Electronically Signed   By: Tiburcio Pea M.D.   On: 11/12/2022 09:13     PHYSICAL EXAM  Temp:  [95.9 F (35.5 C)-98.6 F (37 C)] 98.4 F (36.9 C) (10/04 1600) Pulse Rate:  [57-86] 80 (10/04 1600) Resp:  [12-24] 15 (10/04 1600) BP: (112-157)/(48-146) 112/48 (10/04 1600) SpO2:  [85 %-100 %] 92 % (10/04 1600)  General - Well nourished, well developed, in no apparent distress.  Ophthalmologic - fundi not visualized due to noncooperation.  Cardiovascular - Regular rhythm and rate.  Mental Status -  Level of arousal and orientation to time, place, and person were intact. Language including expression, naming, repetition, comprehension was assessed and found intact. Fund of Knowledge was assessed and was intact.  Cranial Nerves II - XII - II - Visual field intact OU. III, IV, VI - Extraocular movements intact. V - Facial sensation intact bilaterally. VII - Facial movement intact bilaterally. VIII - Hearing & vestibular intact bilaterally. X - Palate elevates symmetrically. XI - Chin turning & shoulder shrug intact bilaterally XII - Tongue protrusion intact.  Motor Strength - The patient's strength was normal in all extremities and pronator drift was absent.  Bulk was normal and fasciculations were  absent.   Motor Tone - Muscle tone was assessed at the neck and appendages and was normal.  Reflexes - The patient's reflexes were symmetrical in all extremities and she had no pathological reflexes.  Sensory - Light touch, temperature/pinprick were assessed and were symmetrical.    Coordination - The patient had normal movements in the R hand and feet with no ataxia or dysmetria.  However right arm rigidity mild ataxia.  Tremor was absent.  Gait and Station - deferred.   ASSESSMENT/PLAN Ms. Denica Kaplon is a 87 y.o. female with history of hypertension, diabetes, status post left CEA, stroke admitted for dizziness, nausea vomiting, right facial numbness tingling with headache. No tPA given due to symptom resolved.    Hypertensive encephalopathy versus complicated headache Right MCA/PCA punctate infarcts, incidental finding - likely small vessel disease CT no acute abnormality CT head and neck moderate left and mild right ICA siphon stenosis, severe right V4 stenosis, severe left VA origin stenosis, right carotid bulb atherosclerosis. MRI punctate acute infarct in the right temporal lobe 2D Echo EF 60 to 65% LDL 145 HgbA1c 6.5 Lovenox for VTE prophylaxis aspirin 81 mg daily  prior to admission, now on aspirin 81 mg daily and clopidogrel 75 mg daily for 3 send then Plavix alone. Ongoing aggressive stroke risk factor management Therapy recommendations: CIR Disposition: Pending  History of stroke 01/12/2014 left brain multifocal small infarcts.  Carotid Doppler showed left ICA 80 to 99% stenosis.  Discharged on aspirin and Plavix as well as statin.  Patient had left CEA on 01/19/2014. 04/2018 admitted for intermittent slurred speech.  CT no acute abnormality.  MRI no acute stroke.  MRA showed multifocal intracranial stenosis but no LVO.  CT head and neck severe VBJ stenosis, moderate left ICA, moderate to severe right M2 and moderate left P2 stenosis.  EF 60 to 65%, LDL 127, A1c 6.2  discharged on aspirin and Lipitor 40.  Diabetes HgbA1c 6.5 goal < 7.0 Controlled CBG monitoring SSI DM education and close PCP follow up  Hypertension High on admission Gradual normalize BP in 2 to 3 days Long term BP goal normotensive  Hyperlipidemia Home meds: None, supposed on Lipitor 80 but patient not taking LDL 145, goal < 70 Now on Lipitor 80 Continue statin at discharge  Other Stroke Risk Factors Advanced age  Other Active Problems AKI, creatinine 1.3-> 1.2  Hospital day # 0  Neurology will sign off. Please call with questions. Pt will follow up with stroke clinic NP at [facility] in about 4 weeks. Thanks for the consult.   Marvel Plan, MD PhD Stroke Neurology 11/13/2022 7:01 PM    To contact Stroke Continuity provider, please refer to [email or web address]. After hours, contact General Neurology

## 2022-11-13 NOTE — Evaluation (Signed)
Speech Language Pathology Evaluation Patient Details Name: Kathleen Figueroa MRN: [id number] DOB: 1935-08-21 Today's Date: 11/13/2022 Time: 1324-4010 SLP Time Calculation (min) (ACUTE ONLY): 15 min  Problem List:  Patient Active Problem List   Diagnosis Date Noted   CVA (cerebral vascular accident) (HCC) 11/12/2022   Hypokalemia 11/12/2022   Diabetes mellitus type 2, uncontrolled, 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   Acute renal failure (ARF) (HCC) 05/06/2018   S/P carotid endarterectomy 05/28/2014   Cerebral infarction (HCC) 02/12/2014   Bilateral carotid artery occlusion 02/12/2014   Carotid stenosis 01/19/2014   Carotid artery disease (HCC) 01/14/2014   Acute CVA (cerebrovascular accident) (HCC) 01/13/2014   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 [facility])    Essential hypertension    Cerebral infarction (HCC) 01/12/2014   Hyperlipidemia 01/12/2014   Diabetes mellitus without complication (HCC)    Hypertension    TIA (transient ischemic attack)    Past Medical History:  Past Medical History:  Diagnosis Date   Acute renal failure (ARF) (HCC) 05/06/2018   Arthritis    Diabetes mellitus type 2, uncontrolled, 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   H/O detached retina repair    Hypertension    TIA (transient ischemic attack)    Past Surgical History:  Past Surgical History:  Procedure Laterality Date   ENDARTERECTOMY Left 01/19/2014   Procedure: ENDARTERECTOMY CAROTID-LEFT;  Surgeon: Nada Libman, MD;  Location: [facility] OR;  Service: Vascular;  Laterality: Left;   EYE SURGERY     lens removed from right eye   PATCH ANGIOPLASTY Left 01/19/2014   Procedure: PATCH ANGIOPLASTY, LEFT CAROTID ARTERY USING HEMASHIELD PLATINUM FINESSE PATCH;  Surgeon: Nada Libman, MD;  Location: [facility] OR;  Service: Vascular;  Laterality: Left;   TONSILLECTOMY     HPI:  87 y.o. female presented to [facility] ED for evaluation of dizziness, numbness and tingling of the right side  of her face. MRI revealed an punctate acute infarct of the R temporal lobe. Past medical history of hypertension, diabetes, carotid stenosis s/p left carotid endarterectomy and TIAs   Assessment / Plan / Recommendation Clinical Impression  Pt participated in cognitive-linguistic assessment. She is a good historian; describes occasional word-finding problems that have persisted since prior CVA. Presents with excellent comprehension of multi-step commands, abstract language.  Expression is WFL. Speech is clear and fluent.  Cognition at baseline per pt and dtr at bedside. No SLP f/u is warranted.    SLP Assessment  SLP Recommendation/Assessment: Patient does not need any further Speech Lanaguage Pathology Services SLP Visit Diagnosis: Cognitive communication deficit (R41.841)    Recommendations for follow up therapy are one component of a multi-disciplinary discharge planning process, led by the attending physician.  Recommendations may be updated based on patient status, additional functional criteria and insurance authorization.    Follow Up Recommendations  No SLP follow up                       SLP Evaluation Cognition  Overall Cognitive Status: Within Functional Limits for tasks assessed Arousal/Alertness: Awake/alert Orientation Level: Oriented X4 Attention: Selective Selective Attention: Appears intact Awareness: Appears intact       Comprehension  Auditory Comprehension Overall Auditory Comprehension: Appears within functional limits for tasks assessed Yes/No Questions: Within Functional Limits Commands: Within Functional Limits Conversation: Complex Visual Recognition/Discrimination Discrimination: Within Function Limits    Expression Expression Primary Mode of Expression: Verbal Verbal Expression Overall Verbal Expression: Appears within functional limits for tasks assessed Level of  Generative/Spontaneous Verbalization: Conversation Repetition: No  impairment Naming: Impairment Confrontation: Within functional limits Divergent: 50-74% accurate Pragmatics: No impairment Written Expression Dominant Hand: Right Written Expression: Within Functional Limits   Oral / Motor  Oral Motor/Sensory Function Overall Oral Motor/Sensory Function: Within functional limits Motor Speech Overall Motor Speech: Appears within functional limits for tasks assessed            Blenda Mounts Laurice 11/13/2022, 1:22 PM Nijae Doyel L. Samson Frederic, MA CCC/SLP Clinical Specialist - Acute Care SLP Acute Rehabilitation Services Office number [phone]

## 2022-11-13 NOTE — Progress Notes (Signed)
   11/13/22 1400  Spiritual Encounters  Type of Visit Attempt (pt unavailable)   Ch responded to request for AD. There no family present at bedside. Pt was sleeping. Ch will follow-up.

## 2022-11-13 NOTE — Plan of Care (Signed)
  Problem: Education: Goal: Knowledge of General Education information will improve Description: Including pain rating scale, medication(s)/side effects and non-pharmacologic comfort measures Outcome: Completed/Met   Problem: Health Behavior/Discharge Planning: Goal: Ability to manage health-related needs will improve Outcome: Completed/Met   Problem: Clinical Measurements: Goal: Ability to maintain clinical measurements within normal limits will improve Outcome: Completed/Met Goal: Will remain free from infection Outcome: Completed/Met Goal: Diagnostic test results will improve Outcome: Completed/Met Goal: Respiratory complications will improve Outcome: Completed/Met Goal: Cardiovascular complication will be avoided Outcome: Completed/Met   Problem: Activity: Goal: Risk for activity intolerance will decrease Outcome: Completed/Met   Problem: Nutrition: Goal: Adequate nutrition will be maintained Outcome: Completed/Met   Problem: Coping: Goal: Level of anxiety will decrease Outcome: Completed/Met   Problem: Elimination: Goal: Will not experience complications related to bowel motility Outcome: Completed/Met Goal: Will not experience complications related to urinary retention Outcome: Completed/Met   Problem: Pain Managment: Goal: General experience of comfort will improve Outcome: Completed/Met   Problem: Safety: Goal: Ability to remain free from injury will improve Outcome: Completed/Met   Problem: Skin Integrity: Goal: Risk for impaired skin integrity will decrease Outcome: Completed/Met   Problem: Education: Goal: Ability to describe self-care measures that may prevent or decrease complications (Diabetes Survival Skills Education) will improve Outcome: Completed/Met Goal: Individualized Educational Video(s) Outcome: Completed/Met   Problem: Coping: Goal: Ability to adjust to condition or change in health will improve Outcome: Completed/Met   Problem:  Fluid Volume: Goal: Ability to maintain a balanced intake and output will improve Outcome: Completed/Met   Problem: Health Behavior/Discharge Planning: Goal: Ability to identify and utilize available resources and services will improve Outcome: Completed/Met Goal: Ability to manage health-related needs will improve Outcome: Completed/Met   Problem: Metabolic: Goal: Ability to maintain appropriate glucose levels will improve Outcome: Completed/Met   Problem: Nutritional: Goal: Maintenance of adequate nutrition will improve Outcome: Completed/Met Goal: Progress toward achieving an optimal weight will improve Outcome: Completed/Met   Problem: Skin Integrity: Goal: Risk for impaired skin integrity will decrease Outcome: Completed/Met   Problem: Tissue Perfusion: Goal: Adequacy of tissue perfusion will improve Outcome: Completed/Met   Problem: Education: Goal: Knowledge of disease or condition will improve Outcome: Completed/Met Goal: Knowledge of secondary prevention will improve (MUST DOCUMENT ALL) Outcome: Completed/Met Goal: Knowledge of patient specific risk factors will improve Loraine Leriche N/A or DELETE if not current risk factor) Outcome: Completed/Met   Problem: Ischemic Stroke/TIA Tissue Perfusion: Goal: Complications of ischemic stroke/TIA will be minimized Outcome: Completed/Met   Problem: Coping: Goal: Will verbalize positive feelings about self Outcome: Completed/Met Goal: Will identify appropriate support needs Outcome: Completed/Met   Problem: Health Behavior/Discharge Planning: Goal: Ability to manage health-related needs will improve Outcome: Completed/Met Goal: Goals will be collaboratively established with patient/family Outcome: Completed/Met   Problem: Self-Care: Goal: Ability to participate in self-care as condition permits will improve Outcome: Completed/Met Goal: Verbalization of feelings and concerns over difficulty with self-care will  improve Outcome: Completed/Met Goal: Ability to communicate needs accurately will improve Outcome: Completed/Met   Problem: Nutrition: Goal: Risk of aspiration will decrease Outcome: Completed/Met Goal: Dietary intake will improve Outcome: Completed/Met

## 2022-11-13 NOTE — Progress Notes (Addendum)
PROGRESS NOTE        PATIENT DETAILS Name: Kathleen Figueroa Age: 87 y.o. Sex: female Date of Birth: 07/25/1935 Admit Date: 11/12/2022 Admitting Physician Clydie Braun, MD WUJ:[id number], [facility]  Brief Summary: Patient is a 87 y.o.  female with history of DM-2, HTN, prior carotid stenosis s/p left carotid endarterectomy 2015-who presented with dizziness/tingling in the right jaw-patient was found to have acute CVA and subsequently admitted to the hospitalist service.  Significant events: 10/3>> admit to [facility]  Significant studies: 10/3>> MRI brain: Acute infarct right temporal lobe. 10/4>> TSH: 1.006 10/4>> A1c 6.5 10/4>> LDL 145 10/4>> ESR: 4  Significant microbiology data: 10/4>> COVID PCR: Negative 10/4>> respiratory virus panel: Negative  Procedures: None  Consults: Neurology  Subjective: Seen twice-was complaining of headache-right-sided tingling/numbness-sweats this morning-oral/rectal temperature both was normal.  Later this morning-she is lying comfortably at bedside chair-family at bedside-her headache/tingling/numbness/sweats have all resolved.   Objective: Vitals: Blood pressure (!) 145/71, pulse (!) 57, temperature 98.6 F (37 C), temperature source Rectal, resp. rate (!) 24, height 5\' 6"  (1.676 m), weight 77.1 kg, SpO2 97%.   Exam: Gen Exam: In mild distress due to headache. HEENT:atraumatic, normocephalic Chest: B/L clear to auscultation anteriorly CVS:S1S2 regular Abdomen:soft non tender, non distended Extremities:no edema Neurology: Non focal Skin: no rash  Pertinent Labs/Radiology:    Latest Ref Rng & Units 11/13/2022    5:39 AM 11/12/2022    8:04 AM 08/07/2021   10:10 PM  CBC  WBC 4.0 - 10.5 K/uL 8.3  6.0  6.4   Hemoglobin 12.0 - 15.0 g/dL 56.2  13.0  86.5   Hematocrit 36.0 - 46.0 % 48.4  46.4  44.2   Platelets 150 - 400 K/uL 188  170  166     Lab Results  Component Value Date   NA 137 11/13/2022    K 4.9 11/13/2022   CL 97 (L) 11/13/2022   CO2 30 11/13/2022      Assessment/Plan: Headache with right-sided facial tingling/numbness/sweats/dizziness ?  Migraine Headache has resolved after getting Zofran this morning Doubt this is related to small punctate CVA Inflammatory markers not elevated-doubt GCA Await neurology input  Acute CVA Could be an incidental finding Workup in progress-CTA/echo pending On aspirin/Plavix/statin Await neurology input  History of left carotid endarterectomy 2015 Await CTA Statin/antiplatelets as above  HTN Given CVA-permissive hypertension was allowed BP currently reasonable on amlodipine Chlorthalidone/valsartan remain on hold  HLD Statin  DM-2 Resume metformin on discharge  Addendum: PT initially recommended CIR, TOC has spoken w family/pt-they prefer going home with home health  BMI: Estimated body mass index is 27.44 kg/m as calculated from the following:   Height as of this encounter: 5\' 6"  (1.676 m).   Weight as of this encounter: 77.1 kg.   Code status:   Code Status: Full Code   DVT Prophylaxis: enoxaparin (LOVENOX) injection 40 mg Start: 11/12/22 1800   Family Communication: None at bedside   Disposition Plan: Status is: Observation The patient remains OBS appropriate and will d/c before 2 midnights.   Planned Discharge Destination:Home   Diet: Diet Order             Diet Heart Room service appropriate? Yes; Fluid consistency: Thin  Diet effective now  Antimicrobial agents: Anti-infectives (From admission, onward)    None        MEDICATIONS: Scheduled Meds:  amLODipine  10 mg Oral Daily   aspirin EC  81 mg Oral Daily   atorvastatin  80 mg Oral Daily   clopidogrel  75 mg Oral Daily   enoxaparin (LOVENOX) injection  40 mg Subcutaneous Q24H   ezetimibe  10 mg Oral Daily   multivitamin with minerals  1 tablet Oral Daily   Continuous Infusions:  promethazine (PHENERGAN)  injection (IM or IVPB) Stopped (11/12/22 2202)   PRN Meds:.acetaminophen **OR** acetaminophen (TYLENOL) oral liquid 160 mg/5 mL **OR** acetaminophen, ondansetron (ZOFRAN) IV, promethazine (PHENERGAN) injection (IM or IVPB)   I have personally reviewed following labs and imaging studies  LABORATORY DATA: CBC: Recent Labs  Lab 11/12/22 0804 11/13/22 0539  WBC 6.0 8.3  NEUTROABS 4.1  --   HGB 15.0 15.3*  HCT 46.4* 48.4*  MCV 86.1 88.3  PLT 170 188    Basic Metabolic Panel: Recent Labs  Lab 11/12/22 0804 11/13/22 0539  NA 140 137  K 3.4* 4.9  CL 98 97*  CO2 32 30  GLUCOSE 119* 124*  BUN 39* 20  CREATININE 1.30* 1.20*  CALCIUM 10.0 9.2  MG  --  1.8    GFR: Estimated Creatinine Clearance: 35.9 mL/min (A) (by C-G formula based on SCr of 1.2 mg/dL (H)).  Liver Function Tests: Recent Labs  Lab 11/12/22 0804  AST 20  ALT 12  ALKPHOS 68  BILITOT 0.4  PROT 8.1  ALBUMIN 4.6   No results for input(s): "LIPASE", "AMYLASE" in the last 168 hours. No results for input(s): "AMMONIA" in the last 168 hours.  Coagulation Profile: No results for input(s): "INR", "PROTIME" in the last 168 hours.  Cardiac Enzymes: No results for input(s): "CKTOTAL", "CKMB", "CKMBINDEX", "TROPONINI" in the last 168 hours.  BNP (last 3 results) No results for input(s): "PROBNP" in the last 8760 hours.  Lipid Profile: Recent Labs    11/13/22 0539  CHOL 227*  HDL 71  LDLCALC 145*  TRIG 54  CHOLHDL 3.2    Thyroid Function Tests: Recent Labs    11/13/22 0539  TSH 1.006    Anemia Panel: No results for input(s): "VITAMINB12", "FOLATE", "FERRITIN", "TIBC", "IRON", "RETICCTPCT" in the last 72 hours.  Urine analysis:    Component Value Date/Time   COLORURINE COLORLESS (A) 11/12/2022 0804   APPEARANCEUR CLEAR 11/12/2022 0804   LABSPEC 1.015 11/12/2022 0804   PHURINE 7.0 11/12/2022 0804   GLUCOSEU NEGATIVE 11/12/2022 0804   HGBUR NEGATIVE 11/12/2022 0804   BILIRUBINUR NEGATIVE  11/12/2022 0804   KETONESUR NEGATIVE 11/12/2022 0804   PROTEINUR NEGATIVE 11/12/2022 0804   UROBILINOGEN 0.2 01/18/2014 1350   NITRITE NEGATIVE 11/12/2022 0804   LEUKOCYTESUR NEGATIVE 11/12/2022 0804    Sepsis Labs: Lactic Acid, Venous No results found for: "LATICACIDVEN"  MICROBIOLOGY: Recent Results (from the past 240 hour(s))  SARS Coronavirus 2 by RT PCR (hospital order, performed in [facility] hospital lab) *cepheid single result test* Anterior Nasal Swab     Status: None   Collection Time: 11/13/22 12:04 AM   Specimen: Anterior Nasal Swab  Result Value Ref Range Status   SARS Coronavirus 2 by RT PCR NEGATIVE NEGATIVE Final    Comment: Performed at [facility], [address], [place], [place] [address]  Respiratory (~20 pathogens) panel by PCR     Status: None   Collection Time: 11/13/22 12:04 AM   Specimen: Nasopharyngeal  Swab; Respiratory  Result Value Ref Range Status   Adenovirus NOT DETECTED NOT DETECTED Final   Coronavirus 229E NOT DETECTED NOT DETECTED Final    Comment: (NOTE) The Coronavirus on the Respiratory Panel, DOES NOT test for the novel  Coronavirus (2019 nCoV)    Coronavirus HKU1 NOT DETECTED NOT DETECTED Final   Coronavirus NL63 NOT DETECTED NOT DETECTED Final   Coronavirus OC43 NOT DETECTED NOT DETECTED Final   Metapneumovirus NOT DETECTED NOT DETECTED Final   Rhinovirus / Enterovirus NOT DETECTED NOT DETECTED Final   Influenza A NOT DETECTED NOT DETECTED Final   Influenza B NOT DETECTED NOT DETECTED Final   Parainfluenza Virus 1 NOT DETECTED NOT DETECTED Final   Parainfluenza Virus 2 NOT DETECTED NOT DETECTED Final   Parainfluenza Virus 3 NOT DETECTED NOT DETECTED Final   Parainfluenza Virus 4 NOT DETECTED NOT DETECTED Final   Respiratory Syncytial Virus NOT DETECTED NOT DETECTED Final   Bordetella pertussis NOT DETECTED NOT DETECTED Final   Bordetella Parapertussis NOT DETECTED NOT DETECTED Final   Chlamydophila pneumoniae NOT DETECTED  NOT DETECTED Final   Mycoplasma pneumoniae NOT DETECTED NOT DETECTED Final    Comment: Performed at [facility], [address], [place], [place] [address]    RADIOLOGY STUDIES/RESULTS: MR BRAIN WO CONTRAST  Result Date: 11/12/2022 CLINICAL DATA:  Neuro deficit, acute, stroke suspected EXAM: MRI HEAD WITHOUT CONTRAST TECHNIQUE: Multiplanar, multiecho pulse sequences of the brain and surrounding structures were obtained without intravenous contrast. COMPARISON:  None Available. FINDINGS: Brain: Punctate acute infarct in the right temporal lobe (series 2, image 76). No hemorrhage. No hydrocephalus. No extra-axial fluid collection. No mass effect. No mass lesion. There is chronic infarct in the left parietal lobe. There is sequela of severe chronic microvascular ischemic change. Chronic infarcts in the right basal ganglia, central pons, genu of the corpus callosum, and in the right cerebellum. Generalized volume loss without lobar predominance. Vascular: Normal flow voids. Skull and upper cervical spine: Normal marrow signal. Sinuses/Orbits: Middle ear or mastoid effusion. Paranasal sinuses are notable for mucosal thickening in the right maxillary sinus. Bilateral lens replacement. Orbits are otherwise unremarkable. Other: None. IMPRESSION: 1. Punctate acute infarct in the right temporal lobe. 2. Sequela of severe chronic microvascular ischemic change with multiple chronic infarcts, as above. Electronically Signed   By: Lorenza Cambridge M.D.   On: 11/12/2022 12:47   CT HEAD WO CONTRAST  Result Date: 11/12/2022 CLINICAL DATA:  Neuro deficit with acute stroke suspected. Dizziness and sweating with tingling in the right jaw. Weakness that started this morning EXAM: CT HEAD WITHOUT CONTRAST TECHNIQUE: Contiguous axial images were obtained from the base of the skull through the vertex without intravenous contrast. RADIATION DOSE REDUCTION: This exam was performed according to the departmental  dose-optimization program which includes automated exposure control, adjustment of the mA and/or kV according to patient size and/or use of iterative reconstruction technique. COMPARISON:  08/07/2021 FINDINGS: Brain: No evidence of acute infarction, hemorrhage, hydrocephalus, extra-axial collection or mass lesion/mass effect. Extensive chronic small vessel ischemic low-density in the cerebral white matter and deep gray nuclei with chronic lacunar infarcts at the basal ganglia, bilateral thalamus, and left para median pons. Chronic left frontal parietal infarct centered at the cortex. Mild cerebral volume loss. Vascular: No hyperdense vessel or unexpected calcification. Skull: Normal. Negative for fracture or focal lesion. Sinuses/Orbits: No acute finding. IMPRESSION: 1. No acute or interval finding. 2. Advanced chronic ischemic injury. Electronically Signed   By: Audry Riles.D.  On: 11/12/2022 09:13     LOS: 0 days   Jeoffrey Massed, MD  [facility]    To contact the attending provider between 7A-7P or the covering provider during after hours 7P-7A, please log into the web site [email or web address] and access using universal [place] password for that web site. If you do not have the password, please call the hospital operator.  11/13/2022, 11:01 AM

## 2022-11-13 NOTE — Progress Notes (Signed)
  Echocardiogram 2D Echocardiogram has been performed.  Lucendia Herrlich 11/13/2022, 2:36 PM

## 2022-11-13 NOTE — Care Management (Addendum)
Transition of Care [facility]) - Inpatient Brief Assessment   Patient Details  Name: Kathleen Figueroa MRN: [id number] Date of Birth: 20-Sep-1935  [facility]) CM/SW Contact:    Lockie Pares, RN Phone Number: 11/13/2022, 4:22 PM   Clinical Narrative:  Patient from home. PT recommended CIR, Called patient and discussed CIR vs HH. She currently has no HH services. She will call this RNCM back with decision.  She wanted me to call her brother, but could not find his number Updated team ( MD, PT CSW, Nursing) regarding disposition. [id number] Spoke to daughter [name] over the phone discussed differences between Cir ( inpatient rehab) and home health. Brother is making decisions with patient and he is not available right now but sent Sherrie questions to go over. Home health is preferred, they would like optimal services and  to have aides in while they are at work M-F. She does have Long term care. They would like an agency that works well with their insurance Discussed with [facility], as they have HH and private aide care through Long term care.  She was accepted and team was notified. She will DC later today with HH. Sherrie can pick patient up. Ordered walker through [employer] Transition of Care Asessment: Insurance and Status: Insurance coverage has been reviewed Patient has primary care physician: Yes Home environment has been reviewed: Apartment Prior level of function:: Independent Prior/Current Home Services: No current home services Social Determinants of Health Reivew: SDOH reviewed no interventions necessary Readmission risk has been reviewed: Yes Transition of care needs: transition of care needs identified, TOC will continue to follow

## 2022-11-13 NOTE — Discharge Summary (Signed)
PATIENT DETAILS Name: Kathleen Figueroa Age: 87 y.o. Sex: female Date of Birth: 1935-09-30 MRN: [id number]. Admitting Physician: Clydie Braun, MD QVZ:DGLOVFIEPP, [facility]  Admit Date: 11/12/2022 Discharge date: 11/13/2022  Recommendations for Outpatient Follow-up:  Follow up with PCP in 1-2 weeks Please obtain CMP/CBC in one week Please ensure follow up with Neurology:  Admitted From:  Home  Disposition: Home health   Discharge Condition: good  CODE STATUS:   Code Status: Full Code   Diet recommendation:  Diet Order             Diet - low sodium heart healthy           Diet Carb Modified           Diet Heart Room service appropriate? Yes; Fluid consistency: Thin  Diet effective now                    Brief Summary: Patient is a 87 y.o.  female with history of DM-2, HTN, prior carotid stenosis s/p left carotid endarterectomy 2015-who presented with dizziness/tingling in the right jaw-patient was found to have acute CVA and subsequently admitted to the hospitalist service.   Significant events: 10/3>> admit to [facility]   Significant studies: 10/3>> MRI brain: Acute infarct right temporal lobe. 10/4>> TSH: 1.006 10/4>> A1c 6.5 10/4>> LDL 145 10/4>> ESR: 4 10/4>>CTA head/neck:severe right V4 stenosis, severe left vertebral artry origin stenosis 10/4>>Echo:EF 60-65%   Significant microbiology data: 10/4>> COVID PCR: Negative 10/4>> respiratory virus panel: Negative   Procedures: None   Consults: Neurology  Brief Hospital Course: Headache with right-sided facial tingling/numbness/sweats/dizziness ?  Migraine Headache has resolved after getting Zofran this morning Doubt this is related to small punctate CVA Inflammatory markers not elevated-doubt GCA If episode recur-will need Neurology eval   Acute CVA Could be an incidental finding Workup as above Spoke with Dr Xu-ASA/Plavix x 3 weeks then Plavix alone Non compliant to  statin-counseled to resume statin as prior-PCP to recheck lipid panel in 3-4 months after statin compliance PT/OT recommended CIR-TOC team spoke with patient/family-plan is to d/c home with home health   History of left carotid endarterectomy 2015 CTA above Statin/antiplatelets as above   HTN Given CVA-permissive hypertension was allowed BP currently reasonable on amlodipine Chlorthalidone/valsartan to resume post discharge   HLD Statin-see above   DM-2 Resume metformin on discharge   BMI: Estimated body mass index is 27.44 kg/m as calculated from the following:   Height as of this encounter: 5\' 6"  (1.676 m).   Weight as of this encounter: 77.1 kg.    Discharge Diagnoses:  Principal Problem:   CVA (cerebral vascular accident) [facility] Active Problems:   Diabetes mellitus without complication (HCC)   Hypertension   Hyperlipidemia   Carotid artery disease (HCC)   Carotid stenosis   S/P carotid endarterectomy   Hypokalemia   Discharge Instructions:  Activity:  As tolerated with Full fall precautions use walker/cane & assistance as needed   Discharge Instructions     Ambulatory referral to Neurology   Complete by: As directed    Diet - low sodium heart healthy   Complete by: As directed    Diet Carb Modified   Complete by: As directed    Discharge instructions   Complete by: As directed    Follow with Primary MD  [facility] in 1-2 weeks  Please get a complete blood count and chemistry panel checked by your Primary MD at your next  By: Tiburcio Pea M.D.   On: 11/12/2022 09:13     TODAY-DAY OF DISCHARGE:  Subjective:   Kathleen Figueroa today has no headache,no chest abdominal pain,no new weakness tingling or numbness, feels much better wants to go home today.   Objective:   Blood pressure (!) 112/48, pulse 80, temperature 98.4 F (36.9 C), temperature source Oral, resp. rate 15, height 5\' 6"  (1.676 m), weight 77.1 kg, SpO2 92%.  Intake/Output Summary (Last 24 hours) at 11/13/2022 1713 Last data filed at 11/13/2022 0500 Gross per 24 hour  Intake 731.17 ml  Output --  Net 731.17 ml   Filed Weights   11/12/22 0737  Weight: 77.1 kg    Exam: Awake Alert, Oriented *3, No new F.N deficits, Normal affect [place].AT,PERRAL Supple Neck,No JVD, No cervical lymphadenopathy appriciated.  Symmetrical Chest wall movement, Good air movement bilaterally, CTAB RRR,No Gallops,Rubs or new Murmurs, No Parasternal Heave +ve B.Sounds, Abd Soft, Non tender, No organomegaly appriciated, No rebound -guarding or rigidity. No Cyanosis, Clubbing or edema, No new Rash or bruise   PERTINENT RADIOLOGIC STUDIES: ECHOCARDIOGRAM COMPLETE  Result Date: 11/13/2022    ECHOCARDIOGRAM REPORT   Patient Name:   Kathleen Figueroa Date of Exam: 11/13/2022 Medical Rec #:  [id number]      Height:       66.0 in Accession #:    [id number]     Weight:       170.0 lb  Date of Birth:  Jul 09, 1935      BSA:          1.866 m Patient Age:    87 years       BP:           138/66 mmHg Patient Gender: F              HR:           67 bpm. Exam Location:  Inpatient Procedure: 2D Echo, Cardiac Doppler and Color Doppler Indications:    Stroke I63.9  History:        Patient has prior history of Echocardiogram examinations, most                 recent 05/07/2018. CAD, Stroke and TIA; Risk                 Factors:Hypertension, Diabetes and Dyslipidemia.  Sonographer:    Lucendia Herrlich RCS Referring Phys: [id number] [facility] [place] IMPRESSIONS  1. Left ventricular ejection fraction, by estimation, is 60 to 65%. The left ventricle has normal function. The left ventricle has no regional wall motion abnormalities. There is mild concentric left ventricular hypertrophy. Left ventricular diastolic parameters are consistent with Grade I diastolic dysfunction (impaired relaxation).  2. Right ventricular systolic function is normal. The right ventricular size is normal.  3. The mitral valve is normal in structure. No evidence of mitral valve regurgitation. No evidence of mitral stenosis.  4. The aortic valve is normal in structure. Aortic valve regurgitation is not visualized. No aortic stenosis is present.  5. Abdominal aorta is normal.  6. The inferior vena cava is normal in size with greater than 50% respiratory variability, suggesting right atrial pressure of 3 mmHg. FINDINGS  Left Ventricle: Left ventricular ejection fraction, by estimation, is 60 to 65%. The left ventricle has normal function. The left ventricle has no regional wall motion abnormalities. The left ventricular internal cavity size was normal in size. There is  mild concentric left ventricular hypertrophy. Left ventricular diastolic parameters are consistent  PATIENT DETAILS Name: Kathleen Figueroa Age: 87 y.o. Sex: female Date of Birth: 1935-09-30 MRN: [id number]. Admitting Physician: Clydie Braun, MD QVZ:DGLOVFIEPP, [facility]  Admit Date: 11/12/2022 Discharge date: 11/13/2022  Recommendations for Outpatient Follow-up:  Follow up with PCP in 1-2 weeks Please obtain CMP/CBC in one week Please ensure follow up with Neurology:  Admitted From:  Home  Disposition: Home health   Discharge Condition: good  CODE STATUS:   Code Status: Full Code   Diet recommendation:  Diet Order             Diet - low sodium heart healthy           Diet Carb Modified           Diet Heart Room service appropriate? Yes; Fluid consistency: Thin  Diet effective now                    Brief Summary: Patient is a 87 y.o.  female with history of DM-2, HTN, prior carotid stenosis s/p left carotid endarterectomy 2015-who presented with dizziness/tingling in the right jaw-patient was found to have acute CVA and subsequently admitted to the hospitalist service.   Significant events: 10/3>> admit to [facility]   Significant studies: 10/3>> MRI brain: Acute infarct right temporal lobe. 10/4>> TSH: 1.006 10/4>> A1c 6.5 10/4>> LDL 145 10/4>> ESR: 4 10/4>>CTA head/neck:severe right V4 stenosis, severe left vertebral artry origin stenosis 10/4>>Echo:EF 60-65%   Significant microbiology data: 10/4>> COVID PCR: Negative 10/4>> respiratory virus panel: Negative   Procedures: None   Consults: Neurology  Brief Hospital Course: Headache with right-sided facial tingling/numbness/sweats/dizziness ?  Migraine Headache has resolved after getting Zofran this morning Doubt this is related to small punctate CVA Inflammatory markers not elevated-doubt GCA If episode recur-will need Neurology eval   Acute CVA Could be an incidental finding Workup as above Spoke with Dr Xu-ASA/Plavix x 3 weeks then Plavix alone Non compliant to  statin-counseled to resume statin as prior-PCP to recheck lipid panel in 3-4 months after statin compliance PT/OT recommended CIR-TOC team spoke with patient/family-plan is to d/c home with home health   History of left carotid endarterectomy 2015 CTA above Statin/antiplatelets as above   HTN Given CVA-permissive hypertension was allowed BP currently reasonable on amlodipine Chlorthalidone/valsartan to resume post discharge   HLD Statin-see above   DM-2 Resume metformin on discharge   BMI: Estimated body mass index is 27.44 kg/m as calculated from the following:   Height as of this encounter: 5\' 6"  (1.676 m).   Weight as of this encounter: 77.1 kg.    Discharge Diagnoses:  Principal Problem:   CVA (cerebral vascular accident) [facility] Active Problems:   Diabetes mellitus without complication (HCC)   Hypertension   Hyperlipidemia   Carotid artery disease (HCC)   Carotid stenosis   S/P carotid endarterectomy   Hypokalemia   Discharge Instructions:  Activity:  As tolerated with Full fall precautions use walker/cane & assistance as needed   Discharge Instructions     Ambulatory referral to Neurology   Complete by: As directed    Diet - low sodium heart healthy   Complete by: As directed    Diet Carb Modified   Complete by: As directed    Discharge instructions   Complete by: As directed    Follow with Primary MD  [facility] in 1-2 weeks  Please get a complete blood count and chemistry panel checked by your Primary MD at your next  By: Tiburcio Pea M.D.   On: 11/12/2022 09:13     TODAY-DAY OF DISCHARGE:  Subjective:   Kathleen Figueroa today has no headache,no chest abdominal pain,no new weakness tingling or numbness, feels much better wants to go home today.   Objective:   Blood pressure (!) 112/48, pulse 80, temperature 98.4 F (36.9 C), temperature source Oral, resp. rate 15, height 5\' 6"  (1.676 m), weight 77.1 kg, SpO2 92%.  Intake/Output Summary (Last 24 hours) at 11/13/2022 1713 Last data filed at 11/13/2022 0500 Gross per 24 hour  Intake 731.17 ml  Output --  Net 731.17 ml   Filed Weights   11/12/22 0737  Weight: 77.1 kg    Exam: Awake Alert, Oriented *3, No new F.N deficits, Normal affect [place].AT,PERRAL Supple Neck,No JVD, No cervical lymphadenopathy appriciated.  Symmetrical Chest wall movement, Good air movement bilaterally, CTAB RRR,No Gallops,Rubs or new Murmurs, No Parasternal Heave +ve B.Sounds, Abd Soft, Non tender, No organomegaly appriciated, No rebound -guarding or rigidity. No Cyanosis, Clubbing or edema, No new Rash or bruise   PERTINENT RADIOLOGIC STUDIES: ECHOCARDIOGRAM COMPLETE  Result Date: 11/13/2022    ECHOCARDIOGRAM REPORT   Patient Name:   Kathleen Figueroa Date of Exam: 11/13/2022 Medical Rec #:  [id number]      Height:       66.0 in Accession #:    [id number]     Weight:       170.0 lb  Date of Birth:  Jul 09, 1935      BSA:          1.866 m Patient Age:    87 years       BP:           138/66 mmHg Patient Gender: F              HR:           67 bpm. Exam Location:  Inpatient Procedure: 2D Echo, Cardiac Doppler and Color Doppler Indications:    Stroke I63.9  History:        Patient has prior history of Echocardiogram examinations, most                 recent 05/07/2018. CAD, Stroke and TIA; Risk                 Factors:Hypertension, Diabetes and Dyslipidemia.  Sonographer:    Lucendia Herrlich RCS Referring Phys: [id number] [facility] [place] IMPRESSIONS  1. Left ventricular ejection fraction, by estimation, is 60 to 65%. The left ventricle has normal function. The left ventricle has no regional wall motion abnormalities. There is mild concentric left ventricular hypertrophy. Left ventricular diastolic parameters are consistent with Grade I diastolic dysfunction (impaired relaxation).  2. Right ventricular systolic function is normal. The right ventricular size is normal.  3. The mitral valve is normal in structure. No evidence of mitral valve regurgitation. No evidence of mitral stenosis.  4. The aortic valve is normal in structure. Aortic valve regurgitation is not visualized. No aortic stenosis is present.  5. Abdominal aorta is normal.  6. The inferior vena cava is normal in size with greater than 50% respiratory variability, suggesting right atrial pressure of 3 mmHg. FINDINGS  Left Ventricle: Left ventricular ejection fraction, by estimation, is 60 to 65%. The left ventricle has normal function. The left ventricle has no regional wall motion abnormalities. The left ventricular internal cavity size was normal in size. There is  mild concentric left ventricular hypertrophy. Left ventricular diastolic parameters are consistent  PATIENT DETAILS Name: Kathleen Figueroa Age: 87 y.o. Sex: female Date of Birth: 1935-09-30 MRN: [id number]. Admitting Physician: Clydie Braun, MD QVZ:DGLOVFIEPP, [facility]  Admit Date: 11/12/2022 Discharge date: 11/13/2022  Recommendations for Outpatient Follow-up:  Follow up with PCP in 1-2 weeks Please obtain CMP/CBC in one week Please ensure follow up with Neurology:  Admitted From:  Home  Disposition: Home health   Discharge Condition: good  CODE STATUS:   Code Status: Full Code   Diet recommendation:  Diet Order             Diet - low sodium heart healthy           Diet Carb Modified           Diet Heart Room service appropriate? Yes; Fluid consistency: Thin  Diet effective now                    Brief Summary: Patient is a 87 y.o.  female with history of DM-2, HTN, prior carotid stenosis s/p left carotid endarterectomy 2015-who presented with dizziness/tingling in the right jaw-patient was found to have acute CVA and subsequently admitted to the hospitalist service.   Significant events: 10/3>> admit to [facility]   Significant studies: 10/3>> MRI brain: Acute infarct right temporal lobe. 10/4>> TSH: 1.006 10/4>> A1c 6.5 10/4>> LDL 145 10/4>> ESR: 4 10/4>>CTA head/neck:severe right V4 stenosis, severe left vertebral artry origin stenosis 10/4>>Echo:EF 60-65%   Significant microbiology data: 10/4>> COVID PCR: Negative 10/4>> respiratory virus panel: Negative   Procedures: None   Consults: Neurology  Brief Hospital Course: Headache with right-sided facial tingling/numbness/sweats/dizziness ?  Migraine Headache has resolved after getting Zofran this morning Doubt this is related to small punctate CVA Inflammatory markers not elevated-doubt GCA If episode recur-will need Neurology eval   Acute CVA Could be an incidental finding Workup as above Spoke with Dr Xu-ASA/Plavix x 3 weeks then Plavix alone Non compliant to  statin-counseled to resume statin as prior-PCP to recheck lipid panel in 3-4 months after statin compliance PT/OT recommended CIR-TOC team spoke with patient/family-plan is to d/c home with home health   History of left carotid endarterectomy 2015 CTA above Statin/antiplatelets as above   HTN Given CVA-permissive hypertension was allowed BP currently reasonable on amlodipine Chlorthalidone/valsartan to resume post discharge   HLD Statin-see above   DM-2 Resume metformin on discharge   BMI: Estimated body mass index is 27.44 kg/m as calculated from the following:   Height as of this encounter: 5\' 6"  (1.676 m).   Weight as of this encounter: 77.1 kg.    Discharge Diagnoses:  Principal Problem:   CVA (cerebral vascular accident) [facility] Active Problems:   Diabetes mellitus without complication (HCC)   Hypertension   Hyperlipidemia   Carotid artery disease (HCC)   Carotid stenosis   S/P carotid endarterectomy   Hypokalemia   Discharge Instructions:  Activity:  As tolerated with Full fall precautions use walker/cane & assistance as needed   Discharge Instructions     Ambulatory referral to Neurology   Complete by: As directed    Diet - low sodium heart healthy   Complete by: As directed    Diet Carb Modified   Complete by: As directed    Discharge instructions   Complete by: As directed    Follow with Primary MD  [facility] in 1-2 weeks  Please get a complete blood count and chemistry panel checked by your Primary MD at your next  PATIENT DETAILS Name: Kathleen Figueroa Age: 87 y.o. Sex: female Date of Birth: 1935-09-30 MRN: [id number]. Admitting Physician: Clydie Braun, MD QVZ:DGLOVFIEPP, [facility]  Admit Date: 11/12/2022 Discharge date: 11/13/2022  Recommendations for Outpatient Follow-up:  Follow up with PCP in 1-2 weeks Please obtain CMP/CBC in one week Please ensure follow up with Neurology:  Admitted From:  Home  Disposition: Home health   Discharge Condition: good  CODE STATUS:   Code Status: Full Code   Diet recommendation:  Diet Order             Diet - low sodium heart healthy           Diet Carb Modified           Diet Heart Room service appropriate? Yes; Fluid consistency: Thin  Diet effective now                    Brief Summary: Patient is a 87 y.o.  female with history of DM-2, HTN, prior carotid stenosis s/p left carotid endarterectomy 2015-who presented with dizziness/tingling in the right jaw-patient was found to have acute CVA and subsequently admitted to the hospitalist service.   Significant events: 10/3>> admit to [facility]   Significant studies: 10/3>> MRI brain: Acute infarct right temporal lobe. 10/4>> TSH: 1.006 10/4>> A1c 6.5 10/4>> LDL 145 10/4>> ESR: 4 10/4>>CTA head/neck:severe right V4 stenosis, severe left vertebral artry origin stenosis 10/4>>Echo:EF 60-65%   Significant microbiology data: 10/4>> COVID PCR: Negative 10/4>> respiratory virus panel: Negative   Procedures: None   Consults: Neurology  Brief Hospital Course: Headache with right-sided facial tingling/numbness/sweats/dizziness ?  Migraine Headache has resolved after getting Zofran this morning Doubt this is related to small punctate CVA Inflammatory markers not elevated-doubt GCA If episode recur-will need Neurology eval   Acute CVA Could be an incidental finding Workup as above Spoke with Dr Xu-ASA/Plavix x 3 weeks then Plavix alone Non compliant to  statin-counseled to resume statin as prior-PCP to recheck lipid panel in 3-4 months after statin compliance PT/OT recommended CIR-TOC team spoke with patient/family-plan is to d/c home with home health   History of left carotid endarterectomy 2015 CTA above Statin/antiplatelets as above   HTN Given CVA-permissive hypertension was allowed BP currently reasonable on amlodipine Chlorthalidone/valsartan to resume post discharge   HLD Statin-see above   DM-2 Resume metformin on discharge   BMI: Estimated body mass index is 27.44 kg/m as calculated from the following:   Height as of this encounter: 5\' 6"  (1.676 m).   Weight as of this encounter: 77.1 kg.    Discharge Diagnoses:  Principal Problem:   CVA (cerebral vascular accident) [facility] Active Problems:   Diabetes mellitus without complication (HCC)   Hypertension   Hyperlipidemia   Carotid artery disease (HCC)   Carotid stenosis   S/P carotid endarterectomy   Hypokalemia   Discharge Instructions:  Activity:  As tolerated with Full fall precautions use walker/cane & assistance as needed   Discharge Instructions     Ambulatory referral to Neurology   Complete by: As directed    Diet - low sodium heart healthy   Complete by: As directed    Diet Carb Modified   Complete by: As directed    Discharge instructions   Complete by: As directed    Follow with Primary MD  [facility] in 1-2 weeks  Please get a complete blood count and chemistry panel checked by your Primary MD at your next  tablet (320 mg total) by mouth daily.               Durable Medical Equipment  (From admission, onward)           Start     Ordered   11/13/22 1709  For home use only DME Walker rolling  Once       Question Answer Comment  Walker: With 5 Inch Wheels   Patient needs a walker to treat with the following condition Stroke (HCC)      11/13/22 1708            Follow-up Information     [facility] Follow up.   Specialty: Home Health Services Contact information: [address] [place] [address] [phone]         [facility] Schedule an appointment as soon as possible for a visit in 1 week(s).   Contact information: [address] [place] [address] [phone]         [facility] Follow up.    Specialty: Neurology Why: Office will call with date/time, If you dont hear from them,please give them a call Contact information: [address] [place] [address] [phone]               Allergies  Allergen Reactions   Aspirin Nausea Only    Higher dose     Other Procedures/Studies: ECHOCARDIOGRAM COMPLETE  Result Date: 11/13/2022    ECHOCARDIOGRAM REPORT   Patient Name:   Kathleen Figueroa Date of Exam: 11/13/2022 Medical Rec #:  [id number]      Height:       66.0 in Accession #:    [id number]     Weight:       170.0 lb Date of Birth:  [birth year]      BSA:          1.866 m Patient Age:    87 years       BP:           138/66 mmHg Patient Gender: F              HR:           67 bpm. Exam Location:  Inpatient Procedure: 2D Echo, Cardiac Doppler and Color Doppler Indications:    Stroke I63.9  History:        Patient has prior history of Echocardiogram examinations, most                 recent 05/07/2018. CAD, Stroke and TIA; Risk                 Factors:Hypertension, Diabetes and Dyslipidemia.  Sonographer:    Lucendia Herrlich RCS Referring Phys: [phone] [facility] [place] IMPRESSIONS  1. Left ventricular ejection fraction, by estimation, is 60 to 65%. The left ventricle has normal function. The left ventricle has no regional wall motion abnormalities. There is mild concentric left ventricular hypertrophy. Left ventricular diastolic parameters are consistent with Grade I diastolic dysfunction (impaired relaxation).  2. Right ventricular systolic function is normal. The right ventricular size is normal.  3. The mitral valve is normal in structure. No evidence of mitral valve regurgitation. No evidence of mitral stenosis.  4. The aortic valve is normal in structure. Aortic valve regurgitation is not visualized. No aortic stenosis is present.  5. Abdominal aorta is normal.  6. The inferior vena cava is normal  By: Tiburcio Pea M.D.   On: 11/12/2022 09:13     TODAY-DAY OF DISCHARGE:  Subjective:   Kathleen Figueroa today has no headache,no chest abdominal pain,no new weakness tingling or numbness, feels much better wants to go home today.   Objective:   Blood pressure (!) 112/48, pulse 80, temperature 98.4 F (36.9 C), temperature source Oral, resp. rate 15, height 5\' 6"  (1.676 m), weight 77.1 kg, SpO2 92%.  Intake/Output Summary (Last 24 hours) at 11/13/2022 1713 Last data filed at 11/13/2022 0500 Gross per 24 hour  Intake 731.17 ml  Output --  Net 731.17 ml   Filed Weights   11/12/22 0737  Weight: 77.1 kg    Exam: Awake Alert, Oriented *3, No new F.N deficits, Normal affect [place].AT,PERRAL Supple Neck,No JVD, No cervical lymphadenopathy appriciated.  Symmetrical Chest wall movement, Good air movement bilaterally, CTAB RRR,No Gallops,Rubs or new Murmurs, No Parasternal Heave +ve B.Sounds, Abd Soft, Non tender, No organomegaly appriciated, No rebound -guarding or rigidity. No Cyanosis, Clubbing or edema, No new Rash or bruise   PERTINENT RADIOLOGIC STUDIES: ECHOCARDIOGRAM COMPLETE  Result Date: 11/13/2022    ECHOCARDIOGRAM REPORT   Patient Name:   Kathleen Figueroa Date of Exam: 11/13/2022 Medical Rec #:  [id number]      Height:       66.0 in Accession #:    [id number]     Weight:       170.0 lb  Date of Birth:  Jul 09, 1935      BSA:          1.866 m Patient Age:    87 years       BP:           138/66 mmHg Patient Gender: F              HR:           67 bpm. Exam Location:  Inpatient Procedure: 2D Echo, Cardiac Doppler and Color Doppler Indications:    Stroke I63.9  History:        Patient has prior history of Echocardiogram examinations, most                 recent 05/07/2018. CAD, Stroke and TIA; Risk                 Factors:Hypertension, Diabetes and Dyslipidemia.  Sonographer:    Lucendia Herrlich RCS Referring Phys: [id number] [facility] [place] IMPRESSIONS  1. Left ventricular ejection fraction, by estimation, is 60 to 65%. The left ventricle has normal function. The left ventricle has no regional wall motion abnormalities. There is mild concentric left ventricular hypertrophy. Left ventricular diastolic parameters are consistent with Grade I diastolic dysfunction (impaired relaxation).  2. Right ventricular systolic function is normal. The right ventricular size is normal.  3. The mitral valve is normal in structure. No evidence of mitral valve regurgitation. No evidence of mitral stenosis.  4. The aortic valve is normal in structure. Aortic valve regurgitation is not visualized. No aortic stenosis is present.  5. Abdominal aorta is normal.  6. The inferior vena cava is normal in size with greater than 50% respiratory variability, suggesting right atrial pressure of 3 mmHg. FINDINGS  Left Ventricle: Left ventricular ejection fraction, by estimation, is 60 to 65%. The left ventricle has normal function. The left ventricle has no regional wall motion abnormalities. The left ventricular internal cavity size was normal in size. There is  mild concentric left ventricular hypertrophy. Left ventricular diastolic parameters are consistent  tablet (320 mg total) by mouth daily.               Durable Medical Equipment  (From admission, onward)           Start     Ordered   11/13/22 1709  For home use only DME Walker rolling  Once       Question Answer Comment  Walker: With 5 Inch Wheels   Patient needs a walker to treat with the following condition Stroke (HCC)      11/13/22 1708            Follow-up Information     [facility] Follow up.   Specialty: Home Health Services Contact information: [address] [place] [address] [phone]         [facility] Schedule an appointment as soon as possible for a visit in 1 week(s).   Contact information: [address] [place] [address] [phone]         [facility] Follow up.    Specialty: Neurology Why: Office will call with date/time, If you dont hear from them,please give them a call Contact information: [address] [place] [address] [phone]               Allergies  Allergen Reactions   Aspirin Nausea Only    Higher dose     Other Procedures/Studies: ECHOCARDIOGRAM COMPLETE  Result Date: 11/13/2022    ECHOCARDIOGRAM REPORT   Patient Name:   Kathleen Figueroa Date of Exam: 11/13/2022 Medical Rec #:  [id number]      Height:       66.0 in Accession #:    [id number]     Weight:       170.0 lb Date of Birth:  [birth year]      BSA:          1.866 m Patient Age:    87 years       BP:           138/66 mmHg Patient Gender: F              HR:           67 bpm. Exam Location:  Inpatient Procedure: 2D Echo, Cardiac Doppler and Color Doppler Indications:    Stroke I63.9  History:        Patient has prior history of Echocardiogram examinations, most                 recent 05/07/2018. CAD, Stroke and TIA; Risk                 Factors:Hypertension, Diabetes and Dyslipidemia.  Sonographer:    Lucendia Herrlich RCS Referring Phys: [phone] [facility] [place] IMPRESSIONS  1. Left ventricular ejection fraction, by estimation, is 60 to 65%. The left ventricle has normal function. The left ventricle has no regional wall motion abnormalities. There is mild concentric left ventricular hypertrophy. Left ventricular diastolic parameters are consistent with Grade I diastolic dysfunction (impaired relaxation).  2. Right ventricular systolic function is normal. The right ventricular size is normal.  3. The mitral valve is normal in structure. No evidence of mitral valve regurgitation. No evidence of mitral stenosis.  4. The aortic valve is normal in structure. Aortic valve regurgitation is not visualized. No aortic stenosis is present.  5. Abdominal aorta is normal.  6. The inferior vena cava is normal  tablet (320 mg total) by mouth daily.               Durable Medical Equipment  (From admission, onward)           Start     Ordered   11/13/22 1709  For home use only DME Walker rolling  Once       Question Answer Comment  Walker: With 5 Inch Wheels   Patient needs a walker to treat with the following condition Stroke (HCC)      11/13/22 1708            Follow-up Information     [facility] Follow up.   Specialty: Home Health Services Contact information: [address] [place] [address] [phone]         [facility] Schedule an appointment as soon as possible for a visit in 1 week(s).   Contact information: [address] [place] [address] [phone]         [facility] Follow up.    Specialty: Neurology Why: Office will call with date/time, If you dont hear from them,please give them a call Contact information: [address] [place] [address] [phone]               Allergies  Allergen Reactions   Aspirin Nausea Only    Higher dose     Other Procedures/Studies: ECHOCARDIOGRAM COMPLETE  Result Date: 11/13/2022    ECHOCARDIOGRAM REPORT   Patient Name:   Kathleen Figueroa Date of Exam: 11/13/2022 Medical Rec #:  [id number]      Height:       66.0 in Accession #:    [id number]     Weight:       170.0 lb Date of Birth:  [birth year]      BSA:          1.866 m Patient Age:    87 years       BP:           138/66 mmHg Patient Gender: F              HR:           67 bpm. Exam Location:  Inpatient Procedure: 2D Echo, Cardiac Doppler and Color Doppler Indications:    Stroke I63.9  History:        Patient has prior history of Echocardiogram examinations, most                 recent 05/07/2018. CAD, Stroke and TIA; Risk                 Factors:Hypertension, Diabetes and Dyslipidemia.  Sonographer:    Lucendia Herrlich RCS Referring Phys: [phone] [facility] [place] IMPRESSIONS  1. Left ventricular ejection fraction, by estimation, is 60 to 65%. The left ventricle has normal function. The left ventricle has no regional wall motion abnormalities. There is mild concentric left ventricular hypertrophy. Left ventricular diastolic parameters are consistent with Grade I diastolic dysfunction (impaired relaxation).  2. Right ventricular systolic function is normal. The right ventricular size is normal.  3. The mitral valve is normal in structure. No evidence of mitral valve regurgitation. No evidence of mitral stenosis.  4. The aortic valve is normal in structure. Aortic valve regurgitation is not visualized. No aortic stenosis is present.  5. Abdominal aorta is normal.  6. The inferior vena cava is normal  By: Tiburcio Pea M.D.   On: 11/12/2022 09:13     TODAY-DAY OF DISCHARGE:  Subjective:   Kathleen Figueroa today has no headache,no chest abdominal pain,no new weakness tingling or numbness, feels much better wants to go home today.   Objective:   Blood pressure (!) 112/48, pulse 80, temperature 98.4 F (36.9 C), temperature source Oral, resp. rate 15, height 5\' 6"  (1.676 m), weight 77.1 kg, SpO2 92%.  Intake/Output Summary (Last 24 hours) at 11/13/2022 1713 Last data filed at 11/13/2022 0500 Gross per 24 hour  Intake 731.17 ml  Output --  Net 731.17 ml   Filed Weights   11/12/22 0737  Weight: 77.1 kg    Exam: Awake Alert, Oriented *3, No new F.N deficits, Normal affect [place].AT,PERRAL Supple Neck,No JVD, No cervical lymphadenopathy appriciated.  Symmetrical Chest wall movement, Good air movement bilaterally, CTAB RRR,No Gallops,Rubs or new Murmurs, No Parasternal Heave +ve B.Sounds, Abd Soft, Non tender, No organomegaly appriciated, No rebound -guarding or rigidity. No Cyanosis, Clubbing or edema, No new Rash or bruise   PERTINENT RADIOLOGIC STUDIES: ECHOCARDIOGRAM COMPLETE  Result Date: 11/13/2022    ECHOCARDIOGRAM REPORT   Patient Name:   Kathleen Figueroa Date of Exam: 11/13/2022 Medical Rec #:  [id number]      Height:       66.0 in Accession #:    [id number]     Weight:       170.0 lb  Date of Birth:  Jul 09, 1935      BSA:          1.866 m Patient Age:    87 years       BP:           138/66 mmHg Patient Gender: F              HR:           67 bpm. Exam Location:  Inpatient Procedure: 2D Echo, Cardiac Doppler and Color Doppler Indications:    Stroke I63.9  History:        Patient has prior history of Echocardiogram examinations, most                 recent 05/07/2018. CAD, Stroke and TIA; Risk                 Factors:Hypertension, Diabetes and Dyslipidemia.  Sonographer:    Lucendia Herrlich RCS Referring Phys: [id number] [facility] [place] IMPRESSIONS  1. Left ventricular ejection fraction, by estimation, is 60 to 65%. The left ventricle has normal function. The left ventricle has no regional wall motion abnormalities. There is mild concentric left ventricular hypertrophy. Left ventricular diastolic parameters are consistent with Grade I diastolic dysfunction (impaired relaxation).  2. Right ventricular systolic function is normal. The right ventricular size is normal.  3. The mitral valve is normal in structure. No evidence of mitral valve regurgitation. No evidence of mitral stenosis.  4. The aortic valve is normal in structure. Aortic valve regurgitation is not visualized. No aortic stenosis is present.  5. Abdominal aorta is normal.  6. The inferior vena cava is normal in size with greater than 50% respiratory variability, suggesting right atrial pressure of 3 mmHg. FINDINGS  Left Ventricle: Left ventricular ejection fraction, by estimation, is 60 to 65%. The left ventricle has normal function. The left ventricle has no regional wall motion abnormalities. The left ventricular internal cavity size was normal in size. There is  mild concentric left ventricular hypertrophy. Left ventricular diastolic parameters are consistent  By: Tiburcio Pea M.D.   On: 11/12/2022 09:13     TODAY-DAY OF DISCHARGE:  Subjective:   Kathleen Figueroa today has no headache,no chest abdominal pain,no new weakness tingling or numbness, feels much better wants to go home today.   Objective:   Blood pressure (!) 112/48, pulse 80, temperature 98.4 F (36.9 C), temperature source Oral, resp. rate 15, height 5\' 6"  (1.676 m), weight 77.1 kg, SpO2 92%.  Intake/Output Summary (Last 24 hours) at 11/13/2022 1713 Last data filed at 11/13/2022 0500 Gross per 24 hour  Intake 731.17 ml  Output --  Net 731.17 ml   Filed Weights   11/12/22 0737  Weight: 77.1 kg    Exam: Awake Alert, Oriented *3, No new F.N deficits, Normal affect [place].AT,PERRAL Supple Neck,No JVD, No cervical lymphadenopathy appriciated.  Symmetrical Chest wall movement, Good air movement bilaterally, CTAB RRR,No Gallops,Rubs or new Murmurs, No Parasternal Heave +ve B.Sounds, Abd Soft, Non tender, No organomegaly appriciated, No rebound -guarding or rigidity. No Cyanosis, Clubbing or edema, No new Rash or bruise   PERTINENT RADIOLOGIC STUDIES: ECHOCARDIOGRAM COMPLETE  Result Date: 11/13/2022    ECHOCARDIOGRAM REPORT   Patient Name:   Kathleen Figueroa Date of Exam: 11/13/2022 Medical Rec #:  [id number]      Height:       66.0 in Accession #:    [id number]     Weight:       170.0 lb  Date of Birth:  Jul 09, 1935      BSA:          1.866 m Patient Age:    87 years       BP:           138/66 mmHg Patient Gender: F              HR:           67 bpm. Exam Location:  Inpatient Procedure: 2D Echo, Cardiac Doppler and Color Doppler Indications:    Stroke I63.9  History:        Patient has prior history of Echocardiogram examinations, most                 recent 05/07/2018. CAD, Stroke and TIA; Risk                 Factors:Hypertension, Diabetes and Dyslipidemia.  Sonographer:    Lucendia Herrlich RCS Referring Phys: [id number] [facility] [place] IMPRESSIONS  1. Left ventricular ejection fraction, by estimation, is 60 to 65%. The left ventricle has normal function. The left ventricle has no regional wall motion abnormalities. There is mild concentric left ventricular hypertrophy. Left ventricular diastolic parameters are consistent with Grade I diastolic dysfunction (impaired relaxation).  2. Right ventricular systolic function is normal. The right ventricular size is normal.  3. The mitral valve is normal in structure. No evidence of mitral valve regurgitation. No evidence of mitral stenosis.  4. The aortic valve is normal in structure. Aortic valve regurgitation is not visualized. No aortic stenosis is present.  5. Abdominal aorta is normal.  6. The inferior vena cava is normal in size with greater than 50% respiratory variability, suggesting right atrial pressure of 3 mmHg. FINDINGS  Left Ventricle: Left ventricular ejection fraction, by estimation, is 60 to 65%. The left ventricle has normal function. The left ventricle has no regional wall motion abnormalities. The left ventricular internal cavity size was normal in size. There is  mild concentric left ventricular hypertrophy. Left ventricular diastolic parameters are consistent

## 2022-11-13 NOTE — Evaluation (Signed)
Occupational Therapy Evaluation Patient Details Name: Kathleen Figueroa MRN: [id number] DOB: 1935/11/17 Today's Date: 11/13/2022   History of Present Illness 87 y.o. female who initially presented to the [facility] for evaluation of dizziness, numbness and tingling of the right side of her face. MRI revealed an punctate acute infarct of the R temporal lobe. past medical history of hypertension, diabetes, carotid stenosis s/p left carotid endarterectomy and TIA   Clinical Impression   Pt admitted for above, presents with unsteady balance impacting her gait to perform her typical ADLs/iADLs. Pt vision noted to be WFL, UB Figueroa WFL, she is completing ADLs with CGA to setup assist but needs min A for transfers due LOBs. Discussed with pt daughter DC recs and she is hopeful for [facility], informed them of the risks of going home without supervision at this time due to impaired balance. Daughter would like time to consider alternative options. Patient has the potential to reach Mod I and demos the ability to tolerate 3 hours of therapy. Pt would benefit from an intensive rehab program to help maximize functional independence.       If plan is discharge home, recommend the following: A little help with walking and/or transfers;Other (comment) (supervision due to fall risk)    Functional Status Assessment  Patient has had a recent decline in their functional status and demonstrates the ability to make significant improvements in function in a reasonable and predictable amount of time.  Equipment Recommendations  None recommended by OT;Tub/shower seat    Recommendations for Other Services Rehab consult     Precautions / Restrictions Precautions Precautions: Fall Precaution Comments: per daughter 3 falls in just over the last year Restrictions Weight Bearing Restrictions: No      Mobility Bed Mobility               General bed mobility comments: up in recliner     Transfers Overall transfer level: Needs assistance Equipment used: None Transfers: Sit to/from Stand Sit to Stand: Contact guard assist                  Balance Overall balance assessment: Needs assistance Sitting-balance support: Feet supported Sitting balance-Leahy Scale: Good Sitting balance - Comments: in recliner     Standing balance-Leahy Scale: Fair Standing balance comment: moderately unsteady without UE support, feet tend to scissor                           ADL either performed or assessed with clinical judgement   ADL Overall ADL's : Needs assistance/impaired Eating/Feeding: Independent;Sitting   Grooming: Standing;Contact guard assist;Oral care   Upper Body Bathing: Sitting;Set up   Lower Body Bathing: Sitting/lateral leans;Set up   Upper Body Dressing : Sitting;Set up   Lower Body Dressing: Set up;Sitting/lateral leans   Toilet Transfer: Minimal assistance;Ambulation Toilet Transfer Details (indicate cue type and reason): no AD Toileting- Clothing Manipulation and Hygiene: Minimal assistance Toileting - Clothing Manipulation Details (indicate cue type and reason): for balance     Functional mobility during ADLs: Minimal assistance General ADL Comments: Pt noted to be unsteady throughout gait trial, feet tend to scissor and loses balance posteriorly or to the R     Vision Baseline Vision/History: 0 No visual deficits Patient Visual Report: No change from baseline Vision Assessment?: Yes Eye Alignment: Within Functional Limits Ocular Range of Motion: Within Functional Limits Alignment/Gaze Preference: Within Defined Limits Tracking/Visual Pursuits: Able to track stimulus in all  quads without difficulty Visual Fields: No apparent deficits Diplopia Assessment: Other (comment) (WFL) Depth Perception:  Leonarda Salon) Additional Comments: Daughter reports that at home the pt has a hx of undershooting/overshooting and had periods in which she  could not see as clearly. During visual assessment no abnormalities noted.     Perception         Praxis Praxis: WFL       Pertinent Vitals/Pain Pain Assessment Pain Assessment: No/denies pain     Extremity/Trunk Assessment Upper Extremity Assessment Upper Extremity Assessment: Defer to OT evaluation   Lower Extremity Assessment Lower Extremity Assessment: Generalized weakness   Cervical / Trunk Assessment Cervical / Trunk Assessment: Normal   Communication Communication Communication: Hearing impairment Cueing Techniques: Verbal cues;Tactile cues   Cognition Arousal: Alert Behavior During Therapy: WFL for tasks assessed/performed Overall Cognitive Status: Within Functional Limits for tasks assessed                                 General Comments: Per family pt cognition back to baseline     General Comments  VSS on RA. Discussed with pt and family the risk of going home with current functional mobility level. Family expresses concern at how the pt was losing balance in the hallway and needed assistance to correct.    Exercises     Shoulder Instructions      Home Living Family/patient expects to be discharged to:: Private residence Living Arrangements: Children Available Help at Discharge: Family;Available PRN/intermittently (both of them work) Type of Home: Apartment Home Access: Level entry     Home Layout: One level     Bathroom Shower/Tub: Chief Strategy Officer: Standard     Home Equipment: Cane - single point;Cane - quad   Additional Comments: works as a Emergency planning/management officer Prior Level of Function : Independent/Modified Independent             Mobility Comments: ind; using SPC in the community ADLs Comments: ind        OT Problem List: Impaired balance (sitting and/or standing)      OT Treatment/Interventions: Self-care/ADL training;Therapeutic exercise;Therapeutic  activities;Patient/family education;Balance training    OT Goals(Current goals can be found in the care plan section) Acute Rehab OT Goals Patient Stated Goal: To go home OT Goal Formulation: With patient/family Time For Goal Achievement: 11/27/22 Potential to Achieve Goals: Good ADL Goals Pt Will Perform Grooming: standing;with supervision Pt Will Perform Upper Body Dressing: Independently;sitting Pt Will Perform Lower Body Dressing: with supervision;sit to/from stand Pt Will Transfer to Toilet: with supervision;ambulating Pt Will Perform Toileting - Clothing Manipulation and hygiene: with supervision;sit to/from stand  OT Frequency: Min 1X/week    Co-evaluation              AM-PAC OT "6 Clicks" Daily Activity     Outcome Measure Help from another person eating meals?: None Help from another person taking care of personal grooming?: A Little Help from another person toileting, which includes using toliet, bedpan, or urinal?: A Little Help from another person bathing (including washing, rinsing, drying)?: A Little Help from another person to put on and taking off regular upper body clothing?: A Little Help from another person to put on and taking off regular lower body clothing?: A Little 6 Click Score: 19   End of Session Equipment Utilized During Treatment: Gait belt Nurse Communication: Mobility status  Activity Tolerance: Patient tolerated treatment well Patient left: in chair;with call bell/phone within reach;with family/visitor present  OT Visit Diagnosis: Unsteadiness on feet (R26.81);Other abnormalities of gait and mobility (R26.89)                Time: 4098-1191 OT Time Calculation (min): 32 min Charges:  OT General Charges $OT Visit: 1 Visit OT Evaluation $OT Eval Low Complexity: 1 Low OT Treatments $Therapeutic Activity: 8-22 mins  11/13/2022  AB, OTR/L  Acute Rehabilitation Services  Office: [phone]   Tristan Schroeder 11/13/2022, 2:42 PM

## 2022-11-13 NOTE — Evaluation (Signed)
Physical Therapy Evaluation Patient Details Name: Kathleen Figueroa MRN: [id number] DOB: 07/03/1935 Today's Date: 11/13/2022  History of Present Illness  87 y.o. female who initially presented to the [facility] ED for evaluation of dizziness, numbness and tingling of the right side of her face. MRI revealed an punctate acute infarct of the R temporal lobe. past medical history of hypertension, diabetes, carotid stenosis s/p left carotid endarterectomy and TIA   Clinical Impression  Pt in recliner upon arrival with family present. Pt agreeable to PT eval. Pt presents to therapy session below functional mobility baseline with impaired dynamic balance. Pt has had multiple falls over the past year and has noticed a decrease in balance and strength. Pt is a high falls risk with a DGI score of 12. Pt needed MinA to correct LOB while performing dynamic balance activities and multiple standing rest breaks due to fatigue. Pt has decreased safety awareness and difficulty navigating obstacles in the environment. Pt would benefit from acute skilled PT to address functional impairments. Recommending post-acute >3 hrs to return to previous level of independence. Acute PT to follow.          If plan is discharge home, recommend the following: A little help with walking and/or transfers;A little help with bathing/dressing/bathroom;Help with stairs or ramp for entrance     Equipment Recommendations Rolling walker (2 wheels)  Recommendations for Other Services       Functional Status Assessment Patient has had a recent decline in their functional status and demonstrates the ability to make significant improvements in function in a reasonable and predictable amount of time.     Precautions / Restrictions Precautions Precautions: Fall Precaution Comments: per daughter 3 falls in just over the last year Restrictions Weight Bearing Restrictions: No      Mobility  Bed Mobility    General bed  mobility comments: in recliner upon arrival, returned to recliner    Transfers Overall transfer level: Needs assistance Equipment used: Rolling walker (2 wheels), None (x2 w/ RW, x1 w/ no AD) Transfers: Sit to/from Stand Sit to Stand: Contact guard assist        General transfer comment: CGA for safety    Ambulation/Gait Ambulation/Gait assistance: Min assist Gait Distance (Feet): 150 Feet (x150, x20) Assistive device: Rolling walker (2 wheels) Gait Pattern/deviations: Step-through pattern, Decreased stride length, Drifts right/left Gait velocity: dec     General Gait Details: Pt varies gait speed when ambulating, poor safety awareness and difficulty navigating obstacles. MinA for occasional LOB when walking preferred speed. Cues to stay close to RW and to avoid obstacles, MinA to assist with RW management        Balance Overall balance assessment: Needs assistance Sitting-balance support: Feet supported Sitting balance-Leahy Scale: Good Sitting balance - Comments: in recliner     Standing balance-Leahy Scale: Fair Standing balance comment: moderately unsteady without UE support in static standing        Standardized Balance Assessment Standardized Balance Assessment : Dynamic Gait Index   Dynamic Gait Index Level Surface: Moderate Impairment Change in Gait Speed: Mild Impairment Gait with Horizontal Head Turns: Moderate Impairment Gait with Vertical Head Turns: Moderate Impairment Gait and Pivot Turn: Mild Impairment Step Over Obstacle: Mild Impairment Step Around Obstacles: Mild Impairment Steps: Moderate Impairment Total Score: 12      Coordination Testing   Normal heel to shin and rapid alternating movements   Pertinent Vitals/Pain Pain Assessment Pain Assessment: No/denies pain    Home Living Family/patient expects to be  discharged to:: Private residence Living Arrangements: Children Available Help at Discharge: Family;Available PRN/intermittently  (both of them work) Type of Home: Apartment Home Access: Level entry       Home Layout: One level Home Equipment: Cane - single point;Cane - quad Additional Comments: works as a Artist Prior Level of Function : Independent/Modified Independent             Mobility Comments: ind; using SPC in the community ADLs Comments: ind     Extremity/Trunk Assessment   Upper Extremity Assessment Upper Extremity Assessment: Defer to OT evaluation    Lower Extremity Assessment Lower Extremity Assessment: Generalized weakness    Cervical / Trunk Assessment Cervical / Trunk Assessment: Normal  Communication   Communication Communication: Hearing impairment Cueing Techniques: Verbal cues;Tactile cues  Cognition Arousal: Alert Behavior During Therapy: WFL for tasks assessed/performed Overall Cognitive Status: Within Functional Limits for tasks assessed      General Comments: Per family pt cognition back to baseline        General Comments General comments (skin integrity, edema, etc.): VSS on RA. Discussed with pt and family the risk of going home with current functional mobility level. Family expresses concern at how the pt was losing balance in the hallway and needed assistance to correct.        Assessment/Plan    PT Assessment Patient needs continued PT services  PT Problem List Decreased strength;Decreased activity tolerance;Decreased balance;Decreased mobility;Decreased safety awareness;Decreased knowledge of use of DME       PT Treatment Interventions DME instruction;Neuromuscular re-education;Gait training;Stair training;Functional mobility training;Therapeutic activities;Therapeutic exercise;Balance training;Patient/family education    PT Goals (Current goals can be found in the Care Plan section)  Acute Rehab PT Goals Patient Stated Goal: to get better PT Goal Formulation: With patient/family Time For Goal Achievement:  11/27/22 Potential to Achieve Goals: Good Additional Goals Additional Goal #1: Pt will score equal to or greater than 16/24 on DGI    Frequency Min 1X/week        AM-PAC PT "6 Clicks" Mobility  Outcome Measure Help needed turning from your back to your side while in a flat bed without using bedrails?: A Little Help needed moving from lying on your back to sitting on the side of a flat bed without using bedrails?: A Little Help needed moving to and from a bed to a chair (including a wheelchair)?: A Little Help needed standing up from a chair using your arms (e.g., wheelchair or bedside chair)?: A Little Help needed to walk in hospital room?: A Little Help needed climbing 3-5 steps with a railing? : A Lot 6 Click Score: 17    End of Session Equipment Utilized During Treatment: Gait belt Activity Tolerance: Patient tolerated treatment well Patient left: in chair;with call bell/phone within reach;with family/visitor present (alarm off upon arrival, pt and family agrees to call nursing for mobility) Nurse Communication: Mobility status PT Visit Diagnosis: Unsteadiness on feet (R26.81);Repeated falls (R29.6);Muscle weakness (generalized) (M62.81);Difficulty in walking, not elsewhere classified (R26.2)    Time: 3244-0102 PT Time Calculation (min) (ACUTE ONLY): 37 min   Charges:   PT Evaluation $PT Eval Low Complexity: 1 Low PT Treatments $Gait Training: 8-22 mins PT General Charges $$ ACUTE PT VISIT: 1 Visit       Hilton Cork, PT, DPT Secure Chat Preferred  Rehab Office [phone]   Arturo Morton Brion Aliment 11/13/2022, 2:57 PM

## 2022-11-17 DIAGNOSIS — E785 Hyperlipidemia, unspecified: Secondary | ICD-10-CM | POA: Diagnosis not present

## 2022-11-17 DIAGNOSIS — Z87891 Personal history of nicotine dependence: Secondary | ICD-10-CM | POA: Diagnosis not present

## 2022-11-17 DIAGNOSIS — E876 Hypokalemia: Secondary | ICD-10-CM | POA: Diagnosis not present

## 2022-11-17 DIAGNOSIS — E119 Type 2 diabetes mellitus without complications: Secondary | ICD-10-CM | POA: Diagnosis not present

## 2022-11-17 DIAGNOSIS — I10 Essential (primary) hypertension: Secondary | ICD-10-CM | POA: Diagnosis not present

## 2022-11-17 DIAGNOSIS — H538 Other visual disturbances: Secondary | ICD-10-CM | POA: Diagnosis not present

## 2022-11-17 DIAGNOSIS — I69398 Other sequelae of cerebral infarction: Secondary | ICD-10-CM | POA: Diagnosis not present

## 2022-11-17 DIAGNOSIS — R42 Dizziness and giddiness: Secondary | ICD-10-CM | POA: Diagnosis not present

## 2022-11-17 DIAGNOSIS — M1990 Unspecified osteoarthritis, unspecified site: Secondary | ICD-10-CM | POA: Diagnosis not present

## 2022-11-18 DIAGNOSIS — E876 Hypokalemia: Secondary | ICD-10-CM | POA: Diagnosis not present

## 2022-11-18 DIAGNOSIS — Z87891 Personal history of nicotine dependence: Secondary | ICD-10-CM | POA: Diagnosis not present

## 2022-11-18 DIAGNOSIS — M1990 Unspecified osteoarthritis, unspecified site: Secondary | ICD-10-CM | POA: Diagnosis not present

## 2022-11-18 DIAGNOSIS — I69398 Other sequelae of cerebral infarction: Secondary | ICD-10-CM | POA: Diagnosis not present

## 2022-11-18 DIAGNOSIS — I10 Essential (primary) hypertension: Secondary | ICD-10-CM | POA: Diagnosis not present

## 2022-11-18 DIAGNOSIS — H538 Other visual disturbances: Secondary | ICD-10-CM | POA: Diagnosis not present

## 2022-11-18 DIAGNOSIS — E119 Type 2 diabetes mellitus without complications: Secondary | ICD-10-CM | POA: Diagnosis not present

## 2022-11-18 DIAGNOSIS — R42 Dizziness and giddiness: Secondary | ICD-10-CM | POA: Diagnosis not present

## 2022-11-18 DIAGNOSIS — E785 Hyperlipidemia, unspecified: Secondary | ICD-10-CM | POA: Diagnosis not present

## 2022-11-19 DIAGNOSIS — I69398 Other sequelae of cerebral infarction: Secondary | ICD-10-CM | POA: Diagnosis not present

## 2022-11-19 DIAGNOSIS — Z6823 Body mass index (BMI) 23.0-23.9, adult: Secondary | ICD-10-CM | POA: Diagnosis not present

## 2022-11-19 DIAGNOSIS — H538 Other visual disturbances: Secondary | ICD-10-CM | POA: Diagnosis not present

## 2022-11-19 DIAGNOSIS — E876 Hypokalemia: Secondary | ICD-10-CM | POA: Diagnosis not present

## 2022-11-19 DIAGNOSIS — M1990 Unspecified osteoarthritis, unspecified site: Secondary | ICD-10-CM | POA: Diagnosis not present

## 2022-11-19 DIAGNOSIS — E119 Type 2 diabetes mellitus without complications: Secondary | ICD-10-CM | POA: Diagnosis not present

## 2022-11-19 DIAGNOSIS — I10 Essential (primary) hypertension: Secondary | ICD-10-CM | POA: Diagnosis not present

## 2022-11-19 DIAGNOSIS — Z87891 Personal history of nicotine dependence: Secondary | ICD-10-CM | POA: Diagnosis not present

## 2022-11-19 DIAGNOSIS — R42 Dizziness and giddiness: Secondary | ICD-10-CM | POA: Diagnosis not present

## 2022-11-19 DIAGNOSIS — I639 Cerebral infarction, unspecified: Secondary | ICD-10-CM | POA: Diagnosis not present

## 2022-11-19 DIAGNOSIS — E785 Hyperlipidemia, unspecified: Secondary | ICD-10-CM | POA: Diagnosis not present

## 2022-11-23 DIAGNOSIS — Z87891 Personal history of nicotine dependence: Secondary | ICD-10-CM | POA: Diagnosis not present

## 2022-11-23 DIAGNOSIS — I10 Essential (primary) hypertension: Secondary | ICD-10-CM | POA: Diagnosis not present

## 2022-11-23 DIAGNOSIS — E785 Hyperlipidemia, unspecified: Secondary | ICD-10-CM | POA: Diagnosis not present

## 2022-11-23 DIAGNOSIS — E876 Hypokalemia: Secondary | ICD-10-CM | POA: Diagnosis not present

## 2022-11-23 DIAGNOSIS — M1990 Unspecified osteoarthritis, unspecified site: Secondary | ICD-10-CM | POA: Diagnosis not present

## 2022-11-23 DIAGNOSIS — I69398 Other sequelae of cerebral infarction: Secondary | ICD-10-CM | POA: Diagnosis not present

## 2022-11-23 DIAGNOSIS — R42 Dizziness and giddiness: Secondary | ICD-10-CM | POA: Diagnosis not present

## 2022-11-23 DIAGNOSIS — H538 Other visual disturbances: Secondary | ICD-10-CM | POA: Diagnosis not present

## 2022-11-23 DIAGNOSIS — E119 Type 2 diabetes mellitus without complications: Secondary | ICD-10-CM | POA: Diagnosis not present

## 2022-11-24 DIAGNOSIS — E119 Type 2 diabetes mellitus without complications: Secondary | ICD-10-CM | POA: Diagnosis not present

## 2022-11-24 DIAGNOSIS — M1990 Unspecified osteoarthritis, unspecified site: Secondary | ICD-10-CM | POA: Diagnosis not present

## 2022-11-24 DIAGNOSIS — I69398 Other sequelae of cerebral infarction: Secondary | ICD-10-CM | POA: Diagnosis not present

## 2022-11-24 DIAGNOSIS — E785 Hyperlipidemia, unspecified: Secondary | ICD-10-CM | POA: Diagnosis not present

## 2022-11-24 DIAGNOSIS — I10 Essential (primary) hypertension: Secondary | ICD-10-CM | POA: Diagnosis not present

## 2022-11-24 DIAGNOSIS — E876 Hypokalemia: Secondary | ICD-10-CM | POA: Diagnosis not present

## 2022-11-24 DIAGNOSIS — H538 Other visual disturbances: Secondary | ICD-10-CM | POA: Diagnosis not present

## 2022-11-24 DIAGNOSIS — Z87891 Personal history of nicotine dependence: Secondary | ICD-10-CM | POA: Diagnosis not present

## 2022-11-24 DIAGNOSIS — R42 Dizziness and giddiness: Secondary | ICD-10-CM | POA: Diagnosis not present

## 2022-11-27 DIAGNOSIS — E785 Hyperlipidemia, unspecified: Secondary | ICD-10-CM | POA: Diagnosis not present

## 2022-11-27 DIAGNOSIS — E119 Type 2 diabetes mellitus without complications: Secondary | ICD-10-CM | POA: Diagnosis not present

## 2022-11-27 DIAGNOSIS — H538 Other visual disturbances: Secondary | ICD-10-CM | POA: Diagnosis not present

## 2022-11-27 DIAGNOSIS — M1990 Unspecified osteoarthritis, unspecified site: Secondary | ICD-10-CM | POA: Diagnosis not present

## 2022-11-27 DIAGNOSIS — E876 Hypokalemia: Secondary | ICD-10-CM | POA: Diagnosis not present

## 2022-11-27 DIAGNOSIS — I69398 Other sequelae of cerebral infarction: Secondary | ICD-10-CM | POA: Diagnosis not present

## 2022-11-27 DIAGNOSIS — I10 Essential (primary) hypertension: Secondary | ICD-10-CM | POA: Diagnosis not present

## 2022-11-27 DIAGNOSIS — Z87891 Personal history of nicotine dependence: Secondary | ICD-10-CM | POA: Diagnosis not present

## 2022-11-27 DIAGNOSIS — R42 Dizziness and giddiness: Secondary | ICD-10-CM | POA: Diagnosis not present

## 2022-11-30 DIAGNOSIS — E119 Type 2 diabetes mellitus without complications: Secondary | ICD-10-CM | POA: Diagnosis not present

## 2022-11-30 DIAGNOSIS — H538 Other visual disturbances: Secondary | ICD-10-CM | POA: Diagnosis not present

## 2022-11-30 DIAGNOSIS — I69398 Other sequelae of cerebral infarction: Secondary | ICD-10-CM | POA: Diagnosis not present

## 2022-11-30 DIAGNOSIS — I10 Essential (primary) hypertension: Secondary | ICD-10-CM | POA: Diagnosis not present

## 2022-11-30 DIAGNOSIS — E876 Hypokalemia: Secondary | ICD-10-CM | POA: Diagnosis not present

## 2022-11-30 DIAGNOSIS — R42 Dizziness and giddiness: Secondary | ICD-10-CM | POA: Diagnosis not present

## 2022-11-30 DIAGNOSIS — E785 Hyperlipidemia, unspecified: Secondary | ICD-10-CM | POA: Diagnosis not present

## 2022-11-30 DIAGNOSIS — M1990 Unspecified osteoarthritis, unspecified site: Secondary | ICD-10-CM | POA: Diagnosis not present

## 2022-11-30 DIAGNOSIS — Z87891 Personal history of nicotine dependence: Secondary | ICD-10-CM | POA: Diagnosis not present

## 2022-12-02 DIAGNOSIS — E876 Hypokalemia: Secondary | ICD-10-CM | POA: Diagnosis not present

## 2022-12-02 DIAGNOSIS — M1990 Unspecified osteoarthritis, unspecified site: Secondary | ICD-10-CM | POA: Diagnosis not present

## 2022-12-02 DIAGNOSIS — I10 Essential (primary) hypertension: Secondary | ICD-10-CM | POA: Diagnosis not present

## 2022-12-02 DIAGNOSIS — E119 Type 2 diabetes mellitus without complications: Secondary | ICD-10-CM | POA: Diagnosis not present

## 2022-12-02 DIAGNOSIS — Z87891 Personal history of nicotine dependence: Secondary | ICD-10-CM | POA: Diagnosis not present

## 2022-12-02 DIAGNOSIS — E785 Hyperlipidemia, unspecified: Secondary | ICD-10-CM | POA: Diagnosis not present

## 2022-12-02 DIAGNOSIS — R42 Dizziness and giddiness: Secondary | ICD-10-CM | POA: Diagnosis not present

## 2022-12-02 DIAGNOSIS — I69398 Other sequelae of cerebral infarction: Secondary | ICD-10-CM | POA: Diagnosis not present

## 2022-12-02 DIAGNOSIS — H538 Other visual disturbances: Secondary | ICD-10-CM | POA: Diagnosis not present

## 2022-12-07 DIAGNOSIS — H538 Other visual disturbances: Secondary | ICD-10-CM | POA: Diagnosis not present

## 2022-12-07 DIAGNOSIS — E876 Hypokalemia: Secondary | ICD-10-CM | POA: Diagnosis not present

## 2022-12-07 DIAGNOSIS — I10 Essential (primary) hypertension: Secondary | ICD-10-CM | POA: Diagnosis not present

## 2022-12-07 DIAGNOSIS — Z87891 Personal history of nicotine dependence: Secondary | ICD-10-CM | POA: Diagnosis not present

## 2022-12-07 DIAGNOSIS — I69398 Other sequelae of cerebral infarction: Secondary | ICD-10-CM | POA: Diagnosis not present

## 2022-12-07 DIAGNOSIS — R42 Dizziness and giddiness: Secondary | ICD-10-CM | POA: Diagnosis not present

## 2022-12-07 DIAGNOSIS — M1990 Unspecified osteoarthritis, unspecified site: Secondary | ICD-10-CM | POA: Diagnosis not present

## 2022-12-07 DIAGNOSIS — E119 Type 2 diabetes mellitus without complications: Secondary | ICD-10-CM | POA: Diagnosis not present

## 2022-12-07 DIAGNOSIS — E785 Hyperlipidemia, unspecified: Secondary | ICD-10-CM | POA: Diagnosis not present

## 2022-12-11 DIAGNOSIS — M1990 Unspecified osteoarthritis, unspecified site: Secondary | ICD-10-CM | POA: Diagnosis not present

## 2022-12-11 DIAGNOSIS — E785 Hyperlipidemia, unspecified: Secondary | ICD-10-CM | POA: Diagnosis not present

## 2022-12-11 DIAGNOSIS — R42 Dizziness and giddiness: Secondary | ICD-10-CM | POA: Diagnosis not present

## 2022-12-11 DIAGNOSIS — I69398 Other sequelae of cerebral infarction: Secondary | ICD-10-CM | POA: Diagnosis not present

## 2022-12-11 DIAGNOSIS — I10 Essential (primary) hypertension: Secondary | ICD-10-CM | POA: Diagnosis not present

## 2022-12-11 DIAGNOSIS — H538 Other visual disturbances: Secondary | ICD-10-CM | POA: Diagnosis not present

## 2022-12-11 DIAGNOSIS — Z87891 Personal history of nicotine dependence: Secondary | ICD-10-CM | POA: Diagnosis not present

## 2022-12-11 DIAGNOSIS — E119 Type 2 diabetes mellitus without complications: Secondary | ICD-10-CM | POA: Diagnosis not present

## 2022-12-11 DIAGNOSIS — E876 Hypokalemia: Secondary | ICD-10-CM | POA: Diagnosis not present

## 2022-12-16 DIAGNOSIS — E119 Type 2 diabetes mellitus without complications: Secondary | ICD-10-CM | POA: Diagnosis not present

## 2022-12-16 DIAGNOSIS — I10 Essential (primary) hypertension: Secondary | ICD-10-CM | POA: Diagnosis not present

## 2022-12-16 DIAGNOSIS — M1990 Unspecified osteoarthritis, unspecified site: Secondary | ICD-10-CM | POA: Diagnosis not present

## 2022-12-16 DIAGNOSIS — E876 Hypokalemia: Secondary | ICD-10-CM | POA: Diagnosis not present

## 2022-12-16 DIAGNOSIS — Z87891 Personal history of nicotine dependence: Secondary | ICD-10-CM | POA: Diagnosis not present

## 2022-12-16 DIAGNOSIS — E785 Hyperlipidemia, unspecified: Secondary | ICD-10-CM | POA: Diagnosis not present

## 2022-12-16 DIAGNOSIS — H538 Other visual disturbances: Secondary | ICD-10-CM | POA: Diagnosis not present

## 2022-12-16 DIAGNOSIS — I69398 Other sequelae of cerebral infarction: Secondary | ICD-10-CM | POA: Diagnosis not present

## 2022-12-16 DIAGNOSIS — R42 Dizziness and giddiness: Secondary | ICD-10-CM | POA: Diagnosis not present

## 2022-12-21 DIAGNOSIS — E876 Hypokalemia: Secondary | ICD-10-CM | POA: Diagnosis not present

## 2022-12-21 DIAGNOSIS — M1990 Unspecified osteoarthritis, unspecified site: Secondary | ICD-10-CM | POA: Diagnosis not present

## 2022-12-21 DIAGNOSIS — Z87891 Personal history of nicotine dependence: Secondary | ICD-10-CM | POA: Diagnosis not present

## 2022-12-21 DIAGNOSIS — R42 Dizziness and giddiness: Secondary | ICD-10-CM | POA: Diagnosis not present

## 2022-12-21 DIAGNOSIS — E119 Type 2 diabetes mellitus without complications: Secondary | ICD-10-CM | POA: Diagnosis not present

## 2022-12-21 DIAGNOSIS — E785 Hyperlipidemia, unspecified: Secondary | ICD-10-CM | POA: Diagnosis not present

## 2022-12-21 DIAGNOSIS — H538 Other visual disturbances: Secondary | ICD-10-CM | POA: Diagnosis not present

## 2022-12-21 DIAGNOSIS — I69398 Other sequelae of cerebral infarction: Secondary | ICD-10-CM | POA: Diagnosis not present

## 2022-12-21 DIAGNOSIS — I10 Essential (primary) hypertension: Secondary | ICD-10-CM | POA: Diagnosis not present

## 2022-12-29 ENCOUNTER — Ambulatory Visit: Payer: Self-pay | Admitting: Family Medicine

## 2022-12-30 DIAGNOSIS — M1990 Unspecified osteoarthritis, unspecified site: Secondary | ICD-10-CM | POA: Diagnosis not present

## 2022-12-30 DIAGNOSIS — Z87891 Personal history of nicotine dependence: Secondary | ICD-10-CM | POA: Diagnosis not present

## 2022-12-30 DIAGNOSIS — I10 Essential (primary) hypertension: Secondary | ICD-10-CM | POA: Diagnosis not present

## 2022-12-30 DIAGNOSIS — I69398 Other sequelae of cerebral infarction: Secondary | ICD-10-CM | POA: Diagnosis not present

## 2022-12-30 DIAGNOSIS — E785 Hyperlipidemia, unspecified: Secondary | ICD-10-CM | POA: Diagnosis not present

## 2022-12-30 DIAGNOSIS — E876 Hypokalemia: Secondary | ICD-10-CM | POA: Diagnosis not present

## 2022-12-30 DIAGNOSIS — R42 Dizziness and giddiness: Secondary | ICD-10-CM | POA: Diagnosis not present

## 2022-12-30 DIAGNOSIS — E119 Type 2 diabetes mellitus without complications: Secondary | ICD-10-CM | POA: Diagnosis not present

## 2022-12-30 DIAGNOSIS — H538 Other visual disturbances: Secondary | ICD-10-CM | POA: Diagnosis not present

## 2023-01-04 DIAGNOSIS — I10 Essential (primary) hypertension: Secondary | ICD-10-CM | POA: Diagnosis not present

## 2023-01-04 DIAGNOSIS — E785 Hyperlipidemia, unspecified: Secondary | ICD-10-CM | POA: Diagnosis not present

## 2023-01-04 DIAGNOSIS — I69398 Other sequelae of cerebral infarction: Secondary | ICD-10-CM | POA: Diagnosis not present

## 2023-01-04 DIAGNOSIS — E119 Type 2 diabetes mellitus without complications: Secondary | ICD-10-CM | POA: Diagnosis not present

## 2023-01-04 DIAGNOSIS — E876 Hypokalemia: Secondary | ICD-10-CM | POA: Diagnosis not present

## 2023-01-04 DIAGNOSIS — M1990 Unspecified osteoarthritis, unspecified site: Secondary | ICD-10-CM | POA: Diagnosis not present

## 2023-01-04 DIAGNOSIS — R42 Dizziness and giddiness: Secondary | ICD-10-CM | POA: Diagnosis not present

## 2023-01-04 DIAGNOSIS — Z87891 Personal history of nicotine dependence: Secondary | ICD-10-CM | POA: Diagnosis not present

## 2023-01-04 DIAGNOSIS — H538 Other visual disturbances: Secondary | ICD-10-CM | POA: Diagnosis not present

## 2023-01-11 DIAGNOSIS — I10 Essential (primary) hypertension: Secondary | ICD-10-CM | POA: Diagnosis not present

## 2023-01-11 DIAGNOSIS — R42 Dizziness and giddiness: Secondary | ICD-10-CM | POA: Diagnosis not present

## 2023-01-11 DIAGNOSIS — E785 Hyperlipidemia, unspecified: Secondary | ICD-10-CM | POA: Diagnosis not present

## 2023-01-11 DIAGNOSIS — H538 Other visual disturbances: Secondary | ICD-10-CM | POA: Diagnosis not present

## 2023-01-11 DIAGNOSIS — E119 Type 2 diabetes mellitus without complications: Secondary | ICD-10-CM | POA: Diagnosis not present

## 2023-01-11 DIAGNOSIS — I69398 Other sequelae of cerebral infarction: Secondary | ICD-10-CM | POA: Diagnosis not present

## 2023-01-11 DIAGNOSIS — Z87891 Personal history of nicotine dependence: Secondary | ICD-10-CM | POA: Diagnosis not present

## 2023-01-11 DIAGNOSIS — M1990 Unspecified osteoarthritis, unspecified site: Secondary | ICD-10-CM | POA: Diagnosis not present

## 2023-01-11 DIAGNOSIS — E876 Hypokalemia: Secondary | ICD-10-CM | POA: Diagnosis not present

## 2023-01-22 IMAGING — CT CT ABD-PELV W/O CM
1 of 2 series · 14 of 32 positions shown, 19 images · non-contrast
Comparison: 09/04/2016

CLINICAL DATA: Weight loss for 1 year

EXAM:
CT ABDOMEN AND PELVIS WITHOUT CONTRAST
TECHNIQUE: Multidetector CT imaging of the abdomen and pelvis was performed
following the standard protocol without IV contrast.

[Series 2: abd/pelvis w/(date) · axial · 0.81mm/px · z∈[-410,-70]mm · 14 of 78 slices shown, 19 images]
[im 5/78  soft-tissue]
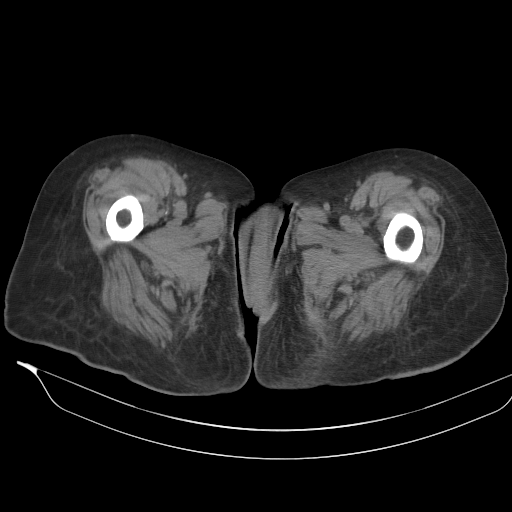
[im 5/78  bone]
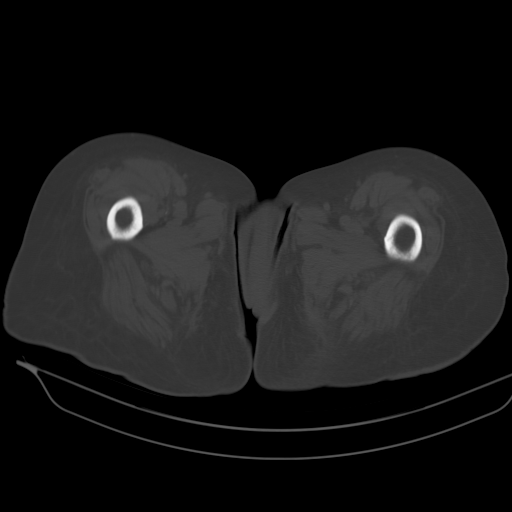
[im 9/78  soft-tissue]
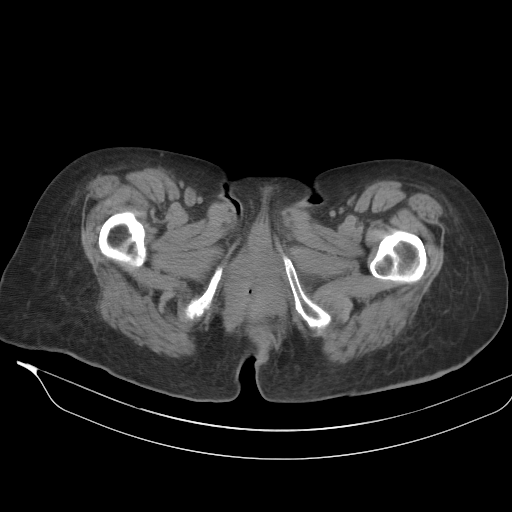
[im 18/78  soft-tissue]
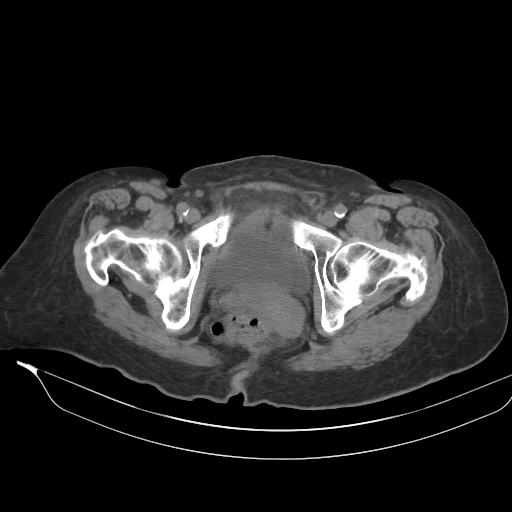
[im 22/78  soft-tissue]
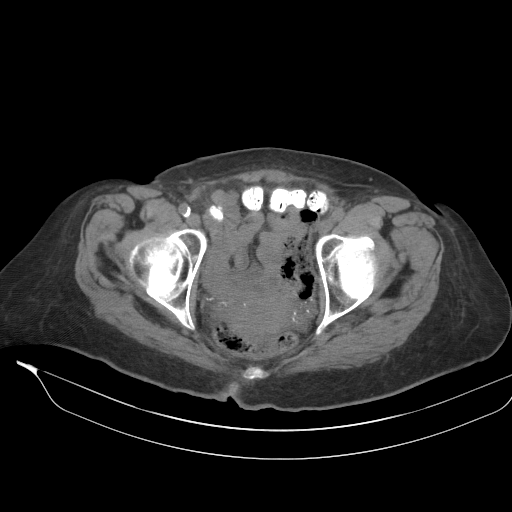
[im 26/78  soft-tissue]
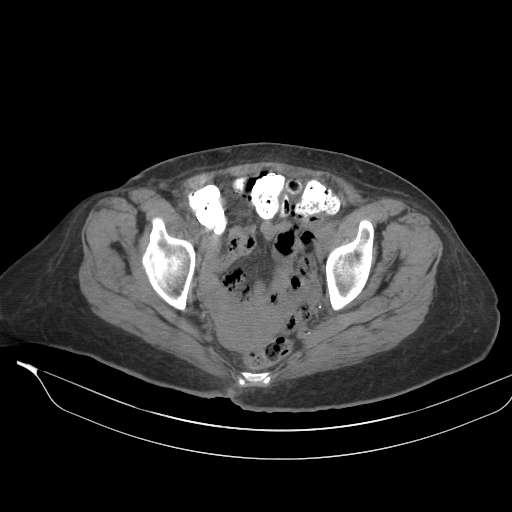
[im 35/78  soft-tissue]
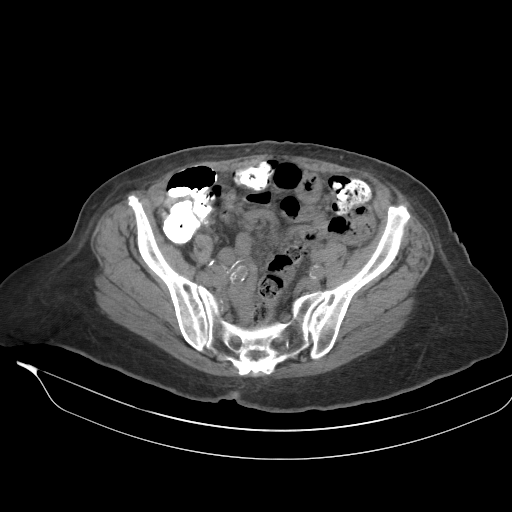
[im 39/78  soft-tissue]
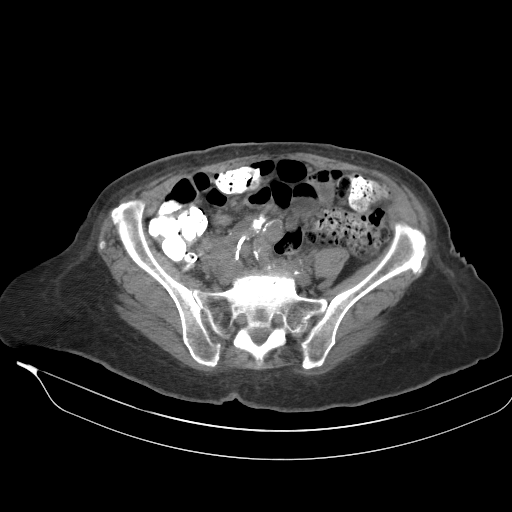
[im 43/78  soft-tissue]
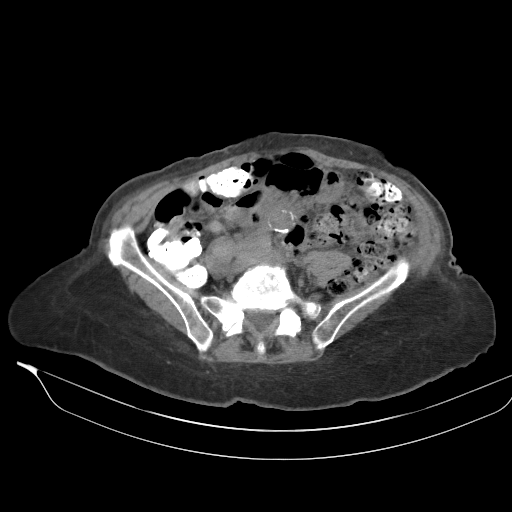
[im 52/78  soft-tissue]
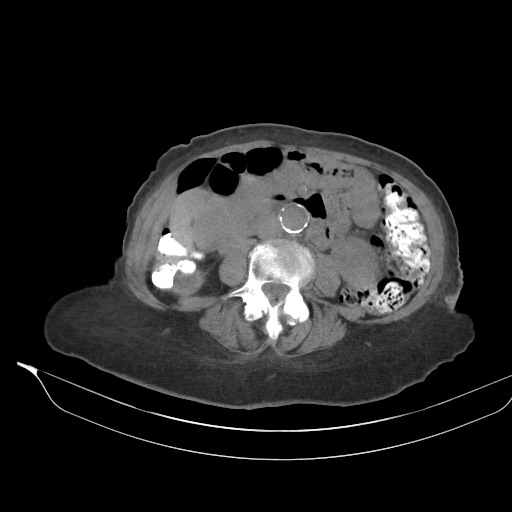
[im 52/78  bone]
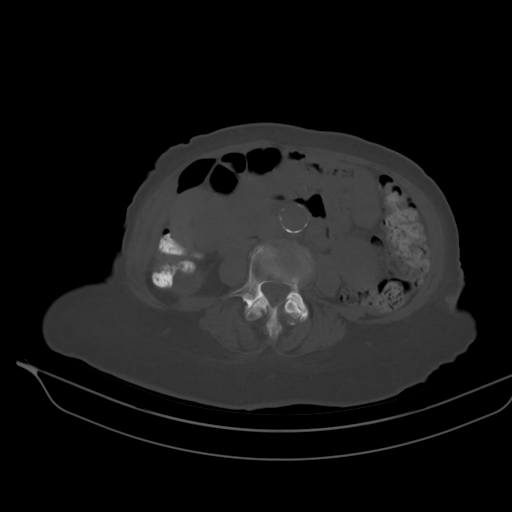
[im 56/78  soft-tissue]
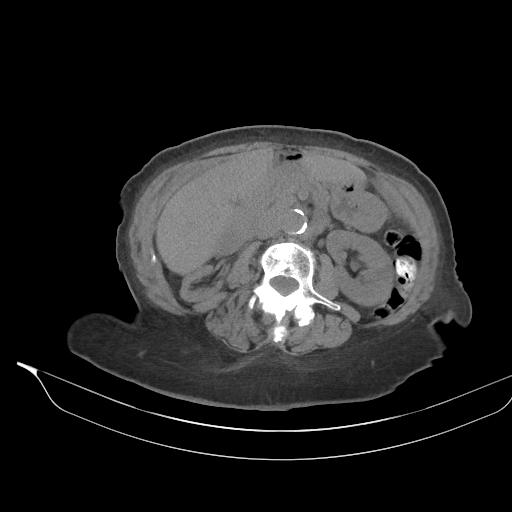
[im 60/78  soft-tissue]
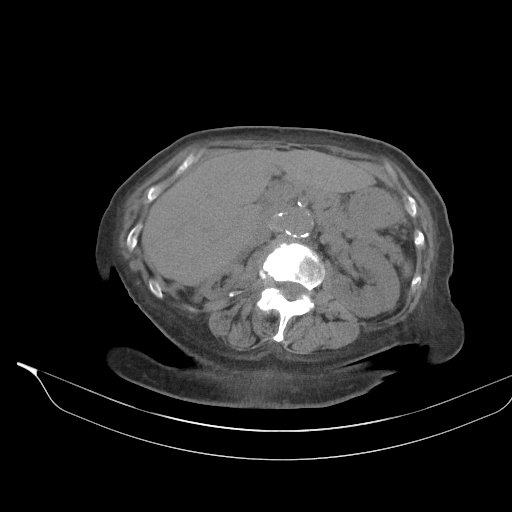
[im 60/78  lung]
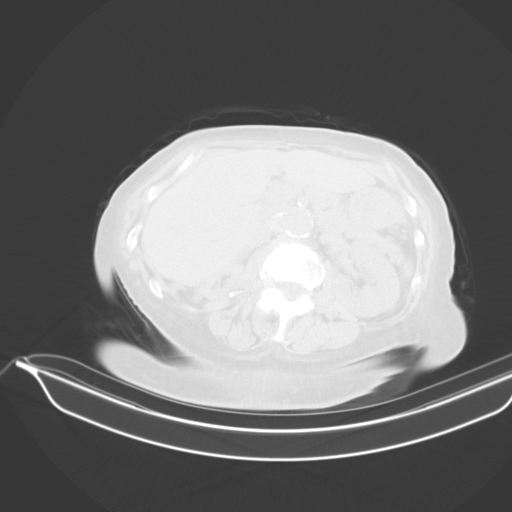
[im 65/78  lung]
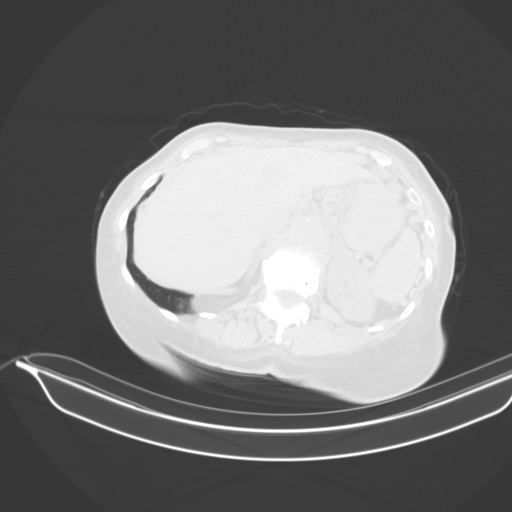
[im 69/78  soft-tissue]
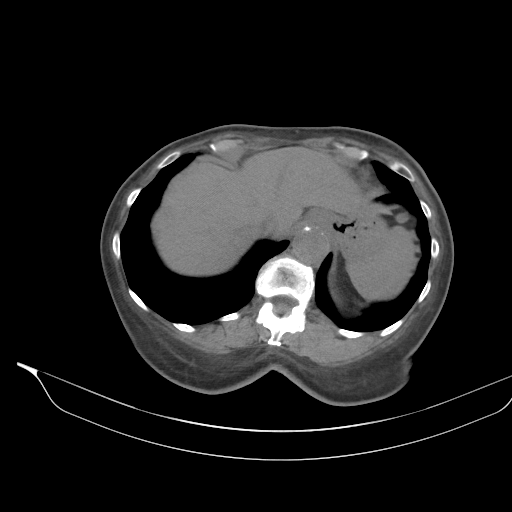
[im 69/78  lung]
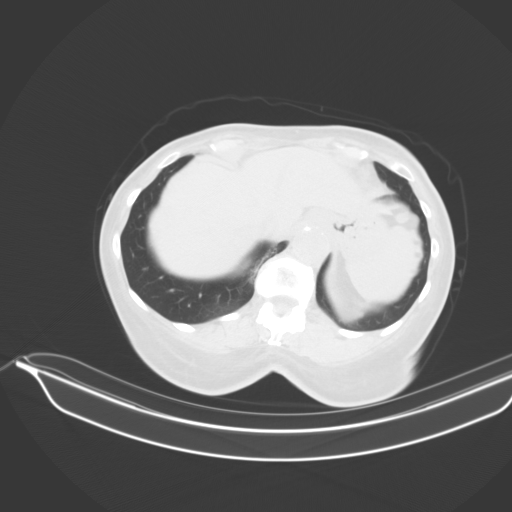
[im 73/78  soft-tissue]
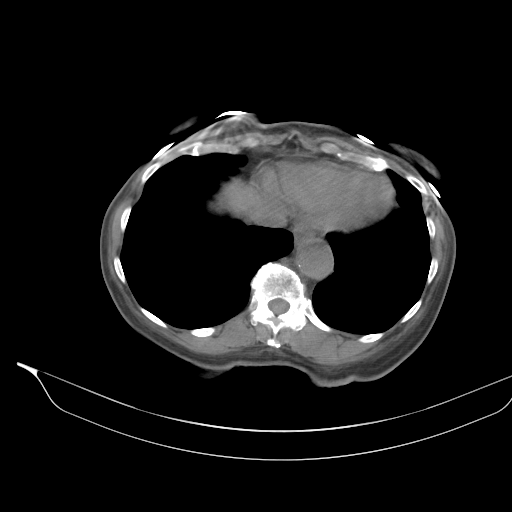
[im 73/78  lung]
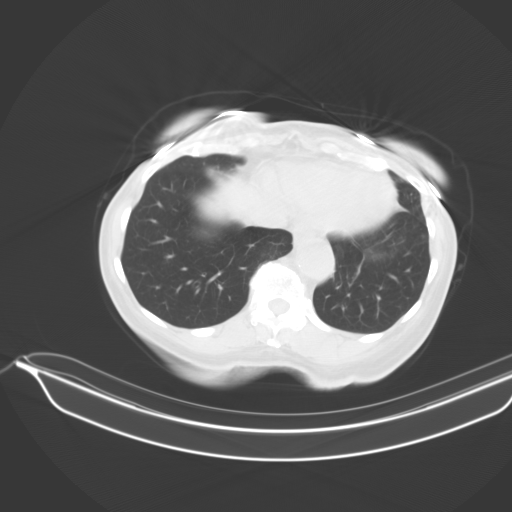

[14 of 32 positions shown; findings below may reference images not displayed]

FINDINGS: Lower chest: No acute abnormality.

Hepatobiliary: Gallbladder is incompletely distended. The liver is
within normal limits.

Pancreas: Unremarkable. No pancreatic ductal dilatation or
surrounding inflammatory changes.

Spleen: Normal in size without focal abnormality.

Adrenals/Urinary Tract: Adrenal glands are within normal limits. The
right kidney is shrunken. Previously seen lower pole cyst is no
longer identified on the right. Left kidney is somewhat
hypertrophied. No renal calculi are noted. No obstructive changes
are noted. Bladder is partially distended

Stomach/Bowel: The appendix is within normal limits. No obstructive
or inflammatory changes of the bowel are seen. Stomach is within
normal limits.

Vascular/Lymphatic: Atherosclerotic calcifications are seen. No
aneurysmal dilatation is noted. Mild tortuosity of the aorta is
seen. No specific venous abnormality is noted. No lymphadenopathy is
noted.

Reproductive: Uterus and bilateral adnexa are unremarkable.

Other: No abdominal wall hernia or abnormality. No abdominopelvic
ascites.

Musculoskeletal: Degenerative changes of lumbar spine are noted.
IMPRESSION: No abnormality is identified correspond with the patient's given
clinical history.

Small shrunken right kidney new from the prior exam.

Aortic Atherosclerosis ([id number]-[id number].A).

## 2023-02-11 ENCOUNTER — Other Ambulatory Visit: Payer: Self-pay

## 2023-02-11 ENCOUNTER — Inpatient Hospital Stay (HOSPITAL_COMMUNITY)
Admission: EM | Admit: 2023-02-11 | Discharge: 2023-02-18 | DRG: 064 | Disposition: A | Discharge status: Rehabilitation Facility | Payer: Medicare HMO | Attending: Internal Medicine | Admitting: Internal Medicine

## 2023-02-11 ENCOUNTER — Observation Stay (HOSPITAL_COMMUNITY): Payer: Medicare HMO

## 2023-02-11 ENCOUNTER — Emergency Department (HOSPITAL_COMMUNITY): Payer: Medicare HMO

## 2023-02-11 ENCOUNTER — Encounter (HOSPITAL_COMMUNITY): Payer: Self-pay

## 2023-02-11 DIAGNOSIS — I69334 Monoplegia of upper limb following cerebral infarction affecting left non-dominant side: Secondary | ICD-10-CM | POA: Diagnosis not present

## 2023-02-11 DIAGNOSIS — R29818 Other symptoms and signs involving the nervous system: Secondary | ICD-10-CM | POA: Diagnosis not present

## 2023-02-11 DIAGNOSIS — R531 Weakness: Secondary | ICD-10-CM | POA: Diagnosis not present

## 2023-02-11 DIAGNOSIS — N182 Chronic kidney disease, stage 2 (mild): Secondary | ICD-10-CM | POA: Diagnosis not present

## 2023-02-11 DIAGNOSIS — I619 Nontraumatic intracerebral hemorrhage, unspecified: Secondary | ICD-10-CM | POA: Diagnosis not present

## 2023-02-11 DIAGNOSIS — Z7982 Long term (current) use of aspirin: Secondary | ICD-10-CM | POA: Diagnosis not present

## 2023-02-11 DIAGNOSIS — R911 Solitary pulmonary nodule: Secondary | ICD-10-CM | POA: Diagnosis not present

## 2023-02-11 DIAGNOSIS — K5900 Constipation, unspecified: Secondary | ICD-10-CM | POA: Diagnosis not present

## 2023-02-11 DIAGNOSIS — I129 Hypertensive chronic kidney disease with stage 1 through stage 4 chronic kidney disease, or unspecified chronic kidney disease: Secondary | ICD-10-CM | POA: Diagnosis present

## 2023-02-11 DIAGNOSIS — G936 Cerebral edema: Secondary | ICD-10-CM | POA: Diagnosis present

## 2023-02-11 DIAGNOSIS — Z9889 Other specified postprocedural states: Secondary | ICD-10-CM | POA: Diagnosis not present

## 2023-02-11 DIAGNOSIS — E1151 Type 2 diabetes mellitus with diabetic peripheral angiopathy without gangrene: Secondary | ICD-10-CM | POA: Diagnosis present

## 2023-02-11 DIAGNOSIS — M792 Neuralgia and neuritis, unspecified: Secondary | ICD-10-CM | POA: Diagnosis not present

## 2023-02-11 DIAGNOSIS — W19XXXA Unspecified fall, initial encounter: Secondary | ICD-10-CM | POA: Diagnosis present

## 2023-02-11 DIAGNOSIS — R29703 NIHSS score 3: Secondary | ICD-10-CM | POA: Diagnosis present

## 2023-02-11 DIAGNOSIS — I6523 Occlusion and stenosis of bilateral carotid arteries: Secondary | ICD-10-CM

## 2023-02-11 DIAGNOSIS — E7800 Pure hypercholesterolemia, unspecified: Secondary | ICD-10-CM | POA: Diagnosis not present

## 2023-02-11 DIAGNOSIS — I639 Cerebral infarction, unspecified: Secondary | ICD-10-CM | POA: Diagnosis not present

## 2023-02-11 DIAGNOSIS — N1831 Chronic kidney disease, stage 3a: Secondary | ICD-10-CM | POA: Diagnosis present

## 2023-02-11 DIAGNOSIS — E1169 Type 2 diabetes mellitus with other specified complication: Secondary | ICD-10-CM | POA: Diagnosis not present

## 2023-02-11 DIAGNOSIS — R414 Neurologic neglect syndrome: Secondary | ICD-10-CM | POA: Diagnosis present

## 2023-02-11 DIAGNOSIS — R209 Unspecified disturbances of skin sensation: Secondary | ICD-10-CM | POA: Diagnosis not present

## 2023-02-11 DIAGNOSIS — E119 Type 2 diabetes mellitus without complications: Secondary | ICD-10-CM

## 2023-02-11 DIAGNOSIS — E785 Hyperlipidemia, unspecified: Secondary | ICD-10-CM | POA: Diagnosis present

## 2023-02-11 DIAGNOSIS — J439 Emphysema, unspecified: Secondary | ICD-10-CM | POA: Diagnosis not present

## 2023-02-11 DIAGNOSIS — Z91148 Patient's other noncompliance with medication regimen for other reason: Secondary | ICD-10-CM

## 2023-02-11 DIAGNOSIS — Z8673 Personal history of transient ischemic attack (TIA), and cerebral infarction without residual deficits: Secondary | ICD-10-CM | POA: Diagnosis not present

## 2023-02-11 DIAGNOSIS — R471 Dysarthria and anarthria: Secondary | ICD-10-CM | POA: Diagnosis present

## 2023-02-11 DIAGNOSIS — I69322 Dysarthria following cerebral infarction: Secondary | ICD-10-CM | POA: Diagnosis not present

## 2023-02-11 DIAGNOSIS — I6529 Occlusion and stenosis of unspecified carotid artery: Secondary | ICD-10-CM | POA: Diagnosis not present

## 2023-02-11 DIAGNOSIS — Z833 Family history of diabetes mellitus: Secondary | ICD-10-CM

## 2023-02-11 DIAGNOSIS — Z7902 Long term (current) use of antithrombotics/antiplatelets: Secondary | ICD-10-CM | POA: Diagnosis not present

## 2023-02-11 DIAGNOSIS — I10 Essential (primary) hypertension: Secondary | ICD-10-CM | POA: Diagnosis not present

## 2023-02-11 DIAGNOSIS — G8194 Hemiplegia, unspecified affecting left nondominant side: Secondary | ICD-10-CM | POA: Diagnosis not present

## 2023-02-11 DIAGNOSIS — I6503 Occlusion and stenosis of bilateral vertebral arteries: Secondary | ICD-10-CM | POA: Diagnosis not present

## 2023-02-11 DIAGNOSIS — M50222 Other cervical disc displacement at C5-C6 level: Secondary | ICD-10-CM | POA: Diagnosis not present

## 2023-02-11 DIAGNOSIS — E1122 Type 2 diabetes mellitus with diabetic chronic kidney disease: Secondary | ICD-10-CM | POA: Diagnosis present

## 2023-02-11 DIAGNOSIS — Z886 Allergy status to analgesic agent status: Secondary | ICD-10-CM

## 2023-02-11 DIAGNOSIS — Z794 Long term (current) use of insulin: Secondary | ICD-10-CM | POA: Diagnosis not present

## 2023-02-11 DIAGNOSIS — Z79899 Other long term (current) drug therapy: Secondary | ICD-10-CM

## 2023-02-11 DIAGNOSIS — R410 Disorientation, unspecified: Secondary | ICD-10-CM | POA: Diagnosis not present

## 2023-02-11 DIAGNOSIS — Z87891 Personal history of nicotine dependence: Secondary | ICD-10-CM

## 2023-02-11 DIAGNOSIS — Z8249 Family history of ischemic heart disease and other diseases of the circulatory system: Secondary | ICD-10-CM

## 2023-02-11 DIAGNOSIS — G35 Multiple sclerosis: Secondary | ICD-10-CM | POA: Diagnosis present

## 2023-02-11 DIAGNOSIS — I69312 Visuospatial deficit and spatial neglect following cerebral infarction: Secondary | ICD-10-CM | POA: Diagnosis not present

## 2023-02-11 DIAGNOSIS — S0990XA Unspecified injury of head, initial encounter: Secondary | ICD-10-CM | POA: Diagnosis not present

## 2023-02-11 DIAGNOSIS — E876 Hypokalemia: Secondary | ICD-10-CM | POA: Diagnosis not present

## 2023-02-11 DIAGNOSIS — Z91199 Patient's noncompliance with other medical treatment and regimen due to unspecified reason: Secondary | ICD-10-CM

## 2023-02-11 DIAGNOSIS — E1165 Type 2 diabetes mellitus with hyperglycemia: Secondary | ICD-10-CM | POA: Diagnosis not present

## 2023-02-11 DIAGNOSIS — I2510 Atherosclerotic heart disease of native coronary artery without angina pectoris: Secondary | ICD-10-CM | POA: Diagnosis present

## 2023-02-11 DIAGNOSIS — Z7984 Long term (current) use of oral hypoglycemic drugs: Secondary | ICD-10-CM | POA: Diagnosis not present

## 2023-02-11 DIAGNOSIS — M19012 Primary osteoarthritis, left shoulder: Secondary | ICD-10-CM | POA: Diagnosis not present

## 2023-02-11 DIAGNOSIS — I69398 Other sequelae of cerebral infarction: Secondary | ICD-10-CM | POA: Diagnosis not present

## 2023-02-11 DIAGNOSIS — I63531 Cerebral infarction due to unspecified occlusion or stenosis of right posterior cerebral artery: Secondary | ICD-10-CM | POA: Diagnosis not present

## 2023-02-11 DIAGNOSIS — K5901 Slow transit constipation: Secondary | ICD-10-CM | POA: Diagnosis not present

## 2023-02-11 DIAGNOSIS — I63331 Cerebral infarction due to thrombosis of right posterior cerebral artery: Secondary | ICD-10-CM | POA: Diagnosis not present

## 2023-02-11 DIAGNOSIS — I63532 Cerebral infarction due to unspecified occlusion or stenosis of left posterior cerebral artery: Secondary | ICD-10-CM | POA: Diagnosis not present

## 2023-02-11 DIAGNOSIS — H53462 Homonymous bilateral field defects, left side: Secondary | ICD-10-CM | POA: Diagnosis not present

## 2023-02-11 DIAGNOSIS — N3942 Incontinence without sensory awareness: Secondary | ICD-10-CM | POA: Diagnosis not present

## 2023-02-11 DIAGNOSIS — I6623 Occlusion and stenosis of bilateral posterior cerebral arteries: Secondary | ICD-10-CM | POA: Diagnosis not present

## 2023-02-11 DIAGNOSIS — S199XXA Unspecified injury of neck, initial encounter: Secondary | ICD-10-CM | POA: Diagnosis not present

## 2023-02-11 DIAGNOSIS — E872 Acidosis, unspecified: Secondary | ICD-10-CM | POA: Diagnosis not present

## 2023-02-11 DIAGNOSIS — Z7985 Long-term (current) use of injectable non-insulin antidiabetic drugs: Secondary | ICD-10-CM | POA: Diagnosis not present

## 2023-02-11 DIAGNOSIS — I69393 Ataxia following cerebral infarction: Secondary | ICD-10-CM | POA: Diagnosis not present

## 2023-02-11 DIAGNOSIS — M25512 Pain in left shoulder: Secondary | ICD-10-CM | POA: Diagnosis not present

## 2023-02-11 DIAGNOSIS — I779 Disorder of arteries and arterioles, unspecified: Secondary | ICD-10-CM | POA: Diagnosis present

## 2023-02-11 HISTORY — DX: 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: I63.132

## 2023-02-11 LAB — URINALYSIS, ROUTINE W REFLEX MICROSCOPIC
Bilirubin Urine: NEGATIVE
Glucose, UA: NEGATIVE mg/dL
Hgb urine dipstick: NEGATIVE
Ketones, ur: NEGATIVE mg/dL
Leukocytes,Ua: NEGATIVE
Nitrite: NEGATIVE
Protein, ur: NEGATIVE mg/dL
Specific Gravity, Urine: 1.006 (ref 1.005–1.030)
pH: 8 (ref 5.0–8.0)

## 2023-02-11 LAB — COMPREHENSIVE METABOLIC PANEL
ALT: 11 U/L (ref 0–44)
AST: 26 U/L (ref 15–41)
Albumin: 3.5 g/dL (ref 3.5–5.0)
Alkaline Phosphatase: 60 U/L (ref 38–126)
Anion gap: 12 (ref 5–15)
BUN: 19 mg/dL (ref 8–23)
CO2: 23 mmol/L (ref 22–32)
Calcium: 8.8 mg/dL — ABNORMAL LOW (ref 8.9–10.3)
Chloride: 104 mmol/L (ref 98–111)
Creatinine, Ser: 1.13 mg/dL — ABNORMAL HIGH (ref 0.44–1.00)
GFR, Estimated: 48 mL/min — ABNORMAL LOW (ref >60)
Glucose, Bld: 133 mg/dL — ABNORMAL HIGH (ref 70–99)
Potassium: 2.9 mmol/L — ABNORMAL LOW (ref 3.5–5.1)
Sodium: 139 mmol/L (ref 135–145)
Total Bilirubin: 0.8 mg/dL (ref 0.0–1.2)
Total Protein: 6.6 g/dL (ref 6.5–8.1)

## 2023-02-11 LAB — DIFFERENTIAL
Abs Immature Granulocytes: 0.02 10*3/uL (ref 0.00–0.07)
Basophils Absolute: 0 10*3/uL (ref 0.0–0.1)
Basophils Relative: 1 %
Eosinophils Absolute: 0 10*3/uL (ref 0.0–0.5)
Eosinophils Relative: 0 %
Immature Granulocytes: 0 %
Lymphocytes Relative: 23 %
Lymphs Abs: 1.4 10*3/uL (ref 0.7–4.0)
Monocytes Absolute: 0.7 10*3/uL (ref 0.1–1.0)
Monocytes Relative: 11 %
Neutro Abs: 3.9 10*3/uL (ref 1.7–7.7)
Neutrophils Relative %: 65 %

## 2023-02-11 LAB — CBC
HCT: 43.2 % (ref 36.0–46.0)
Hemoglobin: 14 g/dL (ref 12.0–15.0)
MCH: 27.9 pg (ref 26.0–34.0)
MCHC: 32.4 g/dL (ref 30.0–36.0)
MCV: 86.2 fL (ref 80.0–100.0)
Platelets: 185 10*3/uL (ref 150–400)
RBC: 5.01 MIL/uL (ref 3.87–5.11)
RDW: 14 % (ref 11.5–15.5)
WBC: 6 10*3/uL (ref 4.0–10.5)
nRBC: 0 % (ref 0.0–0.2)

## 2023-02-11 LAB — RAPID URINE DRUG SCREEN, HOSP PERFORMED
Amphetamines: NOT DETECTED
Barbiturates: NOT DETECTED
Benzodiazepines: NOT DETECTED
Cocaine: NOT DETECTED
Opiates: NOT DETECTED
Tetrahydrocannabinol: NOT DETECTED

## 2023-02-11 LAB — ETHANOL: Alcohol, Ethyl (B): <10 mg/dL (ref <10)

## 2023-02-11 LAB — MAGNESIUM: Magnesium: 1.7 mg/dL (ref 1.7–2.4)

## 2023-02-11 LAB — APTT: aPTT: 31 s (ref 24–36)

## 2023-02-11 LAB — CBG MONITORING, ED: Glucose-Capillary: 139 mg/dL — ABNORMAL HIGH (ref 70–99)

## 2023-02-11 LAB — PROTIME-INR
INR: 1 (ref 0.8–1.2)
Prothrombin Time: 13.5 s (ref 11.4–15.2)

## 2023-02-11 MED ORDER — STROKE: EARLY STAGES OF RECOVERY BOOK
Freq: Once | Status: AC
Start: 2023-02-12 — End: 2023-02-12
  Filled 2023-02-11: qty 1

## 2023-02-11 MED ORDER — ACETAMINOPHEN 325 MG PO TABS: 650.0000 mg | ORAL_TABLET | ORAL | Status: DC | PRN

## 2023-02-11 MED ORDER — CLOPIDOGREL BISULFATE 75 MG PO TABS
75.0000 mg | ORAL_TABLET | Freq: Every day | ORAL | Status: DC
  Administered 2023-02-11 – 2023-02-12 (×2): 75 mg via ORAL
  Filled 2023-02-11 (×2): qty 1

## 2023-02-11 MED ORDER — SODIUM CHLORIDE 0.9 % IV SOLN: INTRAVENOUS | Status: AC

## 2023-02-11 MED ORDER — ASPIRIN 81 MG PO TBEC
81.0000 mg | DELAYED_RELEASE_TABLET | Freq: Every day | ORAL | Status: DC
Start: 2023-02-11 — End: 2023-02-18
  Administered 2023-02-11 – 2023-02-18 (×8): 81 mg via ORAL
  Filled 2023-02-11 (×8): qty 1

## 2023-02-11 MED ORDER — ACETAMINOPHEN 650 MG RE SUPP: 650.0000 mg | RECTAL | Status: DC | PRN

## 2023-02-11 MED ORDER — POTASSIUM CHLORIDE IN NACL 20-0.9 MEQ/L-% IV SOLN
Freq: Once | INTRAVENOUS | Status: AC
  Filled 2023-02-11: qty 1000

## 2023-02-11 MED ORDER — SENNOSIDES-DOCUSATE SODIUM 8.6-50 MG PO TABS: 1.0000 | ORAL_TABLET | Freq: Every evening | ORAL | Status: DC | PRN

## 2023-02-11 MED ORDER — POTASSIUM CHLORIDE 20 MEQ PO PACK: 40.0000 meq | PACK | Freq: Once | ORAL | Status: DC

## 2023-02-11 MED ORDER — ACETAMINOPHEN 160 MG/5ML PO SOLN: 650.0000 mg | ORAL | Status: DC | PRN

## 2023-02-11 MED ORDER — IOHEXOL 350 MG/ML SOLN
65.0000 mL | Freq: Once | INTRAVENOUS | Status: AC | PRN
  Administered 2023-02-11: 65 mL via INTRAVENOUS

## 2023-02-11 NOTE — ED Notes (Signed)
 RN called CCMD for cardiac monitoring.

## 2023-02-11 NOTE — ED Provider Notes (Signed)
 88 yo female noncompliant with medications, hx of CVA, here with mental status change/confusion and left arm heaviness, now resolved  Last seen well yesterday evening around 8-9 pm by family before bedtime  Family here  K 2.9, labs otherwise okay, UA without abnormalities Pending CT imaging of the brain results   Physical Exam  BP (!) 170/89   Pulse 81   Temp 98.9 F (37.2 C) (Oral)   Resp 16   Ht 5' 6 (1.676 m)   Wt 63.5 kg   SpO2 100%   BMI 22.60 kg/m   Physical Exam  Procedures  Procedures  ED Course / MDM   Clinical Course as of 02/11/23 1736  Thu Feb 11, 2023  1655 Dr Vanessa neurology consulted - recommending CTA repeat, he is ordering aspirin  and plavex.  Will admit [MT]  1735 Admitted to Dr Seena hospitalist [MT]    Clinical Course User Index [MT] Cottie Donnice PARAS, MD   Medical Decision Making Amount and/or Complexity of Data Reviewed Labs: ordered. Radiology: ordered.  Risk Prescription drug management. Decision regarding hospitalization.   Workup reviewed and CT concerning for right PCA infarct On my reassessment the patient is neurological exam is notable for left hemianopsia likely involving both eyes, but more notable in the right eye.  She otherwise does not have any cranial nerve deficits, her speech is clear, and her strength and sensation are intact.  The patient reports that she was prescribed aspirin  81 mg but is not been taking her regularly.  She does take Plavix  75 mg daily.  She says she is only intermittently compliant with her medications.    Her son-in-law is present at the bedside reports that the patient lives with him and his wife, and that she is typically quite independent.  No prior history of dementia.  The patient will be admitted for acute stroke evaluation.  She had a CT angiogram performed 3 months ago and I will discuss with neurology whether they need a repeat angiogram scan at this time.  Will discuss medication and  likely MRI imaging with neurology as well.       Cottie Donnice PARAS, MD 02/11/23 [phone]

## 2023-02-11 NOTE — ED Provider Notes (Signed)
 [place] [facility] AT [facility] Provider Note   CSN: [id number] Arrival date & time: 02/11/23  1147     History  No chief complaint on file.   Kathleen Figueroa is a 88 y.o. female.  HPI Patient presents from home via EMS with concern for left arm heaviness, possible fall.  Patient is withdrawn, answers questions when directly spoken to, but with inconsistent answers.  Per EMS the patient was last seen normal yesterday 16 hours ago.  She was found today on the ground, complaining of left arm heaviness.  EMS reports no hemodynamic instability, but patient with slow to answer questions, complaining of left arm heaviness, and questionable discoordination. EMS reports that the patient may not have been taking her Plavix  recently.    Home Medications Prior to Admission medications   Medication Sig Start Date End Date Taking? Authorizing Provider  amLODipine  (NORVASC ) 10 MG tablet Take 1 tablet (10 mg total) by mouth daily. 11/13/22   Ghimire, Donalda HERO, MD  aspirin  EC 81 MG tablet Take 1 tablet (81 mg total) by mouth daily. Take aspirin  for 21 days along with plavix -after 21 days stop aspirin  and continue with plavix  11/13/22   Raenelle Donalda HERO, MD  atorvastatin  (LIPITOR ) 80 MG tablet Take 1 tablet (80 mg total) by mouth daily. 11/13/22   Ghimire, Donalda HERO, MD  BAYER CONTOUR NEXT TEST test strip  04/24/14   [provider]  chlorthalidone  (HYGROTON ) 25 MG tablet Take 1 tablet (25 mg total) by mouth every morning. 11/13/22   Ghimire, Donalda HERO, MD  clopidogrel  (PLAVIX ) 75 MG tablet Take 1 tablet (75 mg total) by mouth daily. 11/14/22   Ghimire, Donalda HERO, MD  metFORMIN  (GLUCOPHAGE ) 1000 MG tablet Take 1 tablet (1,000 mg total) by mouth daily with breakfast. Hold for 2 days as you received IV contrast, and resume on 05/10/2018 11/13/22   Raenelle Donalda HERO, MD  Multiple Vitamin (MULTIVITAMIN WITH MINERALS) TABS tablet Take 1 tablet by mouth daily. Centrum 11/13/22    Ghimire, Donalda HERO, MD  valsartan  (DIOVAN ) 320 MG tablet Take 1 tablet (320 mg total) by mouth daily. 11/13/22   Ghimire, Donalda HERO, MD      Allergies    Aspirin     Review of Systems   Review of Systems  Physical Exam Updated Vital Signs There were no vitals taken for this visit. Physical Exam Vitals and nursing note reviewed.  Constitutional:      General: She is not in acute distress.    Appearance: She is well-developed.  HENT:     Head: Normocephalic and atraumatic.  Eyes:     Conjunctiva/sclera: Conjunctivae normal.  Cardiovascular:     Rate and Rhythm: Normal rate and regular rhythm.  Pulmonary:     Effort: Pulmonary effort is normal. No respiratory distress.     Breath sounds: Normal breath sounds. No stridor.  Abdominal:     General: There is no distension.  Skin:    General: Skin is warm and dry.  Neurological:     Mental Status: She is alert.     Cranial Nerves: No cranial nerve deficit.     Motor: Atrophy present. No tremor.     Comments: L arm heaviness but patient follows commands, moves the arm appropriately. No facial asym. Speech slow but clear.  Psychiatric:        Mood and Affect: Mood normal.     ED Results / Procedures / Treatments   Labs (all labs ordered  are listed, but only abnormal results are displayed) Labs Reviewed  COMPREHENSIVE METABOLIC PANEL - Abnormal; Notable for the following components:      Result Value   Potassium 2.9 (*)    Glucose, Bld 133 (*)    Creatinine, Ser 1.13 (*)    Calcium  8.8 (*)    GFR, Estimated 48 (*)    All other components within normal limits  URINALYSIS, ROUTINE W REFLEX MICROSCOPIC - Abnormal; Notable for the following components:   Color, Urine STRAW (*)    All other components within normal limits  LIPID PANEL - Abnormal; Notable for the following components:   Cholesterol 276 (*)    LDL Cholesterol 193 (*)    All other components within normal limits  CBG MONITORING, ED - Abnormal; Notable for  the following components:   Glucose-Capillary 139 (*)    All other components within normal limits  PROTIME-INR  APTT  CBC  DIFFERENTIAL  ETHANOL  RAPID URINE DRUG SCREEN, HOSP PERFORMED  MAGNESIUM   HEMOGLOBIN A1C  BASIC METABOLIC PANEL  MAGNESIUM   CBC WITH DIFFERENTIAL/PLATELET    EKG EKG Interpretation Date/Time:  Thursday February 11 2023 12:12:54 EST Ventricular Rate:  93 PR Interval:  185 QRS Duration:  90 QT Interval:  375 QTC Calculation: 467 R Axis:   -26  Text Interpretation: Sinus rhythm Atrial premature complexes Borderline left axis deviation Confirmed by Garrick Charleston [phone]) on 02/11/2023 2:30:03 PM  Radiology   CT CERVICAL SPINE WO CONTRAST Result Date: 02/11/2023 CLINICAL DATA:  Polytrauma, blunt; Mental status change, unknown cause. Fall. EXAM: CT HEAD WITHOUT CONTRAST CT CERVICAL SPINE WITHOUT CONTRAST TECHNIQUE: Multidetector CT imaging of the head and cervical spine was performed following the standard protocol without intravenous contrast. Multiplanar CT image reconstructions of the cervical spine were also generated. RADIATION DOSE REDUCTION: This exam was performed according to the departmental dose-optimization program which includes automated exposure control, adjustment of the mA and/or kV according to patient size and/or use of iterative reconstruction technique. COMPARISON:  CTA head and neck 11/13/2022.  MRI head 11/12/2022. FINDINGS: CT HEAD FINDINGS Brain: There is a large acute or subacute right PCA infarct with cytotoxic edema resulting in mild mass effect on the right lateral ventricle. No intracranial hemorrhage or midline shift is evident. There is no extra-axial fluid collection or hydrocephalus. A moderate-sized chronic left parietal infarct and small chronic infarcts in the deep gray nuclei bilaterally, pons, and genu of the corpus callosum are again noted. There is a background of extensive chronic small vessel ischemia in the cerebral white  matter. There is mild ex vacuo dilatation of the left lateral ventricle. Vascular: Calcified atherosclerosis at the skull base. Skull: No acute fracture or suspicious osseous lesion. Sinuses/Orbits: No significant inflammatory changes in the included portion of the paranasal sinuses or mastoid air cells. Bilateral cataract extraction. Right scleral buckle. Other: None. CT CERVICAL SPINE FINDINGS Alignment: Straightening of the normal cervical lordosis. Unchanged grade 1 anterolisthesis of C2 on C3 and C3 on C4 and grade 1 retrolisthesis of C5 on C6. 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 No acute fracture or suspicious osseous lesion. Soft tissues and spinal canal: No prevertebral fluid or swelling. No visible canal hematoma. Disc levels: Advanced disc degeneration at C5-6 and C7-T1. Advanced multilevel facet arthrosis. Moderate multilevel neural foraminal stenosis. Possible moderate spinal stenosis at C5-6. Upper chest: Emphysema and chronic centrilobular micronodularity. Other: Atherosclerotic calcification at the right carotid bifurcation. Left carotid endarterectomy. IMPRESSION: 1. Large acute/subacute right PCA infarct. No hemorrhage.  2. No acute cervical spine fracture. 3.  Emphysema (ICD10-J43.9). Electronically Signed   By: Dasie Hamburg M.D.   On: 02/11/2023 16:33   CT HEAD WO CONTRAST ( ) Result Date: 02/11/2023 CLINICAL DATA:  Polytrauma, blunt; Mental status change, unknown cause. Fall. EXAM: CT HEAD WITHOUT CONTRAST CT CERVICAL SPINE WITHOUT CONTRAST TECHNIQUE: Multidetector CT imaging of the head and cervical spine was performed following the standard protocol without intravenous contrast. Multiplanar CT image reconstructions of the cervical spine were also generated. RADIATION DOSE REDUCTION: This exam was performed according to the departmental dose-optimization program which includes automated exposure control, adjustment of the mA and/or kV according to patient size and/or use of  iterative reconstruction technique. COMPARISON:  CTA head and neck 11/13/2022.  MRI head 11/12/2022. FINDINGS: CT HEAD FINDINGS Brain: There is a large acute or subacute right PCA infarct with cytotoxic edema resulting in mild mass effect on the right lateral ventricle. No intracranial hemorrhage or midline shift is evident. There is no extra-axial fluid collection or hydrocephalus. A moderate-sized chronic left parietal infarct and small chronic infarcts in the deep gray nuclei bilaterally, pons, and genu of the corpus callosum are again noted. There is a background of extensive chronic small vessel ischemia in the cerebral white matter. There is mild ex vacuo dilatation of the left lateral ventricle. Vascular: Calcified atherosclerosis at the skull base. Skull: No acute fracture or suspicious osseous lesion. Sinuses/Orbits: No significant inflammatory changes in the included portion of the paranasal sinuses or mastoid air cells. Bilateral cataract extraction. Right scleral buckle. Other: None. CT CERVICAL SPINE FINDINGS Alignment: Straightening of the normal cervical lordosis. Unchanged grade 1 anterolisthesis of C2 on C3 and C3 on C4 and grade 1 retrolisthesis of C5 on C6. 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 No acute fracture or suspicious osseous lesion. Soft tissues and spinal canal: No prevertebral fluid or swelling. No visible canal hematoma. Disc levels: Advanced disc degeneration at C5-6 and C7-T1. Advanced multilevel facet arthrosis. Moderate multilevel neural foraminal stenosis. Possible moderate spinal stenosis at C5-6. Upper chest: Emphysema and chronic centrilobular micronodularity. Other: Atherosclerotic calcification at the right carotid bifurcation. Left carotid endarterectomy. IMPRESSION: 1. Large acute/subacute right PCA infarct. No hemorrhage. 2. No acute cervical spine fracture. 3.  Emphysema (ICD10-J43.9). Electronically Signed   By: Dasie Hamburg M.D.   On: 02/11/2023 16:33     Procedures Procedures    Medications Ordered in ED Medications  clopidogrel  (PLAVIX ) tablet 75 mg (75 mg Oral Given 02/12/23 1000)  0.9 %  sodium chloride  infusion (0 mLs Intravenous Stopped 02/12/23 0000)  acetaminophen  (TYLENOL ) tablet 650 mg (has no administration in time range)    Or  acetaminophen  (TYLENOL ) 160 MG/5ML solution 650 mg (has no administration in time range)    Or  acetaminophen  (TYLENOL ) suppository 650 mg (has no administration in time range)  senna-docusate (Senokot-S) tablet 1 tablet (has no administration in time range)  potassium chloride  (KLOR-CON ) packet 40 mEq (0 mEq Oral Hold 02/11/23 1900)  aspirin  EC tablet 81 mg (81 mg Oral Given 02/12/23 1000)  0.9 % NaCl with KCl 20 mEq/ L  infusion (0 mL/hr Intravenous Stopped 02/11/23 1730)   stroke: early stages of recovery book ( Does not apply Given 02/12/23 1000)  iohexol  (OMNIPAQUE ) 350 MG/ML injection 65 mL (65 mLs Intravenous Contrast Given 02/11/23 1942)    ED Course/ Medical Decision Making/ A&P Clinical Course as of 02/12/23 1011  Thu Feb 11, 2023  1655 Dr Vanessa neurology consulted - recommending CTA repeat,  he is ordering aspirin  and plavex.  Will admit [MT]  1735 Admitted to Dr Seena hospitalist [MT]    Clinical Course User Index [MT] Cottie Donnice PARAS, MD                                 Medical Decision Making Elderly female with history of prior stroke, diabetes, hypertension now presents with left arm heaviness after possible fall, being found on the ground.  Broad differential including infection, rhabdomyolysis, stroke.   Amount and/or Complexity of Data Reviewed Independent Historian: EMS External Data Reviewed: notes. Labs: ordered. Decision-making details documented in ED Course. Radiology: ordered and independent interpretation performed. Decision-making details documented in ED Course. ECG/medicine tests: ordered and independent interpretation performed. Decision-making details documented  in ED Course.  Risk Prescription drug management. Decision regarding hospitalization. Diagnosis or treatment significantly limited by social determinants of health.   HR:80 Sr, nml O2:99%ra, nml   On signout the patient was awaiting results, but I had reviewed the CT, which was concerning for CVA.  On next day chart review, this was substantiated, and MRI also had similar findings.  Patient admitted after neuro consult.  Final Clinical Impression(s) / ED Diagnoses Final diagnoses:  Cerebrovascular accident (CVA), unspecified mechanism (HCC)  Hypokalemia     Garrick Charleston, MD 02/12/23 1016

## 2023-02-11 NOTE — ED Notes (Signed)
 CCMD Contacted

## 2023-02-11 NOTE — H&P (Addendum)
 History and Physical   Kathleen Figueroa FMW:[id number] DOB: 14-Sep-1935 DOA: 02/11/2023  PCP: Katina Pfeiffer, PA-C   Patient coming from: Home  Chief Complaint: Arm weakness  HPI: Kathleen Figueroa is a 88 y.o. female with medical history significant of hypertension, hyperlipidemia, diabetes, carotid artery disease, CVA presenting with left arm weakness and possible fall.  Patient was last seen normal yesterday.  Was found down complaining of left arm heaviness today.  Also noted to be slower to answer questions today by EMS and in the ED.  States she has not been consistently taking her aspirin  but may have been consistently taking Plavix .  Left-sided weakness has improved in the ED but has some visual changes that have persisted.  Recent CVA in October as well.  Denies fevers, chills, chest pain, shortness of breath, abdominal pain, constipation, diarrhea, nausea, vomiting.  ED Course: Vital signs in ED notable for blood pressure in the 160s to 190s systolic.  Lab workup included CMP with potassium 2.9, creatinine stable 1.13, glucose 133, calcium  8.8.  CBC within normal limits.  PT, PTT, INR within normal.  UDS negative.  Urinalysis negative.  Alcohol  negative.  CT head showed no large acute to subacute right PCA infarct with no evidence of hemorrhage.  CT C-spine showed no acute normality.  CTA head and neck and MRI brain have been ordered.  Patient has received Plavix  in 20 mEq IV potassium in the ED.  Neurology consulted and following.  Review of Systems: As per HPI otherwise all other systems reviewed and are negative.  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)    CVA (cerebral vascular  accident) (HCC) 11/12/2022   Diabetes mellitus type 2, uncontrolled, 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   H/O detached retina repair    Hypertension    TIA (transient ischemic attack)     Past Surgical History:  Procedure Laterality Date   ENDARTERECTOMY Left 01/19/2014   Procedure: ENDARTERECTOMY CAROTID-LEFT;  Surgeon: Gaile LELON New, MD;  Location: [facility] OR;  Service: Vascular;  Laterality: Left;   EYE SURGERY     lens removed from right eye   PATCH ANGIOPLASTY Left 01/19/2014   Procedure: PATCH ANGIOPLASTY, LEFT CAROTID ARTERY USING HEMASHIELD PLATINUM FINESSE PATCH;  Surgeon: Gaile LELON New, MD;  Location: [facility] OR;  Service: Vascular;  Laterality: Left;   TONSILLECTOMY      Social History  reports that she quit smoking about 45 years ago. Her smoking use included cigarettes. She has never used smokeless tobacco. She reports that she does not drink alcohol  and does not use drugs.  Allergies  Allergen Reactions   Aspirin  Nausea Only    Higher dose    Family History  Problem Relation Age of Onset   Hypertension Mother    Diabetes Mother    Hypertension Father   Reviewed on admission  Prior to Admission medications   Medication Sig Start Date End Date Taking? Authorizing Provider  amLODipine  (NORVASC ) 10 MG tablet Take 1 tablet (10 mg total) by mouth daily. 11/13/22  Yes Ghimire, Donalda HERO, MD  clopidogrel  (PLAVIX ) 75 MG tablet Take 1 tablet (75 mg total) by mouth daily. 11/14/22  Yes Ghimire, Donalda HERO, MD  Milk  Thistle 1000 MG CAPS Take 1 capsule by mouth daily.   Yes [provider]  OVER THE COUNTER MEDICATION Take 2 capsules by mouth daily. GlucoBio - 140mg  Mulberry Extract and Cinnamon   Yes [provider]  OVER THE COUNTER MEDICATION Take 2 capsules by mouth daily. Kerr-mcgee   Yes [provider]  OVER THE COUNTER MEDICATION Take 1 tablet by mouth daily. Body Magic - parsley, copper, peppermint extracts   Yes [provider]  OVER THE  COUNTER MEDICATION Take 1 tablet by mouth daily. Nicotinamide Riboside + Resveratrol 900mg    Yes [provider]  valsartan  (DIOVAN ) 320 MG tablet Take 1 tablet (320 mg total) by mouth daily. 11/13/22  Yes Ghimire, Donalda HERO, MD  BAYER CONTOUR NEXT TEST test strip  04/24/14   [provider]    Physical Exam: Vitals:   02/11/23 1530 02/11/23 1630 02/11/23 1700 02/11/23 1730  BP: (!) 187/93 (!) 187/99 (!) 170/89   Pulse: 89 79 81   Resp: 16 12 16    Temp:    98.9 F (37.2 C)  TempSrc:    Oral  SpO2: 100% 99% 100%   Weight:      Height:        Physical Exam Constitutional:      General: She is not in acute distress.    Appearance: Normal appearance.  HENT:     Head: Normocephalic and atraumatic.     Mouth/Throat:     Mouth: Mucous membranes are moist.     Pharynx: Oropharynx is clear.  Eyes:     Extraocular Movements: Extraocular movements intact.     Pupils: Pupils are equal, round, and reactive to light.  Cardiovascular:     Rate and Rhythm: Normal rate and regular rhythm.     Pulses: Normal pulses.     Heart sounds: Normal heart sounds.  Pulmonary:     Effort: Pulmonary effort is normal. No respiratory distress.     Breath sounds: Normal breath sounds.  Abdominal:     General: Bowel sounds are normal. There is no distension.     Palpations: Abdomen is soft.     Tenderness: There is no abdominal tenderness.  Musculoskeletal:        General: No swelling or deformity.  Skin:    General: Skin is warm and dry.  Neurological:     Comments: Mental Status: Patient is awake, alert, oriented No aphasia.  Left-sided neglect noted. Cranial Nerves: II: Pupils equal, round.   III,IV, VI: Delay in leftward gaze likely related to left-sided neglect. V: Facial sensation is symmetric to light touch. VII: Facial movement is symmetric.  VIII: hearing is intact to voice X: Uvula elevates symmetrically XI: Shoulder shrug is symmetric. XII: tongue is midline without  atrophy or fasciculations.  Motor: Good effort thorughout, at Least 5/5 right upper extremity and right lower extremity.  4-5/5 left upper extremity, 3-/5 left lower extremity. Sensory: Sensation is grossly intact bilateral UEs & LEs Cerebellar: Finger-Nose intact bilat; but due to neglect in order for her to perform finger-to-nose with left hand she needs to have her head turned to the left to see her left hand with her right visual field (was unable to do this with left hand while looking forward.).     Labs on Admission: I have personally reviewed following labs and imaging studies  CBC: Recent Labs  Lab 02/11/23 1208  WBC 6.0  NEUTROABS 3.9  HGB 14.0  HCT 43.2  MCV 86.2  PLT 185    Basic Metabolic Panel: Recent Labs  Lab 02/11/23 1208  NA 139  K 2.9*  CL 104  CO2 23  GLUCOSE 133*  BUN 19  CREATININE 1.13*  CALCIUM  8.8*    GFR: Estimated Creatinine Clearance: 34.1 mL/min (A) (by C-G formula based on SCr of 1.13 mg/dL (H)).  Liver Function Tests: Recent Labs  Lab 02/11/23 1208  AST 26  ALT 11  ALKPHOS 60  BILITOT 0.8  PROT 6.6  ALBUMIN 3.5    Urine analysis:    Component Value Date/Time   COLORURINE STRAW (A) 02/11/2023 1154   APPEARANCEUR CLEAR 02/11/2023 1154   LABSPEC 1.006 02/11/2023 1154   PHURINE 8.0 02/11/2023 1154   GLUCOSEU NEGATIVE 02/11/2023 1154   HGBUR NEGATIVE 02/11/2023 1154   BILIRUBINUR NEGATIVE 02/11/2023 1154   KETONESUR NEGATIVE 02/11/2023 1154   PROTEINUR NEGATIVE 02/11/2023 1154   UROBILINOGEN 0.2 01/18/2014 1350   NITRITE NEGATIVE 02/11/2023 1154   LEUKOCYTESUR NEGATIVE 02/11/2023 1154    Radiological Exams on Admission: CT CERVICAL SPINE WO CONTRAST Result Date: 02/11/2023 CLINICAL DATA:  Polytrauma, blunt; Mental status change, unknown cause. Fall. EXAM: CT HEAD WITHOUT CONTRAST CT CERVICAL SPINE WITHOUT CONTRAST TECHNIQUE: Multidetector CT imaging of the head and cervical spine was performed following the standard protocol  without intravenous contrast. Multiplanar CT image reconstructions of the cervical spine were also generated. RADIATION DOSE REDUCTION: This exam was performed according to the departmental dose-optimization program which includes automated exposure control, adjustment of the mA and/or kV according to patient size and/or use of iterative reconstruction technique. COMPARISON:  CTA head and neck 11/13/2022.  MRI head 11/12/2022. FINDINGS: CT HEAD FINDINGS Brain: There is a large acute or subacute right PCA infarct with cytotoxic edema resulting in mild mass effect on the right lateral ventricle. No intracranial hemorrhage or midline shift is evident. There is no extra-axial fluid collection or hydrocephalus. A moderate-sized chronic left parietal infarct and small chronic infarcts in the deep gray nuclei bilaterally, pons, and genu of the corpus callosum are again noted. There is a background of extensive chronic small vessel ischemia in the cerebral white matter. There is mild ex vacuo dilatation of the left lateral ventricle. Vascular: Calcified atherosclerosis at the skull base. Skull: No acute fracture or suspicious osseous lesion. Sinuses/Orbits: No significant inflammatory changes in the included portion of the paranasal sinuses or mastoid air cells. Bilateral cataract extraction. Right scleral buckle. Other: None. CT CERVICAL SPINE FINDINGS Alignment: Straightening of the normal cervical lordosis. Unchanged grade 1 anterolisthesis of C2 on C3 and C3 on C4 and grade 1 retrolisthesis of C5 on C6. 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 No acute fracture or suspicious osseous lesion. Soft tissues and spinal canal: No prevertebral fluid or swelling. No visible canal hematoma. Disc levels: Advanced disc degeneration at C5-6 and C7-T1. Advanced multilevel facet arthrosis. Moderate multilevel neural foraminal stenosis. Possible moderate spinal stenosis at C5-6. Upper chest: Emphysema and chronic  centrilobular micronodularity. Other: Atherosclerotic calcification at the right carotid bifurcation. Left carotid endarterectomy. IMPRESSION: 1. Large acute/subacute right PCA infarct. No hemorrhage. 2. No acute cervical spine fracture. 3.  Emphysema (ICD10-J43.9). Electronically Signed   By: Dasie Hamburg M.D.   On: 02/11/2023 16:33   CT HEAD WO CONTRAST ( ) Result Date: 02/11/2023 CLINICAL DATA:  Polytrauma, blunt; Mental status change, unknown cause. Fall. EXAM: CT HEAD WITHOUT CONTRAST CT CERVICAL SPINE WITHOUT CONTRAST TECHNIQUE: Multidetector CT imaging of the head and cervical spine was performed following  the standard protocol without intravenous contrast. Multiplanar CT image reconstructions of the cervical spine were also generated. RADIATION DOSE REDUCTION: This exam was performed according to the departmental dose-optimization program which includes automated exposure control, adjustment of the mA and/or kV according to patient size and/or use of iterative reconstruction technique. COMPARISON:  CTA head and neck 11/13/2022.  MRI head 11/12/2022. FINDINGS: CT HEAD FINDINGS Brain: There is a large acute or subacute right PCA infarct with cytotoxic edema resulting in mild mass effect on the right lateral ventricle. No intracranial hemorrhage or midline shift is evident. There is no extra-axial fluid collection or hydrocephalus. A moderate-sized chronic left parietal infarct and small chronic infarcts in the deep gray nuclei bilaterally, pons, and genu of the corpus callosum are again noted. There is a background of extensive chronic small vessel ischemia in the cerebral white matter. There is mild ex vacuo dilatation of the left lateral ventricle. Vascular: Calcified atherosclerosis at the skull base. Skull: No acute fracture or suspicious osseous lesion. Sinuses/Orbits: No significant inflammatory changes in the included portion of the paranasal sinuses or mastoid air cells. Bilateral cataract  extraction. Right scleral buckle. Other: None. CT CERVICAL SPINE FINDINGS Alignment: Straightening of the normal cervical lordosis. Unchanged grade 1 anterolisthesis of C2 on C3 and C3 on C4 and grade 1 retrolisthesis of C5 on C6. 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 No acute fracture or suspicious osseous lesion. Soft tissues and spinal canal: No prevertebral fluid or swelling. No visible canal hematoma. Disc levels: Advanced disc degeneration at C5-6 and C7-T1. Advanced multilevel facet arthrosis. Moderate multilevel neural foraminal stenosis. Possible moderate spinal stenosis at C5-6. Upper chest: Emphysema and chronic centrilobular micronodularity. Other: Atherosclerotic calcification at the right carotid bifurcation. Left carotid endarterectomy. IMPRESSION: 1. Large acute/subacute right PCA infarct. No hemorrhage. 2. No acute cervical spine fracture. 3.  Emphysema (ICD10-J43.9). Electronically Signed   By: Dasie Hamburg M.D.   On: 02/11/2023 16:33    EKG: Independently reviewed.  Sinus rhythm at 93 bpm.  Minimal baseline wander.  Nonspecific T wave flattening.  Assessment/Plan Active Problems:   Diabetes mellitus without complication (HCC)   Hyperlipidemia   Essential hypertension   Carotid artery disease (HCC)   S/P carotid endarterectomy   Hypokalemia   History of CVA (cerebrovascular accident)   Acute CVA History of CVA > Patient has history of CVA in October.  Has been intermittently compliant with aspirin  and Plavix . > Presenting with left arm heaviness, possible fall, slower with answers, left-sided vision loss. > Weakness has improved, but continues to have some left lower extremity weakness and subtle left upper extremity weakness.  Continues to have visual changes in the left visual field as well as some left-sided neglect. > CT head consistent with acute to subacute CVA.  Neurology consulted and CTA as well as MRI brain ordered. - Appreciate neurology recommendations  and assistance - Allow for permissive HTN  (systolic < 220 and diastolic < 120), for now pending chronicity determined by MRI - Continue Plavix  - Hold off on repeat echocardiogram  - Follow-up MRI brain and CTA head & neck  - A1C  - Lipid panel  - Tele monitoring  - SLP eval - PT/OT  Hypokalemia > Potassium incidentally 2.9 in the ED. > Received 20 mEq IV potassium - 40 mEq p.o. potassium - Check magnesium   Hypertension - Permissive hypertension as above  Carotid artery disease - Continue Plavix  as above  DVT prophylaxis: SCDs for now  Code Status:  Full Family Communication:  Updated at bedside  Disposition Plan:   Patient is from:  Home  Anticipated DC to:  Home  Anticipated DC date:  1 to 3 days  Anticipated DC barriers: None  Consults called:  Neurology Admission status:  Observation, telemetry  Severity of Illness: The appropriate patient status for this patient is OBSERVATION. Observation status is judged to be reasonable and necessary in order to provide the required intensity of service to ensure the patient's safety. The patient's presenting symptoms, physical exam findings, and initial radiographic and laboratory data in the context of their medical condition is felt to place them at decreased risk for further clinical deterioration. Furthermore, it is anticipated that the patient will be medically stable for discharge from the hospital within 2 midnights of admission.    Marsa KATHEE Scurry MD [facility]  How to contact the [facility] Attending or Consulting provider 7A - 7P or covering provider during after hours 7P -7A, for this patient?   Check the care team in [facility] and look for a) attending/consulting [facility] provider listed and b) the [facility] team listed Log into [email or web address] and use [place] universal password to access. If you do not have the password, please contact the hospital operator. Locate the [facility] provider you are looking for under [facility] and page to a number that you can be directly reached. If you still have difficulty reaching the provider, please page the [facility] (Director on Call) for the Hospitalists listed on amion for assistance.  02/11/2023, 5:34 PM

## 2023-02-11 NOTE — ED Notes (Signed)
 Attending notified that pt passed swallow screen

## 2023-02-11 NOTE — Consult Note (Signed)
 NEUROLOGY CONSULT NOTE   Date of service: February 11, 2023 Patient Name: Kathleen Figueroa MRN:  [id number] DOB:  September 09, 1935 Chief Complaint: L hemianopsia and L sided weakness Requesting Provider: Cottie Donnice PARAS, MD  History of Present Illness  Kathleen Figueroa is a 88 y.o. female with past medical history of hypertension, diabetes, carotid stenosis s/p left carotid endarterectomy and TIAs who presented to the ED with left sided weakness and vision deficit.  Patient reports that she got up sometime this morning between 5 and 6 AM.  She got up this early so she could be in a Zoom session.  She walked short distance before she fell and she passed out.  She remember falling on the floor and she was unable to get up as her left side was given her trouble.  Left side felt very heavy.  Family called EMS when she was brought to the ED where she was noted to have left hemianopsia.  She had further evaluation with CT head without contrast which is concerning for a right PCA acute/subacute infarct.  LKW: Unclear as patient did not even realize she had a vision deficit until it was pointed out on the exam. Modified rankin score: 3-Moderate disability-requires help but walks WITHOUT assistance IV Thrombolysis: Not offered due to unclear last known well.   EVT: Not offered as a stroke appears completed.    NIHSS components Score: Comment  1a Level of Conscious 0[x]  1[]  2[]  3[]      1b LOC Questions 0[x]  1[]  2[]       1c LOC Commands 0[x]  1[]  2[]       2 Best Gaze 0[x]  1[]  2[]       3 Visual 0[]  1[]  2[x]  3[]      4 Facial Palsy 0[x]  1[]  2[]  3[]      5a Motor Arm - left 0[x]  1[]  2[]  3[]  4[]  UN[]    5b Motor Arm - Right 0[x]  1[]  2[]  3[]  4[]  UN[]    6a Motor Leg - Left 0[x]  1[]  2[]  3[]  4[]  UN[]    6b Motor Leg - Right 0[x]  1[]  2[]  3[]  4[]  UN[]    7 Limb Ataxia 0[x]  1[]  2[]  3[]  UN[]     8 Sensory 0[]  1[x]  2[]  UN[]      9 Best Language 0[x]  1[]  2[]  3[]      10 Dysarthria 0[x]  1[]  2[]  UN[]      11 Extinct. and  Inattention 0[x]  1[]  2[]       TOTAL: 3      ROS  Comprehensive ROS performed and pertinent positives documented in HPI   Past History   Past Medical History:  Diagnosis Date   Acute renal failure (ARF) (HCC) 05/06/2018   Arthritis    Diabetes mellitus type 2, uncontrolled, 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   H/O detached retina repair    Hypertension    TIA (transient ischemic attack)     Past Surgical History:  Procedure Laterality Date   ENDARTERECTOMY Left 01/19/2014   Procedure: ENDARTERECTOMY CAROTID-LEFT;  Surgeon: Gaile LELON New, MD;  Location: [facility] OR;  Service: Vascular;  Laterality: Left;   EYE SURGERY     lens removed from right eye   PATCH ANGIOPLASTY Left 01/19/2014   Procedure: PATCH ANGIOPLASTY, LEFT CAROTID ARTERY USING HEMASHIELD PLATINUM FINESSE PATCH;  Surgeon: Gaile LELON New, MD;  Location: [facility] OR;  Service: Vascular;  Laterality: Left;   TONSILLECTOMY      Family History: Family History  Problem Relation Age of Onset   Hypertension Mother    Diabetes Mother    Hypertension  Father     Social History  reports that she quit smoking about 45 years ago. Her smoking use included cigarettes. She has never used smokeless tobacco. She reports that she does not drink alcohol  and does not use drugs.  Allergies  Allergen Reactions   Aspirin  Nausea Only    Higher dose    Medications   Current Facility-Administered Medications:    0.9 % NaCl with KCl 20 mEq/ L  infusion, , Intravenous, Once, Kathleen Charleston, MD, Last Rate: 500 mL/hr at 02/11/23 1545, New Bag at 02/11/23 1545   clopidogrel  (PLAVIX ) tablet 75 mg, 75 mg, Oral, Daily, Kathleen Boxx, MD  Current Outpatient Medications:    amLODipine  (NORVASC ) 10 MG tablet, Take 1 tablet (10 mg total) by mouth daily., Disp: 30 tablet, Rfl: 0   clopidogrel  (PLAVIX ) 75 MG tablet, Take 1 tablet (75 mg total) by mouth daily., Disp: 30 tablet, Rfl: 1   Milk Thistle 1000 MG CAPS, Take 1 capsule by mouth daily.,  Disp: , Rfl:    OVER THE COUNTER MEDICATION, Take 2 capsules by mouth daily. GlucoBio - 140mg  Mulberry Extract and Cinnamon, Disp: , Rfl:    OVER THE COUNTER MEDICATION, Take 2 capsules by mouth daily. Kerr-mcgee, Disp: , Rfl:    OVER THE COUNTER MEDICATION, Take 1 tablet by mouth daily. Body Magic - parsley, copper, peppermint extracts, Disp: , Rfl:    OVER THE COUNTER MEDICATION, Take 1 tablet by mouth daily. Nicotinamide Riboside + Resveratrol 900mg , Disp: , Rfl:    valsartan  (DIOVAN ) 320 MG tablet, Take 1 tablet (320 mg total) by mouth daily., Disp: 30 tablet, Rfl: 0   BAYER CONTOUR NEXT TEST test strip, , Disp: , Rfl:   Vitals   Vitals:   02/11/23 1345 02/11/23 1430 02/11/23 1530 02/11/23 1630  BP: (!) 167/89 (!) 168/88 (!) 187/93 (!) 187/99  Pulse: 87 84 89 79  Resp: 13 19 16 12   Temp:      TempSrc:      SpO2: 100% 99% 100% 99%  Weight:      Height:        Body mass index is 22.6 kg/m.  Physical Exam   General: Laying comfortably in bed; in no acute distress.  HENT: Normal oropharynx and mucosa. Normal external appearance of ears and nose.  Neck: Supple, no pain or tenderness  CV: No JVD. No peripheral edema.  Pulmonary: Symmetric Chest rise. Normal respiratory effort.  Abdomen: Soft to touch, non-tender.  Ext: No cyanosis, edema, or deformity  Skin: No rash. Normal palpation of skin.   Musculoskeletal: Normal digits and nails by inspection. No clubbing.   Neurologic Examination  Mental status/Cognition: Alert, oriented to self, place, month and year, good attention.  Speech/language: Fluent, comprehension intact, object naming intact, repetition intact.  Cranial nerves:   CN II Pupils equal and reactive to light, left hemianopsia.   CN III,IV,VI EOM intact, no gaze preference or deviation, no nystagmus    CN V normal sensation in V1, V2, and V3 segments bilaterally    CN VII no asymmetry, no nasolabial fold flattening    CN VIII normal hearing to speech    CN  IX & X normal palatal elevation, no uvular deviation    CN XI 5/5 head turn and 5/5 shoulder shrug bilaterally    CN XII midline tongue protrusion    Motor:  Muscle bulk: poor, tone normal, pronator drift none tremor none Mvmt Root Nerve  Muscle Right Left Comments  SA C5/6 Ax Deltoid 5 5   EF C5/6 Mc Biceps 5 5   EE C6/7/8 Rad Triceps 5 5   WF C6/7 Med FCR     WE C7/8 PIN ECU     F Ab C8/T1 U ADM/FDI 5 5   HF L1/2/3 Fem Illopsoas 5 5   KE L2/3/4 Fem Quad 5 5   DF L4/5 D Peron Tib Ant 5 5   PF S1/2 Tibial Grc/Sol 5 5    Sensation:  Light touch Slightly decreased in left faceto touch   Pin prick    Temperature    Vibration   Proprioception    Coordination/Complex Motor:  - Finger to Nose intact BL - Heel to shin intact BL - Rapid alternating movement are slowed - Gait: deferred.  Labs/Imaging/Neurodiagnostic studies   CBC:  Recent Labs  Lab 06-Mar-2023 1208  WBC 6.0  NEUTROABS 3.9  HGB 14.0  HCT 43.2  MCV 86.2  PLT 185   Basic Metabolic Panel:  Lab Results  Component Value Date   NA 139 03/06/2023   K 2.9 (L) 03-06-23   CO2 23 03-06-2023   GLUCOSE 133 (H) 03/06/23   BUN 19 03-06-23   CREATININE 1.13 (H) 2023/03/06   CALCIUM  8.8 (L) 03/06/2023   GFRNONAA 48 (L) 03/06/2023   GFRAA 37 (L) 05/06/2018   Lipid Panel:  Lab Results  Component Value Date   LDLCALC 145 (H) 11/13/2022   HgbA1c:  Lab Results  Component Value Date   HGBA1C 6.5 (H) 11/13/2022   Urine Drug Screen:     Component Value Date/Time   LABOPIA NONE DETECTED March 06, 2023 1154   COCAINSCRNUR NONE DETECTED 03-06-23 1154   LABBENZ NONE DETECTED 2023-03-06 1154   AMPHETMU NONE DETECTED 06-Mar-2023 1154   THCU NONE DETECTED 06-Mar-2023 1154   LABBARB NONE DETECTED Mar 06, 2023 1154    Alcohol  Level     Component Value Date/Time   ETH <10 2023/03/06 1208   INR  Lab Results  Component Value Date   INR 1.0 Mar 06, 2023   APTT  Lab Results  Component Value Date   APTT 31  03-06-2023   AED levels: No results found for: PHENYTOIN, ZONISAMIDE, LAMOTRIGINE, LEVETIRACETA  CT Head without contrast(Personally reviewed): Acute/subacute R PCA stroke.  CT angio Head and Neck with contrast(Personally reviewed): pending   MRI Brain(Personally reviewed): Pending  ASSESSMENT   Cannie Muckle is a 88 y.o. female with past medical history of hypertension, diabetes, carotid stenosis s/p left carotid endarterectomy and TIAs who presented to the ED with left sided weakness and vision deficit.  Weakness has resolved she has persistent left hemianopsia however.  CT head reveals a right PCA acute/subacute infarct.  She was not a candidate for TNKase due to unclear last known well and neither patient nor family realized that she had a left hemianopsia and so it is unclear when this actually started.  She is not a candidate for thrombectomy as a stroke appears completed on CT head.  RECOMMENDATIONS  - Frequent Neuro checks per stroke unit protocol - Recommend brain imaging with MRI Brain without contrast - Recommend Vascular imaging with CTA head and neck - Recommend obtaining TTE - Recommend obtaining Lipid panel with LDL - Please start statin if LDL > 70 - Recommend HbA1c to evaluate for diabetes and how well it is controlled. - Antithrombotic -Plavix  75 mg daily - Recommend DVT ppx - SBP goal - permissive hypertension first 24 h < 220/110. Held home meds.  - Recommend  Telemetry monitoring for arrythmia - Recommend bedside swallow screen prior to PO intake. - Stroke education booklet - Recommend PT/OT/SLP consult - stroke team to follow.  ______________________________________________________________________    Signed, Tylerjames Hoglund, MD [facility]

## 2023-02-11 NOTE — ED Notes (Signed)
 Pt to MRI

## 2023-02-11 NOTE — ED Triage Notes (Signed)
 Pt to ED via [facility] c/o found down by daughter, unknown down time.  Last known well 7pm last night. Pt has hx of stroke with weakness deficit. Presents today with left side deficit.  Pt been off scheduled medications x 3 days.  Last VS: CBG150, hr100, 18rr. 220/110 , 99%RA  #18LAC No medications given by EMS.   A&Ox 4

## 2023-02-11 NOTE — Significant Event (Signed)
 Radiologist called me with the results of CT angiogram head and neck and MRI brain.  I discussed with the on-call neurologist Dr. Jerrie Meier.  Neurologist advised adding aspirin  to the Plavix  since aspirin  allergy is only noted as nausea.  Discussed with patient and family agreeable to adding aspirin  in addition to Plavix .  Redia Cleaver.

## 2023-02-11 NOTE — ED Notes (Signed)
 Pt returned from MRI. Replaced gown and placed on cardiac monitor. Pt denies pain.

## 2023-02-12 ENCOUNTER — Observation Stay (HOSPITAL_COMMUNITY): Payer: Medicare HMO

## 2023-02-12 DIAGNOSIS — R414 Neurologic neglect syndrome: Secondary | ICD-10-CM | POA: Diagnosis present

## 2023-02-12 DIAGNOSIS — I639 Cerebral infarction, unspecified: Secondary | ICD-10-CM | POA: Diagnosis not present

## 2023-02-12 DIAGNOSIS — E1165 Type 2 diabetes mellitus with hyperglycemia: Secondary | ICD-10-CM | POA: Diagnosis present

## 2023-02-12 DIAGNOSIS — E1169 Type 2 diabetes mellitus with other specified complication: Secondary | ICD-10-CM | POA: Diagnosis not present

## 2023-02-12 DIAGNOSIS — I63331 Cerebral infarction due to thrombosis of right posterior cerebral artery: Secondary | ICD-10-CM | POA: Diagnosis not present

## 2023-02-12 DIAGNOSIS — W19XXXA Unspecified fall, initial encounter: Secondary | ICD-10-CM | POA: Diagnosis present

## 2023-02-12 DIAGNOSIS — M25512 Pain in left shoulder: Secondary | ICD-10-CM | POA: Diagnosis not present

## 2023-02-12 DIAGNOSIS — N3942 Incontinence without sensory awareness: Secondary | ICD-10-CM | POA: Diagnosis not present

## 2023-02-12 DIAGNOSIS — N182 Chronic kidney disease, stage 2 (mild): Secondary | ICD-10-CM | POA: Diagnosis not present

## 2023-02-12 DIAGNOSIS — I619 Nontraumatic intracerebral hemorrhage, unspecified: Secondary | ICD-10-CM | POA: Diagnosis present

## 2023-02-12 DIAGNOSIS — I2510 Atherosclerotic heart disease of native coronary artery without angina pectoris: Secondary | ICD-10-CM | POA: Diagnosis present

## 2023-02-12 DIAGNOSIS — I129 Hypertensive chronic kidney disease with stage 1 through stage 4 chronic kidney disease, or unspecified chronic kidney disease: Secondary | ICD-10-CM | POA: Diagnosis present

## 2023-02-12 DIAGNOSIS — I63532 Cerebral infarction due to unspecified occlusion or stenosis of left posterior cerebral artery: Secondary | ICD-10-CM | POA: Diagnosis not present

## 2023-02-12 DIAGNOSIS — Z7985 Long-term (current) use of injectable non-insulin antidiabetic drugs: Secondary | ICD-10-CM | POA: Diagnosis not present

## 2023-02-12 DIAGNOSIS — I10 Essential (primary) hypertension: Secondary | ICD-10-CM | POA: Diagnosis not present

## 2023-02-12 DIAGNOSIS — I69334 Monoplegia of upper limb following cerebral infarction affecting left non-dominant side: Secondary | ICD-10-CM | POA: Diagnosis not present

## 2023-02-12 DIAGNOSIS — N1831 Chronic kidney disease, stage 3a: Secondary | ICD-10-CM | POA: Diagnosis present

## 2023-02-12 DIAGNOSIS — Z7902 Long term (current) use of antithrombotics/antiplatelets: Secondary | ICD-10-CM | POA: Diagnosis not present

## 2023-02-12 DIAGNOSIS — Z79899 Other long term (current) drug therapy: Secondary | ICD-10-CM | POA: Diagnosis not present

## 2023-02-12 DIAGNOSIS — E876 Hypokalemia: Secondary | ICD-10-CM | POA: Diagnosis present

## 2023-02-12 DIAGNOSIS — Z794 Long term (current) use of insulin: Secondary | ICD-10-CM | POA: Diagnosis not present

## 2023-02-12 DIAGNOSIS — G936 Cerebral edema: Secondary | ICD-10-CM | POA: Diagnosis present

## 2023-02-12 DIAGNOSIS — R471 Dysarthria and anarthria: Secondary | ICD-10-CM | POA: Diagnosis present

## 2023-02-12 DIAGNOSIS — K5900 Constipation, unspecified: Secondary | ICD-10-CM | POA: Diagnosis not present

## 2023-02-12 DIAGNOSIS — R209 Unspecified disturbances of skin sensation: Secondary | ICD-10-CM | POA: Diagnosis not present

## 2023-02-12 DIAGNOSIS — Z7984 Long term (current) use of oral hypoglycemic drugs: Secondary | ICD-10-CM | POA: Diagnosis not present

## 2023-02-12 DIAGNOSIS — R29703 NIHSS score 3: Secondary | ICD-10-CM | POA: Diagnosis present

## 2023-02-12 DIAGNOSIS — H53462 Homonymous bilateral field defects, left side: Secondary | ICD-10-CM | POA: Diagnosis present

## 2023-02-12 DIAGNOSIS — I69398 Other sequelae of cerebral infarction: Secondary | ICD-10-CM | POA: Diagnosis not present

## 2023-02-12 DIAGNOSIS — G8194 Hemiplegia, unspecified affecting left nondominant side: Secondary | ICD-10-CM | POA: Diagnosis present

## 2023-02-12 DIAGNOSIS — M792 Neuralgia and neuritis, unspecified: Secondary | ICD-10-CM | POA: Diagnosis not present

## 2023-02-12 DIAGNOSIS — Z7982 Long term (current) use of aspirin: Secondary | ICD-10-CM | POA: Diagnosis not present

## 2023-02-12 DIAGNOSIS — E1122 Type 2 diabetes mellitus with diabetic chronic kidney disease: Secondary | ICD-10-CM | POA: Diagnosis present

## 2023-02-12 DIAGNOSIS — R911 Solitary pulmonary nodule: Secondary | ICD-10-CM | POA: Diagnosis present

## 2023-02-12 DIAGNOSIS — E872 Acidosis, unspecified: Secondary | ICD-10-CM | POA: Diagnosis not present

## 2023-02-12 DIAGNOSIS — E785 Hyperlipidemia, unspecified: Secondary | ICD-10-CM | POA: Diagnosis present

## 2023-02-12 DIAGNOSIS — K5901 Slow transit constipation: Secondary | ICD-10-CM | POA: Diagnosis not present

## 2023-02-12 DIAGNOSIS — G35 Multiple sclerosis: Secondary | ICD-10-CM | POA: Diagnosis present

## 2023-02-12 DIAGNOSIS — I63531 Cerebral infarction due to unspecified occlusion or stenosis of right posterior cerebral artery: Secondary | ICD-10-CM | POA: Diagnosis present

## 2023-02-12 DIAGNOSIS — E1151 Type 2 diabetes mellitus with diabetic peripheral angiopathy without gangrene: Secondary | ICD-10-CM | POA: Diagnosis present

## 2023-02-12 DIAGNOSIS — M19012 Primary osteoarthritis, left shoulder: Secondary | ICD-10-CM | POA: Diagnosis not present

## 2023-02-12 LAB — BASIC METABOLIC PANEL
Anion gap: 16 — ABNORMAL HIGH (ref 5–15)
BUN: 14 mg/dL (ref 8–23)
CO2: 25 mmol/L (ref 22–32)
Calcium: 9.2 mg/dL (ref 8.9–10.3)
Chloride: 97 mmol/L — ABNORMAL LOW (ref 98–111)
Creatinine, Ser: 1.22 mg/dL — ABNORMAL HIGH (ref 0.44–1.00)
GFR, Estimated: 43 mL/min — ABNORMAL LOW (ref >60)
Glucose, Bld: 145 mg/dL — ABNORMAL HIGH (ref 70–99)
Potassium: 3.2 mmol/L — ABNORMAL LOW (ref 3.5–5.1)
Sodium: 138 mmol/L (ref 135–145)

## 2023-02-12 LAB — CBC WITH DIFFERENTIAL/PLATELET
Abs Immature Granulocytes: 0.02 10*3/uL (ref 0.00–0.07)
Basophils Absolute: 0 10*3/uL (ref 0.0–0.1)
Basophils Relative: 1 %
Eosinophils Absolute: 0.1 10*3/uL (ref 0.0–0.5)
Eosinophils Relative: 1 %
HCT: 46.5 % — ABNORMAL HIGH (ref 36.0–46.0)
Hemoglobin: 15.2 g/dL — ABNORMAL HIGH (ref 12.0–15.0)
Immature Granulocytes: 0 %
Lymphocytes Relative: 22 %
Lymphs Abs: 1.2 10*3/uL (ref 0.7–4.0)
MCH: 28.2 pg (ref 26.0–34.0)
MCHC: 32.7 g/dL (ref 30.0–36.0)
MCV: 86.3 fL (ref 80.0–100.0)
Monocytes Absolute: 0.7 10*3/uL (ref 0.1–1.0)
Monocytes Relative: 14 %
Neutro Abs: 3.3 10*3/uL (ref 1.7–7.7)
Neutrophils Relative %: 62 %
Platelets: 191 10*3/uL (ref 150–400)
RBC: 5.39 MIL/uL — ABNORMAL HIGH (ref 3.87–5.11)
RDW: 14.2 % (ref 11.5–15.5)
WBC: 5.3 10*3/uL (ref 4.0–10.5)
nRBC: 0 % (ref 0.0–0.2)

## 2023-02-12 LAB — LIPID PANEL
Cholesterol: 276 mg/dL — ABNORMAL HIGH (ref 0–200)
HDL: 66 mg/dL (ref >40)
LDL Cholesterol: 193 mg/dL — ABNORMAL HIGH (ref 0–99)
Total CHOL/HDL Ratio: 4.2 {ratio}
Triglycerides: 86 mg/dL (ref <150)
VLDL: 17 mg/dL (ref 0–40)

## 2023-02-12 LAB — MAGNESIUM: Magnesium: 1.7 mg/dL (ref 1.7–2.4)

## 2023-02-12 MED ORDER — TICAGRELOR 90 MG PO TABS
90.0000 mg | ORAL_TABLET | Freq: Two times a day (BID) | ORAL | Status: DC
  Administered 2023-02-12 – 2023-02-18 (×12): 90 mg via ORAL
  Filled 2023-02-12 (×12): qty 1

## 2023-02-12 MED ORDER — ATORVASTATIN CALCIUM 80 MG PO TABS
80.0000 mg | ORAL_TABLET | Freq: Every day | ORAL | Status: DC
  Administered 2023-02-12 – 2023-02-18 (×7): 80 mg via ORAL
  Filled 2023-02-12 (×7): qty 1

## 2023-02-12 NOTE — Evaluation (Signed)
 Physical Therapy Evaluation Patient Details Name: Kathleen Figueroa MRN: [id number] DOB: 05/21/1935 Today's Date: 02/12/2023  History of Present Illness  88 y.o. female presented to ED 1/2 with left sided weakness and vision deficit., possible fall. PMHx: hypertension, hyperlipidemia, diabetes, carotid artery disease, CVA.   Clinical Impression  Pt admitted with above diagnosis. Required up to mod assist +2 for gait, LLE very ataxic but strength preserved. Impaired sensation throughout L UE and LE. Does not have 24/7 assist available and will therefore benefit from SNF at d/c to maximize independence and functional abilities. Pt currently with functional limitations due to the deficits listed below (see PT Problem List). Pt will benefit from acute skilled PT to increase their independence and safety with mobility to allow discharge.           If plan is discharge home, recommend the following: Two people to help with walking and/or transfers;A lot of help with bathing/dressing/bathroom;Assistance with cooking/housework;Direct supervision/assist for medications management;Assist for transportation   Can travel by private vehicle   No    Equipment Recommendations None recommended by PT (TBD next venue)  Recommendations for Other Services       Functional Status Assessment Patient has had a recent decline in their functional status and demonstrates the ability to make significant improvements in function in a reasonable and predictable amount of time.     Precautions / Restrictions Precautions Precautions: Fall Precaution Comments: left hemiparesis Restrictions Weight Bearing Restrictions Per Provider Order: No      Mobility  Bed Mobility               General bed mobility comments: EOB with OT when PT entered room    Transfers Overall transfer level: Needs assistance Equipment used: 2 person hand held assist Transfers: Sit to/from Stand Sit to Stand: Min assist, +2  physical assistance, From elevated surface           General transfer comment: Min A +2 for sit to stand transition. bil hand held assist for support, pushing heavily through RUE. Impaired placement and proprioceptive awareness of LLE.    Ambulation/Gait Ambulation/Gait assistance: Mod assist, +2 physical assistance Gait Distance (Feet): 18 Feet Assistive device: 2 person hand held assist Gait Pattern/deviations: Step-to pattern, Step-through pattern, Decreased stance time - left, Decreased dorsiflexion - left, Decreased weight shift to left, Ataxic, Wide base of support, Drifts right/left Gait velocity: dec Gait velocity interpretation: <1.31 ft/sec, indicative of household ambulator Pre-gait activities: weight shift, forward/retro steps General Gait Details: Up to mod assist +2 at times for balance and coordination of step sequence. Max VC initially to advance LLE - very ataxic with LLE. Leans heavily onto RUE for support, aided by therapist to stabilize. Minor buckling at times. Pt states she cannot feel any weight through LLE.  Stairs            Wheelchair Mobility     Tilt Bed    Modified Rankin (Stroke Patients Only)       Balance Overall balance assessment: Needs assistance Sitting-balance support: No upper extremity supported, Feet supported Sitting balance-Leahy Scale: Fair Sitting balance - Comments: CGA   Standing balance support: Bilateral upper extremity supported Standing balance-Leahy Scale: Poor                               Pertinent Vitals/Pain      Home Living Family/patient expects to be discharged to:: Private residence Living Arrangements: Children;Other relatives (  daughter and son-in-law) Available Help at Discharge: Available PRN/intermittently (both daughter and son-in law work) Type of Home: Apartment Home Access: Level entry       Home Layout: One level Home Equipment: Cane - single point;Cane - Programmer[employer] (2  wheels);Shower seat Additional Comments: works as a Artist Prior Level of Function : Independent/Modified Independent             Mobility Comments: Used the cane and walker sometimes since the CVA. States she was getting HHPT about 2x/week and OT sporadically ADLs Comments: Does her own meal prep when family is not there.     Extremity/Trunk Assessment   Upper Extremity Assessment Upper Extremity Assessment: LUE deficits/detail LUE Deficits / Details: Increased tone/resistance at rest with pt holding UE in flexion.  When asked she could demonstrate full ROM in all joints but with decreased smoothness and speed of movement.  Decreased ability to grade movements. LUE Sensation: decreased light touch;decreased proprioception LUE Coordination: decreased fine motor;decreased gross motor    Lower Extremity Assessment Lower Extremity Assessment: Defer to PT evaluation LLE Deficits / Details: Strength preserved throughout. Unable to feel light touch and deep pressure throughout, with a few areas (lateral shin,  anterior knee/thigh) mildly preserved but sporadic. Impaired coordination. LLE Sensation: decreased light touch;decreased proprioception LLE Coordination: decreased fine motor;decreased gross motor    Cervical / Trunk Assessment Cervical / Trunk Assessment: Normal  Communication   Communication Communication: Hearing impairment  Cognition Arousal: Alert Behavior During Therapy: WFL for tasks assessed/performed Overall Cognitive Status: No family/caregiver present to determine baseline cognitive functioning                                          General Comments      Exercises     Assessment/Plan    PT Assessment Patient needs continued PT services  PT Problem List Decreased strength;Decreased activity tolerance;Decreased balance;Decreased mobility;Decreased coordination;Decreased knowledge of use of DME;Decreased  knowledge of precautions;Impaired sensation;Impaired tone       PT Treatment Interventions DME instruction;Gait training;Functional mobility training;Therapeutic activities;Therapeutic exercise;Balance training;Neuromuscular re-education;Patient/family education;Wheelchair mobility training    PT Goals (Current goals can be found in the Care Plan section)  Acute Rehab PT Goals Patient Stated Goal: Get well PT Goal Formulation: With patient Time For Goal Achievement: 02/26/23 Potential to Achieve Goals: Good    Frequency Min 1X/week     Co-evaluation PT/OT/SLP Co-Evaluation/Treatment: Yes Reason for Co-Treatment: Complexity of the patient's impairments (multi-system involvement);To address functional/ADL transfers;For patient/therapist safety PT goals addressed during session: Mobility/safety with mobility;Balance;Proper use of DME;Strengthening/ROM OT goals addressed during session: ADL's and self-care       AM-PAC PT 6 Clicks Mobility  Outcome Measure Help needed turning from your back to your side while in a flat bed without using bedrails?: A Little Help needed moving from lying on your back to sitting on the side of a flat bed without using bedrails?: A Lot Help needed moving to and from a bed to a chair (including a wheelchair)?: A Lot Help needed standing up from a chair using your arms (e.g., wheelchair or bedside chair)?: A Lot Help needed to walk in hospital room?: Total Help needed climbing 3-5 steps with a railing? : Total 6 Click Score: 11    End of Session Equipment Utilized During Treatment: Gait belt Activity Tolerance: Patient tolerated treatment well  Patient left: in bed;with call bell/phone within reach Nurse Communication: Mobility status PT Visit Diagnosis: Unsteadiness on feet (R26.81);Other abnormalities of gait and mobility (R26.89);Hemiplegia and hemiparesis;Difficulty in walking, not elsewhere classified (R26.2);Ataxic gait (R26.0) Hemiplegia -  Right/Left: Left    Time: 8879-8851 PT Time Calculation (min) (ACUTE ONLY): 28 min   Charges:   PT Evaluation $PT Eval Moderate Complexity: 1 Mod   PT General Charges $$ ACUTE PT VISIT: 1 Visit         Leontine Roads, PT, DPT [facility]  Rehabilitation Services Physical Therapist Office: [phone] Website: [email or web address]   Leontine GORMAN Roads 02/12/2023, 1:42 PM

## 2023-02-12 NOTE — Evaluation (Signed)
 Occupational Therapy Evaluation Patient Details Name: Kathleen Figueroa MRN: [id number] DOB: September 30, 1935 Today's Date: 02/12/2023   History of Present Illness 88 y.o. female presented to ED 1/2 with left sided weakness and vision deficit., possible fall. PMHx: hypertension, hyperlipidemia, diabetes, carotid artery disease, CVA.   Clinical Impression   Pt currently at mod +2 assist for LB selfcare sit to stand and for functional transfers without an assistive device.  She exhibits extensive proprioceptive deficits in the LUE and LLE as well as motor planning and left neglect.  Unsure how much of this was present from previous CVA but now much more exacerbated if so.  Prior to admission pt was living with her daughter and son-in-law and she was alone for periods of time since the both work per chart and pt's report.  Feel she will benefit from acute care OT at this time to work on progressing to a safer level with selfcare tasks and functional transfers as well as working on visual compensation, LUE and LLE strengthening and coordination, and safety.  Recommend extensive continued inpatient follow up therapy, <3 hours/day post acute stay since pt does not have 24 hour supervision/assist per her report.      If plan is discharge home, recommend the following: A lot of help with walking and/or transfers;A lot of help with bathing/dressing/bathroom;Assistance with cooking/housework;Supervision due to cognitive status    Functional Status Assessment  Patient has had a recent decline in their functional status and demonstrates the ability to make significant improvements in function in a reasonable and predictable amount of time.  Equipment Recommendations  Other (comment) (TBD next venue of care)       Precautions / Restrictions Precautions Precautions: Fall Precaution Comments: left hemiparesis Restrictions Weight Bearing Restrictions Per Provider Order: No      Mobility Bed Mobility Overal bed  mobility: Needs Assistance Bed Mobility: Supine to Sit     Supine to sit: Min assist     General bed mobility comments: Assist with bringing trunk up to sitting and hips around to the surface.    Transfers Overall transfer level: Needs assistance Equipment used: 2 person hand held assist Transfers: Sit to/from Stand, Bed to chair/wheelchair/BSC Sit to Stand: Min assist     Step pivot transfers: Mod assist, +2 safety/equipment     General transfer comment: Mod +2 for functional mobility without assistive device.  Pt with motor planning and proprioceptive deficits in the LLE, stating she couldn't tell she was standing on it.  Mod assist needed at times to help with initiation and placement of the LLE.      Balance Overall balance assessment: Needs assistance   Sitting balance-Leahy Scale: Fair     Standing balance support: Bilateral upper extremity supported Standing balance-Leahy Scale: Poor Standing balance comment: Pt needs extensive assist for mobility                           ADL either performed or assessed with clinical judgement   ADL Overall ADL's : Needs assistance/impaired Eating/Feeding: Minimal assistance;Sitting   Grooming: Wash/dry hands;Wash/dry face;Sitting;Minimal assistance Grooming Details (indicate cue type and reason): simulated Upper Body Bathing: Minimal assistance;Sitting Upper Body Bathing Details (indicate cue type and reason): simulated Lower Body Bathing: Moderate assistance;+2 for physical assistance;Sit to/from stand   Upper Body Dressing : Moderate assistance;Sitting   Lower Body Dressing: Moderate assistance;Sit to/from stand;+2 for physical assistance   Toilet Transfer: +2 for physical assistance;Ambulation;Moderate assistance   Toileting-  Clothing Manipulation and Hygiene: Moderate assistance;+2 for physical assistance;Sit to/from stand       Functional mobility during ADLs: +2 for physical assistance;Moderate  assistance (functional mobility with no assistive device in the room) General ADL Comments: Pt with prioproceptive and motor planning deficits on the left side as well as severe left hemianopsia.  Decreased ability to advance the LLE with mobility leading to left rotation with pt needing mod assist at times to place it.  Flexed pattern maintained at the left elbow with standing but able to functionally reach with the UE and attempt use in sitting.  With donning socks in sitting, pt used the RUE only and one handed technique.     Vision Baseline Vision/History: 1 Wears glasses (reading per report) Ability to See in Adequate Light: 0 Adequate Patient Visual Report: Other (comment) (pt reports trouble seeing to the left) Vision Assessment?: Yes Eye Alignment: Impaired (comment) (gaze to the right) Ocular Range of Motion: Restricted on the left (decreased scanning across midline to the left) Alignment/Gaze Preference: Head turned;Gaze right Tracking/Visual Pursuits: Other (comment) (lost fixation when tracking left of midline) Visual Fields: Left homonymous hemianopsia Additional Comments: Pt with severe left hemianopsia, not able to see anything peripherally until it was almost at midline from the left side.  Keeps head and eyes turned to the right, however will scan across midline to the left but not far left and maintain.     Perception Perception: Impaired Preception Impairment Details: Spatial orientation, Inattention/Neglect Perception-Other Comments: Left neglect   Praxis Praxis: Impaired Praxis Impairment Details: Ideomotor Praxis-Other Comments: motor impersistence with difficulty grading LLE and LUE movement   Pertinent Vitals/Pain Pain Assessment Pain Assessment: No/denies pain     Extremity/Trunk Assessment Upper Extremity Assessment Upper Extremity Assessment: LUE deficits/detail LUE Deficits / Details: Increased tone/resistance at rest with pt holding UE in flexion.  When  asked she could demonstrate full ROM in all joints but with decreased smoothness and speed of movement.  Decreased ability to grade movements. LUE Sensation: decreased light touch;decreased proprioception LUE Coordination: decreased fine motor;decreased gross motor   Lower Extremity Assessment Lower Extremity Assessment: Defer to PT evaluation LLE Deficits / Details: Strength preserved throughout. Unable to feel light touch and deep pressure throughout, with a few areas (lateral shin,  anterior knee/thigh) mildly preserved but sporadic. Impaired coordination. LLE Sensation: decreased light touch;decreased proprioception LLE Coordination: decreased fine motor;decreased gross motor   Cervical / Trunk Assessment Cervical / Trunk Assessment: Normal   Communication Communication Communication: Hearing impairment   Cognition Arousal: Alert Behavior During Therapy: WFL for tasks assessed/performed Overall Cognitive Status: No family/caregiver present to determine baseline cognitive functioning                                 General Comments: Pt with some delays when answering questions.  Oriented to place, month, and year but missed day of the week X1.  She was unable to state deficits from this CVA vs her baseline from last CVA.                Home Living Family/patient expects to be discharged to:: Private residence Living Arrangements: Children;Other relatives (daughter and son-in-law) Available Help at Discharge: Available PRN/intermittently (both daughter and son-in law work) Type of Home: Apartment Home Access: Level entry     Home Layout: One level     Bathroom Shower/Tub: Chief Strategy Officer: Standard  Home Equipment: [place] - single point;Cane - [employer] (2 wheels);Shower seat   Additional Comments: works as a Emergency Planning/management Officer Prior Level of Function : Independent/Modified Independent              Mobility Comments: Used the cane and walker sometimes since the CVA. States she was getting HHPT about 2x/week and OT sporadically ADLs Comments: Does her own meal prep when family is not there.        OT Problem List: Decreased strength;Decreased knowledge of use of DME or AE;Impaired tone;Impaired vision/perception;Decreased coordination;Decreased cognition;Impaired balance (sitting and/or standing);Decreased safety awareness;Pain;Impaired sensation;Impaired UE functional use      OT Treatment/Interventions: Self-care/ADL training;Therapeutic exercise;Patient/family education;Balance training;Neuromuscular education;Therapeutic activities;DME and/or AE instruction    OT Goals(Current goals can be found in the care plan section) Acute Rehab OT Goals Patient Stated Goal: Pt did not state but agreeable to participation in OT session. OT Goal Formulation: With patient Time For Goal Achievement: 02/26/23 Potential to Achieve Goals: Good  OT Frequency: Min 1X/week    Co-evaluation PT/OT/SLP Co-Evaluation/Treatment: Yes Reason for Co-Treatment: Complexity of the patient's impairments (multi-system involvement);To address functional/ADL transfers;For patient/therapist safety PT goals addressed during session: Mobility/safety with mobility;Balance;Proper use of DME;Strengthening/ROM OT goals addressed during session: ADL's and self-care      AM-PAC OT 6 Clicks Daily Activity     Outcome Measure Help from another person eating meals?: None Help from another person taking care of personal grooming?: A Little Help from another person toileting, which includes using toliet, bedpan, or urinal?: Total Help from another person bathing (including washing, rinsing, drying)?: Total Help from another person to put on and taking off regular upper body clothing?: A Lot Help from another person to put on and taking off regular lower body clothing?: Total 6 Click Score: 12   End of  Session Equipment Utilized During Treatment: Gait belt Nurse Communication: Mobility status  Activity Tolerance: Patient tolerated treatment well Patient left: in bed;with call bell/phone within reach  OT Visit Diagnosis: Unsteadiness on feet (R26.81);Other abnormalities of gait and mobility (R26.89);Muscle weakness (generalized) (M62.81);Hemiplegia and hemiparesis;Apraxia (R48.2);Other (comment) (vision deficits) Hemiplegia - Right/Left: Left Hemiplegia - dominant/non-dominant: Non-Dominant Hemiplegia - caused by: Cerebral infarction                Time: 8896-8852 OT Time Calculation (min): 44 min Charges:  OT General Charges $OT Visit: 1 Visit OT Evaluation $OT Eval Moderate Complexity: 1 Mod  Lynwood Constant, OTR/L Acute Rehabilitation Services  Office [phone] 02/12/2023

## 2023-02-12 NOTE — Progress Notes (Signed)
 [facility] Progress Note Patient: Kathleen Figueroa FMW:[id number] DOB: 01-20-1936 DOA: 02/11/2023  DOS: the patient was seen and examined on 02/12/2023  Brief Hospital Course: PMH of CVA, HTN, type II DM, PAD presented to hospital with numbness of left-sided weakness and vision deficit. Found to have acute stroke.  Neurology was consulted.  Acute ischemic infarct right PCA territory. Left homonymous hemianopsia. Right gaze preference.  Left-sided weakness. CT scan large acute/subacute right PCA infarct without any hemorrhage. CTA shows multiple occlusions. MRI shows petechial hemorrhage and edema without any mass effect. Echocardiogram shows preserved EF. LDL 193. Most likely noncompliant with medical regimen on discharge. Plavix  75 mg prior to admission.  Currently on aspirin  and Brilinta  for 90 days.  If medication is not covered by insurance aspirin  Brilinta  for 30 days. (DAPT recommended. PT OT recommending SNF.  PAD. Multiple sclerosis seen on the CT scan. Nonsignificant that requires acute intervention. Has prior history of carotid artery intervention. Monitor.  HTN. Blood pressure mildly elevated. Avoid hypotension.  HLD. LDL 193. On Lipitor  80 mg.  Type 2 diabetes mellitus, well-controlled without any insulin . Currently on sliding scale insulin .  Subjective: No acute complaint.  Tells me that she is actually fine.  Physical Exam: General: in Mild distress, No Rash Cardiovascular: S1 and S2 Present, No Murmur Respiratory: Good respiratory effort, Bilateral Air entry present. No Crackles, No wheezes Abdomen: Bowel Sound present, No tenderness Extremities: No edema Neuro: Alert and oriented x3, no new focal deficit, left-sided weakness, left neglect, right gaze preference.  Left homonymous hemianopsia.  Data Reviewed: I have Reviewed nursing notes, Vitals, and Lab results. Since last encounter, pertinent lab results CBC and BMP   . I have ordered test including  CBC and BMP  . I have discussed pt's care plan and test results with neurology  .   Disposition: Status is: Inpatient Remains inpatient appropriate because: Monitor for improvement in stroke workup  SCD's Start: 02/11/23 1734   Family Communication: No one at bedside Level of care: Telemetry Medical   Vitals:   02/12/23 1000 02/12/23 1157 02/12/23 1316 02/12/23 1511  BP: (!) 161/89 (!) 144/68 (!) 168/87 (!) 163/71  Pulse:  72 69 77  Resp: 14 18 18 18   Temp:  98.4 F (36.9 C) 99.1 F (37.3 C) 98.3 F (36.8 C)  TempSrc:  Oral Oral Oral  SpO2:  96%  100%  Weight:      Height:         Author: Yetta Blanch, MD 02/12/2023 6:50 PM  Please look on [email or web address] to find out who is on call.

## 2023-02-12 NOTE — ED Notes (Signed)
 pt family concerned about left arm for pain related to possible shoulder injury from her fall

## 2023-02-12 NOTE — Progress Notes (Addendum)
 STROKE TEAM PROGRESS NOTE   BRIEF HPI Ms. Kathleen Figueroa is a 88 y.o. female with history of TIAs, HTN, DM, Carotid Stenosis s/p left carotid endarterectomy presenting to ED 1/2 with left-sided weakness and vision deficit.  Patient states she fell right after getting up between 0500 and 0600, was then unable to get up due to left-sided weakness.  CTH showed concern for right PCA acute/subacute infarct. MRI shows large right PCA territory infarct.  LKW: unclear mRs: 3 IVT: No, d/t unclear LKW EVT: not offered as stroke appears completed   NIH on Admission: 3   INTERIM HISTORY/SUBJECTIVE  Patient seen in ED room. Sitting up in bed, eating breakfast with no issues.  She is alert, oriented, cognitive testing intact. Left visual field cut present.    OBJECTIVE  CBC    Component Value Date/Time   WBC 6.0 02/11/2023 1208   RBC 5.01 02/11/2023 1208   HGB 14.0 02/11/2023 1208   HCT 43.2 02/11/2023 1208   PLT 185 02/11/2023 1208   MCV 86.2 02/11/2023 1208   MCH 27.9 02/11/2023 1208   MCHC 32.4 02/11/2023 1208   RDW 14.0 02/11/2023 1208   LYMPHSABS 1.4 02/11/2023 1208   MONOABS 0.7 02/11/2023 1208   EOSABS 0.0 02/11/2023 1208   BASOSABS 0.0 02/11/2023 1208    BMET    Component Value Date/Time   NA 139 02/11/2023 1208   K 2.9 (L) 02/11/2023 1208   CL 104 02/11/2023 1208   CO2 23 02/11/2023 1208   GLUCOSE 133 (H) 02/11/2023 1208   BUN 19 02/11/2023 1208   CREATININE 1.13 (H) 02/11/2023 1208   CALCIUM  8.8 (L) 02/11/2023 1208   GFRNONAA 48 (L) 02/11/2023 1208    IMAGING past 24 hours MR BRAIN WO CONTRAST Result Date: 02/11/2023 CLINICAL DATA:  Stroke, follow up EXAM: MRI HEAD WITHOUT CONTRAST TECHNIQUE: Multiplanar, multiecho pulse sequences of the brain and surrounding structures were obtained without intravenous contrast. COMPARISON:  Same day CT head. FINDINGS: Brain: Large acute/confluent right PCA territory infarct which involves the right thalamus, right hippocampus and  right occipital lobe. Petechial hemorrhage. No significant mass effect or midline shift. Multiple foci of susceptibility artifact within the brainstem and bilateral thalami, compatible with chronic microhemorrhages that are likely due to chronic hypertension. No visible mass lesion. No hydrocephalus. Patchy T2/FLAIR hyperintensities in the white matter are compatible with chronic microvascular ischemic disease. Left parietal remote infarct. Vascular: Major arterial flow voids are maintained at the skull base. Skull and upper cervical spine: Normal marrow signal. Sinuses/Orbits: Clear sinuses.  No acute findings. Other: No mastoid effusions. IMPRESSION: Large acute/confluent right PCA territory infarct. Petechial hemorrhage and edema without significant mass effect. Findings discussed with Dr. Franky via telephone at 7:58 p.m. Electronically Signed   By: Gilmore GORMAN Molt M.D.   On: 02/11/2023 20:03   CT ANGIO HEAD NECK W WO CM Result Date: 02/11/2023 CLINICAL DATA:  Neuro deficit, acute, stroke suspected EXAM: CT ANGIOGRAPHY HEAD AND NECK WITH AND WITHOUT CONTRAST TECHNIQUE: Multidetector CT imaging of the head and neck was performed using the standard protocol during bolus administration of intravenous contrast. Multiplanar CT image reconstructions and MIPs were obtained to evaluate the vascular anatomy. Carotid stenosis measurements (when applicable) are obtained utilizing NASCET criteria, using the distal internal carotid diameter as the denominator. RADIATION DOSE REDUCTION: This exam was performed according to the departmental dose-optimization program which includes automated exposure control, adjustment of the mA and/or kV according to patient size and/or use of iterative reconstruction technique. CONTRAST:  65mL OMNIPAQUE  IOHEXOL  350 MG/ML SOLN COMPARISON:  Same day CT head. FINDINGS: CTA NECK FINDINGS Aortic arch: Aortic atherosclerosis. Great vessel origins are patent. Right carotid system:  Atherosclerosis at the carotid bifurcation without greater than 50% stenosis. Tortuous ICA. Left carotid system: No evidence of dissection, stenosis (50% or greater), or occlusion. Vertebral arteries: Patent bilaterally. Diminished opacification of the right vertebral artery compared to the left, likely due to intradural occlusion described below. Severe left vertebral artery origin stenosis. Skeleton: No acute abnormality on limited assessment. Multilevel degenerative change. Other neck: No acute abnormality on limited assessment. Upper chest: Visi mo. 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 Otherwise, bilateral intracranial ICAs, MCAs, and ACAs are patent without proximal hemodynamically significant stenosis. Posterior circulation: Occlusion of the intradural right vertebral artery with distal reconstitution. Left intradural vertebral artery is patent. Basilar artery is patent. Severe stenosis of the left P2 PCA. Occlusion of the fetal type right PCA proximally. Venous sinuses: Not well assessed due to arterial timing. Anatomic variants: Detailed above. Review of the MIP images confirms the above findings Provider paged at the time of dictation for call of report. IMPRESSION: 1. Occluded proximal right fetal type PCA. 2. Occluded intradural right vertebral artery. 3. Severe stenosis of the left P2 PCA. 4. Severe left vertebral artery origin stenosis. 5. Aortic Atherosclerosis (ICD10-I70.0) and Emphysema (ICD10-J43.9). Findings discussed with Dr. Franky via telephone at 7:58 p.m. Electronically Signed   By: Gilmore GORMAN Molt M.D.   On: 02/11/2023 19:59   CT CERVICAL SPINE WO CONTRAST Result Date: 02/11/2023 CLINICAL DATA:  Polytrauma, blunt; Mental status change, unknown cause. Fall. EXAM: CT HEAD WITHOUT CONTRAST CT CERVICAL SPINE WITHOUT CONTRAST TECHNIQUE: Multidetector CT imaging of the head and  cervical spine was performed following the standard protocol without intravenous contrast. Multiplanar CT image reconstructions of the cervical spine were also generated. RADIATION DOSE REDUCTION: This exam was performed according to the departmental dose-optimization program which includes automated exposure control, adjustment of the mA and/or kV according to patient size and/or use of iterative reconstruction technique. COMPARISON:  CTA head and neck 11/13/2022.  MRI head 11/12/2022. FINDINGS: CT HEAD FINDINGS Brain: There is a large acute or subacute right PCA infarct with cytotoxic edema resulting in mild mass effect on the right lateral ventricle. No intracranial hemorrhage or midline shift is evident. There is no extra-axial fluid collection or hydrocephalus. A moderate-sized chronic left parietal infarct and small chronic infarcts in the deep gray nuclei bilaterally, pons, and genu of the corpus callosum are again noted. There is a background of extensive chronic small vessel ischemia in the cerebral white matter. There is mild ex vacuo dilatation of the left lateral ventricle. Vascular: Calcified atherosclerosis at the skull base. Skull: No acute fracture or suspicious osseous lesion. Sinuses/Orbits: No significant inflammatory changes in the included portion of the paranasal sinuses or mastoid air cells. Bilateral cataract extraction. Right scleral buckle. Other: None. CT CERVICAL SPINE FINDINGS Alignment: Straightening of the normal cervical lordosis. Unchanged grade 1 anterolisthesis of C2 on C3 and C3 on C4 and grade 1 retrolisthesis of C5 on C6. 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 No acute fracture or suspicious osseous lesion. Soft tissues and spinal canal: No prevertebral fluid or swelling. No visible canal hematoma. Disc levels: Advanced disc degeneration at C5-6 and C7-T1. Advanced multilevel facet arthrosis. Moderate multilevel neural foraminal stenosis. Possible moderate spinal  stenosis at C5-6. Upper chest: Emphysema and chronic  centrilobular micronodularity. Other: Atherosclerotic calcification at the right carotid bifurcation. Left carotid endarterectomy. IMPRESSION: 1. Large acute/subacute right PCA infarct. No hemorrhage. 2. No acute cervical spine fracture. 3.  Emphysema (ICD10-J43.9). Electronically Signed   By: Dasie Hamburg M.D.   On: 02/11/2023 16:33   CT HEAD WO CONTRAST ( ) Result Date: 02/11/2023 CLINICAL DATA:  Polytrauma, blunt; Mental status change, unknown cause. Fall. EXAM: CT HEAD WITHOUT CONTRAST CT CERVICAL SPINE WITHOUT CONTRAST TECHNIQUE: Multidetector CT imaging of the head and cervical spine was performed following the standard protocol without intravenous contrast. Multiplanar CT image reconstructions of the cervical spine were also generated. RADIATION DOSE REDUCTION: This exam was performed according to the departmental dose-optimization program which includes automated exposure control, adjustment of the mA and/or kV according to patient size and/or use of iterative reconstruction technique. COMPARISON:  CTA head and neck 11/13/2022.  MRI head 11/12/2022. FINDINGS: CT HEAD FINDINGS Brain: There is a large acute or subacute right PCA infarct with cytotoxic edema resulting in mild mass effect on the right lateral ventricle. No intracranial hemorrhage or midline shift is evident. There is no extra-axial fluid collection or hydrocephalus. A moderate-sized chronic left parietal infarct and small chronic infarcts in the deep gray nuclei bilaterally, pons, and genu of the corpus callosum are again noted. There is a background of extensive chronic small vessel ischemia in the cerebral white matter. There is mild ex vacuo dilatation of the left lateral ventricle. Vascular: Calcified atherosclerosis at the skull base. Skull: No acute fracture or suspicious osseous lesion. Sinuses/Orbits: No significant inflammatory changes in the included portion of the paranasal  sinuses or mastoid air cells. Bilateral cataract extraction. Right scleral buckle. Other: None. CT CERVICAL SPINE FINDINGS Alignment: Straightening of the normal cervical lordosis. Unchanged grade 1 anterolisthesis of C2 on C3 and C3 on C4 and grade 1 retrolisthesis of C5 on C6. 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 No acute fracture or suspicious osseous lesion. Soft tissues and spinal canal: No prevertebral fluid or swelling. No visible canal hematoma. Disc levels: Advanced disc degeneration at C5-6 and C7-T1. Advanced multilevel facet arthrosis. Moderate multilevel neural foraminal stenosis. Possible moderate spinal stenosis at C5-6. Upper chest: Emphysema and chronic centrilobular micronodularity. Other: Atherosclerotic calcification at the right carotid bifurcation. Left carotid endarterectomy. IMPRESSION: 1. Large acute/subacute right PCA infarct. No hemorrhage. 2. No acute cervical spine fracture. 3.  Emphysema (ICD10-J43.9). Electronically Signed   By: Dasie Hamburg M.D.   On: 02/11/2023 16:33    Vitals:   02/12/23 0300 02/12/23 0358 02/12/23 0545 02/12/23 0801  BP: (!) 172/70  138/75   Pulse: 78  69   Resp: 14  (!) 21   Temp:  98.7 F (37.1 C)  98.4 F (36.9 C)  TempSrc:  Oral  Oral  SpO2: 97%  96%   Weight:      Height:         PHYSICAL EXAM General:  Alert, well-nourished, well-developed patient in no acute distress Psych:  Mood and affect appropriate for situation CV: Regular rate and rhythm on monitor Respiratory:  Regular, unlabored respirations on room air GI: Abdomen soft and nontender   NEURO:  Mental Status: AA&Ox3, patient is able to give clear and coherent history No aphasia or dysarthria. Able to add change, named 9 animals who can walk on 4 legs.  And recall short-term memory words diminished 2/3.SABRA  Speech/Language: speech is without dysarthria or aphasia.  Naming, repetition, fluency, and comprehension intact.  Cranial Nerves:  II: PERRL. Left  field  cut III, IV, VI: EOMI. Eyelids elevate symmetrically.  V: Sensation is intact to light touch and symmetrical to face.  VII: Face is symmetrical resting and smiling VIII: hearing intact to voice. IX, X: Palate elevates symmetrically. Phonation is normal.  KP:Dynloizm shrug 5/5. XII: tongue is midline without fasciculations. Motor: 5/5 strength to all muscle groups tested. Very slight drift tn LU.E  Tone: is normal and bulk is normal Sensation- Intact to light touch bilaterally. Extinction absent to light touch to DSS.   Coordination: LUE ataxia Gait- deferred  Most Recent NIH: 2   ASSESSMENT/PLAN  Acute Ischemic Infarct:  right PCA territory Etiology:  large vessel disease  Code Stroke CT head  Large acute/subacute right PCA infarct No hemorrhage  CTA head & neck  Occluded proximal right fetal PCA Occluded untradural right vertebral artery Severe left P2 stenosis Severe left vertebral artery origin stenosis  MRI   Large acute/confluent right PCA territory infarct. Petechial hemorrhage and edema without significant mass effect  2D Echo  LVEF 60-65% Grade I diastolic dysfunction LDL 193 HgbA1c 6.5 VTE prophylaxis - hep SQ clopidogrel  75 mg daily prior to admission, now on Aspirin  and Brilinta  90mg  BID for 90days preferred. If medications is not covered by insurance. Brilinta  and ASA for 30 days. After, continue on DAPT therapy, Aspirin  and Plavix .  Therapy recommendations:  SNF Disposition:  pending  Hx of Stroke/TIA 04/2018 Presented to [facility] with intermittent slurred speech and left-hand numbness MRI: no acute stroke Notes state she was not on OAC prior, started on aspirin  81mg . 01/2014  Presented with right hand and arm clumsiness, weakness, slurred speech that resolved qickly.  MRI Multiple small foci of acute left cerebral hemisphere, possibly watershed infarcts Chronic infarcts and moderate small vessel ischemia Carotid Doppler: 80-99% stenosis Left ICA Placed  on DAPT  Hypertension Home meds:  norvasc  10mg  Stable BP goal normotensive. Avoid hypotension.   Hyperlipidemia Home meds:  none LDL 193, goal < 70 Add Lipitor  80mg   Continue statin at discharge  Diabetes type II Controlled Home meds:  none HgbA1c 6.5, goal < 7.0 CBGs SSI Recommend close follow-up with PCP for better DM control  Tobacco Abuse Former cigarette smoker  Other Stroke Risk Factors Coronary artery disease  Hospital day # 0   Pt seen by Neuro NP/APP and later by MD. Note/plan to be edited by MD as needed.    Rocky JAYSON Likes, DNP, AGACNP-BC [facility] Please use AMION for contact information & EPIC for messaging.  I have personally obtained history,examined this patient, reviewed notes, independently viewed imaging studies, participated in medical decision making and plan of care.ROS completed by me personally and pertinent positives fully documented  I have made any additions or clarifications directly to the above note. Agree with note above.  Patient presented with left-sided vision loss on the fall but is a poor historian MRI clearly shows right PCA infarct with CT angiogram showing occluded right vertebral and right posterior cerebral artery.  Stroke etiology likely large vessel disease.  Recommend dual antiplatelet therapy of aspirin  and Brilinta  for 3 months if she can afford it otherwise aspirin  and Plavix  and statin followed by aspirin  alone and aggressive risk factor modification.  Statin for elevated lipids.  Physical Occupational Therapy consults.  Patient counseled not to drive till peripheral vision loss improves.  Long discussion with patient and answered questions.  Discussed with Dr. Tobie.  Greater than 50% time during this 50-minute visit were spent in counseling and coordination of  care about her stroke and intracranial stenosis and peripheral vision loss answered questions.  Follow-up as an outpatient stroke clinic in 2 months.  Stroke team  will sign off.  Kindly call for questions  Eather Popp, MD Medical Director [facility] Pager: [phone] 02/12/2023 5:01 PM  To contact Stroke Continuity provider, please refer to [email or web address]. After hours, contact General Neurology

## 2023-02-12 NOTE — Progress Notes (Signed)
 Received patient from ED by transporter. Alert and Oriented.

## 2023-02-12 NOTE — Plan of Care (Signed)
  Problem: Education: Goal: Knowledge of patient specific risk factors will improve Kathleen Figueroa N/A or DELETE if not current risk factor) Outcome: Progressing   Problem: Ischemic Stroke/TIA Tissue Perfusion: Goal: Complications of ischemic stroke/TIA will be minimized Outcome: Progressing   Problem: Self-Care: Goal: Ability to participate in self-care as condition permits will improve Outcome: Progressing   Problem: Activity: Goal: Risk for activity intolerance will decrease Outcome: Progressing   Problem: Safety: Goal: Ability to remain free from injury will improve Outcome: Progressing

## 2023-02-12 NOTE — ED Notes (Signed)
 ED TO INPATIENT HANDOFF REPORT  ED Nurse Name and Phone #: [phone]  S Name/Age/Gender Kathleen Figueroa 88 y.o. female Room/Bed: 039C/039C  Code Status   Code Status: Full Code  Home/SNF/Other Home Patient oriented to: self, place, and time Is this baseline? Yes   Triage Complete: Triage complete  Chief Complaint Acute CVA (cerebrovascular accident) [facility]) [I63.9]  Triage Note Pt to ED via [facility] c/o found down by daughter, unknown down time.  Last known well 7pm last night. Pt has hx of stroke with weakness deficit. Presents today with left side deficit.  Pt been off scheduled medications x 3 days.  Last VS: CBG150, hr100, 18rr. 220/110 , 99%RA  #18LAC No medications given by EMS.   A&Ox 4   Allergies Allergies  Allergen Reactions   Aspirin  Nausea Only    Higher dose    Level of Care/Admitting Diagnosis ED Disposition     ED Disposition  Admit   Condition  --   Comment  Hospital Area: [facility] [[id number]]  Level of Care: Telemetry Medical [104]  May place patient in observation at [facility] or [facility] if equivalent level of care is available:: No  Covid Evaluation: Asymptomatic - no recent exposure (last 10 days) testing not required  Diagnosis: Acute CVA (cerebrovascular accident) [facility]) [[id number]]  Admitting Physician: MELVIN, ALEXANDER B [[id number]]  Attending Physician: MELVIN, ALEXANDER B [[id number]]          B Medical/Surgery History 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)    CVA (cerebral vascular accident) (HCC) 11/12/2022   Diabetes mellitus type 2, uncontrolled, 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   H/O detached retina repair     Hypertension    TIA (transient ischemic attack)    Past Surgical History:  Procedure Laterality Date   ENDARTERECTOMY Left 01/19/2014   Procedure: ENDARTERECTOMY CAROTID-LEFT;  Surgeon: Gaile LELON New, MD;  Location: [facility] OR;  Service: Vascular;  Laterality: Left;   EYE SURGERY     lens removed from right eye   PATCH ANGIOPLASTY Left 01/19/2014   Procedure: PATCH ANGIOPLASTY, LEFT CAROTID ARTERY USING HEMASHIELD PLATINUM FINESSE PATCH;  Surgeon: Gaile LELON New, MD;  Location: [facility] OR;  Service: Vascular;  Laterality: Left;   TONSILLECTOMY       A IV Location/Drains/Wounds Patient Lines/Drains/Airways Status     Active Line/Drains/Airways     Name Placement date Placement time Site Days   Peripheral IV 02/11/23 18 G Left Antecubital 02/11/23  1212  Antecubital  1            Intake/Output Last 24 hours  Intake/Output Summary (Last 24 hours) at 02/12/2023 1131 Last data filed at 02/12/2023 0000 Gross per 24 hour  Intake 484 ml  Output --  Net 484 ml    Labs/Imaging Results for orders placed or performed during the hospital encounter of 02/11/23 (from the past 48 hours)  Urine rapid drug screen (hosp performed)not at [facility]     Status: None   Collection Time: 02/11/23 11:54 AM  Result Value Ref Range   Opiates NONE DETECTED NONE DETECTED   Cocaine NONE DETECTED NONE DETECTED   Benzodiazepines NONE DETECTED NONE DETECTED   Amphetamines NONE  DETECTED NONE DETECTED   Tetrahydrocannabinol NONE DETECTED NONE DETECTED   Barbiturates NONE DETECTED NONE DETECTED    Comment: (NOTE) DRUG SCREEN FOR MEDICAL PURPOSES ONLY.  IF CONFIRMATION IS NEEDED FOR ANY PURPOSE, NOTIFY LAB WITHIN 5 DAYS.  LOWEST DETECTABLE LIMITS FOR URINE DRUG SCREEN Drug Class                     Cutoff (ng/mL) Amphetamine and metabolites    1000 Barbiturate and metabolites    200 Benzodiazepine                 200 Opiates and metabolites        300 Cocaine and metabolites        300 THC                             50 Performed at [facility], [address], [place], [place] [address]   Urinalysis, Routine w reflex microscopic -Urine, Clean Catch     Status: Abnormal   Collection Time: 02/11/23 11:54 AM  Result Value Ref Range   Color, Urine STRAW (A) YELLOW   APPearance CLEAR CLEAR   Specific Gravity, Urine 1.006 1.005 - 1.030   pH 8.0 5.0 - 8.0   Glucose, UA NEGATIVE NEGATIVE mg/dL   Hgb urine dipstick NEGATIVE NEGATIVE   Bilirubin Urine NEGATIVE NEGATIVE   Ketones, ur NEGATIVE NEGATIVE mg/dL   Protein, ur NEGATIVE NEGATIVE mg/dL   Nitrite NEGATIVE NEGATIVE   Leukocytes,Ua NEGATIVE NEGATIVE    Comment: Performed at [facility], [address], [place], [place] [address]  Protime-INR     Status: None   Collection Time: 02/11/23 12:08 PM  Result Value Ref Range   Prothrombin Time 13.5 11.4 - 15.2 seconds   INR 1.0 0.8 - 1.2    Comment: (NOTE) INR goal varies based on device and disease states. Performed at [facility], [address] [place] [place], [place] [address]   APTT     Status: None   Collection Time: 02/11/23 12:08 PM  Result Value Ref Range   aPTT 31 24 - 36 seconds    Comment: Performed at [facility], [address], [place], [place] [address]  CBC     Status: None   Collection Time: 02/11/23 12:08 PM  Result Value Ref Range   WBC 6.0 4.0 - 10.5 K/uL   RBC 5.01 3.87 - 5.11 MIL/uL   Hemoglobin 14.0 12.0 - 15.0 g/dL   HCT 56.7 63.9 - 53.9 %   MCV 86.2 80.0 - 100.0 fL   MCH 27.9 26.0 - 34.0 pg   MCHC 32.4 30.0 - 36.0 g/dL   RDW 85.9 88.4 - 84.4 %   Platelets 185 150 - 400 K/uL   nRBC 0.0 0.0 - 0.2 %    Comment: Performed at [facility], [address] [place], [place] [address]  Differential     Status: None   Collection Time: 02/11/23 12:08 PM  Result Value Ref Range   Neutrophils Relative % 65 %   Neutro Abs 3.9 1.7 - 7.7 K/uL   Lymphocytes Relative 23 %   Lymphs Abs 1.4 0.7 - 4.0 K/uL   Monocytes Relative 11 %    Monocytes Absolute 0.7 0.1 - 1.0 K/uL   Eosinophils Relative 0 %   Eosinophils Absolute 0.0 0.0 - 0.5 K/uL   Basophils Relative 1 %  Basophils Absolute 0.0 0.0 - 0.1 K/uL   Immature Granulocytes 0 %   Abs Immature Granulocytes 0.02 0.00 - 0.07 K/uL    Comment: Performed at [facility], [address], [place], [place] [address]  Comprehensive metabolic panel     Status: Abnormal   Collection Time: 02/11/23 12:08 PM  Result Value Ref Range   Sodium 139 135 - 145 mmol/L   Potassium 2.9 (L) 3.5 - 5.1 mmol/L   Chloride 104 98 - 111 mmol/L   CO2 23 22 - 32 mmol/L   Glucose, Bld 133 (H) 70 - 99 mg/dL    Comment: Glucose reference range applies only to samples taken after fasting for at least 8 hours.   BUN 19 8 - 23 mg/dL   Creatinine, Ser 8.86 (H) 0.44 - 1.00 mg/dL   Calcium  8.8 (L) 8.9 - 10.3 mg/dL   Total Protein 6.6 6.5 - 8.1 g/dL   Albumin 3.5 3.5 - 5.0 g/dL   AST 26 15 - 41 U/L   ALT 11 0 - 44 U/L   Alkaline Phosphatase 60 38 - 126 U/L   Total Bilirubin 0.8 0.0 - 1.2 mg/dL   GFR, Estimated 48 (L) >60 mL/min    Comment: (NOTE) Calculated using the CKD-EPI Creatinine Equation (2021)    Anion gap 12 5 - 15    Comment: Performed at [facility], [address], [place], [place] [address]  Ethanol     Status: None   Collection Time: 02/11/23 12:08 PM  Result Value Ref Range   Alcohol , Ethyl (B) <10 <10 mg/dL    Comment: (NOTE) Lowest detectable limit for serum alcohol  is 10 mg/dL.  For medical purposes only. Performed at [facility], [address] [place], [place] [address]   Magnesium      Status: None   Collection Time: 02/11/23 12:08 PM  Result Value Ref Range   Magnesium  1.7 1.7 - 2.4 mg/dL    Comment: Performed at [facility] Lab, [address], [place], [place] [address]  CBG monitoring, ED     Status: Abnormal   Collection Time: 02/11/23 12:09 PM  Result Value Ref Range   Glucose-Capillary 139 (H) 70 - 99 mg/dL    Comment: Glucose  reference range applies only to samples taken after fasting for at least 8 hours.  Lipid panel     Status: Abnormal   Collection Time: 02/12/23  5:11 AM  Result Value Ref Range   Cholesterol 276 (H) 0 - 200 mg/dL   Triglycerides 86 <849 mg/dL   HDL 66 >59 mg/dL   Total CHOL/HDL Ratio 4.2 RATIO   VLDL 17 0 - 40 mg/dL   LDL Cholesterol 806 (H) 0 - 99 mg/dL    Comment:        Total Cholesterol/HDL:CHD Risk Coronary Heart Disease Risk Table                     Men   Women  1/2 Average Risk   3.4   3.3  Average Risk       5.0   4.4  2 X Average Risk   9.6   7.1  3 X Average Risk  23.4   11.0        Use the calculated Patient Ratio above and the CHD Risk Table to determine the patient's CHD Risk.        ATP III CLASSIFICATION (LDL):  <100  mg/dL   Optimal  899-870  mg/dL   Near or Above                    Optimal  130-159  mg/dL   Borderline  839-810  mg/dL   High  >809     mg/dL   Very High Performed at [facility], [address] [place], [place] [address]   Basic metabolic panel     Status: Abnormal   Collection Time: 02/12/23 10:41 AM  Result Value Ref Range   Sodium 138 135 - 145 mmol/L   Potassium 3.2 (L) 3.5 - 5.1 mmol/L   Chloride 97 (L) 98 - 111 mmol/L   CO2 25 22 - 32 mmol/L   Glucose, Bld 145 (H) 70 - 99 mg/dL    Comment: Glucose reference range applies only to samples taken after fasting for at least 8 hours.   BUN 14 8 - 23 mg/dL   Creatinine, Ser 8.77 (H) 0.44 - 1.00 mg/dL   Calcium  9.2 8.9 - 10.3 mg/dL   GFR, Estimated 43 (L) >60 mL/min    Comment: (NOTE) Calculated using the CKD-EPI Creatinine Equation (2021)    Anion gap 16 (H) 5 - 15    Comment: Performed at [facility], [address], [place], [place] [address]  Magnesium      Status: None   Collection Time: 02/12/23 10:41 AM  Result Value Ref Range   Magnesium  1.7 1.7 - 2.4 mg/dL    Comment: Performed at [facility], [address], [place], [place] [address]  CBC with  Differential/Platelet     Status: Abnormal   Collection Time: 02/12/23 10:41 AM  Result Value Ref Range   WBC 5.3 4.0 - 10.5 K/uL   RBC 5.39 (H) 3.87 - 5.11 MIL/uL   Hemoglobin 15.2 (H) 12.0 - 15.0 g/dL   HCT 53.4 (H) 63.9 - 53.9 %   MCV 86.3 80.0 - 100.0 fL   MCH 28.2 26.0 - 34.0 pg   MCHC 32.7 30.0 - 36.0 g/dL   RDW 85.7 88.4 - 84.4 %   Platelets 191 150 - 400 K/uL   nRBC 0.0 0.0 - 0.2 %   Neutrophils Relative % 62 %   Neutro Abs 3.3 1.7 - 7.7 K/uL   Lymphocytes Relative 22 %   Lymphs Abs 1.2 0.7 - 4.0 K/uL   Monocytes Relative 14 %   Monocytes Absolute 0.7 0.1 - 1.0 K/uL   Eosinophils Relative 1 %   Eosinophils Absolute 0.1 0.0 - 0.5 K/uL   Basophils Relative 1 %   Basophils Absolute 0.0 0.0 - 0.1 K/uL   Immature Granulocytes 0 %   Abs Immature Granulocytes 0.02 0.00 - 0.07 K/uL    Comment: Performed at [facility], [address], [place], [place] [address]   DG Shoulder Left Result Date: 02/12/2023 CLINICAL DATA:  Status post fall with left shoulder pain EXAM: LEFT SHOULDER - 3 VIEW COMPARISON:  None Available. FINDINGS: There is no evidence of fracture or dislocation. Degenerative changes of the left glenohumeral joint. Surgical clips project over the left submandibular region. Small focus of ill-defined radiodensity projecting over the lateral left upper lung. IMPRESSION: 1. No acute fracture or dislocation. 2. Small focus of ill-defined radiodensity projecting over the lateral left upper lung, which may represent a pulmonary nodule. Recommend further evaluation with dedicated chest radiograph. Electronically Signed   By: Limin  Xu M.D.   On: 02/12/2023  09:41   MR BRAIN WO CONTRAST Result Date: 02/11/2023 CLINICAL DATA:  Stroke, follow up EXAM: MRI HEAD WITHOUT CONTRAST TECHNIQUE: Multiplanar, multiecho pulse sequences of the brain and surrounding structures were obtained without intravenous contrast. COMPARISON:  Same day CT head. FINDINGS: Brain: Large acute/confluent  right PCA territory infarct which involves the right thalamus, right hippocampus and right occipital lobe. Petechial hemorrhage. No significant mass effect or midline shift. Multiple foci of susceptibility artifact within the brainstem and bilateral thalami, compatible with chronic microhemorrhages that are likely due to chronic hypertension. No visible mass lesion. No hydrocephalus. Patchy T2/FLAIR hyperintensities in the white matter are compatible with chronic microvascular ischemic disease. Left parietal remote infarct. Vascular: Major arterial flow voids are maintained at the skull base. Skull and upper cervical spine: Normal marrow signal. Sinuses/Orbits: Clear sinuses.  No acute findings. Other: No mastoid effusions. IMPRESSION: Large acute/confluent right PCA territory infarct. Petechial hemorrhage and edema without significant mass effect. Findings discussed with Dr. Franky via telephone at 7:58 p.m. Electronically Signed   By: Gilmore GORMAN Molt M.D.   On: 02/11/2023 20:03   CT ANGIO HEAD NECK W WO CM Result Date: 02/11/2023 CLINICAL DATA:  Neuro deficit, acute, stroke suspected EXAM: CT ANGIOGRAPHY HEAD AND NECK WITH AND WITHOUT CONTRAST TECHNIQUE: Multidetector CT imaging of the head and neck was performed using the standard protocol during bolus administration of intravenous contrast. Multiplanar CT image reconstructions and MIPs were obtained to evaluate the vascular anatomy. Carotid stenosis measurements (when applicable) are obtained utilizing NASCET criteria, using the distal internal carotid diameter as the denominator. RADIATION DOSE REDUCTION: This exam was performed according to the departmental dose-optimization program which includes automated exposure control, adjustment of the mA and/or kV according to patient size and/or use of iterative reconstruction technique. CONTRAST:  65mL OMNIPAQUE  IOHEXOL  350 MG/ML SOLN COMPARISON:  Same day CT head. FINDINGS: CTA NECK FINDINGS Aortic arch:  Aortic atherosclerosis. Great vessel origins are patent. Right carotid system: Atherosclerosis at the carotid bifurcation without greater than 50% stenosis. Tortuous ICA. Left carotid system: No evidence of dissection, stenosis (50% or greater), or occlusion. Vertebral arteries: Patent bilaterally. Diminished opacification of the right vertebral artery compared to the left, likely due to intradural occlusion described below. Severe left vertebral artery origin stenosis. Skeleton: No acute abnormality on limited assessment. Multilevel degenerative change. Other neck: No acute abnormality on limited assessment. Upper chest: Visi mo. 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 Otherwise, bilateral intracranial ICAs, MCAs, and ACAs are patent without proximal hemodynamically significant stenosis. Posterior circulation: Occlusion of the intradural right vertebral artery with distal reconstitution. Left intradural vertebral artery is patent. Basilar artery is patent. Severe stenosis of the left P2 PCA. Occlusion of the fetal type right PCA proximally. Venous sinuses: Not well assessed due to arterial timing. Anatomic variants: Detailed above. Review of the MIP images confirms the above findings Provider paged at the time of dictation for call of report. IMPRESSION: 1. Occluded proximal right fetal type PCA. 2. Occluded intradural right vertebral artery. 3. Severe stenosis of the left P2 PCA. 4. Severe left vertebral artery origin stenosis. 5. Aortic Atherosclerosis (ICD10-I70.0) and Emphysema (ICD10-J43.9). Findings discussed with Dr. Franky via telephone at 7:58 p.m. Electronically Signed   By: Gilmore GORMAN Molt M.D.   On: 02/11/2023 19:59   CT CERVICAL SPINE WO CONTRAST Result Date: 02/11/2023 CLINICAL DATA:  Polytrauma, blunt; Mental status change, unknown cause. Fall. EXAM: CT HEAD WITHOUT CONTRAST  CT CERVICAL  SPINE WITHOUT CONTRAST TECHNIQUE: Multidetector CT imaging of the head and cervical spine was performed following the standard protocol without intravenous contrast. Multiplanar CT image reconstructions of the cervical spine were also generated. RADIATION DOSE REDUCTION: This exam was performed according to the departmental dose-optimization program which includes automated exposure control, adjustment of the mA and/or kV according to patient size and/or use of iterative reconstruction technique. COMPARISON:  CTA head and neck 11/13/2022.  MRI head 11/12/2022. FINDINGS: CT HEAD FINDINGS Brain: There is a large acute or subacute right PCA infarct with cytotoxic edema resulting in mild mass effect on the right lateral ventricle. No intracranial hemorrhage or midline shift is evident. There is no extra-axial fluid collection or hydrocephalus. A moderate-sized chronic left parietal infarct and small chronic infarcts in the deep gray nuclei bilaterally, pons, and genu of the corpus callosum are again noted. There is a background of extensive chronic small vessel ischemia in the cerebral white matter. There is mild ex vacuo dilatation of the left lateral ventricle. Vascular: Calcified atherosclerosis at the skull base. Skull: No acute fracture or suspicious osseous lesion. Sinuses/Orbits: No significant inflammatory changes in the included portion of the paranasal sinuses or mastoid air cells. Bilateral cataract extraction. Right scleral buckle. Other: None. CT CERVICAL SPINE FINDINGS Alignment: Straightening of the normal cervical lordosis. Unchanged grade 1 anterolisthesis of C2 on C3 and C3 on C4 and grade 1 retrolisthesis of C5 on C6. 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 No acute fracture or suspicious osseous lesion. Soft tissues and spinal canal: No prevertebral fluid or swelling. No visible canal hematoma. Disc levels: Advanced disc degeneration at C5-6 and C7-T1. Advanced multilevel facet arthrosis.  Moderate multilevel neural foraminal stenosis. Possible moderate spinal stenosis at C5-6. Upper chest: Emphysema and chronic centrilobular micronodularity. Other: Atherosclerotic calcification at the right carotid bifurcation. Left carotid endarterectomy. IMPRESSION: 1. Large acute/subacute right PCA infarct. No hemorrhage. 2. No acute cervical spine fracture. 3.  Emphysema (ICD10-J43.9). Electronically Signed   By: Dasie Hamburg M.D.   On: 02/11/2023 16:33   CT HEAD WO CONTRAST ( ) Result Date: 02/11/2023 CLINICAL DATA:  Polytrauma, blunt; Mental status change, unknown cause. Fall. EXAM: CT HEAD WITHOUT CONTRAST CT CERVICAL SPINE WITHOUT CONTRAST TECHNIQUE: Multidetector CT imaging of the head and cervical spine was performed following the standard protocol without intravenous contrast. Multiplanar CT image reconstructions of the cervical spine were also generated. RADIATION DOSE REDUCTION: This exam was performed according to the departmental dose-optimization program which includes automated exposure control, adjustment of the mA and/or kV according to patient size and/or use of iterative reconstruction technique. COMPARISON:  CTA head and neck 11/13/2022.  MRI head 11/12/2022. FINDINGS: CT HEAD FINDINGS Brain: There is a large acute or subacute right PCA infarct with cytotoxic edema resulting in mild mass effect on the right lateral ventricle. No intracranial hemorrhage or midline shift is evident. There is no extra-axial fluid collection or hydrocephalus. A moderate-sized chronic left parietal infarct and small chronic infarcts in the deep gray nuclei bilaterally, pons, and genu of the corpus callosum are again noted. There is a background of extensive chronic small vessel ischemia in the cerebral white matter. There is mild ex vacuo dilatation of the left lateral ventricle. Vascular: Calcified atherosclerosis at the skull base. Skull: No acute fracture or suspicious osseous lesion. Sinuses/Orbits: No  significant inflammatory changes in the included portion of the paranasal sinuses or mastoid air cells. Bilateral cataract extraction. Right scleral buckle. Other: None. CT CERVICAL SPINE FINDINGS Alignment:  Straightening of the normal cervical lordosis. Unchanged grade 1 anterolisthesis of C2 on C3 and C3 on C4 and grade 1 retrolisthesis of C5 on C6. 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 No acute fracture or suspicious osseous lesion. Soft tissues and spinal canal: No prevertebral fluid or swelling. No visible canal hematoma. Disc levels: Advanced disc degeneration at C5-6 and C7-T1. Advanced multilevel facet arthrosis. Moderate multilevel neural foraminal stenosis. Possible moderate spinal stenosis at C5-6. Upper chest: Emphysema and chronic centrilobular micronodularity. Other: Atherosclerotic calcification at the right carotid bifurcation. Left carotid endarterectomy. IMPRESSION: 1. Large acute/subacute right PCA infarct. No hemorrhage. 2. No acute cervical spine fracture. 3.  Emphysema (ICD10-J43.9). Electronically Signed   By: Dasie Hamburg M.D.   On: 02/11/2023 16:33    Pending Labs Unresulted Labs (From admission, onward)     Start     Ordered   02/12/23 0500  Hemoglobin A1c  (Labs)  Tomorrow morning,   R       Comments: To assess prior glycemic control    02/11/23 1743            Vitals/Pain Today's Vitals   02/12/23 0934 02/12/23 0957 02/12/23 0957 02/12/23 1000  BP:    (!) 161/89  Pulse:      Resp:    14  Temp:      TempSrc:      SpO2:      Weight:      Height:      PainSc: 0-No pain 0-No pain 0-No pain     Isolation Precautions No active isolations  Medications Medications  clopidogrel  (PLAVIX ) tablet 75 mg (75 mg Oral Given 02/12/23 1000)  0.9 %  sodium chloride  infusion (0 mLs Intravenous Stopped 02/12/23 0000)  acetaminophen  (TYLENOL ) tablet 650 mg (has no administration in time range)    Or  acetaminophen  (TYLENOL ) 160 MG/5ML solution 650 mg (has no  administration in time range)    Or  acetaminophen  (TYLENOL ) suppository 650 mg (has no administration in time range)  senna-docusate (Senokot-S) tablet 1 tablet (has no administration in time range)  potassium chloride  (KLOR-CON ) packet 40 mEq (0 mEq Oral Hold 02/11/23 1900)  aspirin  EC tablet 81 mg (81 mg Oral Given 02/12/23 1000)  0.9 % NaCl with KCl 20 mEq/ L  infusion (0 mL/hr Intravenous Stopped 02/11/23 1730)   stroke: early stages of recovery book ( Does not apply Given 02/12/23 1000)  iohexol  (OMNIPAQUE ) 350 MG/ML injection 65 mL (65 mLs Intravenous Contrast Given 02/11/23 1942)    Mobility walks with person assist     Focused Assessments Neuro Assessment Handoff:  Swallow screen pass? Yes  Cardiac Rhythm: Normal sinus rhythm NIH Stroke Scale  Dizziness Present: No Headache Present: No Interval: Shift assessment Level of Consciousness (1a.)   : Alert, keenly responsive LOC Questions (1b. )   : Answers both questions correctly LOC Commands (1c. )   : Performs both tasks correctly Best Gaze (2. )  : Normal Visual (3. )  : Partial hemianopia Facial Palsy (4. )    : Normal symmetrical movements Motor Arm, Left (5a. )   : Drift Motor Arm, Right (5b. ) : No drift Motor Leg, Left (6a. )  : No drift Motor Leg, Right (6b. ) : No drift Limb Ataxia (7. ): Absent Sensory (8. )  : Normal, no sensory loss Best Language (9. )  : No aphasia Dysarthria (10. ): Normal Extinction/Inattention (11.)   : No Abnormality Complete NIHSS TOTAL: 2  Neuro Assessment: Exceptions to WDL Neuro Checks:   Initial (02/11/23 1201)  Has TPA been given? No If patient is a Neuro Trauma and patient is going to OR before floor call report to 4N Charge nurse: [phone] or [phone]   R Recommendations: See Admitting Provider Note  Report given to:   Additional Notes:

## 2023-02-12 NOTE — Evaluation (Signed)
 Speech Language Pathology Evaluation Patient Details Name: Kathleen Figueroa MRN: [id number] DOB: 1935-12-09 Today's Date: 02/12/2023 Time: 8364-8347 SLP Time Calculation (min) (ACUTE ONLY): 17 min  Problem List:  Patient Active Problem List   Diagnosis Date Noted   History of CVA (cerebrovascular accident) 02/11/2023   Acute CVA (cerebrovascular accident) (HCC) 02/11/2023   Hypokalemia 11/12/2022   S/P carotid endarterectomy 05/28/2014   Carotid artery disease (HCC) 01/14/2014   Essential hypertension    Hyperlipidemia 01/12/2014   Diabetes mellitus without complication [facility])    Past Medical History:  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)    CVA (cerebral vascular accident) (HCC) 11/12/2022   Diabetes mellitus type 2, uncontrolled, 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   H/O detached retina repair    Hypertension    TIA (transient ischemic attack)    Past Surgical History:  Past Surgical History:  Procedure Laterality Date   ENDARTERECTOMY Left 01/19/2014   Procedure: ENDARTERECTOMY CAROTID-LEFT;  Surgeon: Gaile LELON New, MD;  Location: [facility] OR;  Service: Vascular;  Laterality: Left;   EYE SURGERY     lens removed from right eye   PATCH ANGIOPLASTY Left 01/19/2014   Procedure: PATCH ANGIOPLASTY, LEFT CAROTID ARTERY USING HEMASHIELD PLATINUM FINESSE PATCH;  Surgeon: Gaile LELON New, MD;  Location: [facility] OR;  Service: Vascular;  Laterality: Left;   TONSILLECTOMY     HPI:  Kathleen Figueroa is an 88 yo female presenting to ED 1/2 with L arm weakness and possible fall. MRI Brain shows large acute R PCA infarct. Previously seen by SLP 11/13/22 with occasional word-finding problems that persisted  since prior CVA, but otherwise functional auditory comprehension and abstract language. PMH includes HTN, HLD, CAD, prior CVA   Assessment / Plan / Recommendation Clinical Impression  Pt reports she lives with her daughter and son in law, where she typically handles all medications and finances independently, although states she has not been doing a good job. She expands to say that she has been missing doses due to negligence. Pt scored WFL on all tasks related to problem solving, naming, auditory comprehension, and attention. She had mild difficulty with memory retrieval that pt's daughter states is slightly different than baseline. Of note, pt has significant vision deficits that are now exacerbated. She was unable to locate shapes drawn on the paper and only able to make out the outline of faces, which her daughter reports is significantly different than baseline. Feel that pt is likely close to her cognitive-linguistic baseline but may benefit from ongoing f/u at her next venue of care to facilitate independence with functional tasks. SLP to s/o acutely.    SLP Assessment  SLP Recommendation/Assessment: All further Speech Lanaguage Pathology  needs can be addressed in the next venue of care SLP Visit Diagnosis: Cognitive communication deficit (R41.841)    Recommendations for follow up therapy are one component of a multi-disciplinary discharge planning process, led by the attending physician.  Recommendations may be updated based on patient status, additional functional criteria and insurance authorization.    Follow Up Recommendations  Skilled nursing-short term rehab (<3 hours/day)    Assistance Recommended at Discharge  Frequent or constant Supervision/Assistance  Functional Status Assessment Patient has  had a recent decline in their functional status and demonstrates the ability to make significant improvements in function in a reasonable and predictable amount of time.  Frequency and  Duration           SLP Evaluation Cognition  Overall Cognitive Status: History of cognitive impairments - at baseline Arousal/Alertness: Awake/alert Orientation Level: Oriented X4 Attention: Sustained Sustained Attention: Appears intact Memory: Impaired Memory Impairment: Retrieval deficit Awareness: Appears intact Problem Solving: Impaired Problem Solving Impairment: Verbal basic       Comprehension  Auditory Comprehension Overall Auditory Comprehension: Appears within functional limits for tasks assessed    Expression Expression Primary Mode of Expression: Verbal Verbal Expression Overall Verbal Expression: Appears within functional limits for tasks assessed   Oral / Motor  Oral Motor/Sensory Function Overall Oral Motor/Sensory Function: Within functional limits Motor Speech Overall Motor Speech: Appears within functional limits for tasks assessed            Damien Blumenthal, M.A., CF-SLP Speech Language Pathology, Acute Rehabilitation Services  Secure Chat preferred [phone]  02/12/2023, 5:11 PM

## 2023-02-13 DIAGNOSIS — I639 Cerebral infarction, unspecified: Secondary | ICD-10-CM | POA: Diagnosis not present

## 2023-02-13 LAB — LACTIC ACID, PLASMA: Lactic Acid, Venous: 1.1 mmol/L (ref 0.5–1.9)

## 2023-02-13 LAB — BASIC METABOLIC PANEL
Anion gap: 18 — ABNORMAL HIGH (ref 5–15)
Anion gap: 14 (ref 5–15)
BUN: 15 mg/dL (ref 8–23)
BUN: 18 mg/dL (ref 8–23)
CO2: 23 mmol/L (ref 22–32)
CO2: 22 mmol/L (ref 22–32)
Calcium: 8.6 mg/dL — ABNORMAL LOW (ref 8.9–10.3)
Calcium: 9 mg/dL (ref 8.9–10.3)
Chloride: 99 mmol/L (ref 98–111)
Chloride: 99 mmol/L (ref 98–111)
Creatinine, Ser: 1.13 mg/dL — ABNORMAL HIGH (ref 0.44–1.00)
Creatinine, Ser: 1.19 mg/dL — ABNORMAL HIGH (ref 0.44–1.00)
GFR, Estimated: 48 mL/min — ABNORMAL LOW (ref >60)
GFR, Estimated: 45 mL/min — ABNORMAL LOW (ref >60)
Glucose, Bld: 100 mg/dL — ABNORMAL HIGH (ref 70–99)
Glucose, Bld: 151 mg/dL — ABNORMAL HIGH (ref 70–99)
Potassium: 3.1 mmol/L — ABNORMAL LOW (ref 3.5–5.1)
Potassium: 3.9 mmol/L (ref 3.5–5.1)
Sodium: 140 mmol/L (ref 135–145)
Sodium: 135 mmol/L (ref 135–145)

## 2023-02-13 LAB — MAGNESIUM: Magnesium: 1.7 mg/dL (ref 1.7–2.4)

## 2023-02-13 LAB — HEMOGLOBIN A1C
Hgb A1c MFr Bld: 6.4 % — ABNORMAL HIGH (ref 4.8–5.6)
Mean Plasma Glucose: 137 mg/dL

## 2023-02-13 LAB — BETA-HYDROXYBUTYRIC ACID: Beta-Hydroxybutyric Acid: 0.11 mmol/L (ref 0.05–0.27)

## 2023-02-13 MED ORDER — POTASSIUM CHLORIDE CRYS ER 20 MEQ PO TBCR
40.0000 meq | EXTENDED_RELEASE_TABLET | ORAL | Status: AC
  Administered 2023-02-13 (×2): 40 meq via ORAL
  Filled 2023-02-13 (×2): qty 2

## 2023-02-13 MED ORDER — LACTATED RINGERS IV SOLN: INTRAVENOUS | Status: DC

## 2023-02-13 MED ORDER — HEPARIN SODIUM (PORCINE) 5000 UNIT/ML IJ SOLN
5000.0000 [IU] | Freq: Three times a day (TID) | INTRAMUSCULAR | Status: DC
  Administered 2023-02-13 – 2023-02-18 (×16): 5000 [IU] via SUBCUTANEOUS
  Filled 2023-02-13 (×17): qty 1

## 2023-02-13 NOTE — Progress Notes (Signed)
 [facility] Progress Note Patient: Kathleen Figueroa FMW:[id number] DOB: 1936-01-06 DOA: 02/11/2023  DOS: the patient was seen and examined on 02/13/2023  Brief Hospital Course: PMH of CVA, HTN, type II DM, PAD presented to hospital with numbness of left-sided weakness and vision deficit. Found to have acute stroke.  Neurology was consulted. Now recommending aspirin  and Brilinta  as long as her insurance will cover.  Assessment and plan. Acute ischemic infarct right PCA territory. Left homonymous hemianopsia. Right gaze preference.  Left-sided weakness. CT scan large acute/subacute right PCA infarct without any hemorrhage. CTA shows multiple occlusions. MRI shows petechial hemorrhage and edema without any mass effect. Echocardiogram shows preserved EF. LDL 193. Most likely noncompliant with medical regimen on discharge. Plavix  75 mg prior to admission.   Currently on aspirin  and Brilinta  for 90 days.   If medication is not covered by insurance aspirin  Brilinta  for 30 days. PT OT recommending SNF.   PAD. Multiple sclerosis seen on the CT scan. Nonsignificant that requires acute intervention. Has prior history of carotid artery intervention. Monitor.   HTN. Blood pressure mildly elevated. Avoid hypotension.   HLD. LDL 193. On Lipitor  80 mg.   Type 2 diabetes mellitus, well-controlled without any insulin .  With CKD. Currently on sliding scale insulin .  Hypokalemia. Replaced. Magnesium  stable.  CKD 3A.  Baseline serum creatinine around 1.3. Anion gap metabolic acidosis. Baseline creatinine around 1.3. Morning labs actually show some anion gap elevation. Will recheck the lab. Check lactic acid and beta-hydroxybutyrate as needed will.   Subjective: No nausea no vomiting no fever no chills.  No other complaint.  Still tells me she is doing fine but then when asked about her vision tells me that that is not fine.  Physical Exam: General: in Mild distress, No  Rash Cardiovascular: S1 and S2 Present, No Murmur Respiratory: Good respiratory effort, Bilateral Air entry present. No Crackles, No wheezes Abdomen: Bowel Sound present, No tenderness Extremities: No edema Neuro: Alert and oriented x3, no new focal deficit Mild left-sided weakness.  Ongoing left neglect.  Left homonymous hemianopsia seen again.  Data Reviewed: I have Reviewed nursing notes, Vitals, and Lab results. Since last encounter, pertinent lab results CBC and BMP   . I have ordered test including CBC and BMP  .   Disposition: Status is: Inpatient Remains inpatient appropriate because: Monitor for improvement in symptoms and awaiting placement  heparin  injection 5,000 Units Start: 02/13/23 1400 SCD's Start: 02/11/23 1734   Family Communication: No one at bedside Level of care: Telemetry Medical continue indefinitely Vitals:   02/13/23 0357 02/13/23 0738 02/13/23 1107 02/13/23 1610  BP: (!) 195/93 (!) 153/82 131/77 (!) 167/89  Pulse: 71 82 77 79  Resp: 16 16 14 16   Temp: 98.6 F (37 C) 99.8 F (37.7 C) 98.1 F (36.7 C) 98.2 F (36.8 C)  TempSrc: Oral Oral Oral Oral  SpO2: 98% 100% 98% 100%  Weight:      Height:         Author: Yetta Blanch, MD 02/13/2023 6:31 PM  Please look on [email or web address] to find out who is on call.

## 2023-02-13 NOTE — Progress Notes (Signed)
 Patient's daughter is requesting to talk to the Dr tomorrow . Her phone [phone] concerning patients prognosis .

## 2023-02-13 NOTE — Progress Notes (Signed)
 Pt family expressed some concern related to pt's visual neglect. Family worried pt can't see her food and that she is unable to track people at bedside. Nursing assessed pt and talked with family about a possible eye patch for R eye. Will speak to therapy about this later in am. Pt resting w/ eyes closed, call bell in reach, no signs of distress

## 2023-02-13 NOTE — Plan of Care (Signed)
 Anxious, but trying to move her left arm from time to time. Follows instructions and cooperative.  Problem: Education: Goal: Knowledge of disease or condition will improve Outcome: Progressing   Problem: Ischemic Stroke/TIA Tissue Perfusion: Goal: Complications of ischemic stroke/TIA will be minimized Outcome: Progressing   Problem: Activity: Goal: Risk for activity intolerance will decrease Outcome: Progressing   Problem: Coping: Goal: Level of anxiety will decrease Outcome: Progressing   Problem: Safety: Goal: Ability to remain free from injury will improve Outcome: Progressing

## 2023-02-13 NOTE — Plan of Care (Signed)
  Problem: Education: Goal: Knowledge of disease or condition will improve Outcome: Progressing Goal: Knowledge of secondary prevention will improve (MUST DOCUMENT ALL) Outcome: Progressing Goal: Knowledge of patient specific risk factors will improve Alonso N/A or DELETE if not current risk factor) Outcome: Progressing   Problem: Ischemic Stroke/TIA Tissue Perfusion: Goal: Complications of ischemic stroke/TIA will be minimized Outcome: Progressing   Problem: Self-Care: Goal: Ability to participate in self-care as condition permits will improve Outcome: Progressing Goal: Verbalization of feelings and concerns over difficulty with self-care will improve Outcome: Progressing Goal: Ability to communicate needs accurately will improve Outcome: Progressing   Problem: Activity: Goal: Risk for activity intolerance will decrease Outcome: Progressing   Problem: Safety: Goal: Ability to remain free from injury will improve Outcome: Progressing

## 2023-02-14 DIAGNOSIS — I639 Cerebral infarction, unspecified: Secondary | ICD-10-CM | POA: Diagnosis not present

## 2023-02-14 LAB — MAGNESIUM: Magnesium: 1.4 mg/dL — ABNORMAL LOW (ref 1.7–2.4)

## 2023-02-14 LAB — BASIC METABOLIC PANEL
Anion gap: 10 (ref 5–15)
BUN: 13 mg/dL (ref 8–23)
CO2: 25 mmol/L (ref 22–32)
Calcium: 8.3 mg/dL — ABNORMAL LOW (ref 8.9–10.3)
Chloride: 101 mmol/L (ref 98–111)
Creatinine, Ser: 1.01 mg/dL — ABNORMAL HIGH (ref 0.44–1.00)
GFR, Estimated: 55 mL/min — ABNORMAL LOW (ref >60)
Glucose, Bld: 135 mg/dL — ABNORMAL HIGH (ref 70–99)
Potassium: 3.5 mmol/L (ref 3.5–5.1)
Sodium: 136 mmol/L (ref 135–145)

## 2023-02-14 MED ORDER — MAGNESIUM SULFATE 4 GM/100ML IV SOLN
4.0000 g | Freq: Once | INTRAVENOUS | Status: AC
  Administered 2023-02-14: 4 g via INTRAVENOUS
  Filled 2023-02-14: qty 100

## 2023-02-14 NOTE — Plan of Care (Signed)
 Because of her visual deficit, this nurse fed her for breakfast.  She did attempt but quit because she became discouraged because of the struggle.  I did offer to put on an eye patch while she ate but she declined and said she will just not eat right now.  I went ahead and talked her into allowing me to feed her and she ate 100%.  Family at side now and did speak at great length with Dr Raenelle.  All questions answered.    Problem: Education: Goal: Knowledge of disease or condition will improve Outcome: Progressing   Problem: Education: Goal: Knowledge of secondary prevention will improve (MUST DOCUMENT ALL) Outcome: Progressing   Problem: Ischemic Stroke/TIA Tissue Perfusion: Goal: Complications of ischemic stroke/TIA will be minimized Outcome: Progressing   Problem: Self-Care: Goal: Ability to participate in self-care as condition permits will improve Outcome: Progressing   Problem: Self-Care: Goal: Ability to communicate needs accurately will improve Outcome: Progressing   Problem: Self-Care: Goal: Verbalization of feelings and concerns over difficulty with self-care will improve Outcome: Progressing   Problem: Nutrition: Goal: Risk of aspiration will decrease Outcome: Progressing   Problem: Nutrition: Goal: Dietary intake will improve Outcome: Progressing

## 2023-02-14 NOTE — Plan of Care (Signed)
  Problem: Education: Goal: Knowledge of secondary prevention will improve (MUST DOCUMENT ALL) Outcome: Progressing Goal: Knowledge of patient specific risk factors will improve Loraine Leriche N/A or DELETE if not current risk factor) Outcome: Progressing   Problem: Ischemic Stroke/TIA Tissue Perfusion: Goal: Complications of ischemic stroke/TIA will be minimized Outcome: Progressing   Problem: Coping: Goal: Will verbalize positive feelings about self Outcome: Progressing

## 2023-02-14 NOTE — NC FL2 (Signed)
 [place]  MEDICAID FL2 LEVEL OF CARE FORM     IDENTIFICATION  Patient Name: Kathleen Figueroa Birthdate: 07-30-1935 Sex: female Admission Date (Current Location): 02/11/2023  [facility] and [place] Number:  Producer, Television/film/video and Address:  [facility] [place], [address], [place], [place] [address]      Provider Number: [id number]  Attending Physician Name and Address:  Raenelle Coria, MD  Relative Name and Phone Number:  Daughter Tempie Battiest [phone]    Current Level of Care: Hospital Recommended Level of Care: Skilled Nursing Facility Prior Approval Number:    Date Approved/Denied: 02/14/23 PASRR Number: [id number]  Discharge Plan: SNF    Current Diagnoses: Patient Active Problem List   Diagnosis Date Noted   History of CVA (cerebrovascular accident) 02/11/2023   Acute CVA (cerebrovascular accident) (HCC) 02/11/2023   Hypokalemia 11/12/2022   S/P carotid endarterectomy 05/28/2014   Carotid artery disease (HCC) 01/14/2014   Essential hypertension    Hyperlipidemia 01/12/2014   Diabetes mellitus without complication (HCC)     Orientation RESPIRATION BLADDER Height & Weight     Self, Time, Situation, Place  Normal Incontinent Weight: 140 lb (63.5 kg) Height:  5' 6 (167.6 cm)  BEHAVIORAL SYMPTOMS/MOOD NEUROLOGICAL BOWEL NUTRITION STATUS      Continent Diet (Regular fluid)  AMBULATORY STATUS COMMUNICATION OF NEEDS Skin   Limited Assist Verbally Normal                       Personal Care Assistance Level of Assistance  Dressing     Dressing Assistance: Limited assistance     Functional Limitations Info  Sight Sight Info: Impaired        SPECIAL CARE FACTORS FREQUENCY  PT (By licensed PT), OT (By licensed OT)     PT Frequency: 5x weekly OT Frequency: 3x weekly            Contractures Contractures Info: Not present    Additional Factors Info  Code Status (.) Code Status Info: Full             Current  Medications (02/14/2023):  This is the current hospital active medication list Current Facility-Administered Medications  Medication Dose Route Frequency Provider Last Rate Last Admin   acetaminophen  (TYLENOL ) tablet 650 mg  650 mg Oral Q4H PRN Melvin, Alexander B, MD       Or   acetaminophen  (TYLENOL ) 160 MG/5ML solution 650 mg  650 mg Per Tube Q4H PRN Melvin, Alexander B, MD       Or   acetaminophen  (TYLENOL ) suppository 650 mg  650 mg Rectal Q4H PRN Seena Marsa NOVAK, MD       aspirin  EC tablet 81 mg  81 mg Oral Daily Franky Redia SAILOR, MD   81 mg at 02/13/23 1018   atorvastatin  (LIPITOR ) tablet 80 mg  80 mg Oral Daily Lehner, Erin C, NP   80 mg at 02/13/23 1018   heparin  injection 5,000 Units  5,000 Units Subcutaneous Q8H Patel, Pranav M, MD   5,000 Units at 02/14/23 9465   lactated ringers  infusion   Intravenous Continuous Patel, Pranav M, MD 75 mL/hr at 02/13/23 1854 New Bag at 02/13/23 1854   senna-docusate (Senokot-S) tablet 1 tablet  1 tablet Oral QHS PRN Melvin, Alexander B, MD       ticagrelor  (BRILINTA ) tablet 90 mg  90 mg Oral BID Lehner, Erin C, NP   90 mg at 02/13/23 2104     Discharge Medications:  Please see discharge summary for a list of discharge medications.  Relevant Imaging Results:  Relevant Lab Results:   Additional Information SS#: [id number]  Darin JULIANNA Bend, LCSW

## 2023-02-14 NOTE — Progress Notes (Signed)
 [facility] Progress Note Patient: Kathleen Figueroa FMW:[id number] DOB: January 11, 1936 DOA: 02/11/2023  DOS: the patient was seen and examined on 02/14/2023  Brief Hospital Course: PMH of CVA, HTN, type II DM, PAD presented to hospital with numbness and left-sided weakness and vision deficit. Found to have acute stroke.  Neurology was consulted. Waiting for rehab.  Assessment and plan. Acute ischemic infarct right PCA territory. Left homonymous hemianopsia. Right gaze preference.  Left-sided weakness. CT scan large acute/subacute right PCA infarct without any hemorrhage. CTA shows multiple occlusions. MRI shows petechial hemorrhage and edema without any mass effect. Echocardiogram shows preserved EF. LDL 193. Most likely noncompliant with medical regimen on discharge. Plavix  75 mg prior to admission.   Currently on aspirin  and Brilinta  for 90 days then aspirin  alone. PT OT recommending SNF.   PAD. Multiple atherosclerosis seen on the CT scan. Nonsignificant that requires acute intervention. Has prior history of carotid artery intervention. Monitor.   HTN. Blood pressure stable today.   HLD. LDL 193. On Lipitor  80 mg.   Type 2 diabetes mellitus, well-controlled without any insulin .  With CKD. Currently on sliding scale insulin .  Hypokalemia. Replaced. Hypomagnesemia: Replace aggressively today.  CKD 3A.  Baseline serum creatinine around 1.3. Anion gap metabolic acidosis. Baseline creatinine around 1.3. At about her baseline.  Anion gap closed with fluid.  Was for oral intake. Stable to transfer to SNF when bed available.   Subjective: Patient seen and examined.  Alert awake and oriented.  Left-sided hemineglect present.  Her son and daughter at the bedside.  Daughter requested to be evaluated for CIR.  Social worker updated.  Physical Exam: General: Looks fairly comfortable. Cardiovascular: S1-S2 normal.  Regular rate rhythm. Respiratory: Bilateral clear.  No added  sounds. Gastrointestinal: Soft.  Nontender.  Bowel sound present. Ext: No swelling or edema. Neuro: Alert and awake. Mild left-sided weakness.  Ongoing left neglect.  Left homonymous hemianopsia seen again.  Data Reviewed: I have Reviewed nursing notes, Vitals, and Lab results. Since last encounter, pertinent lab results CBC and BMP   . I have ordered test including CBC and BMP  .   Disposition: Status is: Inpatient Remains inpatient appropriate because: Awaiting rehab.  heparin  injection 5,000 Units Start: 02/13/23 1400 SCD's Start: 02/11/23 1734   Family Communication: Son and daughter at the bedside. Level of care: Telemetry Medical continue indefinitely Vitals:   02/13/23 1610 02/14/23 0429 02/14/23 0800 02/14/23 1223  BP: (!) 167/89 (!) 187/85 (!) 154/72 (!) 127/90  Pulse: 79 76 78 79  Resp: 16 16 18 15   Temp: 98.2 F (36.8 C) 98 F (36.7 C) 98.6 F (37 C) 98.3 F (36.8 C)  TempSrc: Oral Oral Oral Axillary  SpO2: 100% 97%  97%  Weight:      Height:         Author: Renato Applebaum, MD 02/14/2023 2:34 PM

## 2023-02-14 NOTE — TOC Initial Note (Addendum)
 Transition of Care [facility]) - Initial/Assessment Note   Patient Details  Name: Kathleen Figueroa MRN: [id number] Date of Birth: 10-Mar-1935  [facility] CM/SW Contact:    Darin JULIANNA Bend, LCSW Phone Number: 02/14/2023, 9:32 AM  Clinical Narrative:                 CSW followed-up disposition recommendations (SNF placement).  CSW completed initial TOC work-up/assessment as noted by the following below.   CSW spoke with the patient at the bedside to review SNF referral process per clinical recommendations and assessed the pt's SNF preference.   The patient expressed  Spoke with the daughter Nickey and Belvie (son-in-law) and brother Uyen Eichholz and sh stated she would rather have the patient  admitted to [facility] and if ot possible, she would rather a SNF in [place] only.    CSW updated the bedside nurse and additional clinical members to the information above regarding SNF initial efforts for SNF placement.    CSW referral efforts to support the patient's disposition FL2:  PASRR:  SNF referrals:   Note: They would like CIR or only SNF facilities in [place]. Please contact the older brother Shellia Hartl [phone] he is the holder of her long-term care insurance.   TOC Disposition follow-up needs  Please provide the patient or natural support with bed-offer updates.  Please continue with SNF placement efforts. Please update the clinical team to SNF placement efforts:  No other needs identified by this clinical research associate currently. Patient needs and current disposition to be followed by    Expected Discharge Plan: Skilled Nursing Facility Barriers to Discharge: Continued Medical Work up   Patient Goals and CMS Choice Patient states their goals for this hospitalization and ongoing recovery are:: To get better   Choice offered to / list presented to : Patient, Adult Children      Expected Discharge Plan and Services In-house Referral: Clinical Social Work   Post  Acute Care Choice: Skilled Nursing Facility Living arrangements for the past 2 months: Single Family Home                                      Prior Living Arrangements/Services Living arrangements for the past 2 months: Single Family Home Lives with:: Self Patient language and need for interpreter reviewed:: No Do you feel safe going back to the place where you live?: Yes      Need for Family Participation in Patient Care: Yes (Comment) Care giver support system in place?: Yes (comment)   Criminal Activity/Legal Involvement Pertinent to Current Situation/Hospitalization: No - Comment as needed  Activities of Daily Living   ADL Screening (condition at time of admission) Independently performs ADLs?: No Does the patient have a NEW difficulty with bathing/dressing/toileting/self-feeding that is expected to last >3 days?: Yes (Initiates electronic notice to provider for possible OT consult) (stroke workup) Does the patient have a NEW difficulty with getting in/out of bed, walking, or climbing stairs that is expected to last >3 days?: Yes (Initiates electronic notice to provider for possible PT consult) (stroke workup) Does the patient have a NEW difficulty with communication that is expected to last >3 days?: No Is the patient deaf or have difficulty hearing?: Yes Does the patient have difficulty seeing, even when wearing glasses/contacts?: No Does the patient have difficulty concentrating, remembering, or making decisions?: No  Permission Sought/Granted Permission sought to share information with : Case  Manager, Magazine Features Editor, Family Supports Permission granted to share information with : Yes, Verbal Permission Granted        Permission granted to share info w Relationship: Daughter  Permission granted to share info w Contact Information: Tempie Battiest  Emotional Assessment       Orientation: : Oriented to Self, Oriented to Place, Oriented to  Time,  Oriented to Situation Alcohol  / Substance Use: Not Applicable Psych Involvement: No (comment)  Admission diagnosis:  Hypokalemia [E87.6] Acute CVA (cerebrovascular accident) (HCC) [I63.9] Cerebrovascular accident (CVA), unspecified mechanism (HCC) [I63.9] Patient Active Problem List   Diagnosis Date Noted   History of CVA (cerebrovascular accident) 02/11/2023   Acute CVA (cerebrovascular accident) (HCC) 02/11/2023   Hypokalemia 11/12/2022   S/P carotid endarterectomy 05/28/2014   Carotid artery disease (HCC) 01/14/2014   Essential hypertension    Hyperlipidemia 01/12/2014   Diabetes mellitus without complication (HCC)    PCP:  Katina Pfeiffer, PA-C Pharmacy:   [facility] [address] [place] - [address] [place] [address] Phone: [phone] Fax: [phone]  [facility] [address] [place] [address] Phone: [phone] Fax: [phone]     Social Drivers of Health (SDOH) Social History: SDOH Screenings   Food Insecurity: No Food Insecurity (02/11/2023)  Housing: Low Risk  (02/11/2023)  Transportation Needs: No Transportation Needs (02/11/2023)  Utilities: Not At Risk (02/11/2023)  Social Connections: Moderately Isolated (02/11/2023)  Tobacco Use: Medium Risk (02/11/2023)   SDOH Interventions:     Readmission Risk Interventions     No data to display

## 2023-02-15 DIAGNOSIS — I639 Cerebral infarction, unspecified: Secondary | ICD-10-CM | POA: Diagnosis not present

## 2023-02-15 NOTE — Progress Notes (Signed)

## 2023-02-15 NOTE — Plan of Care (Signed)

## 2023-02-15 NOTE — Care Management Important Message (Signed)
 Important Message  Patient Details  Name: Kathleen Figueroa MRN: [id number] Date of Birth: [birth year]   Important Message Given:  Yes - Medicare IM     [name] 02/15/2023, 3:00 PM

## 2023-02-15 NOTE — Progress Notes (Addendum)
 Physical Therapy Treatment Patient Details Name: Kathleen Figueroa MRN: [id number] DOB: [birth year] Today's Date: 02/15/2023   History of Present Illness 88 y.o. female presented to ED 1/2 with left sided weakness and vision deficit., possible fall. PMHx: hypertension, hyperlipidemia, diabetes, carotid artery disease, CVA.    PT Comments  Pt resting in bed on arrival and agreeable to session with steady progress towards acute goals. Pt requiring min A to come to sit EOB, mod A to power up to stand with HHA and step pivot to chair, and max A to take a few steps away from EOB as pt with R lateral lean in sitting and standing needing multimodal cues to correct. Pt continues to endorse impaired sensation on L, stating she cannot tell if it is touching the floor or is she is bearing weight through L, as well as L neglect needed max cues to attend and look to L throughout session.  Discussed with supervising PT and updating recommendation as patient will benefit from intensive inpatient follow up therapy, >3 hours/day to address deficits and maximize functional independence. Pt continues to benefit from skilled PT services to progress toward functional mobility goals.      If plan is discharge home, recommend the following: Two people to help with walking and/or transfers;A lot of help with bathing/dressing/bathroom;Assistance with cooking/housework;Direct supervision/assist for medications management;Assist for transportation   Can travel by private vehicle     No  Equipment Recommendations  None recommended by PT (TBD next venue)    Recommendations for Other Services Rehab consult     Precautions / Restrictions Precautions Precautions: Fall Precaution Comments: left hemiparesis Restrictions Weight Bearing Restrictions Per Provider Order: No     Mobility  Bed Mobility Overal bed mobility: Needs Assistance Bed Mobility: Supine to Sit     Supine to sit: Min assist     General bed mobility  comments: Assist with bringing trunk up to sitting and hips around to the surface.    Transfers Overall transfer level: Needs assistance Equipment used: 1 person hand held assist, Rolling walker (2 wheels) Transfers: Sit to/from Stand, Bed to chair/wheelchair/BSC Sit to Stand: Mod assist   Step pivot transfers: Mod assist       General transfer comment: Pt with continued motor planning and proprioceptive deficits in the LLE, stating she couldn't tell she was standing on it or if it was taouching floor.  mod A to power up to standing, trial of RW initially, does better with HHA on R as pt unable to maintain grip with L hand. strong R lateral lean this session. mod A to steady to step to chair with max cues for hand placement on R    Ambulation/Gait Ambulation/Gait assistance: Max assist Gait Distance (Feet): 3 Feet Assistive device: 1 person hand held assist Gait Pattern/deviations: Step-to pattern, Decreased stance time - left, Decreased dorsiflexion - left, Decreased weight shift to left, Ataxic, Wide base of support, Drifts right/left       General Gait Details: pt able to take a few steps away from EOB, very ataxic on L with max cues for standing balance as pt with strong R lateral lean   Stairs             Wheelchair Mobility     Tilt Bed    Modified Rankin (Stroke Patients Only)       Balance Overall balance assessment: Needs assistance Sitting-balance support: No upper extremity supported, Feet supported Sitting balance-Leahy Scale: Fair Sitting balance - Comments: CGA  Standing balance support: Bilateral upper extremity supported Standing balance-Leahy Scale: Poor Standing balance comment: Pt needs extensive assist for mobility                            Cognition Arousal: Alert Behavior During Therapy: WFL for tasks assessed/performed Overall Cognitive Status: History of cognitive impairments - at baseline                                  General Comments: Pt with some delays when answering questions.  Oriented to place, month, and year.        Exercises      General Comments        Pertinent Vitals/Pain Pain Assessment Pain Assessment: No/denies pain    Home Living                          Prior Function            PT Goals (current goals can now be found in the care plan section) Acute Rehab PT Goals Patient Stated Goal: Get well PT Goal Formulation: With patient Time For Goal Achievement: 02/26/23 Progress towards PT goals: Progressing toward goals    Frequency    Min 1X/week      PT Plan      Co-evaluation              AM-PAC PT 6 Clicks Mobility   Outcome Measure  Help needed turning from your back to your side while in a flat bed without using bedrails?: A Little Help needed moving from lying on your back to sitting on the side of a flat bed without using bedrails?: A Lot Help needed moving to and from a bed to a chair (including a wheelchair)?: A Lot Help needed standing up from a chair using your arms (e.g., wheelchair or bedside chair)?: A Lot Help needed to walk in hospital room?: Total Help needed climbing 3-5 steps with a railing? : Total 6 Click Score: 11    End of Session Equipment Utilized During Treatment: Gait belt Activity Tolerance: Patient tolerated treatment well Patient left: with call bell/phone within reach;in chair;with chair alarm set;with family/visitor present Nurse Communication: Mobility status PT Visit Diagnosis: Unsteadiness on feet (R26.81);Other abnormalities of gait and mobility (R26.89);Hemiplegia and hemiparesis;Difficulty in walking, not elsewhere classified (R26.2);Ataxic gait (R26.0) Hemiplegia - Right/Left: Left     Time: 8849-8777 PT Time Calculation (min) (ACUTE ONLY): 32 min  Charges:    $Therapeutic Activity: 23-37 mins PT General Charges $$ ACUTE PT VISIT: 1 Visit                     Taiden Raybourn R.  PTA Acute Rehabilitation Services Office: [phone]   Therisa CHRISTELLA Boor 02/15/2023, 12:36 PM

## 2023-02-15 NOTE — TOC Progression Note (Signed)
 Transition of Care [facility]) - Progression Note    Patient Details  Name: Kathleen Figueroa MRN: [id number] Date of Birth: [birth year]  Transition of Care [facility]) CM/SW Contact  Andrez JULIANNA George, RN Phone Number: 02/15/2023, 10:34 AM  Clinical Narrative:     Family is requesting CIR. CM has asked PT to see if they feel she can tolerate CiR when they see her today. CM has also sent message to CIR admissions to follow.  Family says they can provide 24 hour care with caregivers after CIR. TOC following.  Expected Discharge Plan: Skilled Nursing Facility Barriers to Discharge: Continued Medical Work up  Expected Discharge Plan and Services In-house Referral: Clinical Social Work   Post Acute Care Choice: Skilled Nursing Facility Living arrangements for the past 2 months: Single Family Home                                       Social Determinants of Health (SDOH) Interventions SDOH Screenings   Food Insecurity: No Food Insecurity (02/11/2023)  Housing: Low Risk  (02/11/2023)  Transportation Needs: No Transportation Needs (02/11/2023)  Utilities: Not At Risk (02/11/2023)  Social Connections: Moderately Isolated (02/11/2023)  Tobacco Use: Medium Risk (02/11/2023)    Readmission Risk Interventions     No data to display

## 2023-02-15 NOTE — Progress Notes (Signed)
 [facility] Progress Note Patient: Carolyna Yerian FMW:[id number] DOB: [birth year] DOA: 02/11/2023  DOS: the patient was seen and examined on 02/15/2023  Brief Hospital Course: PMH of CVA, HTN, type II DM, PAD presented to hospital with numbness and left-sided weakness and vision deficit. Found to have acute stroke.  Neurology was consulted. Waiting for rehab.  Assessment and plan. Acute ischemic infarct right PCA territory. Left homonymous hemianopsia. Right gaze preference.  Left-sided weakness. CT scan large acute/subacute right PCA infarct without any hemorrhage. CTA shows multiple occlusions. MRI shows petechial hemorrhage and edema without any mass effect. Echocardiogram shows preserved EF. LDL 193. Most likely noncompliant with medical regimen on discharge. Plavix  75 mg prior to admission.   Currently on aspirin  and Brilinta  for 90 days then aspirin  alone. PT OT recommending CIR now.   PAD. Multiple atherosclerosis seen on the CT scan. Nonsignificant that requires acute intervention. Has prior history of carotid artery intervention. Monitor.   HTN. Blood pressure stable today.   HLD. LDL 193. On Lipitor  80 mg.   Type 2 diabetes mellitus, well-controlled without any insulin .  With CKD. Currently on sliding scale insulin .  Hypokalemia. Replaced. Hypomagnesemia: Replaced .  CKD 3A.  Baseline serum creatinine around 1.3. Anion gap metabolic acidosis. Baseline creatinine around 1.3.  Creatinine 1.01. Encourage oral intake.   Subjective: Seen in the morning rounds.  Neurologically remains about the same.  Needs a lot of assistance to get out of the bed.  No family at the bedside in the morning rounds.  Patient denies any complaints.  Physical Exam: General: Looks fairly comfortable. Cardiovascular: S1-S2 normal.  Regular rate rhythm. Respiratory: Bilateral clear.  No added sounds. Gastrointestinal: Soft.  Nontender.  Bowel sound present. Ext: No swelling or  edema. Neuro: Alert and awake. Mild left-sided weakness.  Ongoing left neglect.  Left homonymous hemianopsia .  Data Reviewed: I have Reviewed nursing notes, Vitals, and Lab results. Since last encounter, pertinent lab results CBC and BMP   . I have ordered test including CBC and BMP  .   Disposition: Status is: Inpatient Remains inpatient appropriate because: Awaiting rehab.  heparin  injection 5,000 Units Start: 02/13/23 1400 SCD's Start: 02/11/23 1734   Family Communication: Son and daughter at the bedside. Level of care: Telemetry Medical continue indefinitely Vitals:   02/15/23 0120 02/15/23 0312 02/15/23 0724 02/15/23 1123  BP: (!) 154/75 (!) 152/76 (!) 176/82 134/60  Pulse: 64 61 69 74  Resp: 16 16 18 18   Temp: 98.9 F (37.2 C) 98.2 F (36.8 C) 98.1 F (36.7 C) 99.3 F (37.4 C)  TempSrc: Oral Oral Oral Oral  SpO2: 100% 100% 97% 96%  Weight:      Height:         Author: Renato Applebaum, MD 02/15/2023 1:08 PM

## 2023-02-16 DIAGNOSIS — H53462 Homonymous bilateral field defects, left side: Secondary | ICD-10-CM | POA: Diagnosis not present

## 2023-02-16 DIAGNOSIS — G8194 Hemiplegia, unspecified affecting left nondominant side: Secondary | ICD-10-CM

## 2023-02-16 DIAGNOSIS — I639 Cerebral infarction, unspecified: Secondary | ICD-10-CM | POA: Diagnosis not present

## 2023-02-16 NOTE — Consult Note (Signed)
 Physical Medicine and Rehabilitation Consult Reason for Consult: Left-sided weakness and visual deficits after stroke Referring Physician: Raenelle   HPI: Kathleen Figueroa is a 88 y.o. female with a history of prior CVA, hypertension, type 2 diabetes, peripheral artery disease who presented on 02/11/2023 with left-sided weakness and visual deficits.  CT scan of the head revealed a large acute/subacute right PCA infarct without hemorrhage.  CTA demonstrated occluded proximal right fetal PCA and occluded intradural right vertebral artery with severe left P2 stenosis and severe left vertebral artery origin stenosis.  MRI revealed large acute right PCA territory infarct with petechial hemorrhage and edema but no significant mass effect.  She was not a candidate for TNKase due to unclear timing of the event.  Neurology saw the patient and felt that event was due to large vessel disease.  Recommended aspirin  and Brilinta  for secondary prophylaxis for 90 days followed by aspirin  and Plavix .  Patient was up with therapy yesterday and was mod assist for sit to stand transfer.  She was able to walk 3 feet with max assist using hand-held assistance.  They reports that gait is ataxic and wide-based with ataxia worse on the left lower extremity causing a significant right lateral lean.  Patient lives at home with her daughter and son-in-law.  She was independent with a cane and walker prior to this admission.  She had been receiving some intermittent home health therapy.  Their home is 1 level with level entry.   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)    CVA (cerebral vascular accident) (HCC) 11/12/2022   Diabetes mellitus type 2, uncontrolled, 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   H/O detached retina repair    Hypertension    TIA (transient ischemic attack)    Past Surgical History:  Procedure Laterality Date   ENDARTERECTOMY Left 01/19/2014   Procedure: ENDARTERECTOMY CAROTID-LEFT;  Surgeon: Gaile LELON New, MD;  Location: [facility] OR;  Service: Vascular;  Laterality: Left;   EYE SURGERY     lens removed from right eye   PATCH ANGIOPLASTY Left 01/19/2014   Procedure: PATCH ANGIOPLASTY, LEFT CAROTID ARTERY USING HEMASHIELD PLATINUM FINESSE PATCH;  Surgeon: Gaile LELON New, MD;  Location: [facility] OR;  Service: Vascular;  Laterality: Left;   TONSILLECTOMY     Family History  Problem Relation Age of Onset   Hypertension Mother    Diabetes Mother    Hypertension Father    Social History:  reports that she quit smoking about 45 years ago. Her smoking use included cigarettes. She has never used smokeless tobacco. She reports that she does not drink alcohol  and does not use drugs. Allergies:  Allergies  Allergen Reactions   Aspirin  Nausea Only    Higher dose   Medications Prior to Admission  Medication Sig Dispense Refill   amLODipine  (NORVASC ) 10 MG tablet Take 1 tablet (10 mg total) by mouth daily. 30 tablet 0   clopidogrel  (PLAVIX ) 75 MG tablet Take 1 tablet (75 mg total) by mouth daily. 30 tablet 1   Milk Thistle 1000 MG CAPS Take 1 capsule by mouth daily.  OVER THE COUNTER MEDICATION Take 2 capsules by mouth daily. GlucoBio - 140mg  Mulberry Extract and Cinnamon     OVER THE COUNTER MEDICATION Take 2 capsules by mouth daily. Kerr-mcgee     OVER THE COUNTER MEDICATION Take 1 tablet by mouth daily. Body Magic - parsley, copper, peppermint extracts     OVER THE COUNTER MEDICATION Take 1 tablet by mouth daily. Nicotinamide Riboside + Resveratrol 900mg      valsartan  (DIOVAN ) 320 MG tablet Take 1 tablet (320 mg total) by mouth daily. 30  tablet 0   BAYER CONTOUR NEXT TEST test strip       Home: Home Living Family/patient expects to be discharged to:: Private residence Living Arrangements: Children, Other relatives (daughter and son-in-law) Available Help at Discharge: Available PRN/intermittently Type of Home: Apartment Home Access: Level entry Home Layout: One level Bathroom Shower/Tub: Engineer, Manufacturing Systems: Standard Home Equipment: Medical Laboratory Scientific Officer - single point, Medical Laboratory Scientific Officer - quad, Agricultural Consultant (2 wheels), Environmental Education Officer Comments: works as a Geologist, 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 States she was getting HHPT about 2x/week and OT sporadically ADLs Comments: Does her own meal prep when family is not there. Functional Status:  Mobility: Bed Mobility Overal bed mobility: Needs Assistance Bed Mobility: Supine to Sit Supine to sit: Min assist General bed mobility comments: Assist with bringing trunk up to sitting and hips around to the surface. Transfers Overall transfer level: Needs assistance Equipment used: 1 person hand held assist, Rolling walker (2 wheels) Transfers: Sit to/from Stand, Bed to chair/wheelchair/BSC Sit to Stand: Mod assist Bed to/from chair/wheelchair/BSC transfer type:: Step pivot Step pivot transfers: Mod assist General transfer comment: Pt with continued motor planning and proprioceptive deficits in the LLE, stating she couldn't tell she was standing on it or if it was taouching floor.  mod A to power up to standing, trial of RW initially, does better with HHA on R as pt unable to maintain grip with L hand. strong R lateral lean this session. mod A to steady to step to chair with max cues for hand placement on R Ambulation/Gait Ambulation/Gait assistance: Max assist Gait Distance (Feet): 3 Feet Assistive device: 1 person hand  held assist Gait Pattern/deviations: Step-to pattern, Decreased stance time - left, Decreased dorsiflexion - left, Decreased weight shift to left, Ataxic, Wide base of support, Drifts right/left General Gait Details: pt able to take a few steps away from EOB, very ataxic on L with max cues for standing balance as pt with strong R lateral lean Gait velocity: dec Gait velocity interpretation: <1.31 ft/sec, indicative of household ambulator Pre-gait activities: weight shift, forward/retro steps    ADL: ADL Overall ADL's : Needs assistance/impaired Eating/Feeding: Minimal assistance, Sitting Grooming: Wash/dry hands, Wash/dry face, Sitting, Minimal assistance Grooming Details (indicate cue type and reason): simulated Upper Body Bathing: Minimal assistance, Sitting Upper Body Bathing Details (indicate cue type and reason): simulated Lower Body Bathing: Moderate assistance, +2 for physical assistance, Sit to/from stand Upper Body Dressing : Moderate assistance, Sitting Lower Body Dressing: Moderate assistance, Sit to/from stand, +2 for physical assistance Toilet Transfer: +2 for physical assistance, Ambulation, Moderate assistance Toileting- Clothing Manipulation and Hygiene: Moderate assistance, +2 for physical assistance, Sit to/from stand Functional mobility during ADLs: +2 for physical assistance, Moderate assistance (functional mobility with no assistive device in the room) General ADL Comments: Pt with prioproceptive and motor  planning deficits on the left side as well as severe left hemianopsia.  Decreased ability to advance the LLE with mobility leading to left rotation with pt needing mod assist at times to place it.  Flexed pattern maintained at the left elbow with standing but able to functionally reach with the UE and attempt use in sitting.  With donning socks in sitting, pt used the RUE only and one handed technique.  Cognition: Cognition Overall Cognitive Status: History of  cognitive impairments - at baseline Arousal/Alertness: Awake/alert Orientation Level: Disoriented to time, Oriented to person, Oriented to place, Oriented to situation Attention: Sustained Sustained Attention: Appears intact Memory: Impaired Memory Impairment: Retrieval deficit Awareness: Appears intact Problem Solving: Impaired Problem Solving Impairment: Verbal basic Cognition Arousal: Alert Behavior During Therapy: WFL for tasks assessed/performed Overall Cognitive Status: History of cognitive impairments - at baseline General Comments: Pt with some delays when answering questions.  Oriented to place, month, and year.  Blood pressure (!) 141/65, pulse 65, temperature 98.1 F (36.7 C), temperature source Oral, resp. rate 18, height 5' 6 (1.676 m), weight 63.5 kg, SpO2 96%. Physical Exam Constitutional:      General: She is not in acute distress.    Appearance: She is obese.  HENT:     Head: Normocephalic.     Nose: Nose normal.  Eyes:     Comments: Left eye covered with gauze and tape. Limited vision OD  Cardiovascular:     Rate and Rhythm: Normal rate.  Pulmonary:     Effort: Pulmonary effort is normal.  Abdominal:     Palpations: Abdomen is soft.  Skin:    General: Skin is warm.  Neurological:     Comments: Pt fairly alert. Left eye covered, left HH. Unable to test acuity of remaining field.  Pt with fair awareness. Distracted. Had difficulty testing memory due to incontinent bm, irritability. Left sided weakness and apparent ataxia with movements during attempt to transfer. No obvious resting tone. Seemed to sense pain on left.     No results found for this or any previous visit (from the past 24 hours). No results found.  Assessment/Plan: Diagnosis: 88 year old female status post right PCA infarct with left hemiparesis and left homonymous hemianopsia Does the need for close, 24 hr/day medical supervision in concert with the patient's rehab needs make it unreasonable  for this patient to be served in a less intensive setting? Yes Co-Morbidities requiring supervision/potential complications:  -Peripheral artery disease -Hypertension -Type 2 diabetes -CKD 3A -Hypokalemia -hx of retinal detachment, lens removed from right eye--vision impaired at baseline Due to bladder management, bowel management, safety, skin/wound care, disease management, medication administration, pain management, and patient education, does the patient require 24 hr/day rehab nursing? Yes Does the patient require coordinated care of a physician, rehab nurse, therapy disciplines of PT, OT, SLP to address physical and functional deficits in the context of the above medical diagnosis(es)? Yes Addressing deficits in the following areas: balance, endurance, locomotion, strength, transferring, bowel/bladder control, bathing, dressing, feeding, grooming, toileting, cognition, speech, swallowing, and psychosocial support Can the patient actively participate in an intensive therapy program of at least 3 hrs of therapy per day at least 5 days per week? Yes The potential for patient to make measurable gains while on inpatient rehab is excellent Anticipated functional outcomes upon discharge from inpatient rehab are supervision and min assist  with PT, supervision and min assist with OT, modified independent and supervision with SLP. Estimated rehab length of stay to reach the  above functional goals is: 16-24 days Anticipated discharge destination: Home Overall Rehab/Functional Prognosis: good  POST ACUTE RECOMMENDATIONS: This patient's condition is appropriate for continued rehabilitative care in the following setting: CIR Patient has agreed to participate in recommended program. Yes and Potentially Note that insurance prior authorization may be required for reimbursement for recommended care.  Comment: Spoke with daughter who is supportive. Pt is a very independent and proud person, remained  active prior to this admit. Rehab Admissions Coordinator to follow up.      I have personally performed a face to face diagnostic evaluation of this patient. Additionally, I have examined the patient's medical record including any pertinent labs and radiographic images. If the physician assistant has documented in this note, I have reviewed and edited or otherwise concur with the physician assistant's documentation.  Thanks,  Arthea ONEIDA Gunther, MD 02/16/2023

## 2023-02-16 NOTE — Progress Notes (Signed)
 Occupational Therapy Treatment Patient Details Name: Kathleen Figueroa MRN: [id number] DOB: 06-May-1935 Today's Date: 02/16/2023   History of present illness 88 y.o. female presented to ED 1/2 with left sided weakness and vision deficit., possible fall. PMHx: hypertension, hyperlipidemia, diabetes, carotid artery disease, CVA.   OT comments  Pt still with extensive proprioceptive deficits in the LUE and LLE, significant left visual field cut, and left neglect.  Max assist for stand pivot transfers to and from the [facility] secondary to not being able to motor plan and receive sensory feedback of where the LLE was placed during transfer.  Provided education with family on current deficits from CVA and ways to work on progression of left visual field cut outside of therapy.  Feel she will continue to benefit from acute care OT at this time with recommendation for intensive inpatient follow up therapy, >3 hours/day post acute stay as family can provide 24 hour supervision and pt is ready for more intensive therapy and will progress well.              If plan is discharge home, recommend the following:  A lot of help with walking and/or transfers;A lot of help with bathing/dressing/bathroom;Assistance with cooking/housework;Supervision due to cognitive status;Direct supervision/assist for medications management;Assist for transportation;Direct supervision/assist for financial management;Assistance with feeding   Equipment Recommendations  Other (comment) (TBD next venue of care)       Precautions / Restrictions Precautions Precautions: Fall Precaution Comments: left hemiparesis, severe left visual field cut Restrictions Weight Bearing Restrictions Per Provider Order: No       Mobility Bed Mobility                    Transfers Overall transfer level: Needs assistance Equipment used: None Transfers: Sit to/from Stand Sit to Stand: Mod assist     Step pivot transfers: Max assist      General transfer comment: Pt with significant proprioceptive deficits and motor planning deficits, not being able to efficiently control the RLE with stepping forward or backwards.     Balance Overall balance assessment: Needs assistance Sitting-balance support: Feet supported Sitting balance-Leahy Scale: Fair     Standing balance support: Bilateral upper extremity supported Standing balance-Leahy Scale: Zero Standing balance comment: Pt needs mod assist for static standing with max assist for transfers                           ADL either performed or assessed with clinical judgement   ADL Overall ADL's : Needs assistance/impaired                     Lower Body Dressing: Moderate assistance;+2 for safety/equipment;Sit to/from stand   Toilet Transfer: Maximal assistance;Stand-pivot;BSC/3in1   Toileting- Clothing Manipulation and Hygiene: Moderate assistance;+2 for safety/equipment;Sit to/from stand       Functional mobility during ADLs: Moderate assistance;+2 for physical assistance General ADL Comments: Pt with decreased proprioception in the LUE and LLE, with decreased awareness of placement and control with taking steps with the LLE.  She reports not being able to feel that it's on the ground and when stepping back to her recliner, had it up on the foot of the recliner without awareness.  Provided education to family post toileting tasks on current limitations with left visual field loss and left neglect.  Provided insight into having her scan left of midline to locate them when in the room as well as incorporating her  bongo drum for LUE functional use.  Pt needed max instructional cueing to scan left of midlne to locate her children with therapist having to provide some physical assist to turn her head further.  She would turn the head to the right some but still lag behind with her eyes, staying left of her head.  Pt with decreased coordination with attempts to  control movement to play the drum with the LUE, Active movement is present but she is unable to grade it.  While resting in the recliner pt will hold the LLE out into knee extension without awareness.      Cognition Arousal: Alert Behavior During Therapy: WFL for tasks assessed/performed Overall Cognitive Status: Impaired/Different from baseline Area of Impairment: Attention, Safety/judgement, Awareness                   Current Attention Level: Selective, Sustained     Safety/Judgement: Decreased awareness of deficits Awareness: Intellectual                       Pertinent Vitals/ Pain       Pain Assessment Pain Assessment: Faces Faces Pain Scale: No hurt         Frequency  Min 1X/week        Progress Toward Goals  OT Goals(current goals can now be found in the care plan section)  Progress towards OT goals: Progressing toward goals  Acute Rehab OT Goals Patient Stated Goal: Pt and family want her to get better and continue to improve. OT Goal Formulation: With patient Time For Goal Achievement: 02/26/23 Potential to Achieve Goals: Good  Plan      Co-evaluation          OT goals addressed during session: ADL's and self-care      AM-PAC OT 6 Clicks Daily Activity     Outcome Measure   Help from another person eating meals?: A Little Help from another person taking care of personal grooming?: A Lot Help from another person toileting, which includes using toliet, bedpan, or urinal?: A Lot Help from another person bathing (including washing, rinsing, drying)?: A Lot Help from another person to put on and taking off regular upper body clothing?: A Lot Help from another person to put on and taking off regular lower body clothing?: A Lot 6 Click Score: 13    End of Session    OT Visit Diagnosis: Unsteadiness on feet (R26.81);Other abnormalities of gait and mobility (R26.89);Muscle weakness (generalized) (M62.81);Hemiplegia and  hemiparesis;Apraxia (R48.2);Other (comment) (visual deficits) Hemiplegia - Right/Left: Left Hemiplegia - dominant/non-dominant: Non-Dominant Hemiplegia - caused by: Cerebral infarction   Activity Tolerance Patient tolerated treatment well   Patient Left in chair;with call bell/phone within reach;with chair alarm set   Nurse Communication Mobility status        Time: 1458-1600 OT Time Calculation (min): 62 min  Charges: OT General Charges $OT Visit: 1 Visit OT Treatments $Self Care/Home Management : 23-37 mins $Therapeutic Activity: 8-22 mins $Neuromuscular Re-education: 8-22 mins  Lynwood Constant, OTR/L Acute Rehabilitation Services  Office [phone] 02/16/2023

## 2023-02-16 NOTE — Progress Notes (Signed)
 Inpatient Rehab Coordinator Note:  I met with patient at bedside to discuss CIR recommendations and goals/expectations of CIR stay.  We reviewed 3 hrs/day of therapy, physician follow up, and average length of stay 2 weeks (dependent upon progress) with goals of supervision to min assist.  I also spoke to her daughter, Tempie, on the phone and discussed the same.  Sherie confirms pt will have 24/7 care at home between herself, her spouse, and hired caregivers through her LTC policy.  I advised her to go ahead and get in touch with the LTC policy provider and let them know they want to use her benefits, in case there is a waiting period.  We reviewed need for prior auth with [facility] and I will start this request today.  Will follow.   Reche Lowers, PT, DPT Admissions Coordinator [phone] 02/16/23  1:55 PM

## 2023-02-16 NOTE — Progress Notes (Signed)
 [facility] Progress Note Patient: Kathleen Figueroa FMW:[id number] DOB: [birth year] DOA: 02/11/2023  DOS: the patient was seen and examined on 02/16/2023  Brief Hospital Course: PMH of CVA, HTN, type II DM, PAD presented to hospital with numbness and left-sided weakness and vision deficit.  Found to have acute stroke.  Neurology was consulted. Waiting for rehab.  Medically stable.  Assessment and plan. Acute ischemic infarct right PCA territory. Left homonymous hemianopsia. Right gaze preference.  Left-sided weakness. CT scan large acute/subacute right PCA infarct without any hemorrhage. CTA shows multiple occlusions. MRI shows petechial hemorrhage and edema without any mass effect. Echocardiogram shows preserved EF. LDL 193. Most likely noncompliant with medical regimen on discharge. Plavix  75 mg prior to admission.   Currently on aspirin  and Brilinta  for 90 days then aspirin  alone. PT OT recommending CIR now.   PAD. Multiple atherosclerosis seen on the CT scan. Nonsignificant that requires acute intervention. Has prior history of carotid artery intervention. Monitor.   HTN. Blood pressure stable today.   HLD. LDL 193. On Lipitor  80 mg.   Type 2 diabetes mellitus, well-controlled without any insulin .  With CKD. Currently on sliding scale insulin .  Hypokalemia. Replaced. Hypomagnesemia: Replaced .  CKD 3A.  Baseline serum creatinine around 1.3. Anion gap metabolic acidosis. Baseline creatinine around 1.3.  Creatinine 1.01. Encourage oral intake.   Subjective: Patient seen and examined.  No overnight events.  Patient tells me she feels about the same.  Waiting to go to CIR.     Physical Exam: General: Looks fairly comfortable.  On room air.  Pleasant interaction. Cardiovascular: S1-S2 normal.  Regular rate rhythm. Respiratory: Bilateral clear.  No added sounds. Gastrointestinal: Soft.  Nontender.  Bowel sound present. Ext: No swelling or edema. Neuro: Alert and  awake. Mild left-sided weakness.  Ongoing left neglect.  Left homonymous hemianopsia better with eye patch.  Data Reviewed: I have Reviewed nursing notes, Vitals, and Lab results. Since last encounter, pertinent lab results CBC and BMP   . I have ordered test including CBC and BMP  .   Disposition: Status is: Inpatient Remains inpatient appropriate because: Awaiting rehab.  heparin  injection 5,000 Units Start: 02/13/23 1400 SCD's Start: 02/11/23 1734   Family Communication: None today. Level of care: Telemetry Medical continue indefinitely Vitals:   02/15/23 2109 02/15/23 2322 02/16/23 0312 02/16/23 0749  BP: (!) 156/80 134/84 (!) 144/72 (!) 141/65  Pulse: 72 67 64 65  Resp: 16 16 16 18   Temp: 97.7 F (36.5 C) 97.6 F (36.4 C) 98.3 F (36.8 C) 98.1 F (36.7 C)  TempSrc: Oral Oral Oral Oral  SpO2: 97% 100% 97% 96%  Weight:      Height:         Author: Renato Applebaum, MD 02/16/2023 11:37 AM

## 2023-02-17 DIAGNOSIS — I639 Cerebral infarction, unspecified: Secondary | ICD-10-CM | POA: Diagnosis not present

## 2023-02-17 LAB — MAGNESIUM: Magnesium: 2 mg/dL (ref 1.7–2.4)

## 2023-02-17 NOTE — PMR Pre-admission (Signed)
 PMR Admission Coordinator Pre-Admission Assessment  Patient: Kathleen Figueroa is an 88 y.o., female MRN: [id number] DOB: 04/05/1935 Height: 5' 6 (167.6 cm) Weight: 63.5 kg              Insurance Information HMO: yes    PPO:      PCP:      IPA:      80/20:      OTHER:  PRIMARY: [employer]      Policy#: [id number]      Subscriber: pt CM Name: Duwaine SAUNDERS      Phone#: [phone] ext [phone]     Fax#: [phone] Pre-Cert#: [id number] auth for CIR from [facility] with [employer] with updates due to fax listed above on 1/15 (f/u Norvel SQUIBB ext [phone])      Employer: Benefits:  Phone #: [phone]     Name:  Eff. Date: 02/10/23     Deduct: $0      Out of Pocket Max: [phone] ($0 met)      Life Max: n/a  CIR: $399/day for days 1-7      SNF: $10/day for days 1-20 Outpatient:      Co-Pay: $25/visit Home Health: 100%      Co-Pay:  DME: 80%     Co-Pay: 20% Providers:  SECONDARY:       Policy#:       Phone#:   Artist:       Phone#:   The Engineer, Materials Information Summary" for patients in Inpatient Rehabilitation Facilities with attached "Privacy Act Statement-Health Care Records" was provided and verbally reviewed with: Patient and Family  Emergency Contact Information Contact Information     Name Relation Home Work Charlotte Daughter [phone]  [phone]      Other Contacts   None on File    Current Medical History  Patient Admitting Diagnosis: CVA   History of Present Illness: Pt is an 88 y/o female with PMH of CVA, HTN, DM, PAD who presented to [facility] on 02/11/23 with left sided weakness and visual deficits.  CT head revealed large acute/subacute right PCA infarct with hemorrhage.  CTA showed occluded proximal right PCA and occluded intradural right vertebral artery with severe left P2 stenosis and severe left vertebral artery origin stenosis.  MRI showed large acute right PCA territory infarct with petechial hemorrhage and edema but no significant  mass effect.  She was not a candidate for TNK due to unknown LKW.  Neurology consulted and felt likely due to large vessel disease, recommendations for aspirin  and brilinta  for 90 days, then aspirin  and plavix .  Therapy evaluations were completed and pt was recommended for CIR.   Complete NIHSS TOTAL: 6 Glasgow Coma Scale Score: 15  Patient's medical record from [facility] has been reviewed by the rehabilitation admission coordinator and physician.  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 No  Family History  family history includes Diabetes in her mother; Hypertension in her father and mother.  Current Medications   Current Facility-Administered Medications:    acetaminophen  (TYLENOL ) tablet 650 mg, 650 mg, Oral, Q4H PRN **OR** acetaminophen  (TYLENOL ) 160 MG/5ML solution 650 mg, 650 mg, Per Tube, Q4H PRN **OR** acetaminophen  (TYLENOL ) suppository 650 mg, 650 mg, Rectal, Q4H PRN, Melvin, Alexander B, MD   aspirin  EC tablet 81 mg, 81 mg, Oral, Daily, Franky Redia SAILOR, MD, 81 mg at 02/18/23 9182   atorvastatin  (LIPITOR ) tablet 80 mg, 80 mg, Oral, Daily, Lehner, Erin C, NP, 80 mg at 02/18/23 0817   heparin  injection 5,000 Units, 5,000 Units, Subcutaneous, Q8H, Patel, Pranav M, MD, 5,000 Units at 02/18/23 0521   senna-docusate (Senokot-S) tablet 1 tablet, 1 tablet, Oral, QHS PRN, Melvin,  Alexander B, MD   ticagrelor  (BRILINTA ) tablet 90 mg, 90 mg, Oral, BID, Lehner, Erin C, NP, 90 mg at 02/18/23 9182  Patients Current Diet:  Diet Order             Diet regular Fluid consistency: Thin  Diet effective now                  Precautions / Restrictions Precautions Precautions: Fall Precaution Comments: left hemiparesis, severe left visual field cut Restrictions Weight Bearing Restrictions Per Provider Order: No   Has the patient had 2 or more falls or a fall with injury in the past year?No  Prior Activity Level Household: household mobility with either RW or SPC depending on the day, occasional assist with ADLs, assist for IADLs, doesn't drive  Prior Functional Level Prior Function Prior Level of Function : Independent/Modified Independent Mobility Comments: Used the cane and walker sometimes since the CVA. States she was getting HHPT about 2x/week and OT sporadically ADLs Comments: Does her own meal prep when family is not there.  Self Care: Did the patient need help bathing, dressing, using the toilet or eating?  Independent  Indoor Mobility: Did the patient need assistance with walking from room to room (with or without device)? Independent  Stairs: Did the patient need assistance with internal or external stairs (with or without device)? Needed some help  Functional Cognition: Did the patient need help planning regular tasks such as shopping or remembering to take medications? Needed some help  Patient Information Are you of Hispanic, Latino/a,or Spanish origin?: A. No, not of Hispanic, Latino/a, or Spanish origin What is your race?: B. Black or African American Do you need or want an interpreter to communicate with a doctor or health care staff?: 0. No  Patient's Response To:  Health Literacy and Transportation Is the patient able to respond to health literacy and transportation needs?: Yes Health Literacy - How often do you need to have someone help  you when you read instructions, pamphlets, or other written material from your doctor or pharmacy?: Never In the past 12 months, has lack of transportation kept you from medical appointments or from getting medications?: No In the past 12 months, has lack of transportation kept you from meetings, work, or from getting things needed for daily living?: No  Journalist, Newspaper / Equipment Home Equipment: Byers - single point, Medical Laboratory Scientific Officer - quad, Agricultural Consultant (2 wheels), Shower seat  Prior Device Use: Indicate devices/aids used by the patient prior to current illness, exacerbation or injury? Walker  Current Functional Level Cognition  Arousal/Alertness: Awake/alert Overall Cognitive Status: Impaired/Different from baseline Current Attention Level: Selective, Sustained Orientation Level:  Oriented X4 Safety/Judgement: Decreased awareness of deficits General Comments: Pt with some delays when answering questions. Attention: Sustained Sustained Attention: Appears intact Memory: Impaired Memory Impairment: Retrieval deficit Awareness: Appears intact Problem Solving: Impaired Problem Solving Impairment: Verbal basic    Extremity Assessment (includes Sensation/Coordination)  Upper Extremity Assessment: LUE deficits/detail LUE Deficits / Details: Increased tone/resistance at rest with pt holding UE in flexion.  When asked she could demonstrate full ROM in all joints but with decreased smoothness and speed of movement.  Decreased ability to grade movements. LUE Sensation: decreased light touch, decreased proprioception LUE Coordination: decreased fine motor, decreased gross motor  Lower Extremity Assessment: Defer to PT evaluation LLE Deficits / Details: Strength preserved throughout. Unable to feel light touch and deep pressure throughout, with a few areas (lateral shin,  anterior knee/thigh) mildly preserved but sporadic. Impaired coordination. LLE Sensation: decreased light touch, decreased  proprioception LLE Coordination: decreased fine motor, decreased gross motor    ADLs  Overall ADL's : Needs assistance/impaired Eating/Feeding: Minimal assistance, Sitting Grooming: Wash/dry hands, Wash/dry face, Sitting, Minimal assistance Grooming Details (indicate cue type and reason): simulated Upper Body Bathing: Minimal assistance, Sitting Upper Body Bathing Details (indicate cue type and reason): simulated Lower Body Bathing: Moderate assistance, +2 for physical assistance, Sit to/from stand Upper Body Dressing : Moderate assistance, Sitting Lower Body Dressing: Moderate assistance, +2 for safety/equipment, Sit to/from stand Toilet Transfer: Maximal assistance, Stand-pivot, BSC/3in1 Toileting- Clothing Manipulation and Hygiene: Moderate assistance, +2 for safety/equipment, Sit to/from stand Functional mobility during ADLs: Moderate assistance, +2 for physical assistance General ADL Comments: Pt with decreased proprioception in the LUE and LLE, with decreased awareness of placement and control with taking steps with the LLE.  She reports not being able to feel that it's on the ground and when stepping back to her recliner, had it up on the foot of the recliner without awareness.  Provided education to family post toileting tasks on current limitations with left visual field loss and left neglect.  Provided insight into having her scan left of midline to locate them when in the room as well as incorporating her bongo drum for LUE functional use.  Pt needed max instructional cueing to scan left of midlne to locate her children with therapist having to provide some physical assist to turn her head further.  She would turn the head to the right some but still lag behind with her eyes, staying left of her head.  Pt with decreased coordination with attempts to control movement to play the drum with the LUE, Active movement is present but she is unable to grade it.  While resting in the recliner pt  will hold the LLE out into knee extension without awareness.    Mobility  Overal bed mobility: Needs Assistance Bed Mobility: Supine to Sit Supine to sit: Min assist General bed mobility comments: Assist with bringing trunk up to sitting and hips around to EOB    Transfers  Overall transfer level: Needs assistance Equipment used: None Transfers: Sit to/from Stand, Bed to chair/wheelchair/BSC Sit to Stand: Mod assist Bed to/from chair/wheelchair/BSC transfer type:: Stand pivot Stand pivot transfers: Max assist Step pivot transfers: Max assist General transfer comment: mod A to boost to stand with LLE blocked and facilitation to square hips/shoulders, significant proprioceptive deficits and motor planning deficits, not being able to efficiently control the RLE with stepping forward despite tactile input    Ambulation / Gait / Stairs / Wheelchair Mobility  Ambulation/Gait Ambulation/Gait assistance: Max Chemical Engineer (  Feet): 3 Feet Assistive device: 1 person hand held assist Gait Pattern/deviations: Step-to pattern, Decreased stance time - left, Decreased dorsiflexion - left, Decreased weight shift to left, Ataxic, Wide base of support, Drifts right/left General Gait Details: pt able to take a few steps away from EOB, very ataxic on L with max cues for standing balance as pt with strong R lateral lean Gait velocity: dec Gait velocity interpretation: <1.31 ft/sec, indicative of household ambulator Pre-gait activities: weight shift, forward/retro steps    Posture / Balance Dynamic Sitting Balance Sitting balance - Comments: CGA Balance Overall balance assessment: Needs assistance Sitting-balance support: Feet supported Sitting balance-Leahy Scale: Fair Sitting balance - Comments: CGA Standing balance support: Bilateral upper extremity supported Standing balance-Leahy Scale: Zero Standing balance comment: Pt needs mod assist for static standing with max assist for transfers     Special needs/care consideration N/a      Previous Home Environment (from acute therapy documentation) Living Arrangements: Children, Other relatives (daughter and son-in-law)  Lives With: Family Available Help at Discharge: Available PRN/intermittently Type of Home: Apartment Home Layout: One level Home Access: Level entry Bathroom Shower/Tub: Engineer, Manufacturing Systems: Standard Home Care Services: No Additional Comments: works as a Administrator for Discharge Living Setting: Patient's home, Lives with (comment) (daughter and son in law) Type of Home at Discharge: Apartment Discharge Home Layout: One level Discharge Home Access: Level entry Discharge Bathroom Shower/Tub: Tub/shower unit Discharge Bathroom Toilet: Standard Discharge Bathroom Accessibility: Yes How Accessible: Accessible via walker Does the patient have any problems obtaining your medications?: No  Social/Family/Support Systems Anticipated Caregiver: daughter, Tempie Battiest is primary contact.  See additional info for full d/c plan Anticipated Caregiver's Contact Information: [phone] Caregiver Availability: Other (Comment) (see below) Discharge Plan Discussed with Primary Caregiver: Yes Is Caregiver In Agreement with Plan?: Yes Does Caregiver/Family have Issues with Lodging/Transportation while Pt is in Rehab?: No   Goals Patient/Family Goal for Rehab: PT/OT supervision to min assist, SLP mod I to supervision Expected length of stay: 18-21 days Additional Information: Discharge plan: pt will d/c to her daughter's home where she lives with dtr/SIL.  Pt has a LTC policy and family is working on tapping into those resources to bridge the gap where they cannot be home for 24/7 care. Pt/Family Agrees to Admission and willing to participate: Yes Program Orientation Provided & Reviewed with Pt/Caregiver Including Roles  & Responsibilities: Yes Additional Information  Needs: let Sherie know on 1/7 to call LTC policy provider and let them know they plan to use those benefits.   Decrease burden of Care through IP rehab admission: n/a   Possible need for SNF placement upon discharge: Not anticipated.  Plan to discharge to previous living setting, daughter and son in law.  Plan to engage LTC policy for additional support at home.     Patient Condition: This patient's condition remains as documented in the consult dated 02/16/23, in which the Rehabilitation Physician determined and documented that the patient's condition is appropriate for intensive rehabilitative care in an inpatient rehabilitation facility. Will admit to inpatient rehab today.  Preadmission Screen Completed By:  Reche Lowers PT, DTaP with updates by Alison Heron Lot, RN, RN MSN 02/18/2023 11:58 AM ______________________________________________________________________   Discussed status with Dr. Emeline on 02/18/23 at 1159 and received approval for admission today.  Admission Coordinator: Reche Lowers PT, DTaP with updates by  Alison Heron Lot, RN MSN time [phone] Date1/9/25

## 2023-02-17 NOTE — Progress Notes (Signed)
 PROGRESS NOTE   Kathleen Figueroa  FMW:[id number]    DOB: 1935/08/04    DOA: 02/11/2023  PCP: Katina Pfeiffer, PA-C   I have briefly reviewed patients previous medical records in [facility].  Chief Complaint  Patient presents with   [facility] Course:  88 year old female with medical history significant for CVA, HTN, type II DM, PAD presented to hospital with numbness and left-sided weakness and vision deficit.  Found to have acute stroke.  Neurology was consulted.  Medically optimized for DC.  Has insurance approval for admission to CIR but no bed.  Medically cleared for admission to CIR when bed available.   Assessment & Plan:  Active Problems:   Diabetes mellitus without complication (HCC)   Hyperlipidemia   Essential hypertension   Carotid artery disease (HCC)   S/P carotid endarterectomy   Hypokalemia   History of CVA (cerebrovascular accident)   Acute CVA (cerebrovascular accident) (HCC)   Acute ischemic infarct right PCA territory. Left homonymous hemianopsia. Right gaze preference.  Left-sided weakness. CT scan large acute/subacute right PCA infarct without any hemorrhage. CTA shows multiple occlusions. MRI shows petechial hemorrhage and edema without any mass effect. Echocardiogram shows preserved EF. LDL 193. Most likely noncompliant with medical regimen on discharge. Plavix  75 mg prior to admission.   Currently on aspirin  and Brilinta  for 90 days then aspirin  alone. PT OT recommending CIR now.  DC to CIR when bed available.   PAD. Multiple atherosclerosis seen on the CT scan. None significant that requires acute intervention. Has prior history of carotid artery intervention. Monitor.   HTN. Blood pressure with some fluctuations but mostly controlled.   HLD. LDL 193.  Goal <70 On Lipitor  80 mg.   Type 2 diabetes mellitus, well-controlled without any insulin .  With CKD. A1c 6.4 indicates good outpatient control. CBGs no  longer being monitored here and not on SSI.   Hypokalemia/hypomagnesemia. Replaced.   CKD 3A.  Baseline serum creatinine around 1.3. Anion gap metabolic acidosis. Baseline creatinine around 1.3.  Creatinine 1.01. Encourage oral intake.  Body mass index is 22.6 kg/m.   DVT prophylaxis: heparin  injection 5,000 Units Start: 02/13/23 1400 SCD's Start: 02/11/23 1734     Code Status: Full Code:  Family Communication: None at bedside Disposition:  Status is: Inpatient Remains inpatient appropriate because: Awaiting CIR bed     Consultants:   Neurology Physiatry  Procedures:     Antimicrobials:      Subjective:  Poor historian.  Sitting up in eating breakfast.  Had finished some of her potatoes.  No specific complaints.  Objective:   Vitals:   02/17/23 0004 02/17/23 0444 02/17/23 0844 02/17/23 1249  BP: (!) 162/69 (!) 173/74 (!) 152/72 (!) 147/65  Pulse: 70 71 65 72  Resp: 18 18 18 18   Temp: 98 F (36.7 C) 98.5 F (36.9 C) 97.9 F (36.6 C) 98.8 F (37.1 C)  TempSrc: Oral Oral Oral Oral  SpO2:  100% 100% 100%  Weight:      Height:        General exam: Elderly female, moderately built and frail, chronically ill looking sitting up in bed eating breakfast.  No distress noted. Respiratory system: Clear to auscultation. Respiratory effort normal. Cardiovascular system: S1 & S2 heard, RRR. No JVD, murmurs, rubs, gallops or clicks. No pedal edema.  Telemetry personally reviewed: Sinus rhythm. Gastrointestinal system: Abdomen is nondistended, soft and nontender. No organomegaly or masses felt. Normal bowel sounds  heard. Central nervous system: Alert and oriented x 2. No focal neurological deficits. Extremities: Symmetric 5 x 5 power. Skin: No rashes, lesions or ulcers Psychiatry: Judgement and insight impaired. Mood & affect appropriate.     Data Reviewed:   I have personally reviewed following labs and imaging studies   CBC: Recent Labs  Lab 02/11/23 1208  02/12/23 1041  WBC 6.0 5.3  NEUTROABS 3.9 3.3  HGB 14.0 15.2*  HCT 43.2 46.5*  MCV 86.2 86.3  PLT 185 191    Basic Metabolic Panel: Recent Labs  Lab 02/11/23 1208 02/12/23 1041 02/13/23 0947 02/13/23 1851 02/14/23 0631 02/17/23 0608  NA 139 138 140 135 136  --   K 2.9* 3.2* 3.1* 3.9 3.5  --   CL 104 97* 99 99 101  --   CO2 23 25 23 22 25   --   GLUCOSE 133* 145* 100* 151* 135*  --   BUN 19 14 15 18 13   --   CREATININE 1.13* 1.22* 1.13* 1.19* 1.01*  --   CALCIUM  8.8* 9.2 8.6* 9.0 8.3*  --   MG 1.7 1.7 1.7  --  1.4* 2.0    Liver Function Tests: Recent Labs  Lab 02/11/23 1208  AST 26  ALT 11  ALKPHOS 60  BILITOT 0.8  PROT 6.6  ALBUMIN 3.5    CBG: Recent Labs  Lab 02/11/23 1209  GLUCAP 139*    Microbiology Studies:  No results found for this or any previous visit (from the past 240 hours).  Radiology Studies:  No results found.  Scheduled Meds:    aspirin  EC  81 mg Oral Daily   atorvastatin   80 mg Oral Daily   heparin  injection (subcutaneous)  5,000 Units Subcutaneous Q8H   ticagrelor   90 mg Oral BID    Continuous Infusions:     LOS: 5 days     Trenda Mar, MD,  FACP, [facility], [facility], [facility]   [facility] Advisor [place]      To contact the attending provider between 7A-7P or the covering provider during after hours 7P-7A, please log into the web site [email or web address] and access using universal [place] password for that web site. If you do not have the password, please call the hospital operator.  02/17/2023, 2:07 PM

## 2023-02-17 NOTE — Progress Notes (Signed)
 Inpatient Rehab Admissions Coordinator:   I have received insurance approval, but I do not have a bed for this patient to admit to CIR today.  Will follow for admit pending bed availability and medical clearance.  I will update the family.   Reche Lowers, PT, DPT Admissions Coordinator [phone] 02/17/23  12:49 PM

## 2023-02-17 NOTE — Plan of Care (Signed)
  Problem: Education: Goal: Knowledge of secondary prevention will improve (MUST DOCUMENT ALL) Outcome: Progressing Goal: Knowledge of patient specific risk factors will improve Kathleen Figueroa N/A or DELETE if not current risk factor) Outcome: Progressing   Problem: Ischemic Stroke/TIA Tissue Perfusion: Goal: Complications of ischemic stroke/TIA will be minimized Outcome: Progressing   Problem: Coping: Goal: Will verbalize positive feelings about self Outcome: Progressing Goal: Will identify appropriate support needs Outcome: Progressing

## 2023-02-17 NOTE — Progress Notes (Signed)
 Physical Therapy Treatment Patient Details Name: Kathleen Figueroa MRN: [id number] DOB: Oct 09, 1935 Today's Date: 02/17/2023   History of Present Illness 88 y.o. female presented to ED 1/2 with left sided weakness and vision deficit., possible fall. PMHx: hypertension, hyperlipidemia, diabetes, carotid artery disease, CVA.    PT Comments  Pt resting in bed on arrival and agreeable to session with continued progress towards acute goals. Pt continues to be limited by significantly decreased/impaired L attention, sensation and proprioception with pt needing cues throughout session to attend to L side and environment and increased assist on L throughout mobility. Pt able to come to stand with L knee blocked with mod A to boost and max A to pivot toward R to chair. Pt with poor motor planning, unable to shift weight L to step RLE during tranfers despite max multimodal cues and tactile cues to step with R. Current plan remains appropriate to address deficits and maximize functional independence and decrease caregiver burden. Pt continues to benefit from skilled PT services to progress toward functional mobility goals.     If plan is discharge home, recommend the following: Two people to help with walking and/or transfers;A lot of help with bathing/dressing/bathroom;Assistance with cooking/housework;Direct supervision/assist for medications management;Assist for transportation   Can travel by private vehicle     No  Equipment Recommendations  None recommended by PT (TBD next venue)    Recommendations for Other Services       Precautions / Restrictions Precautions Precautions: Fall Precaution Comments: left hemiparesis, severe left visual field cut Restrictions Weight Bearing Restrictions Per Provider Order: No     Mobility  Bed Mobility Overal bed mobility: Needs Assistance Bed Mobility: Supine to Sit     Supine to sit: Min assist     General bed mobility comments: Assist with bringing  trunk up to sitting and hips around to EOB    Transfers Overall transfer level: Needs assistance Equipment used: None Transfers: Sit to/from Stand, Bed to chair/wheelchair/BSC Sit to Stand: Mod assist Stand pivot transfers: Max assist         General transfer comment: mod A to boost to stand with LLE blocked and facilitation to square hips/shoulders, significant proprioceptive deficits and motor planning deficits, not being able to efficiently control the RLE with stepping forward despite tactile input    Ambulation/Gait                   Stairs             Wheelchair Mobility     Tilt Bed    Modified Rankin (Stroke Patients Only)       Balance Overall balance assessment: Needs assistance Sitting-balance support: Feet supported Sitting balance-Leahy Scale: Fair Sitting balance - Comments: CGA   Standing balance support: Bilateral upper extremity supported Standing balance-Leahy Scale: Zero Standing balance comment: Pt needs mod assist for static standing with max assist for transfers                            Cognition Arousal: Alert Behavior During Therapy: WFL for tasks assessed/performed Overall Cognitive Status: Impaired/Different from baseline Area of Impairment: Attention, Safety/judgement, Awareness                   Current Attention Level: Selective, Sustained     Safety/Judgement: Decreased awareness of deficits Awareness: Intellectual   General Comments: Pt with some delays when answering questions.        Exercises  Other Exercises Other Exercises: warm up LE exercises, LAQ and seated marching x10 on each side    General Comments        Pertinent Vitals/Pain Pain Assessment Pain Assessment: Faces Faces Pain Scale: No hurt Pain Intervention(s): Monitored during session    Home Living                          Prior Function            PT Goals (current goals can now be found in the  care plan section) Acute Rehab PT Goals Patient Stated Goal: Get well PT Goal Formulation: With patient Time For Goal Achievement: 02/26/23 Progress towards PT goals: Progressing toward goals    Frequency    Min 1X/week      PT Plan      Co-evaluation              AM-PAC PT 6 Clicks Mobility   Outcome Measure  Help needed turning from your back to your side while in a flat bed without using bedrails?: A Little Help needed moving from lying on your back to sitting on the side of a flat bed without using bedrails?: A Lot Help needed moving to and from a bed to a chair (including a wheelchair)?: A Lot Help needed standing up from a chair using your arms (e.g., wheelchair or bedside chair)?: A Lot Help needed to walk in hospital room?: Total Help needed climbing 3-5 steps with a railing? : Total 6 Click Score: 11    End of Session Equipment Utilized During Treatment: Gait belt Activity Tolerance: Patient tolerated treatment well Patient left: with call bell/phone within reach;in chair;with chair alarm set;with family/visitor present Nurse Communication: Mobility status PT Visit Diagnosis: Unsteadiness on feet (R26.81);Other abnormalities of gait and mobility (R26.89);Hemiplegia and hemiparesis;Difficulty in walking, not elsewhere classified (R26.2);Ataxic gait (R26.0) Hemiplegia - Right/Left: Left     Time: 9046-8982 PT Time Calculation (min) (ACUTE ONLY): 24 min  Charges:    $Therapeutic Activity: 23-37 mins PT General Charges $$ ACUTE PT VISIT: 1 Visit                     Shyne Lehrke R. PTA Acute Rehabilitation Services Office: [phone]   Therisa CHRISTELLA Boor 02/17/2023, 12:08 PM

## 2023-02-17 NOTE — Progress Notes (Signed)
 Inpatient Rehab Admissions Coordinator:   Awaiting determination from [facility] regarding CIR prior auth request.    Estill Dooms, PT, DPT Admissions Coordinator [phone] 02/17/23  8:26 AM

## 2023-02-18 ENCOUNTER — Inpatient Hospital Stay (HOSPITAL_COMMUNITY): Payer: Medicare HMO

## 2023-02-18 ENCOUNTER — Inpatient Hospital Stay (HOSPITAL_COMMUNITY)
Admission: AD | Admit: 2023-02-18 | Discharge: 2023-03-15 | DRG: 057 | Disposition: A | Discharge status: Skilled Nursing Facility | Payer: Medicare HMO | Source: Intra-hospital | Attending: Physical Medicine & Rehabilitation | Admitting: Physical Medicine & Rehabilitation

## 2023-02-18 ENCOUNTER — Encounter (HOSPITAL_COMMUNITY): Payer: Self-pay | Admitting: Physical Medicine & Rehabilitation

## 2023-02-18 ENCOUNTER — Other Ambulatory Visit: Payer: Self-pay

## 2023-02-18 DIAGNOSIS — Z87891 Personal history of nicotine dependence: Secondary | ICD-10-CM

## 2023-02-18 DIAGNOSIS — I69322 Dysarthria following cerebral infarction: Secondary | ICD-10-CM

## 2023-02-18 DIAGNOSIS — K5900 Constipation, unspecified: Secondary | ICD-10-CM | POA: Diagnosis not present

## 2023-02-18 DIAGNOSIS — R2689 Other abnormalities of gait and mobility: Secondary | ICD-10-CM | POA: Diagnosis not present

## 2023-02-18 DIAGNOSIS — N182 Chronic kidney disease, stage 2 (mild): Secondary | ICD-10-CM | POA: Diagnosis not present

## 2023-02-18 DIAGNOSIS — Z7902 Long term (current) use of antithrombotics/antiplatelets: Secondary | ICD-10-CM | POA: Diagnosis not present

## 2023-02-18 DIAGNOSIS — M792 Neuralgia and neuritis, unspecified: Secondary | ICD-10-CM | POA: Diagnosis not present

## 2023-02-18 DIAGNOSIS — E1165 Type 2 diabetes mellitus with hyperglycemia: Secondary | ICD-10-CM | POA: Diagnosis not present

## 2023-02-18 DIAGNOSIS — I69393 Ataxia following cerebral infarction: Secondary | ICD-10-CM

## 2023-02-18 DIAGNOSIS — Z8249 Family history of ischemic heart disease and other diseases of the circulatory system: Secondary | ICD-10-CM | POA: Diagnosis not present

## 2023-02-18 DIAGNOSIS — I63531 Cerebral infarction due to unspecified occlusion or stenosis of right posterior cerebral artery: Secondary | ICD-10-CM | POA: Diagnosis not present

## 2023-02-18 DIAGNOSIS — R531 Weakness: Secondary | ICD-10-CM | POA: Diagnosis not present

## 2023-02-18 DIAGNOSIS — Z886 Allergy status to analgesic agent status: Secondary | ICD-10-CM | POA: Diagnosis not present

## 2023-02-18 DIAGNOSIS — Z833 Family history of diabetes mellitus: Secondary | ICD-10-CM

## 2023-02-18 DIAGNOSIS — Z8673 Personal history of transient ischemic attack (TIA), and cerebral infarction without residual deficits: Secondary | ICD-10-CM | POA: Diagnosis not present

## 2023-02-18 DIAGNOSIS — I69392 Facial weakness following cerebral infarction: Secondary | ICD-10-CM

## 2023-02-18 DIAGNOSIS — I69312 Visuospatial deficit and spatial neglect following cerebral infarction: Secondary | ICD-10-CM | POA: Diagnosis not present

## 2023-02-18 DIAGNOSIS — I6621 Occlusion and stenosis of right posterior cerebral artery: Secondary | ICD-10-CM | POA: Diagnosis not present

## 2023-02-18 DIAGNOSIS — I69334 Monoplegia of upper limb following cerebral infarction affecting left non-dominant side: Principal | ICD-10-CM

## 2023-02-18 DIAGNOSIS — E1151 Type 2 diabetes mellitus with diabetic peripheral angiopathy without gangrene: Secondary | ICD-10-CM | POA: Diagnosis not present

## 2023-02-18 DIAGNOSIS — E1122 Type 2 diabetes mellitus with diabetic chronic kidney disease: Secondary | ICD-10-CM | POA: Diagnosis present

## 2023-02-18 DIAGNOSIS — G459 Transient cerebral ischemic attack, unspecified: Secondary | ICD-10-CM | POA: Diagnosis not present

## 2023-02-18 DIAGNOSIS — I63532 Cerebral infarction due to unspecified occlusion or stenosis of left posterior cerebral artery: Secondary | ICD-10-CM | POA: Diagnosis not present

## 2023-02-18 DIAGNOSIS — E119 Type 2 diabetes mellitus without complications: Secondary | ICD-10-CM

## 2023-02-18 DIAGNOSIS — Z794 Long term (current) use of insulin: Secondary | ICD-10-CM | POA: Diagnosis not present

## 2023-02-18 DIAGNOSIS — Z7401 Bed confinement status: Secondary | ICD-10-CM | POA: Diagnosis not present

## 2023-02-18 DIAGNOSIS — I10 Essential (primary) hypertension: Secondary | ICD-10-CM | POA: Diagnosis not present

## 2023-02-18 DIAGNOSIS — N3942 Incontinence without sensory awareness: Secondary | ICD-10-CM | POA: Diagnosis not present

## 2023-02-18 DIAGNOSIS — R209 Unspecified disturbances of skin sensation: Secondary | ICD-10-CM | POA: Diagnosis not present

## 2023-02-18 DIAGNOSIS — Z79899 Other long term (current) drug therapy: Secondary | ICD-10-CM | POA: Diagnosis not present

## 2023-02-18 DIAGNOSIS — R278 Other lack of coordination: Secondary | ICD-10-CM | POA: Diagnosis not present

## 2023-02-18 DIAGNOSIS — R41841 Cognitive communication deficit: Secondary | ICD-10-CM | POA: Diagnosis not present

## 2023-02-18 DIAGNOSIS — E785 Hyperlipidemia, unspecified: Secondary | ICD-10-CM | POA: Diagnosis present

## 2023-02-18 DIAGNOSIS — I129 Hypertensive chronic kidney disease with stage 1 through stage 4 chronic kidney disease, or unspecified chronic kidney disease: Secondary | ICD-10-CM | POA: Diagnosis not present

## 2023-02-18 DIAGNOSIS — S0990XA Unspecified injury of head, initial encounter: Secondary | ICD-10-CM | POA: Diagnosis not present

## 2023-02-18 DIAGNOSIS — I63331 Cerebral infarction due to thrombosis of right posterior cerebral artery: Secondary | ICD-10-CM | POA: Diagnosis not present

## 2023-02-18 DIAGNOSIS — E1169 Type 2 diabetes mellitus with other specified complication: Secondary | ICD-10-CM | POA: Diagnosis not present

## 2023-02-18 DIAGNOSIS — R1312 Dysphagia, oropharyngeal phase: Secondary | ICD-10-CM | POA: Diagnosis not present

## 2023-02-18 DIAGNOSIS — Z7985 Long-term (current) use of injectable non-insulin antidiabetic drugs: Secondary | ICD-10-CM | POA: Diagnosis not present

## 2023-02-18 DIAGNOSIS — M6281 Muscle weakness (generalized): Secondary | ICD-10-CM | POA: Diagnosis not present

## 2023-02-18 DIAGNOSIS — I639 Cerebral infarction, unspecified: Secondary | ICD-10-CM | POA: Diagnosis present

## 2023-02-18 DIAGNOSIS — K5901 Slow transit constipation: Secondary | ICD-10-CM | POA: Diagnosis not present

## 2023-02-18 DIAGNOSIS — I69398 Other sequelae of cerebral infarction: Secondary | ICD-10-CM | POA: Diagnosis not present

## 2023-02-18 MED ORDER — ONDANSETRON HCL 4 MG PO TABS: 4.0000 mg | ORAL_TABLET | Freq: Four times a day (QID) | ORAL | Status: DC | PRN

## 2023-02-18 MED ORDER — HEPARIN SODIUM (PORCINE) 5000 UNIT/ML IJ SOLN
5000.0000 [IU] | Freq: Three times a day (TID) | INTRAMUSCULAR | Status: DC
  Administered 2023-02-18 – 2023-02-24 (×17): 5000 [IU] via SUBCUTANEOUS
  Filled 2023-02-18 (×14): qty 1

## 2023-02-18 MED ORDER — ASPIRIN 81 MG PO TBEC: 81.0000 mg | DELAYED_RELEASE_TABLET | Freq: Every day | ORAL | Status: AC

## 2023-02-18 MED ORDER — ONDANSETRON HCL 4 MG/2ML IJ SOLN: 4.0000 mg | Freq: Four times a day (QID) | INTRAMUSCULAR | Status: DC | PRN

## 2023-02-18 MED ORDER — ALUM & MAG HYDROXIDE-SIMETH 200-200-20 MG/5ML PO SUSP
30.0000 mL | ORAL | Status: DC | PRN
  Filled 2023-02-18: qty 30

## 2023-02-18 MED ORDER — METHOCARBAMOL 500 MG PO TABS
500.0000 mg | ORAL_TABLET | Freq: Four times a day (QID) | ORAL | Status: DC | PRN
  Administered 2023-02-22 – 2023-03-11 (×5): 500 mg via ORAL
  Filled 2023-02-18 (×6): qty 1

## 2023-02-18 MED ORDER — GUAIFENESIN-DM 100-10 MG/5ML PO SYRP: 10.0000 mL | ORAL_SOLUTION | Freq: Four times a day (QID) | ORAL | Status: DC | PRN

## 2023-02-18 MED ORDER — FLEET ENEMA RE ENEM: 1.0000 | ENEMA | Freq: Once | RECTAL | Status: DC | PRN

## 2023-02-18 MED ORDER — TICAGRELOR 90 MG PO TABS
90.0000 mg | ORAL_TABLET | Freq: Two times a day (BID) | ORAL | Status: DC
  Administered 2023-02-18 – 2023-03-15 (×50): 90 mg via ORAL
  Filled 2023-02-18 (×50): qty 1

## 2023-02-18 MED ORDER — POLYETHYLENE GLYCOL 3350 17 G PO PACK: 17.0000 g | PACK | Freq: Every day | ORAL | Status: DC | PRN

## 2023-02-18 MED ORDER — BISACODYL 5 MG PO TBEC
5.0000 mg | DELAYED_RELEASE_TABLET | Freq: Every day | ORAL | Status: DC | PRN
  Administered 2023-02-20 – 2023-02-25 (×2): 5 mg via ORAL
  Filled 2023-02-18 (×2): qty 1

## 2023-02-18 MED ORDER — ACETAMINOPHEN 325 MG PO TABS
325.0000 mg | ORAL_TABLET | ORAL | Status: DC | PRN
  Administered 2023-02-22 – 2023-03-11 (×5): 650 mg via ORAL
  Filled 2023-02-18 (×5): qty 2

## 2023-02-18 MED ORDER — ASPIRIN 81 MG PO TBEC
81.0000 mg | DELAYED_RELEASE_TABLET | Freq: Every day | ORAL | Status: DC
Start: 2023-02-19 — End: 2023-03-15
  Administered 2023-02-19 – 2023-03-15 (×25): 81 mg via ORAL
  Filled 2023-02-18 (×25): qty 1

## 2023-02-18 MED ORDER — ATORVASTATIN CALCIUM 80 MG PO TABS: 80.0000 mg | ORAL_TABLET | Freq: Every day | ORAL | Status: AC

## 2023-02-18 MED ORDER — HEPARIN SODIUM (PORCINE) 5000 UNIT/ML IJ SOLN: 5000.0000 [IU] | Freq: Three times a day (TID) | INTRAMUSCULAR | Status: DC

## 2023-02-18 MED ORDER — MELATONIN 5 MG PO TABS
5.0000 mg | ORAL_TABLET | Freq: Every evening | ORAL | Status: DC | PRN
  Filled 2023-02-18 (×2): qty 1

## 2023-02-18 MED ORDER — TICAGRELOR 90 MG PO TABS: 90.0000 mg | ORAL_TABLET | Freq: Two times a day (BID) | ORAL | Status: AC

## 2023-02-18 MED ORDER — ATORVASTATIN CALCIUM 80 MG PO TABS
80.0000 mg | ORAL_TABLET | Freq: Every day | ORAL | Status: DC
  Administered 2023-02-19 – 2023-03-15 (×25): 80 mg via ORAL
  Filled 2023-02-18 (×25): qty 1

## 2023-02-18 NOTE — Progress Notes (Signed)
 Inpatient Rehabilitation Admissions Coordinator   I have insurance approval and CIR bed that I can admit her to today. I met with patient and her son at bedside and they are in agreement. I will contact acute team and TOC to make the arrangements.  Heron Leavell, RN, MSN Rehab Admissions Coordinator [phone] 02/18/2023 11:56 AM

## 2023-02-18 NOTE — Plan of Care (Signed)
  Problem: Education: Goal: Knowledge of disease or condition will improve Outcome: Progressing Goal: Knowledge of patient specific risk factors will improve Alonso N/A or DELETE if not current risk factor) Outcome: Progressing   Problem: Self-Care: Goal: Ability to participate in self-care as condition permits will improve Outcome: Progressing

## 2023-02-18 NOTE — Plan of Care (Signed)
  Problem: Education: Goal: Knowledge of disease or condition will improve Outcome: Progressing Goal: Knowledge of patient specific risk factors will improve Alonso N/A or DELETE if not current risk factor) Outcome: Progressing   Problem: Self-Care: Goal: Ability to participate in self-care as condition permits will improve 02/18/2023 0503 by Georjean Delene SQUIBB, RN Outcome: Progressing 02/18/2023 0308 by Georjean Delene SQUIBB, RN Outcome: Progressing   Problem: Nutrition: Goal: Risk of aspiration will decrease Outcome: Progressing   Problem: Activity: Goal: Risk for activity intolerance will decrease Outcome: Progressing

## 2023-02-18 NOTE — Discharge Instructions (Signed)

## 2023-02-18 NOTE — Plan of Care (Signed)
  Problem: Pain Management: Goal: General experience of comfort will improve Outcome: Progressing   Problem: Safety: Goal: Ability to remain free from injury will improve Outcome: Progressing

## 2023-02-18 NOTE — TOC Transition Note (Signed)
 Transition of Care [facility]) - Discharge Note   Patient Details  Name: Maribell Demeo MRN: [id number] Date of Birth: [birth year]  [facility] CM/SW Contact:  Andrez JULIANNA George, RN Phone Number: 02/18/2023, 1:19 PM   Clinical Narrative:     Pt is discharging to CIR today. CM signing off.   Final next level of care: IP Rehab Facility Barriers to Discharge: No Barriers Identified   Patient Goals and CMS Choice Patient states their goals for this hospitalization and ongoing recovery are:: To get better   Choice offered to / list presented to : Patient, Adult Children      Discharge Placement                       Discharge Plan and Services Additional resources added to the After Visit Summary for   In-house Referral: Clinical Social Work   Post Acute Care Choice: Skilled Nursing Facility                               Social Drivers of Health (SDOH) Interventions SDOH Screenings   Food Insecurity: No Food Insecurity (02/11/2023)  Housing: Low Risk  (02/11/2023)  Transportation Needs: No Transportation Needs (02/11/2023)  Utilities: Not At Risk (02/11/2023)  Social Connections: Moderately Isolated (02/11/2023)  Tobacco Use: Medium Risk (02/11/2023)     Readmission Risk Interventions     No data to display

## 2023-02-18 NOTE — Progress Notes (Signed)
 Babs Arthea DASEN, MD  Physician Physical Medicine and Rehabilitation   Consult Note    Signed   Date of Service: 02/16/2023 10:36 AM  Related encounter: ED to Hosp-Admission (Discharged) from 02/11/2023 in [place] [facility]   Signed     Expand All Collapse All  Show:Clear all [x] Written[x] Templated[] Copied  Added by: [x] Babs Arthea DASEN, MD  [] Hover for details          Physical Medicine and Rehabilitation Consult Reason for Consult: Left-sided weakness and visual deficits after stroke Referring Physician: Raenelle     HPI: Kathleen Figueroa is a 88 y.o. female with a history of prior CVA, hypertension, type 2 diabetes, peripheral artery disease who presented on 02/11/2023 with left-sided weakness and visual deficits.  CT scan of the head revealed a large acute/subacute right PCA infarct without hemorrhage.  CTA demonstrated occluded proximal right fetal PCA and occluded intradural right vertebral artery with severe left P2 stenosis and severe left vertebral artery origin stenosis.  MRI revealed large acute right PCA territory infarct with petechial hemorrhage and edema but no significant mass effect.  She was not a candidate for TNKase due to unclear timing of the event.  Neurology saw the patient and felt that event was due to large vessel disease.  Recommended aspirin  and Brilinta  for secondary prophylaxis for 90 days followed by aspirin  and Plavix .  Patient was up with therapy yesterday and was mod assist for sit to stand transfer.  She was able to walk 3 feet with max assist using hand-held assistance.  They reports that gait is ataxic and wide-based with ataxia worse on the left lower extremity causing a significant right lateral lean.  Patient lives at home with her daughter and son-in-law.  She was independent with a cane and walker prior to this admission.  She had been receiving some intermittent home health therapy.  Their home is 1 level with level entry.      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)     CVA (cerebral vascular accident) (HCC) 11/12/2022   Diabetes mellitus type 2, uncontrolled, 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   H/O detached retina repair     Hypertension     TIA (transient ischemic attack)               Past Surgical History:  Procedure Laterality Date   ENDARTERECTOMY Left 01/19/2014    Procedure: ENDARTERECTOMY CAROTID-LEFT;  Surgeon: Gaile LELON New, MD;  Location: [facility] OR;  Service: Vascular;  Laterality: Left;   EYE SURGERY        lens removed from right eye   PATCH ANGIOPLASTY Left 01/19/2014    Procedure: PATCH ANGIOPLASTY, LEFT CAROTID ARTERY USING HEMASHIELD PLATINUM FINESSE PATCH;  Surgeon: Gaile LELON New, MD;  Location: [facility] OR;  Service: Vascular;  Laterality: Left;   TONSILLECTOMY                 Family History  Problem Relation Age of Onset   Hypertension Mother     Diabetes Mother     Hypertension Father  Social History:  reports that she quit smoking about 45 years ago. Her smoking use included cigarettes. She has never used smokeless tobacco. She reports that she does not drink alcohol  and does not use drugs. Allergies:  Allergies       Allergies  Allergen Reactions   Aspirin  Nausea Only      Higher dose            Medications Prior to Admission  Medication Sig Dispense Refill   amLODipine  (NORVASC ) 10 MG tablet Take 1 tablet (10 mg total) by mouth daily. 30 tablet 0   clopidogrel  (PLAVIX ) 75 MG tablet Take 1 tablet (75 mg total) by mouth daily. 30 tablet 1   Milk Thistle 1000 MG CAPS Take 1 capsule by mouth daily.        OVER THE COUNTER MEDICATION Take 2 capsules by mouth daily. GlucoBio - 140mg  Mulberry Extract and Cinnamon       OVER THE COUNTER MEDICATION Take 2 capsules by mouth daily. Kerr-mcgee       OVER THE COUNTER MEDICATION Take 1 tablet by mouth daily. Body Magic - parsley, copper, peppermint extracts       OVER THE COUNTER MEDICATION Take 1 tablet by mouth daily. Nicotinamide Riboside + Resveratrol 900mg        valsartan  (DIOVAN ) 320 MG tablet Take 1 tablet (320 mg total) by mouth daily. 30 tablet 0   BAYER CONTOUR NEXT TEST test strip                Home: Home Living Family/patient expects to be discharged to:: Private residence Living Arrangements: Children, Other relatives (daughter and son-in-law) Available Help at Discharge: Available PRN/intermittently Type of Home: Apartment Home Access: Level entry Home Layout: One level Bathroom Shower/Tub: Engineer, Manufacturing Systems: Standard Home Equipment: Medical Laboratory Scientific Officer - single point, Medical Laboratory Scientific Officer - quad, Agricultural Consultant (2 wheels), Environmental Education Officer Comments: works as a Geologist, 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 States she was getting HHPT about 2x/week and OT sporadically ADLs Comments: Does her own meal prep when family is not there. Functional Status:  Mobility: Bed Mobility Overal bed mobility: Needs Assistance Bed Mobility: Supine to Sit Supine to sit: Min assist General bed mobility comments: Assist with bringing trunk up to sitting and hips around to the surface. Transfers Overall transfer level: Needs assistance Equipment used: 1 person hand held assist, Rolling walker (2 wheels) Transfers: Sit to/from Stand, Bed to chair/wheelchair/BSC Sit to Stand: Mod assist Bed to/from chair/wheelchair/BSC transfer type:: Step pivot Step pivot transfers: Mod assist General  transfer comment: Pt with continued motor planning and proprioceptive deficits in the LLE, stating she couldn't tell she was standing on it or if it was taouching floor.  mod A to power up to standing, trial of RW initially, does better with HHA on R as pt unable to maintain grip with L hand. strong R lateral lean this session. mod A to steady to step to chair with max cues for hand placement on R Ambulation/Gait Ambulation/Gait assistance: Max assist Gait Distance (Feet): 3 Feet Assistive device: 1 person hand held assist Gait Pattern/deviations: Step-to pattern, Decreased stance time - left, Decreased dorsiflexion - left, Decreased weight shift to left, Ataxic, Wide base of support, Drifts right/left General Gait Details: pt able to take a few steps away from EOB, very ataxic on L with max cues  for standing balance as pt with strong R lateral lean Gait velocity: dec Gait velocity interpretation: <1.31 ft/sec, indicative of household ambulator Pre-gait activities: weight shift, forward/retro steps   ADL: ADL Overall ADL's : Needs assistance/impaired Eating/Feeding: Minimal assistance, Sitting Grooming: Wash/dry hands, Wash/dry face, Sitting, Minimal assistance Grooming Details (indicate cue type and reason): simulated Upper Body Bathing: Minimal assistance, Sitting Upper Body Bathing Details (indicate cue type and reason): simulated Lower Body Bathing: Moderate assistance, +2 for physical assistance, Sit to/from stand Upper Body Dressing : Moderate assistance, Sitting Lower Body Dressing: Moderate assistance, Sit to/from stand, +2 for physical assistance Toilet Transfer: +2 for physical assistance, Ambulation, Moderate assistance Toileting- Clothing Manipulation and Hygiene: Moderate assistance, +2 for physical assistance, Sit to/from stand Functional mobility during ADLs: +2 for physical assistance, Moderate assistance (functional mobility with no assistive device in the room) General ADL  Comments: Pt with prioproceptive and motor planning deficits on the left side as well as severe left hemianopsia.  Decreased ability to advance the LLE with mobility leading to left rotation with pt needing mod assist at times to place it.  Flexed pattern maintained at the left elbow with standing but able to functionally reach with the UE and attempt use in sitting.  With donning socks in sitting, pt used the RUE only and one handed technique.   Cognition: Cognition Overall Cognitive Status: History of cognitive impairments - at baseline Arousal/Alertness: Awake/alert Orientation Level: Disoriented to time, Oriented to person, Oriented to place, Oriented to situation Attention: Sustained Sustained Attention: Appears intact Memory: Impaired Memory Impairment: Retrieval deficit Awareness: Appears intact Problem Solving: Impaired Problem Solving Impairment: Verbal basic Cognition Arousal: Alert Behavior During Therapy: WFL for tasks assessed/performed Overall Cognitive Status: History of cognitive impairments - at baseline General Comments: Pt with some delays when answering questions.  Oriented to place, month, and year.   Blood pressure (!) 141/65, pulse 65, temperature 98.1 F (36.7 C), temperature source Oral, resp. rate 18, height 5' 6 (1.676 m), weight 63.5 kg, SpO2 96%. Physical Exam Constitutional:      General: She is not in acute distress.    Appearance: She is obese.  HENT:     Head: Normocephalic.     Nose: Nose normal.  Eyes:     Comments: Left eye covered with gauze and tape. Limited vision OD  Cardiovascular:     Rate and Rhythm: Normal rate.  Pulmonary:     Effort: Pulmonary effort is normal.  Abdominal:     Palpations: Abdomen is soft.  Skin:    General: Skin is warm.  Neurological:     Comments: Pt fairly alert. Left eye covered, left HH. Unable to test acuity of remaining field.  Pt with fair awareness. Distracted. Had difficulty testing memory due to  incontinent bm, irritability. Left sided weakness and apparent ataxia with movements during attempt to transfer. No obvious resting tone. Seemed to sense pain on left.        Lab Results Last 24 Hours  No results found for this or any previous visit (from the past 24 hours).   Imaging Results (Last 48 hours)  No results found.     Assessment/Plan: Diagnosis: 88 year old female status post right PCA infarct with left hemiparesis and left homonymous hemianopsia Does the need for close, 24 hr/day medical supervision in concert with the patient's rehab needs make it unreasonable for this patient to be served in a less intensive setting? Yes Co-Morbidities requiring supervision/potential complications:  -Peripheral artery disease -Hypertension -Type  2 diabetes -CKD 3A -Hypokalemia -hx of retinal detachment, lens removed from right eye--vision impaired at baseline Due to bladder management, bowel management, safety, skin/wound care, disease management, medication administration, pain management, and patient education, does the patient require 24 hr/day rehab nursing? Yes Does the patient require coordinated care of a physician, rehab nurse, therapy disciplines of PT, OT, SLP to address physical and functional deficits in the context of the above medical diagnosis(es)? Yes Addressing deficits in the following areas: balance, endurance, locomotion, strength, transferring, bowel/bladder control, bathing, dressing, feeding, grooming, toileting, cognition, speech, swallowing, and psychosocial support Can the patient actively participate in an intensive therapy program of at least 3 hrs of therapy per day at least 5 days per week? Yes The potential for patient to make measurable gains while on inpatient rehab is excellent Anticipated functional outcomes upon discharge from inpatient rehab are supervision and min assist  with PT, supervision and min assist with OT, modified independent and supervision  with SLP. Estimated rehab length of stay to reach the above functional goals is: 16-24 days Anticipated discharge destination: Home Overall Rehab/Functional Prognosis: good   POST ACUTE RECOMMENDATIONS: This patient's condition is appropriate for continued rehabilitative care in the following setting: CIR Patient has agreed to participate in recommended program. Yes and Potentially Note that insurance prior authorization may be required for reimbursement for recommended care.   Comment: Spoke with daughter who is supportive. Pt is a very independent and proud person, remained active prior to this admit. Rehab Admissions Coordinator to follow up.         I have personally performed a face to face diagnostic evaluation of this patient. Additionally, I have examined the patient's medical record including any pertinent labs and radiographic images. If the physician assistant has documented in this note, I have reviewed and edited or otherwise concur with the physician assistant's documentation.   Thanks,   Arthea ONEIDA Gunther, MD 02/16/2023          Routing History

## 2023-02-18 NOTE — Progress Notes (Addendum)
 Emeline Joesph BROCKS, DO  Physician Physical Medicine and Rehabilitation   PMR Pre-admission    Signed   Date of Service: 02/17/2023  9:29 AM  Related encounter: ED to Hosp-Admission (Discharged) from 02/11/2023 in [facility]   Signed     Expand All Collapse All  Show:Clear all [x] Written[x] Templated[] Copied  Added by: [x] Alison Heron MATSU, RN[x] Butler Reche BRAVO, PT  [] Hover for details PMR Admission Coordinator Pre-Admission Assessment   Patient: Kathleen Figueroa is an 88 y.o., female MRN: [id number] DOB: October 29, 1935 Height: 5' 6 (167.6 cm) Weight: 63.5 kg                                                                                                                                                  Insurance Information HMO: yes    PPO:      PCP:      IPA:      80/20:      OTHER:  PRIMARY: [employer]      Policy#: [id number]      Subscriber: pt CM Name: Duwaine SAUNDERS      Phone#: [phone] ext [phone]     Fax#: [phone] Pre-Cert#: [id number] auth for CIR from [facility] with [facility] Medicare with updates due to fax listed above on 1/15 (f/u Norvel SQUIBB ext [phone])      Employer: Benefits:  Phone #: [phone]     Name:  Eff. Date: 02/10/23     Deduct: $0      Out of Pocket Max: [phone] ($0 met)      Life Max: n/a  CIR: $399/day for days 1-7      SNF: $10/day for days 1-20 Outpatient:      Co-Pay: $25/visit Home Health: 100%      Co-Pay:  DME: 80%     Co-Pay: 20% Providers:  SECONDARY:       Policy#:       Phone#:    Artist:       Phone#:    The Engineer, Materials Information Summary" for patients in Inpatient Rehabilitation Facilities with attached "Privacy Act Statement-Health Care Records" was provided and verbally reviewed with: Patient and Family   Emergency Contact Information Contact Information       Name Relation Home Work [name] Daughter [phone]   [phone]         Other Contacts   None on File       Current Medical History  Patient Admitting Diagnosis: CVA    History of Present Illness: Pt is an 88 y/o female with PMH of CVA, HTN, DM, PAD who presented to [facility] on 02/11/23 with left sided weakness and visual deficits.  CT head revealed large acute/subacute right PCA infarct with hemorrhage.  CTA showed occluded proximal right PCA and occluded intradural right vertebral artery with  severe left P2 stenosis and severe left vertebral artery origin stenosis.  MRI showed large acute right PCA territory infarct with petechial hemorrhage and edema but no significant mass effect.  She was not a candidate for TNK due to unknown LKW.  Neurology consulted and felt likely due to large vessel disease, recommendations for aspirin  and brilinta  for 90 days, then aspirin  and plavix .  Therapy evaluations were completed and pt was recommended for CIR.    Complete NIHSS TOTAL: 6 Glasgow Coma Scale Score: 15   Patient's medical record from [facility] has been reviewed by the rehabilitation admission coordinator and physician.   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 No   Family History  family history includes Diabetes in her mother; Hypertension in her father and mother.   Current Medications   Current Medications    Current Facility-Administered Medications:    acetaminophen   (TYLENOL ) tablet 650 mg, 650 mg, Oral, Q4H PRN **OR** acetaminophen  (TYLENOL ) 160 MG/5ML solution 650 mg, 650 mg, Per Tube, Q4H PRN **OR** acetaminophen  (TYLENOL ) suppository 650 mg, 650 mg, Rectal, Q4H PRN, Melvin, Alexander B, MD   aspirin  EC tablet 81 mg, 81 mg, Oral, Daily, Franky Redia SAILOR, MD, 81 mg at 02/18/23 9182   atorvastatin  (LIPITOR ) tablet 80 mg, 80 mg, Oral, Daily, Lehner, Erin C, NP, 80 mg at 02/18/23 0817   heparin  injection 5,000 Units, 5,000 Units, Subcutaneous, Q8H, Patel, Pranav M, MD, 5,000 Units at 02/18/23 0521   senna-docusate (Senokot-S) tablet 1 tablet, 1 tablet, Oral, QHS PRN, Melvin, Alexander B, MD   ticagrelor  (BRILINTA ) tablet 90 mg, 90 mg, Oral, BID, Lehner, Erin C, NP, 90 mg at 02/18/23 9182     Patients Current Diet:  Diet Order                  Diet regular Fluid consistency: Thin  Diet effective now                       Precautions / Restrictions Precautions Precautions: Fall Precaution Comments: left hemiparesis, severe left visual field cut Restrictions Weight Bearing Restrictions Per Provider Order: No    Has the patient had 2 or more falls or a fall with injury in the past year?No   Prior Activity Level Household: household mobility with either RW or SPC depending on the day, occasional assist with ADLs, assist for IADLs, doesn't drive   Prior Functional Level Prior Function Prior Level of Function : Independent/Modified Independent Mobility Comments: Used the cane and walker sometimes since the CVA. States she was getting HHPT about 2x/week and OT sporadically ADLs Comments: Does her own meal prep when family is not there.   Self Care: Did the patient need help bathing, dressing, using the toilet or eating?  Independent   Indoor Mobility: Did the patient need assistance with walking from room to room (with or without device)? Independent   Stairs: Did the patient need assistance with internal or external stairs (with or without  device)? Needed some help   Functional Cognition: Did the patient need help planning regular tasks such as shopping or remembering to take medications? Needed some help   Patient Information Are you of Hispanic, Latino/a,or Spanish origin?: A. No, not of Hispanic, Latino/a, or Spanish origin What is your race?: B. Black or African American Do you need or want an interpreter to communicate with a doctor or health care staff?: 0. No   Patient's Response To:  Health Literacy and Transportation Is the patient able to respond to health literacy and transportation needs?: Yes Health Literacy - How often do you need to have someone help you when you read instructions, pamphlets, or other written material from your doctor or pharmacy?: Never In the past 12 months, has lack of transportation kept you from medical appointments or from getting medications?: No In the past 12 months, has lack of transportation kept you from meetings, work, or from getting things needed for daily living?: No   Journalist, Newspaper / Equipment Home Equipment: [place] - single point, Medical Laboratory Scientific Officer - quad, Agricultural Consultant (2 wheels), Shower seat   Prior Device Use: Indicate devices/aids used by the patient prior to current illness, exacerbation or injury? Walker   Current Functional Level Cognition   Arousal/Alertness: Awake/alert Overall Cognitive Status: Impaired/Different from baseline Current Attention Level: Selective, Sustained Orientation Level: Oriented X4 Safety/Judgement: Decreased awareness of deficits General Comments: Pt with some delays when answering questions. Attention: Sustained Sustained Attention: Appears intact Memory: Impaired Memory Impairment: Retrieval deficit Awareness: Appears intact Problem Solving: Impaired Problem Solving Impairment: Verbal basic    Extremity Assessment (includes Sensation/Coordination)   Upper Extremity Assessment: LUE deficits/detail LUE Deficits / Details: Increased  tone/resistance at rest with pt holding UE in flexion.  When asked she could demonstrate full ROM in all joints but with decreased smoothness and speed of movement.  Decreased ability to grade movements. LUE Sensation: decreased light touch, decreased proprioception LUE Coordination: decreased fine motor, decreased gross motor  Lower Extremity Assessment: Defer to PT evaluation LLE Deficits / Details: Strength preserved throughout. Unable to feel light touch and deep pressure throughout, with a few areas (lateral shin,  anterior knee/thigh) mildly preserved but sporadic. Impaired coordination. LLE Sensation: decreased light touch, decreased proprioception LLE Coordination: decreased fine motor, decreased gross motor     ADLs   Overall ADL's : Needs assistance/impaired Eating/Feeding: Minimal assistance, Sitting Grooming: Wash/dry hands, Wash/dry face, Sitting, Minimal assistance Grooming Details (indicate cue type and reason): simulated Upper Body Bathing: Minimal assistance, Sitting Upper Body Bathing Details (indicate cue type and reason): simulated Lower Body Bathing: Moderate assistance, +2 for physical assistance, Sit to/from stand Upper Body Dressing : Moderate assistance, Sitting Lower Body Dressing: Moderate assistance, +2 for safety/equipment, Sit to/from stand Toilet Transfer: Maximal assistance, Stand-pivot, BSC/3in1 Toileting- Clothing Manipulation and Hygiene: Moderate assistance, +2 for safety/equipment, Sit to/from stand Functional mobility during ADLs: Moderate assistance, +2 for physical assistance General ADL Comments: Pt with decreased proprioception in the LUE and LLE, with decreased awareness of placement and control with taking steps with the LLE.  She reports not being able to feel that it's on the ground and when stepping back to her recliner, had it up on the foot of the recliner without awareness.  Provided education to family post  toileting tasks on current limitations  with left visual field loss and left neglect.  Provided insight into having her scan left of midline to locate them when in the room as well as incorporating her bongo drum for LUE functional use.  Pt needed max instructional cueing to scan left of midlne to locate her children with therapist having to provide some physical assist to turn her head further.  She would turn the head to the right some but still lag behind with her eyes, staying left of her head.  Pt with decreased coordination with attempts to control movement to play the drum with the LUE, Active movement is present but she is unable to grade it.  While resting in the recliner pt will hold the LLE out into knee extension without awareness.     Mobility   Overal bed mobility: Needs Assistance Bed Mobility: Supine to Sit Supine to sit: Min assist General bed mobility comments: Assist with bringing trunk up to sitting and hips around to EOB     Transfers   Overall transfer level: Needs assistance Equipment used: None Transfers: Sit to/from Stand, Bed to chair/wheelchair/BSC Sit to Stand: Mod assist Bed to/from chair/wheelchair/BSC transfer type:: Stand pivot Stand pivot transfers: Max assist Step pivot transfers: Max assist General transfer comment: mod A to boost to stand with LLE blocked and facilitation to square hips/shoulders, significant proprioceptive deficits and motor planning deficits, not being able to efficiently control the RLE with stepping forward despite tactile input     Ambulation / Gait / Stairs / Wheelchair Mobility   Ambulation/Gait Ambulation/Gait assistance: Max Chemical Engineer (Feet): 3 Feet Assistive device: 1 person hand held assist Gait Pattern/deviations: Step-to pattern, Decreased stance time - left, Decreased dorsiflexion - left, Decreased weight shift to left, Ataxic, Wide base of support, Drifts right/left General Gait Details: pt able to take a few steps away from EOB, very ataxic on L with  max cues for standing balance as pt with strong R lateral lean Gait velocity: dec Gait velocity interpretation: <1.31 ft/sec, indicative of household ambulator Pre-gait activities: weight shift, forward/retro steps     Posture / Balance Dynamic Sitting Balance Sitting balance - Comments: CGA Balance Overall balance assessment: Needs assistance Sitting-balance support: Feet supported Sitting balance-Leahy Scale: Fair Sitting balance - Comments: CGA Standing balance support: Bilateral upper extremity supported Standing balance-Leahy Scale: Zero Standing balance comment: Pt needs mod assist for static standing with max assist for transfers     Special needs/care consideration N/a         Previous Home Environment (from acute therapy documentation) Living Arrangements: Children, Other relatives (daughter and son-in-law)  Lives With: Family Available Help at Discharge: Available PRN/intermittently Type of Home: Apartment Home Layout: One level Home Access: Level entry Bathroom Shower/Tub: Engineer, Manufacturing Systems: Standard Home Care Services: No Additional Comments: works as a Licensed Conveyancer for Discharge Living Setting: Patient's home, Lives with (comment) (daughter and son in law) Type of Home at Discharge: Apartment Discharge Home Layout: One level Discharge Home Access: Level entry Discharge Bathroom Shower/Tub: Tub/shower unit Discharge Bathroom Toilet: Standard Discharge Bathroom Accessibility: Yes How Accessible: Accessible via walker Does the patient have any problems obtaining your medications?: No   Social/Family/Support Systems Anticipated Caregiver: daughter, Tempie Battiest is primary contact.  See additional info for full d/c plan Anticipated Caregiver's Contact Information: [phone] Caregiver Availability: Other (Comment) (see below) Discharge Plan Discussed with Primary Caregiver: Yes Is Caregiver In Agreement  with Plan?: Yes Does Caregiver/Family have Issues with Lodging/Transportation while Pt is in Rehab?: No     Goals Patient/Family Goal for Rehab: PT/OT supervision to min assist, SLP mod I to supervision Expected length of stay: 18-21 days Additional Information: Discharge plan: pt will d/c to her daughter's home where she lives with dtr/SIL.  Pt has a LTC policy and family is working on tapping into those resources to bridge the gap where they cannot be home for 24/7 care. Pt/Family Agrees to Admission and willing to participate: Yes Program Orientation Provided & Reviewed with Pt/Caregiver Including Roles  & Responsibilities: Yes Additional Information Needs: let Sherie know on 1/7 to call LTC policy provider and let them know they plan to use those benefits.     Decrease burden of Care through IP rehab admission: n/a     Possible need for SNF placement upon discharge: Not anticipated.  Plan to discharge to previous living setting, daughter and son in law.  Plan to engage LTC policy for additional support at home.       Patient Condition: This patient's condition remains as documented in the consult dated 02/16/23, in which the Rehabilitation Physician determined and documented that the patient's condition is appropriate for intensive rehabilitative care in an inpatient rehabilitation facility. Will admit to inpatient rehab today.   Preadmission Screen Completed By:  Reche Lowers PT, DTP with updates by Alison Heron Lot, RN, RN MSN 02/18/2023 11:58 AM ______________________________________________________________________   Discussed status with Dr. Emeline on 02/18/23 at 1159 and received approval for admission today.   Admission Coordinator: Reche Lowers PT, DTP with updates by  Alison Heron Lot, RN MSN time [phone] Date1/9/25            Revision History

## 2023-02-18 NOTE — Progress Notes (Signed)
 Patient discharged to CIR =- 4 west room 3. Report called to Faby, RN assuming care of patient. All personal belongings sent with patient.

## 2023-02-18 NOTE — Discharge Summary (Addendum)
 Physician Discharge Summary  Kathleen Figueroa FMW:[id number] DOB: 11/08/1935  PCP: Katina Pfeiffer, PA-C  Admitted from: Home Discharged to: CIR  Admit date: 02/11/2023 Discharge date: 02/18/2023  Recommendations for Outpatient Follow-up:    Follow-up Information     Katina Pfeiffer, PA-C. Schedule an appointment as soon as possible for a visit.   Specialty: Family Medicine Why: Upon discharge from [facility] Contact information: [address] [place] [address] [phone]         MD at CIR Follow up.   Why: Patient will need prescriptions for aspirin , Brilinta  and statins at time of discharge from CIR.  Also recommend repeating CMP in a week's time from hospital discharge.                 Home Health: None    Equipment/Devices: TBD at [facility]    Discharge Condition: Improved and stable.   Code Status: Full Code Diet recommendation:  Discharge Diet Orders (From admission, onward)     Start     Ordered   02/18/23 0000  Diet - low sodium heart healthy        02/18/23 1241             Discharge Diagnoses:  Active Problems:   Diabetes mellitus without complication (HCC)   Hyperlipidemia   Essential hypertension   Carotid artery disease (HCC)   S/P carotid endarterectomy   Hypokalemia   History of CVA (cerebrovascular accident)   Acute CVA (cerebrovascular accident) [facility])    Brief Hospital Course:  88 year old female with medical history significant for CVA, HTN, type II DM, PAD presented to hospital with numbness and left-sided weakness and vision deficit.  Found to have acute stroke.  Neurology was consulted.  Medically optimized for DC to CIR.     Assessment & Plan:    Acute ischemic infarct right PCA territory. Left homonymous hemianopsia. Right gaze preference.  Left-sided weakness. CT scan large acute/subacute right PCA infarct without any hemorrhage. CTA shows multiple occlusions (as an detailed report below). MRI brain shows large  acute/confluent right PCA territory infarct and petechial hemorrhage and edema without any mass effect. Echocardiogram shows preserved EF. LDL 193.  A1c 6.5. Most likely noncompliant with medical regimen on discharge. Plavix  75 mg prior to admission.   Currently on aspirin  and Brilinta  for 90 days then aspirin  alone indefinitely. PT OT recommended CIR and being discharged to CIR. As per neurology signed off on 1/3, stroke etiology likely large vessel disease.  Stroke MD recommends dual antiplatelet therapy of aspirin  and Brilinta  for 3 months if she can afford it otherwise aspirin  and Plavix  and statin followed by aspirin  alone. Outpatient follow-up with neurology in 2 months.   History of stroke/TIA 04/29/2018 and 01/28/2014   PAD. Multiple atherosclerosis seen on the CT scan. None significant that requires acute intervention. Has prior history of carotid artery intervention. Monitor.   HTN. Blood pressure starting to trend up.  Resumed home dose of amlodipine  10 Mg daily at discharge but continue to hold ARB with close monitoring at CIR and as outpatient and if blood pressures continue to increase despite amlodipine , can consider restarting ARB with close monitoring of renal functions.   HLD. LDL 193.  Goal <70 On Lipitor  80 mg.  Monitor LFTs periodically.   Type 2 diabetes mellitus, well-controlled without any insulin  or meds.  With CKD. A1c 6.4 indicates good outpatient control. CBGs no longer being monitored here and not on SSI.   Hypokalemia/hypomagnesemia. Replaced.   CKD  3A.  Baseline serum creatinine around 1.3. Anion gap metabolic acidosis. Baseline creatinine around 1.3.  Creatinine 1.01. Encourage oral intake.  Abnormal Left Shoulder x-ray 1/3: showed small focus of ill-defined radiodensity projecting over the lateral left upper lung, which may represent a pulmonary nodule, radiology recommended further evaluation with dedicated chest x-ray which has not been done  yet.  Will order now and CIR MD to kindly follow at CIR.   Body mass index is 22.6 kg/m.     Consultants:   Neurology Physiatry   Procedures:      Discharge Instructions  Discharge Instructions     Ambulatory referral to Neurology   Complete by: As directed    An appointment is requested in approximately: 8 weeks   Call MD for:   Complete by: As directed    Recurrent strokelike symptoms.   Diet - low sodium heart healthy   Complete by: As directed    Increase activity slowly   Complete by: As directed         Medication List     STOP taking these medications    clopidogrel  75 MG tablet Commonly known as: PLAVIX    valsartan  320 MG tablet Commonly known as: DIOVAN        TAKE these medications    amLODipine  10 MG tablet Commonly known as: NORVASC  Take 1 tablet (10 mg total) by mouth daily.   aspirin  EC 81 MG tablet Take 1 tablet (81 mg total) by mouth daily. Swallow whole. Start taking on: February 19, 2023   atorvastatin  80 MG tablet Commonly known as: LIPITOR  Take 1 tablet (80 mg total) by mouth daily. Start taking on: February 19, 2023   Bayer Contour Next Test test strip Generic drug: glucose blood   Milk Thistle 1000 MG Caps Take 1 capsule by mouth daily.   OVER THE COUNTER MEDICATION Take 2 capsules by mouth daily. GlucoBio - 140mg  Mulberry Extract and Cinnamon   OVER THE COUNTER MEDICATION Take 2 capsules by mouth daily. Kerr-mcgee   OVER THE COUNTER MEDICATION Take 1 tablet by mouth daily. Body Magic - parsley, copper, peppermint extracts   OVER THE COUNTER MEDICATION Take 1 tablet by mouth daily. Nicotinamide Riboside + Resveratrol 900mg    ticagrelor  90 MG Tabs tablet Commonly known as: BRILINTA  Take 1 tablet (90 mg total) by mouth 2 (two) times daily.       Allergies  Allergen Reactions   Aspirin  Nausea Only    Higher dose      Procedures/Studies: DG Shoulder Left Result Date: 02/12/2023 CLINICAL DATA:  Status post  fall with left shoulder pain EXAM: LEFT SHOULDER - 3 VIEW COMPARISON:  None Available. FINDINGS: There is no evidence of fracture or dislocation. Degenerative changes of the left glenohumeral joint. Surgical clips project over the left submandibular region. Small focus of ill-defined radiodensity projecting over the lateral left upper lung. IMPRESSION: 1. No acute fracture or dislocation. 2. Small focus of ill-defined radiodensity projecting over the lateral left upper lung, which may represent a pulmonary nodule. Recommend further evaluation with dedicated chest radiograph. Electronically Signed   By: Limin  Xu M.D.   On: 02/12/2023 09:41   MR BRAIN WO CONTRAST Result Date: 02/11/2023 CLINICAL DATA:  Stroke, follow up EXAM: MRI HEAD WITHOUT CONTRAST TECHNIQUE: Multiplanar, multiecho pulse sequences of the brain and surrounding structures were obtained without intravenous contrast. COMPARISON:  Same day CT head. FINDINGS: Brain: Large acute/confluent right PCA territory infarct which involves the right thalamus, right hippocampus and  right occipital lobe. Petechial hemorrhage. No significant mass effect or midline shift. Multiple foci of susceptibility artifact within the brainstem and bilateral thalami, compatible with chronic microhemorrhages that are likely due to chronic hypertension. No visible mass lesion. No hydrocephalus. Patchy T2/FLAIR hyperintensities in the white matter are compatible with chronic microvascular ischemic disease. Left parietal remote infarct. Vascular: Major arterial flow voids are maintained at the skull base. Skull and upper cervical spine: Normal marrow signal. Sinuses/Orbits: Clear sinuses.  No acute findings. Other: No mastoid effusions. IMPRESSION: Large acute/confluent right PCA territory infarct. Petechial hemorrhage and edema without significant mass effect. Findings discussed with Dr. Franky via telephone at 7:58 p.m. Electronically Signed   By: Gilmore GORMAN Molt M.D.    On: 02/11/2023 20:03   CT ANGIO HEAD NECK W WO CM Result Date: 02/11/2023 CLINICAL DATA:  Neuro deficit, acute, stroke suspected EXAM: CT ANGIOGRAPHY HEAD AND NECK WITH AND WITHOUT CONTRAST TECHNIQUE: Multidetector CT imaging of the head and neck was performed using the standard protocol during bolus administration of intravenous contrast. Multiplanar CT image reconstructions and MIPs were obtained to evaluate the vascular anatomy. Carotid stenosis measurements (when applicable) are obtained utilizing NASCET criteria, using the distal internal carotid diameter as the denominator. RADIATION DOSE REDUCTION: This exam was performed according to the departmental dose-optimization program which includes automated exposure control, adjustment of the mA and/or kV according to patient size and/or use of iterative reconstruction technique. CONTRAST:  65mL OMNIPAQUE  IOHEXOL  350 MG/ML SOLN COMPARISON:  Same day CT head. FINDINGS: CTA NECK FINDINGS Aortic arch: Aortic atherosclerosis. Great vessel origins are patent. Right carotid system: Atherosclerosis at the carotid bifurcation without greater than 50% stenosis. Tortuous ICA. Left carotid system: No evidence of dissection, stenosis (50% or greater), or occlusion. Vertebral arteries: Patent bilaterally. Diminished opacification of the right vertebral artery compared to the left, likely due to intradural occlusion described below. Severe left vertebral artery origin stenosis. Skeleton: No acute abnormality on limited assessment. Multilevel degenerative change. Other neck: No acute abnormality on limited assessment. Upper chest: Visi mo. 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 Otherwise, bilateral intracranial ICAs, MCAs, and ACAs are patent without proximal hemodynamically significant stenosis. Posterior circulation: Occlusion of the intradural right vertebral artery with  distal reconstitution. Left intradural vertebral artery is patent. Basilar artery is patent. Severe stenosis of the left P2 PCA. Occlusion of the fetal type right PCA proximally. Venous sinuses: Not well assessed due to arterial timing. Anatomic variants: Detailed above. Review of the MIP images confirms the above findings Provider paged at the time of dictation for call of report. IMPRESSION: 1. Occluded proximal right fetal type PCA. 2. Occluded intradural right vertebral artery. 3. Severe stenosis of the left P2 PCA. 4. Severe left vertebral artery origin stenosis. 5. Aortic Atherosclerosis (ICD10-I70.0) and Emphysema (ICD10-J43.9). Findings discussed with Dr. Franky via telephone at 7:58 p.m. Electronically Signed   By: Gilmore GORMAN Molt M.D.   On: 02/11/2023 19:59   CT CERVICAL SPINE WO CONTRAST Result Date: 02/11/2023 CLINICAL DATA:  Polytrauma, blunt; Mental status change, unknown cause. Fall. EXAM: CT HEAD WITHOUT CONTRAST CT CERVICAL SPINE WITHOUT CONTRAST TECHNIQUE: Multidetector CT imaging of the head and cervical spine was performed following the standard protocol without intravenous contrast. Multiplanar CT image reconstructions of the cervical spine were also generated. RADIATION DOSE REDUCTION: This exam was performed according to the departmental dose-optimization program which includes automated exposure control, adjustment of the mA and/or kV  according to patient size and/or use of iterative reconstruction technique. COMPARISON:  CTA head and neck 11/13/2022.  MRI head 11/12/2022. FINDINGS: CT HEAD FINDINGS Brain: There is a large acute or subacute right PCA infarct with cytotoxic edema resulting in mild mass effect on the right lateral ventricle. No intracranial hemorrhage or midline shift is evident. There is no extra-axial fluid collection or hydrocephalus. A moderate-sized chronic left parietal infarct and small chronic infarcts in the deep gray nuclei bilaterally, pons, and genu of the  corpus callosum are again noted. There is a background of extensive chronic small vessel ischemia in the cerebral white matter. There is mild ex vacuo dilatation of the left lateral ventricle. Vascular: Calcified atherosclerosis at the skull base. Skull: No acute fracture or suspicious osseous lesion. Sinuses/Orbits: No significant inflammatory changes in the included portion of the paranasal sinuses or mastoid air cells. Bilateral cataract extraction. Right scleral buckle. Other: None. CT CERVICAL SPINE FINDINGS Alignment: Straightening of the normal cervical lordosis. Unchanged grade 1 anterolisthesis of C2 on C3 and C3 on C4 and grade 1 retrolisthesis of C5 on C6. 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 No acute fracture or suspicious osseous lesion. Soft tissues and spinal canal: No prevertebral fluid or swelling. No visible canal hematoma. Disc levels: Advanced disc degeneration at C5-6 and C7-T1. Advanced multilevel facet arthrosis. Moderate multilevel neural foraminal stenosis. Possible moderate spinal stenosis at C5-6. Upper chest: Emphysema and chronic centrilobular micronodularity. Other: Atherosclerotic calcification at the right carotid bifurcation. Left carotid endarterectomy. IMPRESSION: 1. Large acute/subacute right PCA infarct. No hemorrhage. 2. No acute cervical spine fracture. 3.  Emphysema (ICD10-J43.9). Electronically Signed   By: Dasie Hamburg M.D.   On: 02/11/2023 16:33   CT HEAD WO CONTRAST ( ) Result Date: 02/11/2023 CLINICAL DATA:  Polytrauma, blunt; Mental status change, unknown cause. Fall. EXAM: CT HEAD WITHOUT CONTRAST CT CERVICAL SPINE WITHOUT CONTRAST TECHNIQUE: Multidetector CT imaging of the head and cervical spine was performed following the standard protocol without intravenous contrast. Multiplanar CT image reconstructions of the cervical spine were also generated. RADIATION DOSE REDUCTION: This exam was performed according to the departmental dose-optimization  program which includes automated exposure control, adjustment of the mA and/or kV according to patient size and/or use of iterative reconstruction technique. COMPARISON:  CTA head and neck 11/13/2022.  MRI head 11/12/2022. FINDINGS: CT HEAD FINDINGS Brain: There is a large acute or subacute right PCA infarct with cytotoxic edema resulting in mild mass effect on the right lateral ventricle. No intracranial hemorrhage or midline shift is evident. There is no extra-axial fluid collection or hydrocephalus. A moderate-sized chronic left parietal infarct and small chronic infarcts in the deep gray nuclei bilaterally, pons, and genu of the corpus callosum are again noted. There is a background of extensive chronic small vessel ischemia in the cerebral white matter. There is mild ex vacuo dilatation of the left lateral ventricle. Vascular: Calcified atherosclerosis at the skull base. Skull: No acute fracture or suspicious osseous lesion. Sinuses/Orbits: No significant inflammatory changes in the included portion of the paranasal sinuses or mastoid air cells. Bilateral cataract extraction. Right scleral buckle. Other: None. CT CERVICAL SPINE FINDINGS Alignment: Straightening of the normal cervical lordosis. Unchanged grade 1 anterolisthesis of C2 on C3 and C3 on C4 and grade 1 retrolisthesis of C5 on C6. 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 No acute fracture or suspicious osseous lesion. Soft tissues and spinal canal: No prevertebral fluid or swelling. No visible canal hematoma. Disc levels: Advanced disc degeneration at  C5-6 and C7-T1. Advanced multilevel facet arthrosis. Moderate multilevel neural foraminal stenosis. Possible moderate spinal stenosis at C5-6. Upper chest: Emphysema and chronic centrilobular micronodularity. Other: Atherosclerotic calcification at the right carotid bifurcation. Left carotid endarterectomy. IMPRESSION: 1. Large acute/subacute right PCA infarct. No hemorrhage. 2. No acute  cervical spine fracture. 3.  Emphysema (ICD10-J43.9). Electronically Signed   By: Dasie Hamburg M.D.   On: 02/11/2023 16:33      Subjective: Ongoing visual symptoms since admission.  Otherwise denies any complaints.  Discharge Exam:  Vitals:   02/17/23 2357 02/18/23 0354 02/18/23 0729 02/18/23 1233  BP: 134/86 (!) 154/78 (!) 172/91 (!) 156/61  Pulse: 76 73 69 78  Resp: 18 16 16 18   Temp: 98.9 F (37.2 C) 97.7 F (36.5 C) 98.2 F (36.8 C) 98.6 F (37 C)  TempSrc: Oral Oral Axillary Oral  SpO2: 97% 97% 97% 97%  Weight:      Height:        General exam: Elderly female, moderately built and frail, chronically ill looking lying comfortably propped up in bed without distress.  Oral mucosa moist. Respiratory system: Clear to auscultation. Respiratory effort normal. Cardiovascular system: S1 & S2 heard, RRR. No JVD, murmurs, rubs, gallops or clicks. No pedal edema.  Telemetry personally reviewed: Sinus rhythm. Gastrointestinal system: Abdomen is nondistended, soft and nontender. No organomegaly or masses felt. Normal bowel sounds heard. Central nervous system: Alert and oriented x 2.  Left homonymous hemianopsia. Extremities: Symmetric 5 x 5 power except left upper extremity with pronator drift and grade 4 x 5 power. Skin: No rashes, lesions or ulcers Psychiatry: Judgement and insight impaired. Mood & affect appropriate.     The results of significant diagnostics from this hospitalization (including imaging, microbiology, ancillary and laboratory) are listed below for reference.     Microbiology: No results found for this or any previous visit (from the past 240 hours).   Labs: CBC: Recent Labs  Lab 02/12/23 1041  WBC 5.3  NEUTROABS 3.3  HGB 15.2*  HCT 46.5*  MCV 86.3  PLT 191    Basic Metabolic Panel: Recent Labs  Lab 02/12/23 1041 02/13/23 0947 02/13/23 1851 02/14/23 0631 02/17/23 0608  NA 138 140 135 136  --   K 3.2* 3.1* 3.9 3.5  --   CL 97* 99 99 101  --    CO2 25 23 22 25   --   GLUCOSE 145* 100* 151* 135*  --   BUN 14 15 18 13   --   CREATININE 1.22* 1.13* 1.19* 1.01*  --   CALCIUM  9.2 8.6* 9.0 8.3*  --   MG 1.7 1.7  --  1.4* 2.0     Urinalysis    Component Value Date/Time   COLORURINE STRAW (A) 02/11/2023 1154   APPEARANCEUR CLEAR 02/11/2023 1154   LABSPEC 1.006 02/11/2023 1154   PHURINE 8.0 02/11/2023 1154   GLUCOSEU NEGATIVE 02/11/2023 1154   HGBUR NEGATIVE 02/11/2023 1154   BILIRUBINUR NEGATIVE 02/11/2023 1154   KETONESUR NEGATIVE 02/11/2023 1154   PROTEINUR NEGATIVE 02/11/2023 1154   UROBILINOGEN 0.2 01/18/2014 1350   NITRITE NEGATIVE 02/11/2023 1154   LEUKOCYTESUR NEGATIVE 02/11/2023 1154    Discussed with patient's daughter via phone and updated care, answered questions and DC plans  Time coordinating discharge: 35 minutes  SIGNED:  Trenda Mar, MD,  FACP, [facility], [facility], [facility]   [facility] [place]     To contact the attending provider between 7A-7P or the covering provider during after  hours 7P-7A, please log into the web site [email or web address] and access using universal [place] password for that web site. If you do not have the password, please call the hospital operator.

## 2023-02-19 ENCOUNTER — Other Ambulatory Visit (HOSPITAL_COMMUNITY): Payer: Self-pay

## 2023-02-19 ENCOUNTER — Inpatient Hospital Stay (HOSPITAL_COMMUNITY): Payer: Medicare HMO

## 2023-02-19 ENCOUNTER — Telehealth (HOSPITAL_COMMUNITY): Payer: Self-pay | Admitting: Pharmacy Technician

## 2023-02-19 DIAGNOSIS — N3942 Incontinence without sensory awareness: Secondary | ICD-10-CM

## 2023-02-19 DIAGNOSIS — I69398 Other sequelae of cerebral infarction: Secondary | ICD-10-CM | POA: Diagnosis not present

## 2023-02-19 DIAGNOSIS — I63331 Cerebral infarction due to thrombosis of right posterior cerebral artery: Secondary | ICD-10-CM

## 2023-02-19 DIAGNOSIS — I10 Essential (primary) hypertension: Secondary | ICD-10-CM | POA: Diagnosis not present

## 2023-02-19 DIAGNOSIS — R209 Unspecified disturbances of skin sensation: Secondary | ICD-10-CM

## 2023-02-19 LAB — GLUCOSE, CAPILLARY
Glucose-Capillary: 119 mg/dL — ABNORMAL HIGH (ref 70–99)
Glucose-Capillary: 122 mg/dL — ABNORMAL HIGH (ref 70–99)

## 2023-02-19 LAB — COMPREHENSIVE METABOLIC PANEL
ALT: 23 U/L (ref 0–44)
AST: 32 U/L (ref 15–41)
Albumin: 3.2 g/dL — ABNORMAL LOW (ref 3.5–5.0)
Alkaline Phosphatase: 84 U/L (ref 38–126)
Anion gap: 11 (ref 5–15)
BUN: 13 mg/dL (ref 8–23)
CO2: 27 mmol/L (ref 22–32)
Calcium: 8.9 mg/dL (ref 8.9–10.3)
Chloride: 100 mmol/L (ref 98–111)
Creatinine, Ser: 1.2 mg/dL — ABNORMAL HIGH (ref 0.44–1.00)
GFR, Estimated: 44 mL/min — ABNORMAL LOW (ref >60)
Glucose, Bld: 163 mg/dL — ABNORMAL HIGH (ref 70–99)
Potassium: 3.8 mmol/L (ref 3.5–5.1)
Sodium: 138 mmol/L (ref 135–145)
Total Bilirubin: 0.8 mg/dL (ref 0.0–1.2)
Total Protein: 6.3 g/dL — ABNORMAL LOW (ref 6.5–8.1)

## 2023-02-19 MED ORDER — AMLODIPINE BESYLATE 5 MG PO TABS
5.0000 mg | ORAL_TABLET | Freq: Once | ORAL | Status: AC
  Administered 2023-02-19: 5 mg via ORAL
  Filled 2023-02-19: qty 1

## 2023-02-19 MED ORDER — IRBESARTAN 75 MG PO TABS
150.0000 mg | ORAL_TABLET | Freq: Every day | ORAL | Status: DC
  Administered 2023-02-19 – 2023-02-24 (×6): 150 mg via ORAL
  Filled 2023-02-19 (×6): qty 2

## 2023-02-19 NOTE — Progress Notes (Signed)
 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)    CVA (cerebral vascular accident) (HCC) 11/12/2022   Diabetes mellitus type 2, uncontrolled, 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   H/O detached retina repair    Hypertension    TIA (transient ischemic attack)    Past Surgical History:  Past Surgical History:  Procedure Laterality Date   ENDARTERECTOMY Left 01/19/2014   Procedure: ENDARTERECTOMY CAROTID-LEFT;  Surgeon: Gaile LELON New, MD;  Location: [facility] OR;  Service: Vascular;  Laterality: Left;   EYE SURGERY     lens removed from right eye   PATCH ANGIOPLASTY Left 01/19/2014   Procedure: PATCH ANGIOPLASTY, LEFT CAROTID ARTERY USING HEMASHIELD PLATINUM FINESSE PATCH;  Surgeon: Gaile LELON New, MD;  Location: [facility] OR;  Service: Vascular;  Laterality: Left;   TONSILLECTOMY     Social History:  reports that she quit smoking about 45 years ago. Her smoking use included cigarettes. She has never used smokeless tobacco. She reports that she does not drink alcohol  and does not use drugs.  Family / Support Systems Marital Status: Widow/Widower Patient Roles: Parent, Other (Comment) (sibling) Children: Sheris-daughter [phone]  Christianne [phone] manages her LTC policy Anticipated Caregiver: Sherie Ability/Limitations of Caregiver: Coming up with a plan, pt does have LTC insurance and will need to look into this for coverage.  Daughter and son in-law work Medical Laboratory Scientific Officer: Other (Comment) (Coming up with a plan for Dc aware will need 24/7 care) Family Dynamics: Close with daughter, son in-law and son, all will pull together to assist pt and make sure her needs are met.  Social History Preferred language: English Religion:  Cultural Background: No issues Education: HS Health Literacy - How often do you need to have someone help you when you read instructions, pamphlets, or other written material from your doctor or pharmacy?: Never Writes: Yes Employment Status: Retired Marine Scientist Issues: No issues Guardian/Conservator: None-according to MD pt is not fully capable of making her own decisions while here. Will look toward her daughter to make any decisions while here   Abuse/Neglect Abuse/Neglect Assessment Can Be Completed: Yes Physical Abuse: Denies Verbal Abuse: Denies Sexual Abuse: Denies Exploitation of patient/patient's resources: Denies Self-Neglect: Denies  Patient response to: Social Isolation - How often do you feel lonely or isolated from those around you?: Never  Emotional Status Pt's affect, behavior and adjustment status: Pt has always been independent even after her firt stroke last oct. She lives with daughter and son in-law but they do work. She is one who will do what she can to regain her independence. Recent Psychosocial Issues: other health issues Psychiatric History: no history may benefit from seeing neuro-psych while here. Substance Abuse History: No issues  Patient / Family Perceptions, Expectations & Goals Pt/Family understanding of illness & functional limitations: Pt is aware she has had another stroke and daughter and son in-law have met with MD's involved and feel they have a good understanding of her plan moving forward. Pt  scared them last night with decline but back to baseline now Premorbid pt/family roles/activities: mom, sibling, grandmother, church  member, etc Anticipated changes in roles/activities/participation: resume Pt/family expectations/goals: Pt states:  I want to be better.  Daughter states:  We are hopeful she will do well on rehab and recover from this stroke.  Community [employer] Agencies: None Premorbid Home Care/DME Agencies: Other (Comment) (rw, and HHPT unsure if still getting it) Transportation available at discharge: family Is the patient able to respond to transportation needs?: Yes In the past 12 months, has lack of transportation kept you from medical appointments or from getting medications?: No In the past 12 months, has lack of transportation kept you from meetings, work, or from getting things needed for daily living?: No Resource referrals recommended: Neuropsychology  Discharge Planning Living Arrangements: Children, Other relatives Support Systems: Children, Other relatives, Church/faith community Type of Residence: Private residence Insurance Resources: Media [employer]anner (specify) ([employer] Medicare) Financial Resources: Tree Surgeon, Family Support Financial Screen Referred: No Living Expenses: Lives with family Money Management: Patient, Family Does the patient have any problems obtaining your medications?: No Home Management: all Patient/Family Preliminary Plans: Return home with daughter and son in-law and may need to hire caregiver with her LTC policy unsure if watiing period on this, many times there is one. Being evaluated today anf goals being set for stay here. Care Coordinator Barriers to Discharge: Decreased caregiver support, Insurance for SNF coverage Care Coordinator Anticipated Follow Up Needs: HH/OP  Clinical Impression Pleasant female who is motivated to do well. Family is involved and working on plan for care at home. Await team's evaluations and work on best plan for her.   Raymonde Asberry MATSU 02/19/2023, 9:25 AM

## 2023-02-19 NOTE — Plan of Care (Signed)
  Problem: RH Swallowing Goal: LTG Patient will consume least restrictive diet using compensatory strategies with assistance (SLP) Description: LTG:  Patient will consume least restrictive diet using compensatory strategies with assistance (SLP) Flowsheets (Taken 02/19/2023 1236) LTG: Pt Patient will consume least restrictive diet using compensatory strategies with assistance of (SLP): Supervision   Problem: RH Problem Solving Goal: LTG Patient will demonstrate problem solving for (SLP) Description: LTG:  Patient will demonstrate problem solving for basic/complex daily situations with cues  (SLP) Flowsheets (Taken 02/19/2023 1236) LTG: Patient will demonstrate problem solving for (SLP): Basic daily situations LTG Patient will demonstrate problem solving for: Supervision   Problem: RH Memory Goal: LTG Patient will use memory compensatory aids to (SLP) Description: LTG:  Patient will use memory compensatory aids to recall biographical/new, daily complex information with cues (SLP) Flowsheets (Taken 02/19/2023 1236) LTG: Patient will use memory compensatory aids to (SLP): Supervision   Problem: RH Attention Goal: LTG Patient will demonstrate this level of attention during functional activites (SLP) Description: LTG:  Patient will will demonstrate this level of attention during functional activites (SLP) Flowsheets (Taken 02/19/2023 1236) Patient will demonstrate during cognitive/linguistic activities the attention type of: Sustained LTG: Patient will demonstrate this level of attention during cognitive/linguistic activities with assistance of (SLP): Supervision

## 2023-02-19 NOTE — Progress Notes (Addendum)
 Rapid Response Nurse contacted. Charge Nurse updated as well. Provider contacted. During rounds patient waking up around 0130 this morning. 0210 rounding on patient to see if wanting to take PRN sleep medication. Endorsed sensation of not feeling right. Explains a numbness sensation. Noticed change with left side facial droop and more pronounced. Pupils changing as well. Earlier in the evening initial assessment pupils were equal. During reassessment pupils are not equal. History of left sided weakness. Initially having drift with left arm that did improve. A&O 3 to 4. Coughing after given water. Vitals obtained.

## 2023-02-19 NOTE — Discharge Instructions (Addendum)
 Inpatient Rehab Discharge Instructions  Kathleen Figueroa Discharge date and time:  03/15/2023  Activities/Precautions/ Functional Status: Activity: no lifting, driving, or strenuous exercise until cleared by MD Diet: cardiac diet>>dysphagia 3, thin liquids Wound Care: none needed Functional status:  ___ No restrictions     ___ Walk up steps independently _x__ 24/7 supervision/assistance   ___ Walk up steps with assistance ___ Intermittent supervision/assistance  ___ Bathe/dress independently ___ Walk with walker     ___ Bathe/dress with assistance ___ Walk Independently    ___ Shower independently ___ Walk with assistance    __x_ Shower with assistance _x__ No alcohol      ___ Return to work/school ________  Special Instructions: No driving, alcohol  consumption or tobacco use.   STROKE/TIA DISCHARGE INSTRUCTIONS SMOKING Cigarette smoking nearly doubles your risk of having a stroke & is the single most alterable risk factor  If you smoke or have smoked in the last 12 months, you are advised to quit smoking for your health. Most of the excess cardiovascular risk related to smoking disappears within a year of stopping. Ask you doctor about anti-smoking medications [place] Quit Line: 1-800-QUIT NOW Free Smoking Cessation Classes [phone]  CHOLESTEROL Know your levels; limit fat & cholesterol in your diet  Lipid Panel     Component Value Date/Time   CHOL 276 (H) 02/12/2023 0511   TRIG 86 02/12/2023 0511   HDL 66 02/12/2023 0511   CHOLHDL 4.2 02/12/2023 0511   VLDL 17 02/12/2023 0511   LDLCALC 193 (H) 02/12/2023 0511     Many patients benefit from treatment even if their cholesterol is at goal. Goal: Total Cholesterol (CHOL) less than 160 Goal:  Triglycerides (TRIG) less than 150 Goal:  HDL greater than 40 Goal:  LDL (LDLCALC) less than 100   BLOOD PRESSURE American Stroke Association blood pressure target is less that 120/80 mm/Hg  Your discharge blood pressure is:  BP: 135/68  Monitor your blood pressure Limit your salt and alcohol  intake Many individuals will require more than one medication for high blood pressure  DIABETES (A1c is a blood sugar average for last 3 months) Goal HGBA1c is under 7% (HBGA1c is blood sugar average for last 3 months)  Diabetes: No known diagnosis of diabetes    Lab Results  Component Value Date   HGBA1C 6.4 (H) 02/12/2023    Your HGBA1c can be lowered with medications, healthy diet, and exercise. Check your blood sugar as directed by your physician Call your physician if you experience unexplained or low blood sugars.  PHYSICAL ACTIVITY/REHABILITATION Goal is 30 minutes at least 4 days per week  Activity: Increase activity slowly, Therapies: Physical Therapy: Nursing Facility, Occupational Therapy: Nursing Facility, and Speech Therapy: Nursing Facility Return to work: n/a Activity decreases your risk of heart attack and stroke and makes your heart stronger.  It helps control your weight and blood pressure; helps you relax and can improve your mood. Participate in a regular exercise program. Talk with your doctor about the best form of exercise for you (dancing, walking, swimming, cycling).  DIET/WEIGHT Goal is to maintain a healthy weight  Your discharge diet is:  Diet Order             DIET DYS 3 Room service appropriate? Yes; Fluid consistency: Thin  Diet effective now                  thin liquids Your height is:  Height: 5' 6 (167.6 cm) Your current weight is: Weight: 64.2  kg Your Body Mass Index (BMI) is:  BMI (Calculated): 22.86 Following the type of diet specifically designed for you will help prevent another stroke. Your goal weight range is:   Your goal Body Mass Index (BMI) is 19-24. Healthy food habits can help reduce 3 risk factors for stroke:  High cholesterol, hypertension, and excess weight.  RESOURCES Stroke/Support Group:  Call [phone]   STROKE EDUCATION PROVIDED/REVIEWED AND GIVEN TO PATIENT  Stroke warning signs and symptoms How to activate emergency medical system (call 911). Medications prescribed at discharge. Need for follow-up after discharge. Personal risk factors for stroke. Pneumonia vaccine given: No Flu vaccine given: No My questions have been answered, the writing is legible, and I understand these instructions.  I will adhere to these goals & educational materials that have been provided to me after my discharge from the hospital.    My questions have been answered and I understand these instructions. I will adhere to these goals and the provided educational materials after my discharge from the hospital.  Patient/Caregiver Signature _______________________________ Date __________  Clinician Signature _______________________________________ Date __________  Please bring this form and your medication list with you to all your follow-up doctor's appointments.

## 2023-02-19 NOTE — H&P (Signed)
 Physical Medicine and Rehabilitation Admission H&P     CC: Functional deficits secondary to acute ischemic infarct right PCA territory    HPI: Kathleen Figueroa is an 88 year old female who presented to the ED on 02/11/2023 after being found down at home complaining of LUE heaviness and vision deficit. She has history of prior CVA in 2015 with MRI revealed multiple small foci acute infarcts in the left cerebral hemisphere along with chronic infarcts and moderate small vessel ischemic disease there was no major intracranial occlusion. she then underwent left carotid endarterectomy on 01/19/2014. She is maintained on aspirin  and Plavix  at home and there was some concern for the possibility she was not taking Plavix  as instructed. On arrival, CT head without contrast was concerning for a right PCA acute/subacute infarct and neurology was consulted. MRI showed large right PCA territory infarct. Now on aspirin  and Brilinta  90 mg BID for 90 days preferred. If medication not covered by insurance, then Brilinta  and aspirin  for 30 days and then continue on DAPT therapy, aspirin  and Plavix .  2D Echo LVEF 60-65% with Grade I diastolic dysfunction. LDL 193. HgbA1c 6.5. VTE prophylaxis - hep SQ. Tolerating regular diet with thin liquids. PMH: TIAs, hypertension, DM-2, PAD, bilateral carotid artery stenosis. Serum creatinine c/w mild CKD. Per PT note on 1/08: Pt continues to be limited by significantly decreased/impaired L attention, sensation and proprioception with pt needing cues throughout session to attend to L side and environment and increased assist on L throughout mobility. Pt able to come to stand with L knee blocked with mod A to boost and max A to pivot toward R to chair. Pt with poor motor planning, unable to shift weight L to step RLE during tranfers despite max multimodal cues and tactile cues to step with R. Patient lives at home with her daughter and son-in-law.  She was independent with a cane and walker prior  to this admission.  She had been receiving some intermittent home health therapy.  Their home is 1 level with level entry. The patient requires inpatient medicine and rehabilitation evaluations and services for ongoing dysfunction secondary to right PCA territory infarct.   Son and daughter at bedside. Poor appetite and daughter has been ordering her meals for her to assist with reading menu and preferences.    Review of Systems  HENT:         Frustrated with vision deficit and ability to read  Respiratory:  Negative for cough.   Cardiovascular:  Negative for chest pain.  Gastrointestinal:  Negative for abdominal pain.  Genitourinary:  Positive for urgency.       Very anxious about losing Purewick and wetting herself  Neurological:  Negative for headaches.        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)     CVA (cerebral vascular accident) (HCC) 11/12/2022   Diabetes mellitus type 2, uncontrolled, 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   H/O detached retina repair     Hypertension     TIA (transient ischemic attack)               Past Surgical History:  Procedure Laterality Date   ENDARTERECTOMY Left 01/19/2014  Procedure: ENDARTERECTOMY CAROTID-LEFT;  Surgeon: Gaile LELON New, MD;  Location: [facility] OR;  Service: Vascular;  Laterality: Left;   EYE SURGERY        lens removed from right eye   PATCH ANGIOPLASTY Left 01/19/2014    Procedure: PATCH ANGIOPLASTY, LEFT CAROTID ARTERY USING HEMASHIELD PLATINUM FINESSE PATCH;  Surgeon: Gaile LELON New, MD;  Location: [facility] OR;  Service: Vascular;  Laterality: Left;   TONSILLECTOMY                 Family History  Problem Relation Age of Onset    Hypertension Mother     Diabetes Mother     Hypertension Father          Social History:  reports that she quit smoking about 45 years ago. Her smoking use included cigarettes. She has never used smokeless tobacco. She reports that she does not drink alcohol  and does not use drugs. Allergies:  Allergies       Allergies  Allergen Reactions   Aspirin  Nausea Only      Higher dose            Medications Prior to Admission  Medication Sig Dispense Refill   amLODipine  (NORVASC ) 10 MG tablet Take 1 tablet (10 mg total) by mouth daily. 30 tablet 0   clopidogrel  (PLAVIX ) 75 MG tablet Take 1 tablet (75 mg total) by mouth daily. 30 tablet 1   Milk Thistle 1000 MG CAPS Take 1 capsule by mouth daily.       OVER THE COUNTER MEDICATION Take 2 capsules by mouth daily. GlucoBio - 140mg  Mulberry Extract and Cinnamon       OVER THE COUNTER MEDICATION Take 2 capsules by mouth daily. Kerr-mcgee       OVER THE COUNTER MEDICATION Take 1 tablet by mouth daily. Body Magic - parsley, copper, peppermint extracts       OVER THE COUNTER MEDICATION Take 1 tablet by mouth daily. Nicotinamide Riboside + Resveratrol 900mg        valsartan  (DIOVAN ) 320 MG tablet Take 1 tablet (320 mg total) by mouth daily. 30 tablet 0   BAYER CONTOUR NEXT TEST test strip                    Home: Home Living Family/patient expects to be discharged to:: Private residence Living Arrangements: Children, Other relatives (daughter and son-in-law) Available Help at Discharge: Available PRN/intermittently Type of Home: Apartment Home Access: Level entry Home Layout: One level Bathroom Shower/Tub: Engineer, Manufacturing Systems: Standard Home Equipment: Medical Laboratory Scientific Officer - single point, Medical Laboratory Scientific Officer - quad, Agricultural Consultant (2 wheels), Environmental Education Officer Comments: works as a Geologist, Engineering With: Family   Functional History: Prior Function Prior Level of Function : Independent/Modified Independent Mobility Comments: Used the cane  and walker sometimes since the CVA. States she was getting HHPT about 2x/week and OT sporadically ADLs Comments: Does her own meal prep when family is not there.   Functional Status:  Mobility: Bed Mobility Overal bed mobility: Needs Assistance Bed Mobility: Supine to Sit Supine to sit: Min assist General bed mobility comments: Assist with bringing trunk up to sitting and hips around to EOB Transfers Overall transfer level: Needs assistance Equipment used: None Transfers: Sit to/from Stand, Bed to chair/wheelchair/BSC Sit to Stand: Mod assist Bed to/from chair/wheelchair/BSC transfer type:: Stand pivot Stand pivot transfers: Max assist Step pivot transfers: Max assist General transfer comment: mod A to boost to stand with LLE blocked and facilitation  to square hips/shoulders, significant proprioceptive deficits and motor planning deficits, not being able to efficiently control the RLE with stepping forward despite tactile input Ambulation/Gait Ambulation/Gait assistance: Max assist Gait Distance (Feet): 3 Feet Assistive device: 1 person hand held assist Gait Pattern/deviations: Step-to pattern, Decreased stance time - left, Decreased dorsiflexion - left, Decreased weight shift to left, Ataxic, Wide base of support, Drifts right/left General Gait Details: pt able to take a few steps away from EOB, very ataxic on L with max cues for standing balance as pt with strong R lateral lean Gait velocity: dec Gait velocity interpretation: <1.31 ft/sec, indicative of household ambulator Pre-gait activities: weight shift, forward/retro steps   ADL: ADL Overall ADL's : Needs assistance/impaired Eating/Feeding: Minimal assistance, Sitting Grooming: Wash/dry hands, Wash/dry face, Sitting, Minimal assistance Grooming Details (indicate cue type and reason): simulated Upper Body Bathing: Minimal assistance, Sitting Upper Body Bathing Details (indicate cue type and reason): simulated Lower Body  Bathing: Moderate assistance, +2 for physical assistance, Sit to/from stand Upper Body Dressing : Moderate assistance, Sitting Lower Body Dressing: Moderate assistance, +2 for safety/equipment, Sit to/from stand Toilet Transfer: Maximal assistance, Stand-pivot, BSC/3in1 Toileting- Clothing Manipulation and Hygiene: Moderate assistance, +2 for safety/equipment, Sit to/from stand Functional mobility during ADLs: Moderate assistance, +2 for physical assistance General ADL Comments: Pt with decreased proprioception in the LUE and LLE, with decreased awareness of placement and control with taking steps with the LLE.  She reports not being able to feel that it's on the ground and when stepping back to her recliner, had it up on the foot of the recliner without awareness.  Provided education to family post toileting tasks on current limitations with left visual field loss and left neglect.  Provided insight into having her scan left of midline to locate them when in the room as well as incorporating her bongo drum for LUE functional use.  Pt needed max instructional cueing to scan left of midlne to locate her children with therapist having to provide some physical assist to turn her head further.  She would turn the head to the right some but still lag behind with her eyes, staying left of her head.  Pt with decreased coordination with attempts to control movement to play the drum with the LUE, Active movement is present but she is unable to grade it.  While resting in the recliner pt will hold the LLE out into knee extension without awareness.   Cognition: Cognition Overall Cognitive Status: Impaired/Different from baseline Arousal/Alertness: Awake/alert Orientation Level: Oriented X4 Attention: Sustained Sustained Attention: Appears intact Memory: Impaired Memory Impairment: Retrieval deficit Awareness: Appears intact Problem Solving: Impaired Problem Solving Impairment: Verbal  basic Cognition Arousal: Alert Behavior During Therapy: WFL for tasks assessed/performed Overall Cognitive Status: Impaired/Different from baseline Area of Impairment: Attention, Safety/judgement, Awareness Current Attention Level: Selective, Sustained Safety/Judgement: Decreased awareness of deficits Awareness: Intellectual General Comments: Pt with some delays when answering questions.   Physical Exam: Blood pressure (!) 172/91, pulse 69, temperature 98.2 F (36.8 C), temperature source Axillary, resp. rate 16, height 5' 6 (1.676 m), weight 63.5 kg, SpO2 97%. Physical Exam Constitutional: No apparent distress. Appropriate appearance for age.  HENT: No JVD.   Atraumatic, normocephalic. Eyes: Pupils small, but reactive. + L homonomous hemianopsia; EOMI intact except left gaze c/b vision deficit + hemineglect.  Cardiovascular: RRR, no murmurs/rub/gallops. No Edema. Peripheral pulses 2+  Respiratory: CTAB. No rales, rhonchi, or wheezing.  On RA Abdomen: + bowel sounds, normoactive. No distention or tenderness.  GU:  Not examined. purewick, draining clear urine.  Skin: C/D/I. No apparent lesions. PIV intact.   MSK:      No apparent deformity. PROM grossly intact.       Neurologic exam:  Cognition: AAO to person, place, time and event.  + L hemineglect - can overcome with stimuli + Impulsivity / poor awareness - mild Language: Fluent, No substitutions or neoglisms. Mild dysarthria. Names 3/3 objects correctly.  Memory: Recalls 1/3 objects at 5 minutes.   Insight: Poor insight into current condition.  Mood: Pleasant affect, appropriate mood.  Sensation: Absent on LUE and LLE.  Reflexes: 2+ in R UE and LE; flaccid on L. Negative Hoffman's and babinski signs bilaterally.  CN: L V1-2 sensory deficit, L shoulder shrug weakness, L vision changes as above.  Coordination: + LUE ataxia  Spasticity: MAS 0 in all extremities.       Strength:                RUE: 5/5 SA, 5/5 EF, 5/5 EE, 5/5  WE, 5/5 FF, 5/5 FA                LUE:  4/5 SA, 4/5 EF, 4/5 EE, 4/5 WE, 4/5 FF, 4/5 FA                RLE: 5/5 HF, 5/5 KE, 5/5  DF, 5/5  EHL, 5/5  PF                 LLE:  5-/5 HF, 5-/5 KE, 5-/5  DF, 5-/5  EHL, 5-/5  PF       Lab Results Last 48 Hours        Results for orders placed or performed during the hospital encounter of 02/11/23 (from the past 48 hours)  Magnesium      Status: None    Collection Time: 02/17/23  6:08 AM  Result Value Ref Range    Magnesium  2.0 1.7 - 2.4 mg/dL      Comment: Performed at [facility], [address], [place], [place] [address]      Imaging Results (Last 48 hours)  No results found.         Blood pressure (!) 172/91, pulse 69, temperature 98.2 F (36.8 C), temperature source Axillary, resp. rate 16, height 5' 6 (1.676 m), weight 63.5 kg, SpO2 97%.   Medical Problem List and Plan: 1. Functional deficits secondary to ischemic infarct right PCA territory              -patient may shower             -ELOS/Goals: 18-21 days, Supervision to Min A OT/PT/SLP   - Stable for IRF admission   2.  Antithrombotics: -DVT/anticoagulation:  Pharmaceutical: Heparin              -antiplatelet therapy: Aspirin  and Brilinta  for three 90 days followed by aspirin  alone   3. Pain Management: Tylenol  as needed   4. Mood/Behavior/Sleep: LCSW to evaluate and provide emotional support             -antipsychotic agents: n/a    - Melatonin PRN   5. Neuropsych/cognition: This patient is not quite capable of making decisions on her own behalf.   6. Skin/Wound Care: Routine skin care checks   7. Fluids/Electrolytes/Nutrition: Routine Is and Os and follow-up chemistries   8: Hypertension: monitor TID and prn (Diovan  320 mg daily at home not restarted)   9: Hyperlipidemia: continue statin  10: DM-2: A1c = 6.4% on October, 2024 (no home meds)   11: Elevated serum creatinine/GFR ~55: ? Mild CKD; now at baseline ~1.0             -follow-up BMP      Nena JINNY Buba, PA-C 02/18/2023  I have examined the patient independently and edited the note for HPI, ROS, exam, assessment, and plan as appropriate. I am in agreement with the above recommendations.   Joesph JAYSON Likes, DO 02/19/2023

## 2023-02-19 NOTE — Progress Notes (Signed)
 Received a call from [facility] LPN reporting he noticed patient was hypertensive and noticed she had a facial droop. He called rapid and she was assessed. Since arrival to rehab on 02/18/2023, her blood pressure has increased. This provider read H&P note and Neurology Consult note. Order was given for Stat CT scan without contrast. She has history with hypertension and anti hypertensive medication hasn't been resumed. Home medication list was reviewed.. she was on amlodipine  This provider spoke with pharmacist , order was placed for amlodipine . This was discussed with [facility] LPN and he verbalized understanding. Awaiting CT results  Georgette will continue to Assess.

## 2023-02-19 NOTE — Progress Notes (Signed)
 Inpatient Rehabilitation  Patient information reviewed and entered into eRehab system by Burnard Mealing, OTR/L, Rehab Quality Coordinator.   Information including medical coding, functional ability and quality indicators will be reviewed and updated through discharge.

## 2023-02-19 NOTE — Progress Notes (Addendum)
 Occupational Therapy Session Note  Patient Details  Name: Kathleen Figueroa MRN: [id number] Date of Birth: 04-11-1935  Today's Date: 02/20/2023 OT Individual Time: 9149-9066 OT Individual Time Calculation (min): 43 min    Short Term Goals: Week 1:  OT Short Term Goal 1 (Week 1): Pt will maintain sitting balance with CGA during BADLs OT Short Term Goal 2 (Week 1): Pt will locate 3/3 items in L visual field with min verbal cues OT Short Term Goal 3 (Week 1): Pt will recall hemi-dressing technique with min questioning cues OT Short Term Goal 4 (Week 1): Pt will complete functional transfers to toilet LRAD mod A  Skilled Therapeutic Interventions/Progress Updates:  Pt greeted resting in bed for skilled OT session with focus on BADL participation.   Pain: Pt with no reports of pain, although endorses feelings of LUE feeling empty. OT offering intermediate rest breaks and positioning suggestions throughout session to address pain/fatigue and maximize participation/safety in session.   Functional Transfers: Pt rolls towards R-side (for bed-level toileting) with supervision + cuing, coming to EOB with heavy Min A for guidance of BLE & RUE, as well as, elevating trunk. Stedy retrieved/used for EOB>TIS WC transfer (+2 present for safety). Static sitting balance with CGA fading to close supervision.   Self Care Tasks: Pt found to be incontinent of bladder, dependent for posterior cleaning, bathing anterior periarea with setup/cuing. Pt dependent for threading LLE into pants and hiking over bottom/hips. Min A for RLE threading. Initial setup of breakfast tray completed.   OT encouraging attention to L-side during all functional tasks, AEB standing on L-side of patient and holding/placing items for functional reach on L-side. Pt requires Mod-Max multimodal cuing and increased time to complete. Of note, pt demos overshooting of RUE; LLE extends when patient instructed to reach with LUE (unable to correct  with cuing).    Education: Daughter present and educated on L-sided perceptual deficits.   Pt remained sitting in TIS WC, NT made aware of need to provide supervision, daughter present in room, 4Ps assessed and immediate needs met. Pt continues to be appropriate for skilled OT intervention to promote further functional independence in ADLs/IADLs.   Therapy Documentation Precautions:  Precautions Precautions: Fall, Other (comment) Precaution Comments: L hemiparesis, L visual field cut, L inattention, L hemisensory loss Restrictions Weight Bearing Restrictions Per Provider Order: No   Therapy/Group: Individual Therapy  Nereida Habermann, OTR/L, MSOT  02/20/2023, 8:30 AM

## 2023-02-19 NOTE — Progress Notes (Signed)
 Orthopedic Tech Progress Note Patient Details:  Kathleen Figueroa 1935-04-21 [id number]  Called into [facility]anger Patient ID: Kathleen Figueroa, female   DOB: 1935-03-17, 88 y.o.   MRN: [id number]  Kathleen Figueroa 02/19/2023, 3:19 PM

## 2023-02-19 NOTE — Evaluation (Signed)
 Occupational Therapy Assessment and Plan  Patient Details  Name: Kathleen Figueroa MRN: [id number] Date of Birth: 1935/07/26  OT Diagnosis: abnormal posture, cognitive deficits, disturbance of vision, hemiplegia affecting non-dominant side, muscle weakness (generalized), and pain in joint Rehab Potential: Rehab Potential (ACUTE ONLY): Good ELOS: 3 weeks   Today's Date: 02/19/2023 OT Individual Time: 1045-1200 OT Individual Time Calculation (min): 75 min     Hospital Problem: Active Problems:   CVA (cerebral vascular accident) [facility])   Past Medical History:  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)    CVA (cerebral vascular accident) (HCC) 11/12/2022   Diabetes mellitus type 2, uncontrolled, 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   H/O detached retina repair    Hypertension    TIA (transient ischemic attack)    Past Surgical History:  Past Surgical History:  Procedure Laterality Date   ENDARTERECTOMY Left 01/19/2014   Procedure: ENDARTERECTOMY CAROTID-LEFT;  Surgeon: Gaile LELON New, MD;  Location: [facility] OR;  Service: Vascular;  Laterality: Left;   EYE SURGERY     lens removed from right eye   PATCH ANGIOPLASTY Left 01/19/2014   Procedure: PATCH ANGIOPLASTY, LEFT CAROTID ARTERY USING HEMASHIELD PLATINUM FINESSE PATCH;  Surgeon: Gaile LELON New, MD;  Location: [facility] OR;  Service: Vascular;  Laterality: Left;   TONSILLECTOMY      Assessment & Plan Clinical Impression: Patient is a Kathleen Figueroa is an 88 year old female who presented to the ED on 02/11/2023 after being found down at home complaining of LUE heaviness and vision deficit. She has history of prior CVA in 2015 with MRI revealed multiple  small foci acute infarcts in the left cerebral hemisphere along with chronic infarcts and moderate small vessel ischemic disease there was no major intracranial occlusion. she then underwent left carotid endarterectomy on 01/19/2014. She is maintained on aspirin  and Plavix  at home and there was some concern for the possibility she was not taking Plavix  as instructed. On arrival, CT head without contrast was concerning for a right PCA acute/subacute infarct and neurology was consulted. MRI showed large right PCA territory infarct. Now on aspirin  and Brilinta  90 mg BID for 90 days preferred. If medication not covered by insurance, then Brilinta  and aspirin  for 30 days and then continue on DAPT therapy, aspirin  and Plavix .  2D Echo LVEF 60-65% with Grade I diastolic dysfunction. LDL 193. HgbA1c 6.5. VTE prophylaxis - hep SQ. Tolerating regular diet with thin liquids. PMH: TIAs, hypertension, DM-2, PAD, bilateral carotid artery stenosis. Serum creatinine c/w mild CKD. Per PT note on 1/08: Pt continues to be limited by significantly decreased/impaired L attention, sensation and proprioception with pt needing cues throughout session to attend to L side and environment and increased assist on L throughout mobility. Pt able to come to stand with L knee blocked with mod A to boost and max A to pivot toward R to chair. Pt with poor motor planning, unable to shift weight L to step RLE during tranfers despite max multimodal cues and tactile cues to step with R. Patient lives at home with her daughter and son-in-law.  She was independent with a cane and walker prior to this admission.  She had been receiving some intermittent  home health therapy.  Their home is 1 level with level entry. Patient transferred to CIR on 02/18/2023 .    Patient currently requires max with basic self-care skills secondary to muscle weakness, decreased cardiorespiratoy endurance, impaired timing and sequencing, abnormal tone, unbalanced muscle  activation, decreased coordination, and decreased motor planning, decreased visual perceptual skills, decreased visual motor skills, field cut, and hemianopsia, decreased attention to left and decreased motor planning, decreased initiation, decreased attention, decreased awareness, decreased problem solving, decreased safety awareness, decreased memory, and delayed processing, central origin, and decreased sitting balance, decreased standing balance, decreased postural control, hemiplegia, and decreased balance strategies.  Prior to hospitalization, patient could complete BADLs with modified independent .  Patient will benefit from skilled intervention to decrease level of assist with basic self-care skills and increase independence with basic self-care skills prior to discharge home with care partner.  Anticipate patient will require 24 hour supervision and follow up home health.  OT - End of Session Activity Tolerance: Decreased this session Endurance Deficit: Yes OT Assessment Rehab Potential (ACUTE ONLY): Good OT Patient demonstrates impairments in the following area(s): Balance;Perception;Cognition;Sensory;Safety;Edema;Skin Integrity;Endurance;Vision;Motor;Pain OT Basic ADL's Functional Problem(s): Grooming;Bathing;Dressing;Toileting;Eating OT Transfers Functional Problem(s): Tub/Shower;Toilet OT Additional Impairment(s): Fuctional Use of Upper Extremity OT Plan OT Intensity: Minimum of 1-2 x/day, 45 to 90 minutes OT Frequency: 5 out of 7 days OT Duration/Estimated Length of Stay: 3-4 weeks OT Treatment/Interventions: Balance/vestibular training;Discharge planning;Functional electrical stimulation;Pain management;Therapeutic Activities;Self Care/advanced ADL retraining;UE/LE Coordination activities;Cognitive remediation/compensation;Disease mangement/prevention;Functional mobility training;Skin care/wound managment;Patient/family education;Community reintegration;DME/adaptive equipment  instruction;Neuromuscular re-education;Psychosocial support;Splinting/orthotics;UE/LE Strength taining/ROM;Therapeutic Exercise;Visual/perceptual remediation/compensation;Wheelchair propulsion/positioning OT Self Feeding Anticipated Outcome(s): Min A OT Basic Self-Care Anticipated Outcome(s): Min A OT Toileting Anticipated Outcome(s): Min A OT Bathroom Transfers Anticipated Outcome(s): Min A OT Recommendation Patient destination: Home Follow Up Recommendations: Home health OT Equipment Recommended: To be determined Equipment Details: Anticipate pt may need TTB and BSC   OT Evaluation Precautions/Restrictions  Precautions Precautions: Fall Precaution Comments: left hemiparesis, left visual field cut, L inattention Restrictions Weight Bearing Restrictions Per Provider Order: No General Chart Reviewed: Yes Additional Pertinent History: DM-2, Previous CVA, TIAs, PAD Family/Caregiver Present: No Vital Signs Therapy Vitals Temp: 98 F (36.7 C) Temp Source: Oral Pulse Rate: 71 Resp: 17 BP: (!) 140/72 Patient Position (if appropriate): Sitting Oxygen Therapy SpO2: 100 % O2 Device: Room Air Pain Pain Assessment Pain Scale: 0-10 Pain Score: 0-No pain Home Living/Prior Functioning Home Living Living Arrangements: Children, Other relatives Available Help at Discharge: Available PRN/intermittently Type of Home: Apartment Home Access: Level entry Home Layout: One level Bathroom Shower/Tub: [employer] Accessibility: Yes Additional Comments: works as a Geologist, Engineering With: Family IADL History Homemaking Responsibilities: No Current License: No Mode of Transportation: Family Leisure and Hobbies: Enjoys spending time with her family, singing, reading her Bible Prior Function Level of Independence: Independent with basic ADLs, Requires assistive device for independence, Independent with gait, Independent with transfers  Able to  Take Stairs?: Yes Driving: No Vocation: Retired Administrator, Sports Baseline Vision/History: 1 Wears glasses (Pt reports she wears readers) Ability to See in Adequate Light: 1 Impaired Patient Visual Report: Other (comment) (Pt states I just can't see anything) Vision Assessment?: Yes Eye Alignment: Impaired (comment) (gaze to the R) Ocular Range of Motion: Restricted on the left (decreased scanning to the L past midline) Alignment/Gaze Preference: Head turned;Gaze right Tracking/Visual Pursuits: Decreased smoothness of horizontal tracking;Decreased smoothness of vertical tracking;Impaired - to be further tested in functional context;Decreased smoothness of eye movement to LEFT  superior field;Decreased smoothness of eye movement to LEFT inferior field (lost fixation when trying to track to L past midline; Pt is able to track to left to locate therapist intermitently during session) Saccades: Additional eye shifts occurred during testing;Impaired - to be further tested in functional context Convergence: Impaired (comment) (R eye did not converge) Visual Fields: Left visual field deficit Additional Comments: Pt with severe L hemianopsia, not able to see anything peripherally until it was almost at midline from the L side. Keeps head and eyes to the R, however will scan across midline to the left to locate therapist, however Pt unable to maintain Perception  Perception: Impaired Praxis Praxis: Impaired Praxis Impairment Details: Ideomotor;Motor planning Cognition Cognition Overall Cognitive Status: Impaired/Different from baseline Arousal/Alertness: Awake/alert Orientation Level: Person;Place;Situation Person: Oriented Place: Oriented Situation: Oriented Memory: Impaired Memory Impairment: Retrieval deficit;Storage deficit Attention: Sustained Sustained Attention: Impaired Sustained Attention Impairment: Verbal basic;Functional basic Awareness: Impaired Awareness Impairment: Emergent  impairment Problem Solving: Impaired Problem Solving Impairment: Verbal basic;Functional basic Safety/Judgment: Impaired Brief Interview for Mental Status (BIMS) Repetition of Three Words (First Attempt): 3 Temporal Orientation: Year: Correct Temporal Orientation: Month: Accurate within 5 days Temporal Orientation: Day: Correct Recall: Sock: Yes, after cueing (something to wear) Recall: Blue: Yes, after cueing (a color) Recall: Bed: Yes, after cueing (a piece of furniture) BIMS Summary Score: 12 Sensation Sensation Light Touch: Impaired Detail Light Touch Impaired Details: Impaired LUE;Impaired LLE Hot/Cold: Impaired Detail Hot/Cold Impaired Details: Impaired LUE;Impaired LLE Proprioception: Impaired Detail Proprioception Impaired Details: Impaired LLE;Impaired LUE Stereognosis: Not tested Coordination Gross Motor Movements are Fluid and Coordinated: No Fine Motor Movements are Fluid and Coordinated: No Coordination and Movement Description: Significant L LE proprioceptive deficits impacting awareness of L LE positioning with flexor withdrawl present in standing Finger Nose Finger Test: Smooth equal movements on R, however significant dysmetria and undershooting on L d/t proprioceptive deficits Motor  Motor Motor: Hemiplegia;Motor impersistence Motor - Skilled Clinical Observations: Significant L LE proprioceptive deficits impacting awareness of L LE positioning with flexor withdrawl present in standing  Trunk/Postural Assessment  Cervical Assessment Cervical Assessment: Exceptions to [facility] (forward head) Thoracic Assessment Thoracic Assessment: Exceptions to [facility] (rounded shoudlers) Lumbar Assessment Lumbar Assessment: Exceptions to [facility] (posterior pelvic tilt) Postural Control Postural Control: Deficits on evaluation Trunk Control: decreased  Balance Balance Balance Assessed: Yes Static Sitting Balance Static Sitting - Balance Support: Feet supported Static  Sitting - Level of Assistance: 4: Min assist Dynamic Sitting Balance Dynamic Sitting - Balance Support: Feet unsupported Dynamic Sitting - Level of Assistance: 4: Min assist Static Standing Balance Static Standing - Balance Support: Right upper extremity supported Static Standing - Level of Assistance: 2: Max assist Extremity/Trunk Assessment RUE Assessment RUE Assessment: Within Functional Limits LUE Assessment LUE Assessment: Exceptions to [facility] Active Range of Motion (AROM) Comments: limited to ~120 degrees shoudler flexion General Strength Comments: Ulnar drift, mild tone noted during functional reach, grip 3+/5, strength 3-/5 overall LUE Body System: Neuro Brunstrum levels for arm and hand: Arm;Hand Brunstrum level for arm: Stage IV Movement is deviating from synergy Brunstrum level for hand: Stage IV Movements deviating from synergies  Care Tool Care Tool Self Care Eating   Eating Assist Level: Total Assistance - Patient < 25%    Oral Care    Oral Care Assist Level: Total assistance - Patient < 25%    Bathing   Body parts bathed by patient: Face;Front perineal area;Left arm;Chest Body parts bathed by helper: Buttocks;Right upper leg;Left upper leg;Left lower leg;Right lower leg;Front  perineal area;Right arm;Left arm;Abdomen   Assist Level: Maximal Assistance - Patient 24 - 49%    Upper Body Dressing(including orthotics)   What is the patient wearing?: Pull over shirt   Assist Level: Total Assistance - Patient < 25%    Lower Body Dressing (excluding footwear)   What is the patient wearing?: Pants;Underwear/pull up Assist for lower body dressing: Total Assistance - Patient < 25%    Putting on/Taking off footwear   What is the patient wearing?: Socks Assist for footwear: Dependent - Patient 0%       Care Tool Toileting Toileting activity   Assist for toileting: Total Assistance - Patient < 25%     Care Tool Bed Mobility Roll left and right activity   Roll left  and right assist level: Moderate Assistance - Patient 50 - 74%    Sit to lying activity   Sit to lying assist level: Maximal Assistance - Patient 25 - 49%    Lying to sitting on side of bed activity   Lying to sitting on side of bed assist level: the ability to move from lying on the back to sitting on the side of the bed with no back support.: Maximal Assistance - Patient 25 - 49%     Care Tool Transfers Sit to stand transfer   Sit to stand assist level: Maximal Assistance - Patient 25 - 49%    Chair/bed transfer   Chair/bed transfer assist level: Maximal Assistance - Patient 25 - 49%     Toilet transfer   Assist Level: Maximal Assistance - Patient 24 - 49%     Care Tool Cognition  Expression of Ideas and Wants Expression of Ideas and Wants: 3. Some difficulty - exhibits some difficulty with expressing needs and ideas (e.g, some words or finishing thoughts) or speech is not clear  Understanding Verbal and Non-Verbal Content Understanding Verbal and Non-Verbal Content: 3. Usually understands - understands most conversations, but misses some part/intent of message. Requires cues at times to understand   Memory/Recall Ability Memory/Recall Ability : That he or she is in a hospital/hospital unit;Current season   Refer to Care Plan for Long Term Goals  SHORT TERM GOAL WEEK 1 OT Short Term Goal 1 (Week 1): Pt will maintain sitting balance with CGA during BADLs OT Short Term Goal 2 (Week 1): Pt will locate 3/3 items in L visual field with min verbal cues OT Short Term Goal 3 (Week 1): Pt will recall hemi-dressing technique with min questioning cues OT Short Term Goal 4 (Week 1): Pt will complete functional transfers to toilet LRAD mod A  Recommendations for other services: None    Skilled Therapeutic Intervention Skilled Therapeutic Interventions/Progress Updates 1:1 OT evaluation and intervention initiated with skilled education provided on OT role, goals, and POC. Pt received  reclined in bed presenting to be in good spirits receptive to skilled OT session reporting 0/10 pain- OT offering intermittent rest breaks, repositioning, and therapeutic support to optimize participation in therapy session. Pt bed level BADLs at levels listed below this session. Pt presenting with significant L visual field cut, L inattention, significant L U/LE sensory loss, L hemiparesis, L proprioception deficits, and balance deficits impacting her independence in BADLs. Pt would benefit from continued OT services in IPR setting. Pt was left resting in bed with call bell in reach, bed alarm on, and all needs met.   ADL ADL Eating: Maximal assistance Where Assessed-Eating: Bed level Grooming: Dependent;Maximal assistance Where Assessed-Grooming: Bed level Upper Body  Bathing: Maximal assistance Where Assessed-Upper Body Bathing: Bed level Lower Body Bathing: Maximal assistance Where Assessed-Lower Body Bathing: Bed level Upper Body Dressing: Minimal assistance Where Assessed-Upper Body Dressing: Edge of bed Lower Body Dressing: Maximal assistance Where Assessed-Lower Body Dressing: Bed level Toileting: Maximal assistance Where Assessed-Toileting: Bed level Toilet Transfer: Maximal assistance Toilet Transfer Method: Squat pivot Toilet Transfer Equipment: Animator Transfer: Not assessed Film/video Editor: Not assessed Mobility  Bed Mobility Bed Mobility: Rolling Right;Rolling Left;Supine to Sit;Sit to Supine Rolling Right: Moderate Assistance - Patient 50-74% Rolling Left: Moderate Assistance - Patient 50-74% Supine to Sit: Maximal Assistance - Patient - Patient 25-49% Sit to Supine: Maximal Assistance - Patient 25-49% Transfers Sit to Stand: Maximal Assistance - Patient 25-49% Stand to Sit: Maximal Assistance - Patient 25-49%   Discharge Criteria: Patient will be discharged from OT if patient refuses treatment 3 consecutive times without medical reason, if  treatment goals not met, if there is a change in medical status, if patient makes no progress towards goals or if patient is discharged from hospital.  The above assessment, treatment plan, treatment alternatives and goals were discussed and mutually agreed upon: by patient  Katheryn SHAUNNA [place]ines 02/19/2023, 1:04 PM

## 2023-02-19 NOTE — Plan of Care (Signed)
  Problem: RH Balance Goal: LTG Patient will maintain dynamic sitting balance (PT) Description: LTG:  Patient will maintain dynamic sitting balance with assistance during mobility activities (PT) Flowsheets (Taken 02/19/2023 2055) LTG: Pt will maintain dynamic sitting balance during mobility activities with:: Contact Guard/Touching assist Goal: LTG Patient will maintain dynamic standing balance (PT) Description: LTG:  Patient will maintain dynamic standing balance with assistance during mobility activities (PT) Flowsheets (Taken 02/19/2023 2055) LTG: Pt will maintain dynamic standing balance during mobility activities with:: Minimal Assistance - Patient > 75%   Problem: Sit to Stand Goal: LTG:  Patient will perform sit to stand with assistance level (PT) Description: LTG:  Patient will perform sit to stand with assistance level (PT) Flowsheets (Taken 02/19/2023 2055) LTG: PT will perform sit to stand in preparation for functional mobility with assistance level: Contact Guard/Touching assist   Problem: RH Bed Mobility Goal: LTG Patient will perform bed mobility with assist (PT) Description: LTG: Patient will perform bed mobility with assistance, with/without cues (PT). Flowsheets (Taken 02/19/2023 2055) LTG: Pt will perform bed mobility with assistance level of: Contact Guard/Touching assist   Problem: RH Bed to Chair Transfers Goal: LTG Patient will perform bed/chair transfers w/assist (PT) Description: LTG: Patient will perform bed to chair transfers with assistance (PT). Flowsheets (Taken 02/19/2023 2055) LTG: Pt will perform Bed to Chair Transfers with assistance level: Contact Guard/Touching assist   Problem: RH Car Transfers Goal: LTG Patient will perform car transfers with assist (PT) Description: LTG: Patient will perform car transfers with assistance (PT). Flowsheets (Taken 02/19/2023 2055) LTG: Pt will perform car transfers with assist:: Contact Guard/Touching assist   Problem:  RH Ambulation Goal: LTG Patient will ambulate in controlled environment (PT) Description: LTG: Patient will ambulate in a controlled environment, # of feet with assistance (PT). Flowsheets (Taken 02/19/2023 2055) LTG: Pt will ambulate in controlled environ  assist needed:: Minimal Assistance - Patient > 75% LTG: Ambulation distance in controlled environment: 179ft using LRAD Goal: LTG Patient will ambulate in home environment (PT) Description: LTG: Patient will ambulate in home environment, # of feet with assistance (PT). Flowsheets (Taken 02/19/2023 2055) LTG: Pt will ambulate in home environ  assist needed:: Minimal Assistance - Patient > 75% LTG: Ambulation distance in home environment: 70ft using LRAD   Problem: RH Wheelchair Mobility Goal: LTG Patient will propel w/c in controlled environment (PT) Description: LTG: Patient will propel wheelchair in controlled environment, # of feet with assist (PT) Flowsheets (Taken 02/19/2023 2055) LTG: Pt will propel w/c in controlled environ  assist needed:: Supervision/Verbal cueing LTG: Propel w/c distance in controlled environment: 126ft Goal: LTG Patient will propel w/c in home environment (PT) Description: LTG: Patient will propel wheelchair in home environment, # of feet with assistance (PT). Flowsheets (Taken 02/19/2023 2055) LTG: Pt will propel w/c in home environ  assist needed:: Supervision/Verbal cueing LTG: Propel w/c distance in home environment: 79ft

## 2023-02-19 NOTE — Progress Notes (Signed)
 Inpatient Rehabilitation Center Individual Statement of Services  Patient Name:  Kathleen Figueroa  Date:  02/19/2023  Welcome to the Inpatient Rehabilitation Center.  Our goal is to provide you with an individualized program based on your diagnosis and situation, designed to meet your specific needs.  With this comprehensive rehabilitation program, you will be expected to participate in at least 3 hours of rehabilitation therapies Monday-Friday, with modified therapy programming on the weekends.  Your rehabilitation program will include the following services:  Physical Therapy (PT), Occupational Therapy (OT), Speech Therapy (ST), 24 hour per day rehabilitation nursing, Therapeutic Recreaction (TR), Neuropsychology, Care Coordinator, Rehabilitation Medicine, Nutrition Services, and Pharmacy Services  Weekly team conferences will be held on Wednesday to discuss your progress.  Your Inpatient Rehabilitation Care Coordinator will talk with you frequently to get your input and to update you on team discussions.  Team conferences with you and your family in attendance may also be held.  Expected length of stay: 3-3.5 weeks  anticipated outcome: Over all min assist level  Depending on your progress and recovery, your program may change. Your Inpatient Rehabilitation Care Coordinator will coordinate services and will keep you informed of any changes. Your Inpatient Rehabilitation Care Coordinator's name and contact numbers are listed  below.  The following services may also be recommended but are not provided by the Inpatient Rehabilitation Center:   Home Health Rehabiltiation Services Outpatient Rehabilitation Services    Arrangements will be made to provide these services after discharge if needed.  Arrangements include referral to agencies that provide these services.  Your insurance has been verified to be:   [employer] Your primary doctor is:  Charmaine Bright  Pertinent information will  be shared with your doctor and your insurance company.  Inpatient Rehabilitation Care Coordinator:  Rhoda Clement, KEN [phone] or ELIGAH BASQUES  Information discussed with and copy given to patient by: Clement Asberry MATSU, 02/19/2023, 12:49 PM

## 2023-02-19 NOTE — Progress Notes (Signed)
 PROGRESS NOTE   Subjective/Complaints:  Elevated BP this am , received amlodipine  5mg  x 1  Repeat CT head showing expected evolution of R PCA infarct , no hemorrhagic transformation  Oriented to person , place and CVA ROS- denies pain , breathing issues or bowel issues (suspect poor awareness of deficits)  Objective:   CT HEAD WO CONTRAST ( ) Result Date: 02/19/2023 CLINICAL DATA:  88 year old female status post trauma, fall. Right PCA infarct. Right PCA occlusion. EXAM: CT HEAD WITHOUT CONTRAST TECHNIQUE: Contiguous axial images were obtained from the base of the skull through the vertex without intravenous contrast. RADIATION DOSE REDUCTION: This exam was performed according to the departmental dose-optimization program which includes automated exposure control, adjustment of the mA and/or kV according to patient size and/or use of iterative reconstruction technique. COMPARISON:  Head CT and brain MRI 02/11/2023. FINDINGS: Brain: Heterogeneous cytotoxic edema in the right PCA territory including the right thalamus with evolution since 02/11/2023. Continued mild regional mass effect. Petechial hemorrhage as seen by MRI, but no malignant hemorrhagic transformation. No extension compared to the previous DWI. Superimposed chronic infarcts with encephalomalacia in the posterior left MCA and left MCA/PCA watershed territories. Chronic lacunar infarcts of the bilateral deep gray nuclei, genu of the corpus callosum, and pons. Confluent additional bilateral cerebral white matter hypodensity. No midline shift or significant intracranial mass effect. Stable ventricles. Vascular: Stable.  Calcified atherosclerosis at the skull base. Skull: Stable and intact. Sinuses/Orbits: Visualized paranasal sinuses and mastoids are stable and well aerated. Other: No acute orbit or scalp soft tissue finding. Postoperative changes to both globes. IMPRESSION: 1. Expected  evolution of large Right PCA territory infarct since 02/11/2023. Petechial hemorrhage but no malignant hemorrhagic transformation. No significant intracranial mass effect. 2. Underlying advanced chronic ischemic disease. 3. No new intracranial abnormality. Electronically Signed   By: VEAR Hurst M.D.   On: 02/19/2023 04:49   DG Chest Port 1 View Result Date: 02/18/2023 CLINICAL DATA:  203532 Pulmonary nodule 203532. EXAM: PORTABLE CHEST 1 VIEW COMPARISON:  Shoulder radiograph from 02/12/2023 FINDINGS: Bilateral lung fields are clear. Bilateral costophrenic angles are clear. There is no discrete lung nodule corresponding to the opacity seen on the recent shoulder radiograph. In retrospect, the opacity may be artifactual due to overlying monitoring electrodes sticker. Normal cardio-mediastinal silhouette. No acute osseous abnormalities. The soft tissues are within normal limits. IMPRESSION: No acute cardiopulmonary abnormality. Electronically Signed   By: Ree Molt M.D.   On: 02/18/2023 14:51   No results for input(s): WBC, HGB, HCT, PLT in the last 72 hours. No results for input(s): NA, K, CL, CO2, GLUCOSE, BUN, CREATININE, CALCIUM  in the last 72 hours. No intake or output data in the 24 hours ending 02/19/23 0803      Physical Exam: Vital Signs Blood pressure (!) 167/78, pulse 76, temperature 98 F (36.7 C), resp. rate 18, height 5' 6 (1.676 m), weight 65.7 kg, SpO2 98%.   General: No acute distress Mood and affect are appropriate Heart: Regular rate and rhythm no rubs murmurs or extra sounds Lungs: Clear to auscultation, breathing unlabored, no rales or wheezes Abdomen: Positive bowel sounds, soft nontender to palpation, nondistended Extremities: No clubbing, cyanosis,  or edema Skin: No evidence of breakdown, no evidence of rash Neurologic: Cranial nerves II through XII intact, motor strength is 5/5 in right deltoid, bicep, tricep, grip, hip flexor, knee extensors,  ankle dorsiflexor and plantar flexor Left side difficult to examine due to severe sensory ataxia Wandering LUE movements  Sensory exam absent sensation to light touch and proprioception in LEFT upper and lower extremities Cerebellar exam severe sensory ataxia LUE and LLE  Musculoskeletal: Full range of motion in all 4 extremities. No joint swelling   Assessment/Plan: 1. Functional deficits which require 3+ hours per day of interdisciplinary therapy in a comprehensive inpatient rehab setting. Physiatrist is providing close team supervision and 24 hour management of active medical problems listed below. Physiatrist and rehab team continue to assess barriers to discharge/monitor patient progress toward functional and medical goals  Care Tool:  Bathing              Bathing assist       Upper Body Dressing/Undressing Upper body dressing        Upper body assist      Lower Body Dressing/Undressing Lower body dressing            Lower body assist       Toileting Toileting    Toileting assist       Transfers Chair/bed transfer  Transfers assist           Locomotion Ambulation   Ambulation assist              Walk 10 feet activity   Assist           Walk 50 feet activity   Assist           Walk 150 feet activity   Assist           Walk 10 feet on uneven surface  activity   Assist           Wheelchair     Assist               Wheelchair 50 feet with 2 turns activity    Assist            Wheelchair 150 feet activity     Assist          Blood pressure (!) 167/78, pulse 76, temperature 98 F (36.7 C), resp. rate 18, height 5' 6 (1.676 m), weight 65.7 kg, SpO2 98%.  Medical Problem List and Plan: 1. Functional deficits secondary to ischemic infarct right PCA territory ( Right temporal infarct 11/12/22, old Left MCA infarct 2015)              -patient may shower             -ELOS/Goals:  18-21 days, Supervision to Min A OT/PT/SLP             Large PCA infarct with severe sensory deficits on left alien arm on left, will have limited progress unless sensory deficits improve    2.  Antithrombotics: -DVT/anticoagulation:  Pharmaceutical: Heparin              -antiplatelet therapy: Aspirin  and Brilinta  for three 90 days followed by aspirin  alone  hx left CEA 3. Pain Management: Tylenol  as needed   4. Mood/Behavior/Sleep: LCSW to evaluate and provide emotional support             -antipsychotic agents: n/a              -  Melatonin PRN   5. Neuropsych/cognition: This patient is not quite capable of making decisions on her own behalf.   6. Skin/Wound Care: Routine skin care checks   7. Fluids/Electrolytes/Nutrition: Routine Is and Os and follow-up chemistries   8: Hypertension: monitor TID and prn (Diovan  320 mg daily at home not restarted) Vitals:   02/19/23 0700 02/19/23 0749  BP: (!) 149/79 (!) 167/78  Pulse: 75 76  Resp:  18  Temp:  98 F (36.7 C)  SpO2:  98%      9: Hyperlipidemia: continue statin   10: DM-2: A1c = 6.4% on October, 2024 (no home meds)   11: Elevated serum creatinine/GFR ~55: ? Mild CKD; now at baseline ~1.0             -follow-up BMP      Latest Ref Rng & Units 02/14/2023    6:31 AM 02/13/2023    6:51 PM 02/13/2023    9:47 AM  BMP  Glucose 70 - 99 mg/dL 864  848  899   BUN 8 - 23 mg/dL 13  18  15    Creatinine 0.44 - 1.00 mg/dL 8.98  8.80  8.86   Sodium 135 - 145 mmol/L 136  135  140   Potassium 3.5 - 5.1 mmol/L 3.5  3.9  3.1   Chloride 98 - 111 mmol/L 101  99  99   CO2 22 - 32 mmol/L 25  22  23    Calcium  8.9 - 10.3 mg/dL 8.3  9.0  8.6    Restart ARB , medium dose Avapro  150mg  daily , (home dose diovan  320mg  every day) may need to titrate up  Rapid response team gave amlodipine  5mg  last noc 1/10 LOS: 1 days A FACE TO FACE EVALUATION WAS PERFORMED  Kathleen Figueroa 02/19/2023, 8:03 AM

## 2023-02-19 NOTE — Discharge Summary (Signed)
Physician Discharge Summary  Patient ID: Kathleen Figueroa MRN: [id number] DOB/AGE: [birth year] 88 y.o.  Admit date: 02/18/2023 Discharge date: 03/15/2023  Discharge Diagnoses:  Active Problems:   CVA (cerebral vascular accident) (HCC)   Stage 2 chronic kidney disease   Diabetes mellitus (HCC)   Primary hypertension Hypertension Hyperlipidemia Diabetes mellitus type 2 Elevated serum creatinine Neuropathic pain OA left knee  Discharged Condition: stable  Significant Diagnostic Studies: CT HEAD WO CONTRAST ( ) Result Date: 02/19/2023 CLINICAL DATA:  88 year old female status post trauma, fall. Right PCA infarct. Right PCA occlusion. EXAM: CT HEAD WITHOUT CONTRAST TECHNIQUE: Contiguous axial images were obtained from the base of the skull through the vertex without intravenous contrast. RADIATION DOSE REDUCTION: This exam was performed according to the departmental dose-optimization program which includes automated exposure control, adjustment of the mA and/or kV according to patient size and/or use of iterative reconstruction technique. COMPARISON:  Head CT and brain MRI 02/11/2023. FINDINGS: Brain: Heterogeneous cytotoxic edema in the right PCA territory including the right thalamus with evolution since 02/11/2023. Continued mild regional mass effect. Petechial hemorrhage as seen by MRI, but no malignant hemorrhagic transformation. No extension compared to the previous DWI. Superimposed chronic infarcts with encephalomalacia in the posterior left MCA and left MCA/PCA watershed territories. Chronic lacunar infarcts of the bilateral deep gray nuclei, genu of the corpus callosum, and pons. Confluent additional bilateral cerebral white matter hypodensity. No midline shift or significant intracranial mass effect. Stable ventricles. Vascular: Stable.  Calcified atherosclerosis at the skull base. Skull: Stable and intact. Sinuses/Orbits: Visualized paranasal sinuses and mastoids are stable and well  aerated. Other: No acute orbit or scalp soft tissue finding. Postoperative changes to both globes. IMPRESSION: 1. Expected evolution of large Right PCA territory infarct since 02/11/2023. Petechial hemorrhage but no malignant hemorrhagic transformation. No significant intracranial mass effect. 2. Underlying advanced chronic ischemic disease. 3. No new intracranial abnormality. Electronically Signed   By: Odessa Fleming M.D.   On: 02/19/2023 04:49   DG Chest Port 1 View Result Date: 02/18/2023 CLINICAL DATA:  203532 Pulmonary nodule 203532. EXAM: PORTABLE CHEST 1 VIEW COMPARISON:  Shoulder radiograph from 02/12/2023 FINDINGS: Bilateral lung fields are clear. Bilateral costophrenic angles are clear. There is no discrete lung nodule corresponding to the opacity seen on the recent shoulder radiograph. In retrospect, the opacity may be artifactual due to overlying monitoring electrodes sticker. Normal cardio-mediastinal silhouette. No acute osseous abnormalities. The soft tissues are within normal limits. IMPRESSION: No acute cardiopulmonary abnormality. Electronically Signed   By: Jules Schick M.D.   On: 02/18/2023 14:51     Labs:  Basic Metabolic Panel: Recent Labs  Lab 03/11/23 0537 03/15/23 0534  NA 139 140  K 4.2 4.2  CL 105 106  CO2 25 28  GLUCOSE 109* 99  BUN 22 17  CREATININE 1.07* 1.10*  CALCIUM 8.6* 9.0    CBC:    Latest Ref Rng & Units 03/15/2023    5:34 AM 03/08/2023    5:48 AM 03/01/2023    5:08 AM  CBC  WBC 4.0 - 10.5 K/uL 5.0  4.8  6.0   Hemoglobin 12.0 - 15.0 g/dL 40.1  02.7  25.3   Hematocrit 36.0 - 46.0 % 36.7  38.6  38.9   Platelets 150 - 400 K/uL 204  203  242     CBG: Recent Labs  Lab 03/10/23 1940  GLUCAP 88    Brief HPI:   Kathleen Figueroa is a 88 y.o. female  who presented to the ED  on 02/11/2023 after being found down at home complaining of LUE heaviness and vision deficit. She has history of prior CVA in 2015 with MRI revealed multiple small foci acute infarcts in  the left cerebral hemisphere along with chronic infarcts and moderate small vessel ischemic disease there was no major intracranial occlusion. she then underwent left carotid endarterectomy on 01/19/2014. She is maintained on aspirin and Plavix at home and there was some concern for the possibility she was not taking Plavix as instructed. On arrival, CT head without contrast was concerning for a right PCA acute/subacute infarct and neurology was consulted. MRI showed large right PCA territory infarct. Now on aspirin and Brilinta 90 mg BID for 90 days preferred. If medication not covered by insurance, then Brilinta and aspirin for 30 days and then continue on DAPT therapy, aspirin and Plavix.  2D Echo LVEF 60-65% with Grade I diastolic dysfunction. LDL 193. HgbA1c 6.5. VTE prophylaxis - hep SQ. Tolerating regular diet with thin liquids. PMH: TIAs, hypertension, DM-2, PAD, bilateral carotid artery stenosis. Serum creatinine c/w mild CKD. Per PT note on 1/08: Pt continues to be limited by significantly decreased/impaired L attention, sensation and proprioception with pt needing cues throughout session to attend to L side and environment and increased assist on L throughout mobility. Pt able to come to stand with L knee blocked with mod A to boost and max A to pivot toward R to chair. Pt with poor motor planning, unable to shift weight L to step RLE during tranfers despite max multimodal cues and tactile cues to step with R. Patient lives at home with her daughter and son-in-law.  She was independent with a cane and walker prior to this admission.  She had been receiving some intermittent home health therapy.  Their home is 1 level with level entry.    Hospital Course: Kathleen Figueroa was admitted to rehab 02/18/2023 for inpatient therapies to consist of PT, ST and OT at least three hours five days a week. Past admission physiatrist, therapy team and rehab RN have worked together to provide customized collaborative  inpatient rehab. Rapid response called early morning of 1/10 due to worsened facial droop. Repeat CT head showing expected evolution of R PCA infarct , no hemorrhagic transformation. Given amlodipine 5 mg due to elevated BP>>214/88.  Follow-up labs with increase in creatinine to 1.20 and PO liquids encouraged. Resting hand splint ordered for left wrist. Poor attention to left is her main limitation. Requires cueing to attend. Serum creatinine down to 1.17 on recheck 1/13. Started gabapentin 100 mg TID for neuropathic pain  (LUE) on 1/17. Scr elevated to 1.31 on 1/20. Severe left knee OA with contracture , pain ok unless knee is flexed to >90 deg. Ongoing attention to constipation. Upgraded from D2 to D3 diet on 1/28. Right great toe pain reported 1/30 and appears to be intermittent or may be associated with shoe wear, exam is negative, no history of trauma or evidence of trauma she had a similar episode of left great toe pain yesterday or the day before with out physical exam findings. Serum creatinine improved off ARB to 1.07 on 2/01. Rechecked on 2/03 and 1.10. CBC WNL. Remains incontinent of bowel and bladder despite toileting program.   Blood pressures were monitored on TID basis and restarted Avapro 150 mg daily 1/10. Increased Avapro to 300 mg daily due to elevated BP trend on 1/15. Restarted amlodipine 2.5 mg on 1/24. Discontinued Avapro 1/27 due to elevated serum creatinine. Amlodipine increased to  10 mg daily.  Rehab course: During patient's stay in rehab weekly team conferences were held to monitor patient's progress, set goals and discuss barriers to discharge. At admission, patient required max with basic self-care skills and  max A with mobility.  She  has had improvement in activity tolerance, balance, postural control as well as ability to compensate for deficits. She has had improvement in functional use RUE/LUE  and RLE/LLE as well as improvement in awareness  Discharge disposition:  03-Skilled Nursing Facility     Diet: dysphagia 3, thin liquids  Special Instructions: No driving, alcohol consumption or tobacco use.  Plan to continue aspirin and Brilinta therapy for a total of 90 days followed by aspirin alone. Follow-up neurology, vascular surgery.  Discharge Instructions     Ambulatory referral to Neurology   Complete by: As directed    An appointment is requested in approximately: 4 weeks   Ambulatory referral to Physical Medicine Rehab   Complete by: As directed    Hospital follow-up   Discharge patient   Complete by: As directed    Discharge disposition: 03-Skilled Nursing Facility   Discharge patient date: 03/15/2023      Allergies as of 03/15/2023       Reactions   Aspirin Nausea Only   Higher dose        Medication List     STOP taking these medications    Bayer Contour Next Test test strip Generic drug: glucose blood   clopidogrel 75 MG tablet Commonly known as: PLAVIX   Milk Thistle 1000 MG Caps   OVER THE COUNTER MEDICATION   OVER THE COUNTER MEDICATION   OVER THE COUNTER MEDICATION   OVER THE COUNTER MEDICATION   valsartan 320 MG tablet Commonly known as: DIOVAN       TAKE these medications    amLODipine 10 MG tablet Commonly known as: NORVASC Take 1 tablet (10 mg total) by mouth daily.   aspirin EC 81 MG tablet Take 1 tablet (81 mg total) by mouth daily. Swallow whole.   atorvastatin 80 MG tablet Commonly known as: LIPITOR Take 1 tablet (80 mg total) by mouth daily.   gabapentin 100 MG capsule Commonly known as: NEURONTIN Take 2 capsules (200 mg total) by mouth 3 (three) times daily.   polyethylene glycol 17 g packet Commonly known as: MIRALAX / GLYCOLAX Take 17 g by mouth 2 (two) times daily.   ticagrelor 90 MG Tabs tablet Commonly known as: BRILINTA Take 1 tablet (90 mg total) by mouth 2 (two) times daily.        Contact information for follow-up providers     Jarrett Soho, PA-C Follow up.    Specialty: Family Medicine Why: Call the office in 1 to 2 days to make arrangements for hospital follow-up appointment. Contact information: [address] [place] [address] [phone]         Erick Colace, MD Follow up.   Specialty: Physical Medicine and Rehabilitation Why: office will call you to arrange your appt (sent) Contact information: [address] [place] [address] [phone]         [facility] Follow up.   Why: Call the office in 1 to 2 days to make arrangements for hospital follow-up appointment Contact information: [address]     [address] [place] [address] [phone]             Contact information for after-discharge care  Destination     [facility] Preferred SNF .   Service: Skilled Nursing Contact information: [address] [place] [address] [phone]                     Signed: Milinda Antis, PA-C 03/15/2023, 9:48 AM

## 2023-02-19 NOTE — Significant Event (Signed)
 Rapid Response Event Note   Reason for Call :  Worsened L facial droop  Pt with know R PCA territory infarct.   Initial Focused Assessment:  Pt lying in bed with eyes open, in no visible distress. She is able to answer questions and move all extremities. She denies CP/SOB/dizziness. NIH-7 for R gaze preference, L visual deficit, L facial droop, L arm drift, L sensory deficit, dysarthria and extinction. Per RN, pt's facial droop was worse PTA RRT but it is now back to baseline. Pupils 3 and equal. Skin warm and dry.   T-98.5, HR-81, BP-214/88, RR-20, SpO2-99% on RA  Interventions:  CBG-119 CT head STAT Norvasc  5mg  PO once Plan of Care:  Stroke symptoms/NIH consistent with previously charted values.  Obtain CT and await results. Treat BP. Continue to monitor pt. Please call RRT if further assistance needed.   Event Summary:   MD Notified: Fidela, NP Call [phone] Arrival [phone] End [id number]  Tish Graeme Piety, RN

## 2023-02-19 NOTE — Evaluation (Signed)
 Physical Therapy Assessment and Plan  Patient Details  Name: Kathleen Figueroa MRN: [id number] Date of Birth: 1936/01/07  PT Diagnosis: Abnormal posture, Abnormality of gait, Ataxia, Cognitive deficits, Coordination disorder, Difficulty walking, Hemiparesis non-dominant, Impaired cognition, Impaired sensation, and Muscle weakness Rehab Potential: Good ELOS: 3.5 weeks   Today's Date: 02/19/2023 PT Individual Time: 1351-1501 PT Individual Time Calculation (min): 70 min    Hospital Problem: Active Problems:   CVA (cerebral vascular accident) [facility])   Past Medical History:  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)    CVA (cerebral vascular accident) (HCC) 11/12/2022   Diabetes mellitus type 2, uncontrolled, 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   H/O detached retina repair    Hypertension    TIA (transient ischemic attack)    Past Surgical History:  Past Surgical History:  Procedure Laterality Date   ENDARTERECTOMY Left 01/19/2014   Procedure: ENDARTERECTOMY CAROTID-LEFT;  Surgeon: Gaile LELON New, MD;  Location: [facility] OR;  Service: Vascular;  Laterality: Left;   EYE SURGERY     lens removed from right eye   PATCH ANGIOPLASTY Left 01/19/2014   Procedure: PATCH ANGIOPLASTY, LEFT CAROTID ARTERY USING HEMASHIELD PLATINUM FINESSE PATCH;  Surgeon: Gaile LELON New, MD;  Location: [facility] OR;  Service: Vascular;  Laterality: Left;   TONSILLECTOMY      Assessment & Plan Clinical Impression: Patient is a 88 y.o. year old female who presented to the ED on 02/11/2023 after being found down at home complaining of LUE heaviness and vision deficit. She has history of prior CVA in 2015 with MRI revealed multiple  small foci acute infarcts in the left cerebral hemisphere along with chronic infarcts and moderate small vessel ischemic disease there was no major intracranial occlusion. she then underwent left carotid endarterectomy on 01/19/2014. She is maintained on aspirin  and Plavix  at home and there was some concern for the possibility she was not taking Plavix  as instructed. On arrival, CT head without contrast was concerning for a right PCA acute/subacute infarct and neurology was consulted. MRI showed large right PCA territory infarct. Now on aspirin  and Brilinta  90 mg BID for 90 days preferred. If medication not covered by insurance, then Brilinta  and aspirin  for 30 days and then continue on DAPT therapy, aspirin  and Plavix .  2D Echo LVEF 60-65% with Grade I diastolic dysfunction. LDL 193. HgbA1c 6.5. VTE prophylaxis - hep SQ. Tolerating regular diet with thin liquids. PMH: TIAs, hypertension, DM-2, PAD, bilateral carotid artery stenosis. Serum creatinine c/w mild CKD. Per PT note on 1/08: Pt continues to be limited by significantly decreased/impaired L attention, sensation and proprioception with pt needing cues throughout session to attend to L side and environment and increased assist on L throughout mobility. Pt able to come to stand with L knee blocked with mod A to boost and max A to pivot toward R to chair. Pt with poor motor planning, unable to shift weight L to step RLE during tranfers despite max multimodal cues and tactile cues to step with R. Patient lives at home with her daughter and son-in-law.  She was independent with a cane and walker prior to this admission.  She had been receiving some intermittent home health therapy.  Their  home is 1 level with level entry. The patient requires inpatient medicine and rehabilitation evaluations and services for ongoing dysfunction secondary to right PCA territory infarct.   Son and daughter at bedside. Poor appetite and daughter has been ordering her meals for her  to assist with reading menu and preferences. Patient transferred to CIR on 02/18/2023 .   Patient currently requires max A with mobility secondary to muscle weakness and muscle joint tightness, decreased cardiorespiratoy endurance, impaired timing and sequencing, abnormal tone, unbalanced muscle activation, ataxia, decreased coordination, and decreased motor planning, decreased visual acuity, decreased visual perceptual skills, and decreased visual motor skills, decreased midline orientation, decreased attention to left, and decreased motor planning, decreased initiation, decreased attention, decreased awareness, decreased problem solving, decreased safety awareness, decreased memory, and delayed processing, and decreased sitting balance, decreased standing balance, decreased postural control, hemiplegia, and decreased balance strategies.  Prior to hospitalization, patient was modified independent  with mobility and lived with Family (DTR ([name]) and SIL Dru)) in a Apartment home.  Home access is  Level entry.  Patient will benefit from skilled PT intervention to maximize safe functional mobility, minimize fall risk, and decrease caregiver burden for planned discharge home with 24 hour assist.  Anticipate patient will benefit from follow up [facility] at discharge.  PT - End of Session Activity Tolerance: Tolerates 30+ min activity with multiple rests Endurance Deficit: Yes Endurance Deficit Description: requires frequent seated rest break PT Assessment Rehab Potential (ACUTE/IP ONLY): Good PT Barriers to Discharge: Decreased caregiver support;Neurogenic Bowel & Bladder;Incontinence;Lack of/limited family support PT Patient demonstrates impairments in the following area(s): Balance;Safety;Sensory;Skin Integrity;Endurance;Motor;Nutrition;Pain;Perception PT Transfers Functional Problem(s): Bed Mobility;Bed to Chair;Car;Furniture PT Locomotion Functional Problem(s): Ambulation;Wheelchair Mobility;Stairs PT  Plan PT Intensity: Minimum of 1-2 x/day ,45 to 90 minutes PT Frequency: 5 out of 7 days PT Duration Estimated Length of Stay: 3.5 weeks PT Treatment/Interventions: Ambulation/gait training;Community reintegration;DME/adaptive equipment instruction;Neuromuscular re-education;Psychosocial support;Stair training;UE/LE Strength taining/ROM;Wheelchair propulsion/positioning;Balance/vestibular training;Discharge planning;Functional electrical stimulation;Pain management;Skin care/wound management;Therapeutic Activities;UE/LE Coordination activities;Disease management/prevention;Cognitive remediation/compensation;Functional mobility training;Patient/family education;Splinting/orthotics;Therapeutic Exercise;Visual/perceptual remediation/compensation PT Transfers Anticipated Outcome(s): CGA using LRAD PT Locomotion Anticipated Outcome(s): skilled min A using LRAD PT Recommendation Recommendations for Other Services: Neuropsych consult;Therapeutic Recreation consult Therapeutic Recreation Interventions: Stress management Follow Up Recommendations: 24 hour supervision/assistance;Home health PT Patient destination: Home Equipment Recommended: To be determined   PT Evaluation Precautions/Restrictions Precautions Precautions: Fall;Other (comment) Precaution Comments: L hemiparesis, L visual field cut, L inattention, L hemisensory loss Restrictions Weight Bearing Restrictions Per Provider Order: No Pain Pain Assessment Pain Scale: 0-10 Pain Score: 0-No pain Pain Interference Pain Interference Pain Effect on Sleep: 1. Rarely or not at all Pain Interference with Therapy Activities: 2. Occasionally Pain Interference with Day-to-Day Activities: 1. Rarely or not at all Home Living/Prior Functioning Home Living Available Help at Discharge: Available PRN/intermittently (DTR is educator & Belvie works in prison couseling) Type of Home: Apartment Home Access: Level entry Home Layout: One level Bathroom  Shower/Tub: Engineer, [employer] Systems: Standard Bathroom Accessibility: Yes Additional Comments: reports using SPC > RW to achieve mod-I level; makes her own breakfast but DTR does most other cooking; performs own laundry  Lives With: Family (DTR ([name]) and SIL Dru)) Prior Function Level of Independence: Independent with gait;Independent with transfers  Able to Take Stairs?: No (difficulty due to arthritic L knee) Driving: No Vocation: Retired Optometrist - History Ability to See in Adequate Light: 3 Highly impaired Vision - Assessment Eye Alignment: Impaired (comment) (R gaze preference) Ocular Range of Motion: Other (comment) (L  inattention with inconsistent visual scanning towards L but is able to track that direction with max cuing) Alignment/Gaze Preference: Head turned;Gaze right Tracking/Visual Pursuits: Impaired - to be further tested in functional context Perception Perception: Impaired Preception Impairment Details: Spatial orientation;Inattention/Neglect Perception-Other Comments: L inattention Praxis Praxis: Impaired Praxis Impairment Details: Initiation;Motor planning;Organization  Cognition Overall Cognitive Status: Impaired/Different from baseline Arousal/Alertness: Awake/alert Orientation Level: Oriented X4 Year: 2025 Month: January Day of Week: Correct Attention: Focused;Sustained Focused Attention: Appears intact Sustained Attention: Impaired Sustained Attention Impairment: Verbal basic;Functional basic Memory: Impaired Memory Impairment: Retrieval deficit;Storage deficit Awareness: Impaired Awareness Impairment: Emergent impairment Problem Solving: Impaired Problem Solving Impairment: Verbal basic;Functional basic Safety/Judgment: Impaired Sensation Sensation Light Touch: Impaired Detail Light Touch Impaired Details: Absent LLE;Impaired LUE Hot/Cold: Not tested Proprioception: Impaired Detail Proprioception Impaired  Details: Impaired LLE;Impaired LUE Stereognosis: Not tested Coordination Gross Motor Movements are Fluid and Coordinated: No Fine Motor Movements are Fluid and Coordinated: No Coordination and Movement Description: Significant L hemi-sensory and proprioceptive deficits impacting awareness and positioning of L UE and L LE in addition to hemiparesis Motor  Motor Motor: Hemiplegia;Motor impersistence;Other (comment);Ataxia (sensory ataxia) Motor - Skilled Clinical Observations: Significant L hemi-sensory and proprioceptive deficits impacting awareness and positioning of L UE and L LE in addition to hemiparesis   Trunk/Postural Assessment  Cervical Assessment Cervical Assessment: Exceptions to [facility] (forward head with R rotation bias) Thoracic Assessment Thoracic Assessment: Exceptions to [facility] (rounded shoudlers) Lumbar Assessment Lumbar Assessment: Exceptions to [facility] (posterior pelvic tilt) Postural Control Postural Control: Deficits on evaluation Trunk Control: decreased with impaired midline orientation and posterior lean bias  Balance Balance Balance Assessed: Yes Static Sitting Balance Static Sitting - Balance Support: Feet supported Static Sitting - Level of Assistance: 4: Min assist Dynamic Sitting Balance Dynamic Sitting - Balance Support: Feet supported Dynamic Sitting - Level of Assistance: 3: Mod assist Static Standing Balance Static Standing - Balance Support: During functional activity;Right upper extremity supported Static Standing - Level of Assistance: 3: Mod assist;2: Max assist Dynamic Standing Balance Dynamic Standing - Balance Support: During functional activity;Right upper extremity supported Dynamic Standing - Level of Assistance: 2: Max assist;Other (comment) (+2 for safety) Extremity Assessment  RLE Assessment RLE Assessment: Exceptions to [facility] Active Range of Motion (AROM) Comments: WFL/WNL General Strength Comments: assessed in supine RLE Strength Right Hip  Flexion: 4+/5 Right Knee Flexion: 5/5 Right Knee Extension: 5/5 Right Ankle Dorsiflexion: 5/5 Right Ankle Plantar Flexion: 5/5 LLE Assessment LLE Assessment: Exceptions to [facility] Passive Range of Motion (PROM) Comments: decreased knee flexion ROM due to significant OA General Strength Comments: assessed in supine LLE Strength Left Hip Flexion: 3+/5 Left Knee Flexion: 3+/5 (through available range) Left Knee Extension: 4/5 Left Ankle Dorsiflexion: 3+/5 Left Ankle Plantar Flexion: 4-/5  Care Tool Care Tool Bed Mobility Roll left and right activity   Roll left and right assist level: Moderate Assistance - Patient 50 - 74%    Sit to lying activity   Sit to lying assist level: Maximal Assistance - Patient 25 - 49%    Lying to sitting on side of bed activity   Lying to sitting on side of bed assist level: the ability to move from lying on the back to sitting on the side of the bed with no back support.: Maximal Assistance - Patient 25 - 49%     Care Tool Transfers Sit to stand transfer   Sit to stand assist level: Maximal Assistance - Patient 25 - 49%    Chair/bed transfer   Chair/bed  transfer assist level: Maximal Assistance - Patient 25 - 49%    Car transfer Car transfer activity did not occur: Safety/medical concerns        Care Tool Locomotion Ambulation Ambulation activity did not occur: Safety/medical concerns (requires skilled assistance)        Walk 10 feet activity Walk 10 feet activity did not occur: Safety/medical concerns       Walk 50 feet with 2 turns activity Walk 50 feet with 2 turns activity did not occur: Safety/medical concerns      Walk 150 feet activity Walk 150 feet activity did not occur: Safety/medical concerns      Walk 10 feet on uneven surfaces activity Walk 10 feet on uneven surfaces activity did not occur: Safety/medical concerns      Stairs Stair activity did not occur: Safety/medical concerns        Walk up/down 1 step activity Walk  up/down 1 step or curb (drop down) activity did not occur: Safety/medical concerns      Walk up/down 4 steps activity Walk up/down 4 steps activity did not occur: Safety/medical concerns      Walk up/down 12 steps activity Walk up/down 12 steps activity did not occur: Safety/medical concerns      Pick up small objects from floor Pick up small object from the floor (from standing position) activity did not occur: Safety/medical concerns      Wheelchair Is the patient using a wheelchair?: Yes Type of Wheelchair: Manual (TIS w/c)   Wheelchair assist level: Dependent - Patient 0%    Wheel 50 feet with 2 turns activity   Assist Level: Dependent - Patient 0%  Wheel 150 feet activity   Assist Level: Dependent - Patient 0%    Refer to Care Plan for Long Term Goals  SHORT TERM GOAL WEEK 1 PT Short Term Goal 1 (Week 1): Pt will perform supine<>sit with min A PT Short Term Goal 2 (Week 1): Pt will perform sit<>stands using LRAD with min A PT Short Term Goal 3 (Week 1): Pt will perform bed<>chair transfers with mod A PT Short Term Goal 4 (Week 1): Pt will ambulate at least 63ft using LRAD with mod A of 1 and +2 assist as needed for safety  Recommendations for other services: Neuropsych and Therapeutic Recreation  Stress management  Skilled Therapeutic Intervention Pt received supine in bed awake and agreeable to therapy session with noticeable R lateral trunk lean in the bed. Evaluation completed (see details above) with patient education regarding purpose of PT evaluation, PT POC and goals, therapy schedule, weekly team meetings, and other CIR information including safety plan and fall risk safety. Pt performed the below functional mobility tasks with the specified levels of skilled cuing and assistance. Pt demos significant L inattention with impaired spatial orientation awareness as well as overall impaired awareness of her deficits. Pt with absent sensation in L hemibody and unaware of  this.   Pt requires step-by-step verbal cuing to assist with motor planning, sequencing, and executing functional mobility tasks.   During squat pivot transfers EOB<>TIS w/c noticed pt holding L LE in full knee extension due to sensory loss and inability to know whether she was putting her foot on ground to power up through and instead activating quads causing open-chain extension.  Participated in gait training at R hallway rail 83ft x2 with +2 w/c follow requiring skilled mod/max A for balance and L LE management due to sensory ataxia and varying motor sequencing in L LE  during swing phase (see below).  At end of session, pt left supine in bed in care of NT and nurse for hygiene and med administration.    Mobility Bed Mobility Bed Mobility: Sit to Supine;Supine to Sit Rolling Right: Moderate Assistance - Patient 50-74% Rolling Left: Moderate Assistance - Patient 50-74% Supine to Sit: Moderate Assistance - Patient 50-74%;Maximal Assistance - Patient - Patient 25-49% Sit to Supine: Moderate Assistance - Patient 50-74%;Maximal Assistance - Patient 25-49% Transfers Transfers: Sit to Stand;Stand to Sit;Squat Pivot Transfers Sit to Stand: Maximal Assistance - Patient 25-49% Stand to Sit: Maximal Assistance - Patient 25-49% Squat Pivot Transfers: Maximal Assistance - Patient 25-49% (requires step-by-step cuing for sequencing head/hips relationship, hand placement, and trunk movement - facilitation for technique) Transfer (Assistive device): None Locomotion  Gait Ambulation: Yes Gait Assistance: 2 Helpers;Maximal Assistance - Patient 25-49% (+2 w/c follow) Gait Distance (Feet): 30 Feet (x2) Assistive device: Other (Comment) (R hallway rail) Gait Assistance Details: Verbal cues for technique;Verbal cues for sequencing;Verbal cues for gait pattern;Verbal cues for safe use of DME/AE;Manual facilitation for weight shifting;Manual facilitation for placement Gait Gait: Yes Gait Pattern:  Impaired Gait Pattern: Poor foot clearance - left;Lateral trunk lean to left;Ataxic;Scissoring;Step-to pattern;Decreased step length - right;Decreased step length - left;Decreased stance time - left (slow, discordinated gait pattern with sensory ataxia in L LE varying from holding L LE in extension to excessively flexing it up during swing phase of gait, consistent L lateral lean throughout, repeated cuing to maintain upright posture, L LE scissors) Gait velocity: decreased Stairs / Additional Locomotion Stairs: No Wheelchair Mobility Wheelchair Mobility: No (in TIS w/c)   Discharge Criteria: Patient will be discharged from PT if patient refuses treatment 3 consecutive times without medical reason, if treatment goals not met, if there is a change in medical status, if patient makes no progress towards goals or if patient is discharged from hospital.  The above assessment, treatment plan, treatment alternatives and goals were discussed and mutually agreed upon: by patient  Connell CHRISTELLA Kiss , PT, DPT, NCS, CSRS 02/19/2023, 3:33 PM

## 2023-02-19 NOTE — Plan of Care (Signed)
 Problem: RH Balance Goal: LTG: Patient will maintain dynamic sitting balance (OT) Description: LTG:  Patient will maintain dynamic sitting balance with assistance during activities of daily living (OT) Flowsheets (Taken 02/19/2023 1602) LTG: Pt will maintain dynamic sitting balance during ADLs with: Supervision/Verbal cueing Goal: LTG Patient will maintain dynamic standing with ADLs (OT) Description: LTG:  Patient will maintain dynamic standing balance with assist during activities of daily living (OT)  Flowsheets (Taken 02/19/2023 1602) LTG: Pt will maintain dynamic standing balance during ADLs with: Minimal Assistance - Patient > 75%   Problem: Sit to Stand Goal: LTG:  Patient will perform sit to stand in prep for activites of daily living with assistance level (OT) Description: LTG:  Patient will perform sit to stand in prep for activites of daily living with assistance level (OT) Flowsheets (Taken 02/19/2023 1602) LTG: PT will perform sit to stand in prep for activites of daily living with assistance level: Contact Guard/Touching assist   Problem: RH Eating Goal: LTG Patient will perform eating w/assist, cues/equip (OT) Description: LTG: Patient will perform eating with assist, with/without cues using equipment (OT) Flowsheets (Taken 02/19/2023 1602) LTG: Pt will perform eating with assistance level of: Minimal Assistance - Patient > 75%   Problem: RH Grooming Goal: LTG Patient will perform grooming w/assist,cues/equip (OT) Description: LTG: Patient will perform grooming with assist, with/without cues using equipment (OT) Flowsheets (Taken 02/19/2023 1602) LTG: Pt will perform grooming with assistance level of: Minimal Assistance - Patient > 75%   Problem: RH Bathing Goal: LTG Patient will bathe all body parts with assist levels (OT) Description: LTG: Patient will bathe all body parts with assist levels (OT) Flowsheets (Taken 02/19/2023 1602) LTG: Pt will perform bathing with  assistance level/cueing: Minimal Assistance - Patient > 75%   Problem: RH Dressing Goal: LTG Patient will perform upper body dressing (OT) Description: LTG Patient will perform upper body dressing with assist, with/without cues (OT). Flowsheets (Taken 02/19/2023 1602) LTG: Pt will perform upper body dressing with assistance level of: Minimal Assistance - Patient > 75%   Problem: RH Toileting Goal: LTG Patient will perform toileting task (3/3 steps) with assistance level (OT) Description: LTG: Patient will perform toileting task (3/3 steps) with assistance level (OT)  Flowsheets (Taken 02/19/2023 1602) LTG: Pt will perform toileting task (3/3 steps) with assistance level: Minimal Assistance - Patient > 75%   Problem: RH Vision Goal: RH LTG Vision Consulting Civil Engineer) Flowsheets (Taken 02/19/2023 1602) LTG: Vision Goals: Pt will utilizes compensatory visual scanning techniques during BADLs to locate 5/5 items mod I.   Problem: RH Functional Use of Upper Extremity Goal: LTG Patient will use RT/LT upper extremity as a (OT) Description: LTG: Patient will use right/left upper extremity as a stabilizer/gross assist/diminished/nondominant/dominant level with assist, with/without cues during functional activity (OT) Flowsheets (Taken 02/19/2023 1602) LTG: Use of upper extremity in functional activities: LUE as gross assist level LTG: Pt will use upper extremity in functional activity with assistance level of: Contact Guard/Touching assist   Problem: RH Toilet Transfers Goal: LTG Patient will perform toilet transfers w/assist (OT) Description: LTG: Patient will perform toilet transfers with assist, with/without cues using equipment (OT) Flowsheets (Taken 02/19/2023 1602) LTG: Pt will perform toilet transfers with assistance level of: Contact Guard/Touching assist   Problem: RH Tub/Shower Transfers Goal: LTG Patient will perform tub/shower transfers w/assist (OT) Description: LTG: Patient will perform  tub/shower transfers with assist, with/without cues using equipment (OT) Flowsheets (Taken 02/19/2023 1602) LTG: Pt will perform tub/shower stall transfers with assistance level of:  Minimal Assistance - Patient > 75%   Problem: RH Memory Goal: LTG Patient will demonstrate ability for day to day recall/carry over during activities of daily living with assistance level (OT) Description: LTG:  Patient will demonstrate ability for day to day recall/carry over during activities of daily living with assistance level (OT). Flowsheets (Taken 02/19/2023 1602) LTG:  Patient will demonstrate ability for day to day recall/carry over during activities of daily living with assistance level (OT): Minimal Assistance - Patient > 75%

## 2023-02-19 NOTE — Telephone Encounter (Signed)
 Patient Product/process development scientist completed.    The patient is insured through [employer]. Patient has Medicare and is not eligible for a copay card, but may be able to apply for patient assistance or Medicare RX Payment Plan (Patient Must reach out to their plan, if eligible for payment plan), if available.    Ran test claim for Brilinta 90 mg and the current 30 day co-pay is $297.00 due to a $250.00 deductible.  Will be $47.00 once deductible is met.   This test claim was processed through [facility]- copay amounts may vary at other pharmacies due to pharmacy/plan contracts, or as the patient moves through the different stages of their insurance plan.     Roland Earl, CPHT Pharmacy Technician III Certified Patient Advocate [facility] Pharmacy Patient Advocate Team Direct Number: [phone]  Fax: [phone]

## 2023-02-19 NOTE — Progress Notes (Addendum)
 Inpatient Rehabilitation Admission Medication Review by a Pharmacist  A complete drug regimen review was completed for this patient to identify any potential clinically significant medication issues.  High Risk Drug Classes Is patient taking? Indication by Medication  Antipsychotic No   Anticoagulant Yes Heparin  SQ - VTE prophylaxis  Antibiotic No   Opioid No   Antiplatelet Yes Aspirin  81 mg and ticagrelor  through 05/12/23 then asprin alone  Hypoglycemics/insulin  No   Vasoactive Medication Yes Irbesartan  (for PTA valsartan ) - hypertension  Chemotherapy No   Other Yes Atorvastatin  - hyperlipidemia  PRNs: Acetaminophen  - mild pain Maalox - indigestion Bisacodyl , miralax , Fleets emena - constipation Guaifenesin /dextromethorphan - cough Melatonin - sleep Methocarbamol  - muscle spasms Ondansetron  - nausea, vomiting     Type of Medication Issue Identified Description of Issue Recommendation(s)  Drug Interaction(s) (clinically significant)     Duplicate Therapy     Allergy     No Medication Administration End Date     Incorrect Dose     Additional Drug Therapy Needed     Significant med changes from prior encounter (inform family/care partners about these prior to discharge). Off amlodipine . Lower dose ARB resumed 02/18/13.  Clopidogrel  changed to Ticagrelor . New atorvastatin , aspirin  81 mg. Monitoring blood pressure for any need to adjust medications.  Communicate changes with patient/family prior to discharge.  Other  Per discharge summary, to resume amlodipine . Given x 1 at ~3am on 02/19/23. Irbesartan  begun 1/10 am (at 1/2 dose equivalent of valsartan ).  Off home herbal medications. Monitoring blood pressure for any need to adjust medications.    Address at discharge.    Clinically significant medication issues were identified that warrant physician communication and completion of prescribed/recommended actions by midnight of the next day:  No  Pharmacist comments:   - Aspirin  81 mg and Ticagrelor  x 90 days preferred per Neuro.  Begun 02/12/23.  Time spent performing this drug regimen review (minutes):  20  Genaro Zebedee [place], [place] 02/19/2023 10:42 AM

## 2023-02-19 NOTE — Progress Notes (Signed)
 Resting at this time. CT scan w/o contrast stat ordered via verbal order from on-call provider. One time dose of amLODipine 5 mg ordered by Provider and given. At this time patient is resting in bed with an involuntary smile observed.

## 2023-02-19 NOTE — Evaluation (Signed)
 Speech Language Pathology Assessment and Plan  Patient Details  Name: [facility] MRN: [id number] Date of Birth: 04-05-1935  SLP Diagnosis: Dysphagia;Cognitive Impairments  Rehab Potential: Good ELOS: 18-21 days    Today's Date: 02/19/2023 SLP Individual Time: 0930-1030 SLP Individual Time Calculation (min): 60 min   Hospital Problem: Active Problems:   CVA (cerebral vascular accident) [facility])  Past Medical History:  Past Medical History:  Diagnosis Date   Acute CVA (cerebrovascular accident) ([facility]) 01/13/2014   Acute renal failure (ARF) ([facility]) 05/06/2018   Arthritis    Bilateral carotid artery occlusion 02/12/2014   Cerebral infarction ([facility]) 02/12/2014   IMO SNOMED Dx Update Oct 2024     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 ([facility])    CVA (cerebral vascular accident) ([facility]) 11/12/2022   Diabetes mellitus type 2, uncontrolled, 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   H/O detached retina repair    Hypertension    TIA (transient ischemic attack)    Past Surgical History:  Past Surgical History:  Procedure Laterality Date   ENDARTERECTOMY Left 01/19/2014   Procedure: ENDARTERECTOMY CAROTID-LEFT;  Surgeon: [facility] LELON New, MD;  Location: [facility];  Service: Vascular;  Laterality: Left;   EYE SURGERY     lens removed from right eye   PATCH ANGIOPLASTY Left 01/19/2014   Procedure: PATCH ANGIOPLASTY, LEFT CAROTID ARTERY USING HEMASHIELD PLATINUM FINESSE PATCH;  Surgeon: Kathleen LELON New, MD;  Location: [facility];  Service: Vascular;  Laterality: Left;   TONSILLECTOMY      Assessment / Plan / Recommendation Clinical Impression HPI: Pt is an 88 y/o female with PMH of CVA, HTN, DM, PAD who presented to [facility] with left sided weakness and visual deficits. CT head revealed large acute/subacute right PCA infarct with hemorrhage. CTA showed occluded proximal right PCA and occluded intradural right vertebral artery with severe left P2 stenosis and severe left vertebral  artery origin stenosis. MRI showed large acute right PCA territory infarct with petechial hemorrhage and edema but no significant mass effect.   Clinical Impression:  Bedside Swallow Evaluation: A bedside swallow evaluation was completed to assess for s/sx of oropharyngeal dysphagia. Oral mechanism exam functional, though observed poor fitting dentures. POs included thin liquids and solids from breakfast tray. During consumption of solids, patient with prolonged mastication and multiple swallows (? Due to dentures). Mild oral residuals, though cleared with liquid wash and/or subsequent swallows. Patient with occasional delayed throat clear and hard swallows which may indicate bolus misdirection. Recommend D3/thin liquid diet with pills whole in puree (patient reports occasional difficulty in water) as patient has been tolerating diet throughout inpatient stay. Continue to evaluate current diet and possible indications for MBS. Recommend total A during meals due to physical need. Encourage use of standardized swallowing precautions (small bites/sips, slow rate, upright at 90 degrees). Communication: Receptive and expressive language WFL. Cognition: The Cognistat was administered to assess cognitive-linguistic functioning. Patient scored WFL on all subtests with the exception of moderate deficits in memory. Of note, confrontational naming and visual - spatial tasks unable to be assess due to patients visual deficits. During evaluation, observed moderate L inattention though patient aware of deficit. Recommend targeting L attention, memory, and problem solving during inpatient stay as patient with high level of cognitive load prior to admission.  Dysarthria: Patient is 100% intelligible at the conversational level with occasional instances of imprecise articulation. Patient is aware.  Pt would benefit from skilled ST services to maximize dysphagia and cognition in order to maximize functional  independence at  d/c. Anticipate patient will require 24 hour supervision at d/c and f/u SLP services.    Skilled Therapeutic Interventions          Patient evaluated using a non-standardized cognitive linguistic assessment and bedside swallow evaluation to assess current cognitive, communicative and swallowing function. See above for details.    SLP Assessment  Patient will need skilled Speech Lanaguage Pathology Services during CIR admission    Recommendations  SLP Diet Recommendations: Dysphagia 3 (Mech soft);Thin Liquid Administration via: Cup Supervision: Staff to assist with self feeding Compensations: Minimize environmental distractions;Slow rate;Small sips/bites Postural Changes and/or Swallow Maneuvers: Seated upright 90 degrees Oral Care Recommendations: Oral care BID Patient destination: Home Follow up Recommendations: Home Health SLP;Outpatient SLP;24 hour supervision/assistance Equipment Recommended: None recommended by SLP    SLP Frequency 3 to 5 out of 7 days   SLP Duration  SLP Intensity  SLP Treatment/Interventions 18-21 days  Minumum of 1-2 x/day, 30 to 90 minutes  Cognitive remediation/compensation;Dysphagia/aspiration precaution training;Internal/external aids;Cueing hierarchy;Therapeutic Activities;Functional tasks;Patient/family education;Therapeutic Exercise    Pain None reported   SLP Evaluation Cognition Overall Cognitive Status: Impaired/Different from baseline Arousal/Alertness: Awake/alert Orientation Level: Oriented X4 Year: 2025 Month: January Day of Week: Correct Attention: Sustained Sustained Attention: Impaired Sustained Attention Impairment: Verbal basic;Functional basic Memory: Impaired Memory Impairment: Retrieval deficit;Storage deficit Awareness: Appears intact Problem Solving: Impaired Problem Solving Impairment: Verbal basic;Functional basic  Comprehension Auditory Comprehension Overall Auditory Comprehension: Appears within functional limits  for tasks assessed Expression Expression Primary Mode of Expression: Verbal Verbal Expression Overall Verbal Expression: Appears within functional limits for tasks assessed Oral Motor Oral Motor/Sensory Function Overall Oral Motor/Sensory Function: Within functional limits Motor Speech Overall Motor Speech: Appears within functional limits for tasks assessed  Care Tool Care Tool Cognition Ability to hear (with hearing aid or hearing appliances if normally used Ability to hear (with hearing aid or hearing appliances if normally used): 2. Moderate difficulty - speaker has to increase volume and speak distinctly   Expression of Ideas and Wants Expression of Ideas and Wants: 3. Some difficulty - exhibits some difficulty with expressing needs and ideas (e.g, some words or finishing thoughts) or speech is not clear   Understanding Verbal and Non-Verbal Content Understanding Verbal and Non-Verbal Content: 3. Usually understands - understands most conversations, but misses some part/intent of message. Requires cues at times to understand  Memory/Recall Ability Memory/Recall Ability : That he or she is in a hospital/hospital unit;Current season   Bedside Swallowing Assessment General Previous Swallow Assessment: none Diet Prior to this Study: Regular;Thin liquids (Level 0) Respiratory Status: Room air Behavior/Cognition: Alert;Cooperative Oral Cavity - Dentition: Dentures, top Self-Feeding Abilities: Total assist (vision and attention deficits) Patient Positioning: Upright in bed Baseline Vocal Quality: Normal Volitional Cough: Strong Volitional Swallow: Able to elicit  Ice Chips Ice chips: Not tested Thin Liquid Thin Liquid: Impaired Presentation: Straw;Cup Pharyngeal  Phase Impairments: Throat Clearing - Delayed;Multiple swallows Nectar Thick Nectar Thick Liquid: Not tested Honey Thick Honey Thick Liquid: Not tested Puree Puree: Not tested Solid Solid: Impaired Presentation:  Spoon Oral Phase Impairments: Impaired mastication Oral Phase Functional Implications: Impaired mastication Pharyngeal Phase Impairments: Multiple swallows BSE Assessment Risk for Aspiration Impact on safety and function: Mild aspiration risk  Short Term Goals: Week 1: SLP Short Term Goal 1 (Week 1): Patient will utilize swallowing compensatory strategies to reduce s/sx of aspiration during consumption of PO given mod multimodal A SLP Short Term Goal 2 (Week 1): Patient will demonstrate problem solving in basic  daily situations given mod multimodal A SLP Short Term Goal 3 (Week 1): Patient will recall and utilize memory compensatory strategies given mod multimodal A SLP Short Term Goal 4 (Week 1): Patient will attend to L side during functional tasks given mod multimodal A  Refer to Care Plan for Long Term Goals  Recommendations for other services: None   Discharge Criteria: Patient will be discharged from SLP if patient refuses treatment 3 consecutive times without medical reason, if treatment goals not met, if there is a change in medical status, if patient makes no progress towards goals or if patient is discharged from hospital.  The above assessment, treatment plan, treatment alternatives and goals were discussed and mutually agreed upon: by patient  Offie Pickron M.A., CF-SLP 02/19/2023, 12:39 PM

## 2023-02-20 DIAGNOSIS — E1169 Type 2 diabetes mellitus with other specified complication: Secondary | ICD-10-CM

## 2023-02-20 DIAGNOSIS — I10 Essential (primary) hypertension: Secondary | ICD-10-CM | POA: Diagnosis not present

## 2023-02-20 DIAGNOSIS — E119 Type 2 diabetes mellitus without complications: Secondary | ICD-10-CM

## 2023-02-20 DIAGNOSIS — N182 Chronic kidney disease, stage 2 (mild): Secondary | ICD-10-CM

## 2023-02-20 DIAGNOSIS — I63531 Cerebral infarction due to unspecified occlusion or stenosis of right posterior cerebral artery: Secondary | ICD-10-CM | POA: Diagnosis not present

## 2023-02-20 NOTE — Progress Notes (Addendum)
 PROGRESS NOTE   Subjective/Complaints: No acute events overnight noted.  Patient reports she has been feeling strange and disconnected since her CVA.  ROS- denies fever, CP, SOB, abdominal pain, new motor or sensory changes (suspect poor awareness of deficits)  Objective:   CT HEAD WO CONTRAST ( ) Result Date: 02/19/2023 CLINICAL DATA:  88 year old female status post trauma, fall. Right PCA infarct. Right PCA occlusion. EXAM: CT HEAD WITHOUT CONTRAST TECHNIQUE: Contiguous axial images were obtained from the base of the skull through the vertex without intravenous contrast. RADIATION DOSE REDUCTION: This exam was performed according to the departmental dose-optimization program which includes automated exposure control, adjustment of the mA and/or kV according to patient size and/or use of iterative reconstruction technique. COMPARISON:  Head CT and brain MRI 02/11/2023. FINDINGS: Brain: Heterogeneous cytotoxic edema in the right PCA territory including the right thalamus with evolution since 02/11/2023. Continued mild regional mass effect. Petechial hemorrhage as seen by MRI, but no malignant hemorrhagic transformation. No extension compared to the previous DWI. Superimposed chronic infarcts with encephalomalacia in the posterior left MCA and left MCA/PCA watershed territories. Chronic lacunar infarcts of the bilateral deep gray nuclei, genu of the corpus callosum, and pons. Confluent additional bilateral cerebral white matter hypodensity. No midline shift or significant intracranial mass effect. Stable ventricles. Vascular: Stable.  Calcified atherosclerosis at the skull base. Skull: Stable and intact. Sinuses/Orbits: Visualized paranasal sinuses and mastoids are stable and well aerated. Other: No acute orbit or scalp soft tissue finding. Postoperative changes to both globes. IMPRESSION: 1. Expected evolution of large Right PCA territory  infarct since 02/11/2023. Petechial hemorrhage but no malignant hemorrhagic transformation. No significant intracranial mass effect. 2. Underlying advanced chronic ischemic disease. 3. No new intracranial abnormality. Electronically Signed   By: VEAR Hurst M.D.   On: 02/19/2023 04:49   No results for input(s): WBC, HGB, HCT, PLT in the last 72 hours. Recent Labs    02/19/23 0941  NA 138  K 3.8  CL 100  CO2 27  GLUCOSE 163*  BUN 13  CREATININE 1.20*  CALCIUM  8.9    Intake/Output Summary (Last 24 hours) at 02/20/2023 1545 Last data filed at 02/20/2023 1200 Gross per 24 hour  Intake 438 ml  Output --  Net 438 ml        Physical Exam: Vital Signs Blood pressure 131/68, pulse 69, temperature 98.1 F (36.7 C), temperature source Oral, resp. rate 17, height 5' 6 (1.676 m), weight 61.9 kg, SpO2 92%.   General: No acute distress, laying in bed Affect appears a little flat Heart: Regular rate and rhythm no rubs murmurs or extra sounds Lungs: Clear to auscultation, breathing unlabored, no rales or wheezes Abdomen: Positive bowel sounds, soft nontender to palpation, nondistended Extremities: No clubbing, cyanosis, or edema Skin: No evidence of breakdown, no evidence of rash Neurologic: Alert and oriented x 4  Moving all 4 extremities to gravity and resistance Left upper and lower extremity ataxia Musculoskeletal: Full range of motion in all 4 extremities. No joint swelling   Prior exam motor strength is 5/5 in right deltoid, bicep, tricep, grip, hip flexor, knee extensors, ankle dorsiflexor and plantar flexor Left side difficult to examine  due to severe sensory ataxia Wandering LUE movements  Sensory exam absent sensation to light touch and proprioception in LEFT upper and lower extremities Cerebellar exam severe sensory ataxia LUE and LLE    Assessment/Plan: 1. Functional deficits which require 3+ hours per day of interdisciplinary therapy in a comprehensive inpatient  rehab setting. Physiatrist is providing close team supervision and 24 hour management of active medical problems listed below. Physiatrist and rehab team continue to assess barriers to discharge/monitor patient progress toward functional and medical goals  Care Tool:  Bathing    Body parts bathed by patient: Face, Front perineal area, Left arm, Chest   Body parts bathed by helper: Buttocks, Right upper leg, Left upper leg, Left lower leg, Right lower leg, Front perineal area, Right arm, Left arm, Abdomen     Bathing assist Assist Level: Maximal Assistance - Patient 24 - 49%     Upper Body Dressing/Undressing Upper body dressing   What is the patient wearing?: Pull over shirt    Upper body assist Assist Level: Total Assistance - Patient < 25%    Lower Body Dressing/Undressing Lower body dressing      What is the patient wearing?: Pants, Underwear/pull up     Lower body assist Assist for lower body dressing: Total Assistance - Patient < 25%     Toileting Toileting    Toileting assist Assist for toileting: Total Assistance - Patient < 25%     Transfers Chair/bed transfer  Transfers assist     Chair/bed transfer assist level: Maximal Assistance - Patient 25 - 49%     Locomotion Ambulation   Ambulation assist   Ambulation activity did not occur: Safety/medical concerns (requires skilled assistance)          Walk 10 feet activity   Assist  Walk 10 feet activity did not occur: Safety/medical concerns        Walk 50 feet activity   Assist Walk 50 feet with 2 turns activity did not occur: Safety/medical concerns         Walk 150 feet activity   Assist Walk 150 feet activity did not occur: Safety/medical concerns         Walk 10 feet on uneven surface  activity   Assist Walk 10 feet on uneven surfaces activity did not occur: Safety/medical concerns         Wheelchair     Assist Is the patient using a wheelchair?: Yes Type of  Wheelchair: Manual (TIS w/c)    Wheelchair assist level: Dependent - Patient 0%      Wheelchair 50 feet with 2 turns activity    Assist        Assist Level: Dependent - Patient 0%   Wheelchair 150 feet activity     Assist      Assist Level: Dependent - Patient 0%   Blood pressure 131/68, pulse 69, temperature 98.1 F (36.7 C), temperature source Oral, resp. rate 17, height 5' 6 (1.676 m), weight 61.9 kg, SpO2 92%.  Medical Problem List and Plan: 1. Functional deficits secondary to ischemic infarct right PCA territory ( Right temporal infarct 11/12/22, old Left MCA infarct 2015)              -patient may shower             -ELOS/Goals: 18-21 days, Supervision to Min A OT/PT/SLP             Large PCA infarct with severe sensory deficits on left alien  arm on left, will have limited progress unless sensory deficits improve   -CT head 02/10/2023-expected evolution of CVA   2.  Antithrombotics: -DVT/anticoagulation:  Pharmaceutical: Heparin              -antiplatelet therapy: Aspirin  and Brilinta  for three 90 days followed by aspirin  alone  hx left CEA 3. Pain Management: Tylenol  as needed   4. Mood/Behavior/Sleep: LCSW to evaluate and provide emotional support             -antipsychotic agents: n/a              - Melatonin PRN   5. Neuropsych/cognition: This patient is not quite capable of making decisions on her own behalf.   6. Skin/Wound Care: Routine skin care checks   7. Fluids/Electrolytes/Nutrition: Routine Is and Os and follow-up chemistries   8: Hypertension: monitor TID and prn (Diovan  320 mg daily at home not restarted) Vitals:   02/20/23 0506 02/20/23 1410  BP: (!) 156/72 131/68  Pulse: 70 69  Resp: 17 17  Temp: 98.6 F (37 C) 98.1 F (36.7 C)  SpO2: 98% 92%   1/11 BP doing better, was restarted on irbesartan  150 yesterday, continue to monitor   9: Hyperlipidemia: continue statin   10: DM-2: A1c = 6.4% on October, 2024 (no home meds)   -Continue to monitor on BMPs CBG (last 3)  Recent Labs    02/19/23 0230 02/19/23 0703  GLUCAP 119* 122*     11: Elevated serum creatinine/GFR ~55: ? Mild CKD; now at baseline ~1.0             -1/11 creatinine yesterday 1.20, encourage continued fluid intake.  Recheck Monday      Latest Ref Rng & Units 02/19/2023    9:41 AM 02/14/2023    6:31 AM 02/13/2023    6:51 PM  BMP  Glucose 70 - 99 mg/dL 836  864  848   BUN 8 - 23 mg/dL 13  13  18    Creatinine 0.44 - 1.00 mg/dL 8.79  8.98  8.80   Sodium 135 - 145 mmol/L 138  136  135   Potassium 3.5 - 5.1 mmol/L 3.8  3.5  3.9   Chloride 98 - 111 mmol/L 100  101  99   CO2 22 - 32 mmol/L 27  25  22    Calcium  8.9 - 10.3 mg/dL 8.9  8.3  9.0    Restart ARB , medium dose Avapro  150mg  daily , (home dose diovan  320mg  every day) may need to titrate up  Rapid response team gave amlodipine  5mg  last noc 1/10   LOS: 2 days A FACE TO FACE EVALUATION WAS PERFORMED  Murray Collier 02/20/2023, 3:45 PM

## 2023-02-20 NOTE — Progress Notes (Signed)
 Speech Language Pathology Daily Session Note  Patient Details  Name: Thessaly Mccullers MRN: [id number] Date of Birth: 08-31-1935  Today's Date: 02/20/2023 SLP Individual Time: 1300-1345 SLP Individual Time Calculation (min): 45 min  Short Term Goals: Week 1: SLP Short Term Goal 1 (Week 1): Patient will utilize swallowing compensatory strategies to reduce s/sx of aspiration during consumption of PO given mod multimodal A SLP Short Term Goal 2 (Week 1): Patient will demonstrate problem solving in basic daily situations given mod multimodal A SLP Short Term Goal 3 (Week 1): Patient will recall and utilize memory compensatory strategies given mod multimodal A SLP Short Term Goal 4 (Week 1): Patient will attend to L side during functional tasks given mod multimodal A  Skilled Therapeutic Interventions: Upon entrance, NT reporting patient with significant difficulty masticating D3 textures during lunch. SLP therefore continued trials of PO during session. SLP offered POs of thin liquids via straw and D2 solids. Patient with x1 delayed throat clear during solids and x1 delayed cough after liquid which may be indicative of bolus misdirection. Of note, patient with occasional throat clear throughout session, therefore this may be baseline. Patient with more timely mastication and mild oral residuals during consumption of D2 textures compared to D3. Recommend D2/thin liquids with plan to complete MBS this week. Patient should recieve full supervision and prompting for compensatory strategies such as sitting upright, small bites/sips, and slow rate. After completion of PO trials, patient reported episode of incontinence. SLP and NT transferred patient to bed and provided peri-care. Patient followed single step directions independently. Patient left in bed with alarm set and call bell in reach. Continue POC  Pain None reported   Therapy/Group: Individual Therapy  Venise Ellingwood M.A., CF-SLP 02/20/2023, 7:28  AM

## 2023-02-21 DIAGNOSIS — I63531 Cerebral infarction due to unspecified occlusion or stenosis of right posterior cerebral artery: Secondary | ICD-10-CM | POA: Diagnosis not present

## 2023-02-21 DIAGNOSIS — N182 Chronic kidney disease, stage 2 (mild): Secondary | ICD-10-CM | POA: Diagnosis not present

## 2023-02-21 DIAGNOSIS — Z794 Long term (current) use of insulin: Secondary | ICD-10-CM

## 2023-02-21 DIAGNOSIS — I10 Essential (primary) hypertension: Secondary | ICD-10-CM | POA: Diagnosis not present

## 2023-02-21 DIAGNOSIS — E1169 Type 2 diabetes mellitus with other specified complication: Secondary | ICD-10-CM | POA: Diagnosis not present

## 2023-02-21 DIAGNOSIS — I63532 Cerebral infarction due to unspecified occlusion or stenosis of left posterior cerebral artery: Secondary | ICD-10-CM

## 2023-02-21 MED ORDER — POLYVINYL ALCOHOL 1.4 % OP SOLN: 2.0000 [drp] | OPHTHALMIC | Status: DC | PRN

## 2023-02-21 NOTE — IPOC Note (Addendum)
 Overall Plan of Care [facility]) Patient Details Name: Kathleen Figueroa MRN: [id number] DOB: March 22, 1935  Admitting Diagnosis: right posterior cerebral artery infarct   Hospital Problems: Active Problems:   CVA (cerebral vascular accident) (HCC)   Stage 2 chronic kidney disease   Diabetes mellitus (HCC)   Primary hypertension     Functional Problem List: Nursing Bladder, Bowel, Medication Management, Safety, Nutrition, Endurance  PT Balance, Safety, Sensory, Skin Integrity, Endurance, Motor, Nutrition, Pain, Perception  OT Balance, Perception, Cognition, Sensory, Safety, Edema, Skin Integrity, Endurance, Vision, Motor, Pain  SLP Cognition, Nutrition  TR         Basic ADL's: OT Grooming, Bathing, Dressing, Toileting, Eating     Advanced  ADL's: OT       Transfers: PT Bed Mobility, Bed to Chair, Car, Lobbyist, Technical Brewer: PT Ambulation, Psychologist, [employer], Stairs     Additional Impairments: OT Fuctional Use of Upper Extremity  SLP Swallowing, Social Cognition   Problem Solving, Memory, Attention  TR      Anticipated Outcomes Item Anticipated Outcome  Self Feeding Min A  Swallowing  supervision A   Basic self-care  Min A  Toileting  Min A   Bathroom Transfers CGA-min A  Bowel/Bladder  manage with MOD I  Transfers  CGA using LRAD  Locomotion  skilled min A using LRAD  Communication     Cognition  supervision A  Pain  n/a  Safety/Judgment  manage with cues   Therapy Plan: PT Intensity: Minimum of 1-2 x/day ,45 to 90 minutes PT Frequency: 5 out of 7 days PT Duration Estimated Length of Stay: 3.5 weeks OT Intensity: Minimum of 1-2 x/day, 45 to 90 minutes OT Frequency: 5 out of 7 days OT Duration/Estimated Length of Stay: 3-4 weeks SLP Intensity: Minumum of 1-2 x/day, 30 to 90 minutes SLP Frequency: 3 to 5 out of 7 days SLP Duration/Estimated Length of Stay: 18-21 days   Team Interventions: Nursing Interventions  Patient/Family Education, Bowel Management, Disease Management/Prevention, Medication Management, Discharge Planning  PT interventions Ambulation/gait training, Community reintegration, DME/adaptive equipment instruction, Neuromuscular re-education, Psychosocial support, Stair training, UE/LE Strength taining/ROM, Wheelchair propulsion/positioning, Warden/ranger, Discharge planning, Functional electrical stimulation, Pain management, Skin care/wound management, Therapeutic Activities, UE/LE Coordination activities, Disease management/prevention, Cognitive remediation/compensation, Functional mobility training, Patient/family education, Splinting/orthotics, Therapeutic Exercise, Visual/perceptual remediation/compensation  OT Interventions Balance/vestibular training, Discharge planning, Functional electrical stimulation, Pain management, Therapeutic Activities, Self Care/advanced ADL retraining, UE/LE Coordination activities, Cognitive remediation/compensation, Disease mangement/prevention, Functional mobility training, Skin care/wound managment, Patient/family education, Community reintegration, Fish Farm Manager, Neuromuscular re-education, Psychosocial support, Splinting/orthotics, UE/LE Strength taining/ROM, Therapeutic Exercise, Visual/perceptual remediation/compensation, Wheelchair propulsion/positioning  SLP Interventions Cognitive remediation/compensation, Dysphagia/aspiration precaution training, Internal/external aids, Cueing hierarchy, Therapeutic Activities, Functional tasks, Patient/family education, Therapeutic Exercise  TR Interventions    SW/CM Interventions Discharge Planning, Psychosocial Support, Patient/Family Education   Barriers to Discharge MD  Medical stability  Nursing Incontinence one level, leve entry, with daughter  PT Decreased caregiver support, Neurogenic Bowel & Bladder, Incontinence, Lack of/limited family support    OT      SLP      SW  Decreased caregiver support, Community Education Officer for SNF coverage     Team Discharge Planning: Destination: PT-Home ,OT- Home , SLP-Home Projected Follow-up: PT-24 hour supervision/assistance, Home health PT, OT-  Home health OT, SLP-Home Health SLP, Outpatient SLP, 24 hour supervision/assistance Projected Equipment Needs: PT-To be determined, OT- To be determined, SLP-None recommended by SLP Equipment Details: PT- , OT-Anticipate pt may  need TTB and BSC Patient/family involved in discharge planning: PT- Patient,  OT-Patient, SLP-Patient  MD ELOS: 3-4 weeks Medical Rehab Prognosis:  Good Assessment: The patient has been admitted for CIR therapies with the diagnosis of ischemic infarct right PCA territory . The team will be addressing functional mobility, strength, stamina, balance, safety, adaptive techniques and equipment, self-care, bowel and bladder mgt, patient and caregiver education. Goals have been set at min A. Anticipated discharge destination is home.       See Team Conference Notes for weekly updates to the plan of care

## 2023-02-21 NOTE — Progress Notes (Addendum)
 PROGRESS NOTE   Subjective/Complaints: No acute events noted overnight.  She continues to report feeling disconnected from her body since her CVA.  On discussing it further it sounds like she may be referring to her sensory abnormalities.  She has noted to have some left eye redness by nursing.  She denies any eye pain.  ROS- denies fever, chills, CP, SOB, abdominal pain, new motor or sensory changes (suspect poor awareness of deficits)  Objective:   No results found.  No results for input(s): WBC, HGB, HCT, PLT in the last 72 hours. Recent Labs    02/19/23 0941  NA 138  K 3.8  CL 100  CO2 27  GLUCOSE 163*  BUN 13  CREATININE 1.20*  CALCIUM  8.9    Intake/Output Summary (Last 24 hours) at 02/21/2023 1345 Last data filed at 02/21/2023 1249 Gross per 24 hour  Intake 597 ml  Output --  Net 597 ml        Physical Exam: Vital Signs Blood pressure 133/61, pulse 72, temperature 98.1 F (36.7 C), resp. rate 18, height 5' 6 (1.676 m), weight 61.9 kg, SpO2 100%.   General: No acute distress, sitting in wheelchair working with therapy Affect appears a little flat HEENT: Minimal conjunctival redness, no drainage noted Heart: Regular rate and rhythm no rubs murmurs or extra sounds Lungs: Clear to auscultation, breathing unlabored, no rales or wheezes Abdomen: Positive bowel sounds, soft nontender to palpation, nondistended Extremities: No clubbing, cyanosis, or edema Skin: No evidence of breakdown, no evidence of rash Neurologic: Alert and oriented x 4  Moving all 4 extremities to gravity and resistance Left upper and lower extremity ataxia Left hemineglect Absent sensation to light touch left upper and lower extremities Musculoskeletal: Full range of motion in all 4 extremities. No joint swelling    Prior exam motor strength is 5/5 in right deltoid, bicep, tricep, grip, hip flexor, knee extensors, ankle  dorsiflexor and plantar flexor Left side difficult to examine due to severe sensory ataxia Wandering LUE movements  Sensory exam absent sensation to light touch and proprioception in LEFT upper and lower extremities  Cerebellar exam severe sensory ataxia LUE and LLE    Assessment/Plan: 1. Functional deficits which require 3+ hours per day of interdisciplinary therapy in a comprehensive inpatient rehab setting. Physiatrist is providing close team supervision and 24 hour management of active medical problems listed below. Physiatrist and rehab team continue to assess barriers to discharge/monitor patient progress toward functional and medical goals  Care Tool:  Bathing    Body parts bathed by patient: Face, Front perineal area, Left arm, Chest   Body parts bathed by helper: Buttocks, Right upper leg, Left upper leg, Left lower leg, Right lower leg, Front perineal area, Right arm, Left arm, Abdomen     Bathing assist Assist Level: Maximal Assistance - Patient 24 - 49%     Upper Body Dressing/Undressing Upper body dressing   What is the patient wearing?: Pull over shirt    Upper body assist Assist Level: Total Assistance - Patient < 25%    Lower Body Dressing/Undressing Lower body dressing      What is the patient wearing?: Pants, Underwear/pull up  Lower body assist Assist for lower body dressing: Total Assistance - Patient < 25%     Toileting Toileting    Toileting assist Assist for toileting: Total Assistance - Patient < 25%     Transfers Chair/bed transfer  Transfers assist     Chair/bed transfer assist level: Dependent - mechanical lift (stedy)     Locomotion Ambulation   Ambulation assist   Ambulation activity did not occur: Safety/medical concerns (requires skilled assistance)          Walk 10 feet activity   Assist  Walk 10 feet activity did not occur: Safety/medical concerns        Walk 50 feet activity   Assist Walk 50 feet with  2 turns activity did not occur: Safety/medical concerns         Walk 150 feet activity   Assist Walk 150 feet activity did not occur: Safety/medical concerns         Walk 10 feet on uneven surface  activity   Assist Walk 10 feet on uneven surfaces activity did not occur: Safety/medical concerns         Wheelchair     Assist Is the patient using a wheelchair?: Yes Type of Wheelchair: Manual (TIS w/c)    Wheelchair assist level: Dependent - Patient 0%      Wheelchair 50 feet with 2 turns activity    Assist        Assist Level: Dependent - Patient 0%   Wheelchair 150 feet activity     Assist      Assist Level: Dependent - Patient 0%   Blood pressure 133/61, pulse 72, temperature 98.1 F (36.7 C), resp. rate 18, height 5' 6 (1.676 m), weight 61.9 kg, SpO2 100%.  Medical Problem List and Plan: 1. 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  Antithrombotics: -DVT/anticoagulation:  Pharmaceutical: Heparin              -antiplatelet therapy: Aspirin  and Brilinta  for three 90 days followed by aspirin  alone  hx left CEA 3. Pain Management: Tylenol  as needed   4. Mood/Behavior/Sleep: LCSW to evaluate and provide emotional support             -antipsychotic agents: n/a              - Melatonin PRN   5. Neuropsych/cognition: This patient is not quite capable of making decisions on her own behalf.   6. Skin/Wound Care: Routine skin care checks   7. Fluids/Electrolytes/Nutrition: Routine Is and Os and follow-up chemistries   8: Hypertension:  monitor TID and prn (Diovan  320 mg daily at home not restarted) Vitals:   02/21/23 0441 02/21/23 0906  BP: (!) 170/90 133/61  Pulse: 73 72  Resp: 18   Temp: 98.1 F (36.7 C)   SpO2: 100%    1/11 BP doing better, was restarted on irbesartan  150 yesterday, continue to monitor 1/12 intermittently elevated but overall better than  prior days.  Continue current regimen and monitor trend for now   9: Hyperlipidemia: continue statin   10: DM-2: A1c = 6.4% on October, 2024 (no home meds)  -Continue to monitor on BMP tomorrow CBG (last 3)  Recent Labs    02/19/23 0230 02/19/23 0703  GLUCAP 119* 122*     11: Elevated serum creatinine/GFR ~55: ? Mild CKD; now at baseline ~1.0             -1/11 creatinine yesterday 1.20, encourage continued fluid intake.   Recheck tomorrow      Latest Ref Rng & Units 02/19/2023    9:41 AM 02/14/2023    6:31 AM 02/13/2023    6:51 PM  BMP  Glucose 70 - 99 mg/dL 836  864  848   BUN 8 - 23 mg/dL 13  13  18    Creatinine 0.44 - 1.00 mg/dL 8.79  8.98  8.80   Sodium 135 - 145 mmol/L 138  136  135   Potassium 3.5 - 5.1 mmol/L 3.8  3.5  3.9   Chloride 98 - 111 mmol/L 100  101  99   CO2 22 - 32 mmol/L 27  25  22    Calcium  8.9 - 10.3 mg/dL 8.9  8.3  9.0    Restart ARB , medium dose Avapro  150mg  daily , (home dose diovan  320mg  every day) may need to titrate up  Rapid response team gave amlodipine  5mg  last noc 1/10  12. Eye redness  -Artificial tears as needed ordered, continue to monitor  LOS: 3 days A FACE TO FACE EVALUATION WAS PERFORMED  Murray Collier 02/21/2023, 1:45 PM

## 2023-02-21 NOTE — Progress Notes (Signed)
 Physical Therapy Session Note  Patient Details  Name: Kathleen Figueroa MRN: [id number] Date of Birth: 1936/01/11  Today's Date: 02/21/2023 PT Individual Time: 1005-1100 PT Individual Time Calculation (min): 55 min   Short Term Goals: Week 1:  PT Short Term Goal 1 (Week 1): Pt will perform supine<>sit with min A PT Short Term Goal 2 (Week 1): Pt will perform sit<>stands using LRAD with min A PT Short Term Goal 3 (Week 1): Pt will perform bed<>chair transfers with mod A PT Short Term Goal 4 (Week 1): Pt will ambulate at least 70ft using LRAD with mod A of 1 and +2 assist as needed for safety  Skilled Therapeutic Interventions/Progress Updates:    Pt received sitting in TIS w/c awake and agreeable to therapy session stating she has been working on her vision and not having success.  Transported to/from gym in [employer] w/c for time management and energy conservation. Noticed L eye watering with slight redness during session - MD notified.  Sit>stand TIS w/c>R UE support on hallway rail with skilled min A - continues to require step-by-step cuing for sequencing to improve L hemibody position and midline orientation - cuing for R lateral trunk lean to improve midline, max cuing for proper L foot placement (cued to bring wide and back otherwise adducts it too far) with manual facilitation to correct due to sensory deficits. Progressed to standing next to hallway rail, but without UE support, resulting in stronger L posterior lean, but pt with some proprioceptive awareness of this.  Gait training 41ft x2 using R hallway rail with skilled mod A assist for balance and L LE management - +2 w/c follow. Pt demonstrating the following gait deviations with therapist providing the described cuing and facilitation for improvement:  - continues to have L anterior trunk lean bias requiring cuing to maintain upright posture - continues to have sensory ataxia in L LE, advancing L LE in extended position during swing with  excessive adduction/scissoring requiring manual facilitation to correct; cuing for increased hip/knee flexion and wider BOS with pt then over exaggerating these movement in an incoordinated fashion - step-by-step cuing for sequencing which LE to step  - often step-to pattern leading with L LE but cuing to progress to reciprocal pattern  Block practice squat pivot transfers w/c<>EOM focusing on proper sequencing and set-up of transfer with light to heavy min A with very specific cuing - requires step-by-step cuing for sequencing where to place hands and feet, which direction to lean, and cuing to orient and visually scan to see mat on L side. Noticed the below with cuing: - L LE extends and lifts up off the floor when transferring towards R - conitnues to require step-by-step cuing for head/hips relationship - max cuing for improved L foot positioning with pt having lack of sensation and poor ability to visually compensate to see her L foot placement - keeps L arm guarded position throughout transfers (unable to functionally use it to assist with transfers)  Transported back to room and hand-off to OT.   Therapy Documentation Precautions:  Precautions Precautions: Fall, Other (comment) Precaution Comments: L hemiparesis, L visual field cut, L inattention, L hemisensory loss Restrictions Weight Bearing Restrictions Per Provider Order: No   Pain:  Denies pain during session.    Therapy/Group: Individual Therapy  Connell CHRISTELLA Kiss , PT, DPT, NCS, CSRS 02/21/2023, 8:55 AM

## 2023-02-21 NOTE — Plan of Care (Signed)
  Problem: Consults Goal: RH STROKE PATIENT EDUCATION Description: See Patient Education module for education specifics  Outcome: Progressing   Problem: RH KNOWLEDGE DEFICIT Goal: RH STG INCREASE KNOWLEDGE OF STROKE PROPHYLAXIS Description: Patient and daughter will be able to manage independently with educational resources provided Outcome: Progressing   Problem: RH Vision Goal: RH LTG Vision (Specify) Outcome: Progressing

## 2023-02-21 NOTE — Progress Notes (Signed)
 Physical Therapy Session Note  Patient Details  Name: Kathleen Figueroa MRN: [id number] Date of Birth: [birth year]  Today's Date: 02/21/2023 PT Individual Time: 0700-0805 PT Individual Time Calculation (min): 65 min   Short Term Goals: Week 1:  PT Short Term Goal 1 (Week 1): Pt will perform supine<>sit with min A PT Short Term Goal 2 (Week 1): Pt will perform sit<>stands using LRAD with min A PT Short Term Goal 3 (Week 1): Pt will perform bed<>chair transfers with mod A PT Short Term Goal 4 (Week 1): Pt will ambulate at least 61ft using LRAD with mod A of 1 and +2 assist as needed for safety  Skilled Therapeutic Interventions/Progress Updates:    Pt presents in bed and agreeable to therapy. Focused on L attention,  functional bed mobility to the L side, NMR during sit <> stands with [facility] for toileting and lower body dressing. Pt performed several sit <> stands throughout with focus on activation on L side, postural control retraining, and max cues for L side of body management. Pt with no awareness of LUE positioning and placement. Unable to visually find L side either. Pt with incontinent BM and then finished BM on toilet. Total assist for hygiene - pt attempted initially but difficulty with full cleaning and motor planning. Pt then incontinent of urine and required washing by PT again for cleaniness and clean up (pt was standing in stedy when this occurred). Several more sit <> stands with focus on above. Transferred to w/c with Stedy as decribed above wit min assist for all sit > stands after initial mod assist form lower surface. In front of mirror worked on identifying self in ship broker for L head turn and scanning of environment and use of sink for bimanual task to wash hands with extra time and max cues for incorporating L with hand over hand assist and cues to look for completeness of removal of soap. Left up in TIS with pillows positioned for LUE positioning. All needs in reach.   Therapy  Documentation Precautions:  Precautions Precautions: Fall, Other (comment) Precaution Comments: L hemiparesis, L visual field cut, L inattention, L hemisensory loss Restrictions Weight Bearing Restrictions Per Provider Order: No    Pain:  Denies pain. Just reports feeling left side is weird     Therapy/Group: Individual Therapy  Elnor Pizza Sherrell Pizza WENDI Elnor, PT, DPT, CBIS  02/21/2023, 8:42 AM

## 2023-02-21 NOTE — Progress Notes (Signed)
 Occupational Therapy Session Note  Patient Details  Name: Kathleen Figueroa MRN: [id number] Date of Birth: 02/25/1935  Today's Date: 02/21/2023 OT Individual Time: 1100-1204 OT Individual Time Calculation (min): 64 min    Short Term Goals: Week 1:  OT Short Term Goal 1 (Week 1): Pt will maintain sitting balance with CGA during BADLs OT Short Term Goal 2 (Week 1): Pt will locate 3/3 items in L visual field with min verbal cues OT Short Term Goal 3 (Week 1): Pt will recall hemi-dressing technique with min questioning cues OT Short Term Goal 4 (Week 1): Pt will complete functional transfers to toilet LRAD mod A  Skilled Therapeutic Interventions/Progress Updates:      Therapy Documentation Precautions:  Precautions Precautions: Fall, Other (comment) Precaution Comments: L hemiparesis, L visual field cut, L inattention, L hemisensory loss Restrictions Weight Bearing Restrictions Per Provider Order: No General: "I can do anything!" Pt seated in W/C upon OT arrival, agreeable to OT. Direct handoff from PT Carly, pt interested in shower during session.  Pain: no pain reported  ADL: Bed mobility: Max A, VC for leaning to left in order to manage UB into bed with OT assisting to manage LB into supine Grooming/oral hygiene: Mod A, pt requiring VC for midline orientation to find toothbrush and tactile cuing to bring Rt hand to midline to find toothbrush. Pt educated on attempting to use Lt hand as stabilizer. OT placed toothpaste in Lt hand with VC to squeeze with using Rt hand to screw toothpaste cap off, able to succeed, although required assistance to put cap back on, able to brush teeth with RUE and attend to left side of mouth without VC UB dressing: Max A, educated on hemi dressing technique with difficulty d/t Lt inattention, OT assisted with task, able to thread RUE through shirt, and assistance required overhead LB dressing: total A in stedy, assistance required for all parts of task,  seated for OT to thread over feet, standing in stedy to manage over waist Footwear: total A to don/doff socks  Shower transfer: total A stedy transfer, VC for increased posture when in stedy and maintaining awareness of Lt side  Bathing: Mod A overall, pt required assistance for LE and buttocks and VC for washing of Lt side, sitting on bariatric commode for increased lateral support, pt able to sit for entirety of shower without LOB, was relying on back rest for support  Transfers: total A +1 stedy transfers this date, use of VC for posture and maintaining grip of anterior bar with LUE    Other Treatments: Pt educated on maintaining awareness of Lt side, pt instructed to find various items on Rt side with Lt hand for increased functional/purposeful grasp/release and increasing awareness. Pt able to locate OT on Lt side on multiple occasions with increased time.    Pt supine in bed with bed alarm activated, 3 bed rails up, call light within reach and 4Ps assessed. All items on Rt side for easy access.    Therapy/Group: Individual Therapy  Camie Hoe, OTD, OTR/L 02/21/2023, 12:32 PM

## 2023-02-22 DIAGNOSIS — I63331 Cerebral infarction due to thrombosis of right posterior cerebral artery: Secondary | ICD-10-CM | POA: Diagnosis not present

## 2023-02-22 DIAGNOSIS — I69398 Other sequelae of cerebral infarction: Secondary | ICD-10-CM | POA: Diagnosis not present

## 2023-02-22 DIAGNOSIS — I10 Essential (primary) hypertension: Secondary | ICD-10-CM | POA: Diagnosis not present

## 2023-02-22 DIAGNOSIS — N3942 Incontinence without sensory awareness: Secondary | ICD-10-CM | POA: Diagnosis not present

## 2023-02-22 LAB — CBC
HCT: 38.5 % (ref 36.0–46.0)
Hemoglobin: 12.5 g/dL (ref 12.0–15.0)
MCH: 27.7 pg (ref 26.0–34.0)
MCHC: 32.5 g/dL (ref 30.0–36.0)
MCV: 85.2 fL (ref 80.0–100.0)
Platelets: 238 10*3/uL (ref 150–400)
RBC: 4.52 MIL/uL (ref 3.87–5.11)
RDW: 13.9 % (ref 11.5–15.5)
WBC: 6.6 10*3/uL (ref 4.0–10.5)
nRBC: 0 % (ref 0.0–0.2)

## 2023-02-22 LAB — BASIC METABOLIC PANEL
Anion gap: 8 (ref 5–15)
BUN: 22 mg/dL (ref 8–23)
CO2: 27 mmol/L (ref 22–32)
Calcium: 8.8 mg/dL — ABNORMAL LOW (ref 8.9–10.3)
Chloride: 102 mmol/L (ref 98–111)
Creatinine, Ser: 1.17 mg/dL — ABNORMAL HIGH (ref 0.44–1.00)
GFR, Estimated: 46 mL/min — ABNORMAL LOW (ref >60)
Glucose, Bld: 127 mg/dL — ABNORMAL HIGH (ref 70–99)
Potassium: 3.8 mmol/L (ref 3.5–5.1)
Sodium: 137 mmol/L (ref 135–145)

## 2023-02-22 NOTE — Progress Notes (Signed)
 PROGRESS NOTE   Subjective/Complaints:  Labs reviewed , discussed with PT, poor attention to left is main limitation  Pt with difficult to describe sensation around the left eye but not the eye itself , not painful   ROS- denies fever, chills, CP, SOB, abdominal pain, new motor or sensory changes (suspect poor awareness of deficits)  Objective:   No results found.  Recent Labs    02/22/23 0500  WBC 6.6  HGB 12.5  HCT 38.5  PLT 238   Recent Labs    02/22/23 0500  NA 137  K 3.8  CL 102  CO2 27  GLUCOSE 127*  BUN 22  CREATININE 1.17*  CALCIUM  8.8*    Intake/Output Summary (Last 24 hours) at 02/22/2023 1050 Last data filed at 02/22/2023 0809 Gross per 24 hour  Intake 340 ml  Output --  Net 340 ml        Physical Exam: Vital Signs Blood pressure 139/64, pulse 68, temperature 98.2 F (36.8 C), temperature source Oral, resp. rate 18, height 5' 6 (1.676 m), weight 61.9 kg, SpO2 99%.   General: No acute distress, sitting in wheelchair working with therapy Affect appears a little flat HEENT: Minimal conjunctival redness, no drainage noted, sclera non injected , no periorbital swelling  Heart: Regular rate and rhythm no rubs murmurs or extra sounds Lungs: Clear to auscultation, breathing unlabored, no rales or wheezes Abdomen: Positive bowel sounds, soft nontender to palpation, nondistended Extremities: No clubbing, cyanosis, or edema Skin: No evidence of breakdown, no evidence of rash Neurologic: Alert and oriented x 4  Moving all 4 extremities to gravity and resistance Left upper and lower extremity ataxia Left hemineglect Absent sensation to light touch left upper and lower extremities Musculoskeletal: Full range of motion in all 4 extremities. No joint swelling    Prior exam motor strength is 5/5 in right deltoid, bicep, tricep, grip, hip flexor, knee extensors, ankle dorsiflexor and plantar  flexor Left side difficult to examine due to severe sensory ataxia Wandering LUE movements  Sensory exam absent sensation to light touch and proprioception in LEFT upper and lower extremities  Cerebellar exam severe sensory ataxia LUE and LLE    Assessment/Plan: 1. Functional deficits which require 3+ hours per day of interdisciplinary therapy in a comprehensive inpatient rehab setting. Physiatrist is providing close team supervision and 24 hour management of active medical problems listed below. Physiatrist and rehab team continue to assess barriers to discharge/monitor patient progress toward functional and medical goals  Care Tool:  Bathing    Body parts bathed by patient: Face, Front perineal area, Left arm, Chest   Body parts bathed by helper: Buttocks, Right upper leg, Left upper leg, Left lower leg, Right lower leg, Front perineal area, Right arm, Left arm, Abdomen     Bathing assist Assist Level: Maximal Assistance - Patient 24 - 49%     Upper Body Dressing/Undressing Upper body dressing   What is the patient wearing?: Pull over shirt    Upper body assist Assist Level: Total Assistance - Patient < 25%    Lower Body Dressing/Undressing Lower body dressing      What is the patient wearing?: Pants, Underwear/pull  up     Lower body assist Assist for lower body dressing: Total Assistance - Patient < 25%     Toileting Toileting    Toileting assist Assist for toileting: Total Assistance - Patient < 25%     Transfers Chair/bed transfer  Transfers assist  Chair/bed transfer activity did not occur: Safety/medical concerns (unsafe to get up)  Chair/bed transfer assist level: Dependent - mechanical lift (stedy)     Locomotion Ambulation   Ambulation assist   Ambulation activity did not occur: Safety/medical concerns (requires skilled assistance)          Walk 10 feet activity   Assist  Walk 10 feet activity did not occur: Safety/medical concerns         Walk 50 feet activity   Assist Walk 50 feet with 2 turns activity did not occur: Safety/medical concerns         Walk 150 feet activity   Assist Walk 150 feet activity did not occur: Safety/medical concerns         Walk 10 feet on uneven surface  activity   Assist Walk 10 feet on uneven surfaces activity did not occur: Safety/medical concerns         Wheelchair     Assist Is the patient using a wheelchair?: Yes Type of Wheelchair: Manual (TIS w/c)    Wheelchair assist level: Dependent - Patient 0%      Wheelchair 50 feet with 2 turns activity    Assist        Assist Level: Dependent - Patient 0%   Wheelchair 150 feet activity     Assist      Assist Level: Dependent - Patient 0%   Blood pressure 139/64, pulse 68, temperature 98.2 F (36.8 C), temperature source Oral, resp. rate 18, height 5' 6 (1.676 m), weight 61.9 kg, SpO2 99%.  Medical Problem List and Plan: 1. 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  Antithrombotics: -DVT/anticoagulation:  Pharmaceutical: Heparin              -antiplatelet therapy: Aspirin  and Brilinta  for three 90 days followed by aspirin  alone  hx left CEA 3. Pain Management: Tylenol  as needed   4. Mood/Behavior/Sleep: LCSW to evaluate and provide emotional support             -antipsychotic agents: n/a              - Melatonin PRN   5. Neuropsych/cognition: This patient is not quite capable of making decisions on her own behalf.   6. Skin/Wound Care: Routine skin care checks   7.  Fluids/Electrolytes/Nutrition: Routine Is and Os and follow-up chemistries   8: Hypertension: monitor TID and prn (Diovan  320 mg daily at home) Vitals:   02/21/23 1959 02/22/23 0420  BP: (!) 169/85 139/64  Pulse: 94 68  Resp: 18 18  Temp: 97.9 F (36.6 C) 98.2 F (36.8 C)  SpO2: 100% 99%   1/11  BP doing better, was restarted on irbesartan  150 yesterday,hospital substitution  1/12 intermittently elevated but overall better than prior days.  Continue current regimen and monitor trend for now  running generally lower in am but otherwise remains elevated  9: Hyperlipidemia: continue statin   10: DM-2: A1c = 6.4% on October, 2024 (no home meds)  -Continue to monitor on BMP tomorrow CBG (last 3)  No results for input(s): GLUCAP in the last 72 hours.    11: Elevated serum creatinine/GFR ~55: ? Mild CKD; now at baseline ~1.0             -1/11 creatinine yesterday 1.20, encourage continued fluid intake.   Recheck tomorrow      Latest Ref Rng & Units 02/22/2023    5:00 AM 02/19/2023    9:41 AM 02/14/2023    6:31 AM  BMP  Glucose 70 - 99 mg/dL 872  836  864   BUN 8 - 23 mg/dL 22  13  13    Creatinine 0.44 - 1.00 mg/dL 8.82  8.79  8.98   Sodium 135 - 145 mmol/L 137  138  136   Potassium 3.5 - 5.1 mmol/L 3.8  3.8  3.5   Chloride 98 - 111 mmol/L 102  100  101   CO2 22 - 32 mmol/L 27  27  25    Calcium  8.9 - 10.3 mg/dL 8.8  8.9  8.3    Restart ARB , medium dose Avapro  150mg  daily , (home dose diovan  320mg  every day) may need to titrate up  Rapid response team gave amlodipine  5mg  last noc 1/10  12. Eye redness  -Artificial tears as needed ordered, continue to monitor  LOS: 4 days A FACE TO FACE EVALUATION WAS PERFORMED  Prentice FORBES Compton 02/22/2023, 10:50 AM

## 2023-02-22 NOTE — Progress Notes (Signed)
 Occupational Therapy Session Note  Patient Details  Name: Kathleen Figueroa MRN: [id number] Date of Birth: 1935-07-23  Today's Date: 02/22/2023 OT Individual Time: 0803-0900 OT Individual Time Calculation (min): 57 min  OT Individual Time: 8695-8584 OT Individual Time Calculation (min): 71 min   Short Term Goals: Week 1:  OT Short Term Goal 1 (Week 1): Pt will maintain sitting balance with CGA during BADLs OT Short Term Goal 2 (Week 1): Pt will locate 3/3 items in L visual field with min verbal cues OT Short Term Goal 3 (Week 1): Pt will recall hemi-dressing technique with min questioning cues OT Short Term Goal 4 (Week 1): Pt will complete functional transfers to toilet LRAD mod A  Skilled Therapeutic Interventions/Progress Updates:     AM Session:  Pt received semi-reclined in bed lightly sleeping waking upon OT arrival. Pt presenting to be in good spirits receptive to skilled OT session reporting 0/10 pain- OT offering intermittent rest breaks, repositioning, and therapeutic support to optimize participation in therapy session. Focus this session sit<>stands, functional tranfers, visual scanning, and BADL retraining.   Pt able to follow verbal cues to roll onto L side with min A required to locate L bed rail. Side lying to sitting EOB light mod A to bring B LEs to EOB and lift trunk. Sitting EOB, worked on visual scanning to L, trunk positioning to improve sitting balance, and L hemibody attention with Pt able to maintain sitting balance during BADLs with CGA to min A this session during UB dressing. Pt able to locate her L UE with mod verbal cues +increased time. Pt unable to recall hemi-dressing technique with re-education provided and Pt receptive to education. Pt doffed shirt with min A. Mod A required to weave L UE into shirt and maintain sitting balance with Pt able to weave R LE and bring shirt overhead. Pt completed sit <> stands without AD during LB dressing with heavy skilled min A  with OT stabilizing L LE to maintain position d/t significant proprioceptive deficits. Pants doff/donned total A with OT assisting with weaving B LEs and +2 rehab tech assisting with bringing pants to waist while OT provided max A for balance.   Pt completed squat pivot to R with mod A +2 min A to stabilize wc and guide hips. Transported Pt total A to therapy gym in [facility] for enrgy conservation and time management. Squat pivot to R TISWC>EOM mod A.   Positioned mirror in-front of Pt for increased visual feedback for improved body awareness with Pt able to turn head to L and locate self in mirror with increased time and mod-max verbal cues. Worked on sit<>stands using EVA walker from EOM with Pt able to complete 3 trials with skilled min A and OT stabilizing L LE. In standing position using B UE supported on RW, engaged Pt in visual scanning and cross midline reaching activity using R UE to retrieve squigz from L side of mirror with min/mod A provided or balance and stabilizing L U/LE d/t proprioceptive deficits with Pt able to complete 10 trials with mod/max verbal cues to locate squigz +increased time. Pt tolerated standing ~5 minutes x2 trials during activity with seated rest breaks provided between and following trials.   Squat pivot to L EOM>TISWC mod A with mod verbal cues provided for technique. Transported Pt back to room total A in [facility] for time management. Pt was left resting reclined in TISWC with call bell in reach, seatbelt alarm on, and all needs met.  PM Session:  Pt received sleeping in TISWC presenting to be in good spirits receptive to skilled OT session reporting 0/10 pain- OT offering intermittent rest breaks, repositioning, and therapeutic support to optimize participation in therapy session. Focus this session L hemibody NMRE, L attention/visual scanning, dynamic sitting balance, and BADL retraining.   Pt requesting to brush teeth at beginning of session. Positioned Pt at sink in  [facility] with toothpaste, spit basin, and toothbrush positioned on L side of sink. Pt required max verbal cues to turn head to L locate toiletry items, however was able to locate 3/3 items with significantly increased amount of time supervision. Education provided on hemi-techniques for brushing teeth with Pt receptive to education, however d/t visual and attention deficits, Pt requiring assistance with donning toothpaste. Pt then able to brush teeth with OT providing Pt with water cup and spit basin.   Transported Pt total A to therapy gym in [facility] for time management, however upon arriving to therapy gym pt requesting to use restroom so pt was transported back to room.   Utilized stedy for toilet transfer to facilitate increased opportunities to work on sit<>stand and facilitate WB'ing through L LE. Pt with significant proprioceptive deficits in L U/LE with assistance required to maintain L UE position on grab bar and L LE position on footplate. Education provided on Pt using self hand over hand assistance to maintain L UE position on grab bar with Pt initially completely unaware of her L UE stating I feel like I am crushing someone's hand, however able to learn technique by the end of toileting maintaining position with mod verbal cues. 3/3 toileting tasks completed total A with pt having continent void in toilet- nursing staff informed. Pt able to complete sit<>stands and maintain sitting balance in stedy with min A.   Pt completed squat pivot TISWC>EOM with mod A and mod verbal cues for technique. Engaged Pt in visual scanning dynamic sitting balance activity with vertical mirror positioned anterior to Pt with Pt instructed to turn head to L to locate squigz located on L side of mirror and then utilize R UE to reach across midline to retrieve. Pt able to locate 10/10 squigz with max verbal cues +increased time and maintain dynamic sitting balance min A. Squat pivot EOM>TISWC mod A to R. Transported Pt back  to room total A in wc.   Pt was left resting in TISWC with call bell in reach, seatbelt alarm on, and all needs met.    Therapy Documentation Precautions:  Precautions Precautions: Fall, Other (comment) Precaution Comments: L hemiparesis, L visual field cut, L inattention, L hemisensory loss Restrictions Weight Bearing Restrictions Per Provider Order: No   Therapy/Group: Individual Therapy  Katheryn SHAUNNA [place] 02/22/2023, 7:47 AM

## 2023-02-22 NOTE — Progress Notes (Signed)
 Physical Therapy Session Note  Patient Details  Name: Kathleen Figueroa MRN: [id number] Date of Birth: 04-Jun-1935  Today's Date: 02/22/2023 PT Individual Time: 1005-1100 PT Individual Time Calculation (min): 55 min   Short Term Goals: Week 1:  PT Short Term Goal 1 (Week 1): Pt will perform supine<>sit with min A PT Short Term Goal 2 (Week 1): Pt will perform sit<>stands using LRAD with min A PT Short Term Goal 3 (Week 1): Pt will perform bed<>chair transfers with mod A PT Short Term Goal 4 (Week 1): Pt will ambulate at least 51ft using LRAD with mod A of 1 and +2 assist as needed for safety  Skilled Therapeutic Interventions/Progress Updates:     Pt received seated in tilt in space WC and agrees to therapy. No complaint of pain. WC transport to gym for time management. Pt perform standing activities in parallel bars. Mirror provided for visual feedback and PT provides cueing throughout session to bring pt's attention and gaze to the Lt, as her tendency is for heavy Rt sided gaze preference. Pt performs multiple reps of sit to stand in parallel bars with modA and manual facilitation of LLE placement. Pt has little to no proprioceptive awareness of Lt hemibody, and LLE frequently kicks out in extension if not manually positioned. In standing, pt performs RLE foot raises to promote WB through LLE and Lt sided awareness. Pt completes 2x10 with seated rest break. Pt performs stand pivot to mat table with modA. Pt performs standing targeted 'high fives with +2 assistance and no AD, with targets placed in Lt visual field. Pt requires max cueing to attend to Lt side but is able to locate targets with cueing and increased time. Multiple seated rest breaks provided. Pt continues to perform sit to stand with modA +1, then performs Rt leg lifts with cues for neutral posture and engagement of LLE extensors motor groups. WC transport back to room. Left seated in tilt in space WC with alarm intact and all needs  within reach.   Therapy Documentation Precautions:  Precautions Precautions: Fall, Other (comment) Precaution Comments: L hemiparesis, L visual field cut, L inattention, L hemisensory loss Restrictions Weight Bearing Restrictions Per Provider Order: No  Therapy/Group: Individual Therapy  Elsie JAYSON Dawn, PT, DPT 02/22/2023, 10:37 AM

## 2023-02-23 DIAGNOSIS — N3942 Incontinence without sensory awareness: Secondary | ICD-10-CM | POA: Diagnosis not present

## 2023-02-23 DIAGNOSIS — I10 Essential (primary) hypertension: Secondary | ICD-10-CM | POA: Diagnosis not present

## 2023-02-23 DIAGNOSIS — I63331 Cerebral infarction due to thrombosis of right posterior cerebral artery: Secondary | ICD-10-CM | POA: Diagnosis not present

## 2023-02-23 DIAGNOSIS — I69398 Other sequelae of cerebral infarction: Secondary | ICD-10-CM | POA: Diagnosis not present

## 2023-02-23 NOTE — Progress Notes (Signed)
 Patient ID: Kathleen Figueroa, female   DOB: Apr 21, 1935, 88 y.o.   MRN: [id number]  Spoke with Sherrie-daughter, Ron-son and Veto regarding question of the care pt will need at discharge. Made aware she wil need 24/7 physical care-min level goals. Will know target discharge date after team conference tomorrow. Wanted to know options at discharge-ie SNF versus home with 24/7 care.

## 2023-02-23 NOTE — Progress Notes (Signed)
 Speech Language Pathology Daily Session Note  Patient Details  Name: Kathleen Figueroa MRN: [id number] Date of Birth: Jun 14, 1935  Today's Date: 02/23/2023 SLP Individual Time: 0900-0959 SLP Individual Time Calculation (min): 59 min  Short Term Goals: Week 1: SLP Short Term Goal 1 (Week 1): Patient will utilize swallowing compensatory strategies to reduce s/sx of aspiration during consumption of PO given mod multimodal A SLP Short Term Goal 2 (Week 1): Patient will demonstrate problem solving in basic daily situations given mod multimodal A SLP Short Term Goal 3 (Week 1): Patient will recall and utilize memory compensatory strategies given mod multimodal A SLP Short Term Goal 4 (Week 1): Patient will attend to L side during functional tasks given mod multimodal A  Skilled Therapeutic Interventions: Skilled therapy session focused on dysphagia and cognitive goals. SLP faciliated session by providing mod-maxA during left attention task. Patient was prompted to identify and categorize colored cards laid accross table span. She required mod-max verbal A to sustain attention and locate cards on L side. SLP then addressed dysphagia goals through offering PO trials of thin liquids via straw and D2 textures. Patient with adequate mastication times, functional oral clearance and no s/sx of aspiration accross consistencies. Recommend continuation of current diet (D2/thin) with standardized swallowing precautions including single bites/sips, slow rate, sitting upright at 90 degrees and total A from staff to feed. Patient left in [facility] with alarm set and call bell in reach. Continue POC.  Pain Denies  Therapy/Group: Individual Therapy  Larinda Herter M.A., CF-SLP 02/23/2023, 7:41 AM

## 2023-02-23 NOTE — Progress Notes (Signed)
 Physical Therapy Session Note  Patient Details  Name: Kathleen Figueroa MRN: [id number] Date of Birth: 1935/10/02  Today's Date: 02/23/2023 PT Individual Time: 8895-8795 and 1311-1400 PT Individual Time Calculation (min): 60 min and 49 min  Short Term Goals: Week 1:  PT Short Term Goal 1 (Week 1): Pt will perform supine<>sit with min A PT Short Term Goal 2 (Week 1): Pt will perform sit<>stands using LRAD with min A PT Short Term Goal 3 (Week 1): Pt will perform bed<>chair transfers with mod A PT Short Term Goal 4 (Week 1): Pt will ambulate at least 61ft using LRAD with mod A of 1 and +2 assist as needed for safety  Skilled Therapeutic Interventions/Progress Updates:    Session 1: Pt received sitting in TIS w/c asleep and upon awakening is agreeable to therapy session. Kathleen Figueroa, ATP present for improved wheelchair fitting to use during CIR and initiate process of preparing for pt's likely custom wheelchair needs upon D/C.  Transported to/from gym in w/c for time management and energy conservation.  Discussed the following regarding pt's impairments and needs for a wheelchair:  - absent L hemibody sensation - significant visual acuity and perceptual impairments  - high risk of skin breakdown due to sensory and awareness deficits - L inattention - impaired trunk control  - impaired truncal proprioceptive awareness  - need for tilt feature for pressure relieving  - need for contoured back support for trunk in midline  Squat pivot transfers w/c<>EOM multiple times during session with skilled heavy min A for lifting/pivoting hips - pt continues to require step-by-step cuing for sequencing, positioning of her extremities, and head/hips relationship. Cuing throughout for increased L attention and visual scanning.  Sitting EOM, pt requires close SBA/CGA for safety with intermittent min A for trunk control - pt demonstrating R anterior trunk lean and R trunk rotation with poor midline orientation and  impaired awareness of when having LOB or leaning outside BOS - 2x has posterior lean but is able to recover with verbal cuing to bring awareness to this.   Provided pt with K5 ultra-lightweight manual wheelchair with improved fit, does  not have contoured back, only tension adjustable. Initiated education to pt on w/c parts including brakes - placed yellow coban on brakes to help with visual and tactile identification of them.   Initiated B LE w/c propulsion training with pt having significant incoordination in L LE and therefore therapist provided support to L LE and then had pt perform only R LE forward propulsion - requires mod/max A to maintain forward momentum and direct the wheelchair for obstacle navigation - pt will require significant training to improve ability to propel wheelchair with increased independence. *Pt also started to have significant posterior pelvic tilt and sliding hips forward out of wheelchair.  Pt is only safe to sit in the TIS wheelchair when alone in her room. With plan to use the K5 wheelchair during therapy only at this time.  Therapist padded L LE wheelchair foot plates on both wheelchairs to avoid injury to LLE due to sensory deficits.   Discussed that Liberty wheelchair would be most ideal at this time - at very end of session found one available that will have to set-up in next session.  Pt identifies need to urinate but unable to make it to her room in time. Transported back in TIS w/c.   Therapist noticed pt with bruise on L shin - anticipate it is likely from use of steady  NT present to assist  with steady and therapist educates NT and nurse on pt's L hemisensory deficits and need to monitor L LE positioning during transfers to avoid injuries. Sit>stand TIS w/c>steady with min A and therapist providing step-by-step cuing for sequencing and safe set-up for transfer. Pt left seated in TIS w/c in the care of nursing staff.    Session 2: Pt received sitting  upright in TIS w/c with NT present assisting with finishing meal. Pt agreeable to therapy session upon completion.  Therapist assisted pt in calling her daughter, Kathleen Figueroa, to discuss pt's need for different sneakers due to her current ones being too short for her R foot causing pain at her great toe and toenail. Also, provided brief education on pt's likely need for a custom wheelchair and plan for a consult closer to D/C.   Retrieved the Liberty wheelchair to set-up for patient to use, instead of the ultra-lightweight K5 set-up in prior therapy session because pt currently requires the tilt feature to improve her safety due to impaired trunk control.   Squat pivot transfers w/c<>EOM with continued skilled heavy min A for lifting/pivoting hips and therapist providing step-by-step verbal cuing for sequencing proper positioning of her extremities, her trunk, and for head/hips relationship - pt relies on these cues to allow her to successfully complete the transfer with this level of assistance. Therapist also providing dependent assist for set-up of wheelchair and w/c part management during transfers.  Therapist adjusted Liberty wheelchair head rest, armrests, and padded L foot rest to protect pt's foot/ankle due to sensory deficits. Therapist also placed yellow Coban on Liberty w/c brakes to start working on visually scanning to find targets to allow pt involvement in w/c brake management.   Transported back to her room. Sit>supine in bed with mod A for B LE management onto bed with pt having impaired motor planning.   Therapist placed a pillow for additional padding on the steady shin block due to concern with pt's L LE sensory deficits. Pt left supine in bed with needs in reach and bed alarm on.   Therapy Documentation Precautions:  Precautions Precautions: Fall, Other (comment) Precaution Comments: L hemiparesis, L visual field cut, L inattention, L hemisensory loss Restrictions Weight Bearing  Restrictions Per Provider Order: No   Pain: Session 1: Reports she has a deep ache in her L LE, primarily in her thigh, provided cuing throughout to ensure safe placement of L LE during all mobility tasks.   Session 2:  No specific complaints of pain during session at this time.    Therapy/Group: Individual Therapy  Connell CHRISTELLA Kiss , PT, DPT, NCS, CSRS 02/23/2023, 11:16 AM

## 2023-02-23 NOTE — Progress Notes (Signed)
 Occupational Therapy Session Note  Patient Details  Name: Kathleen Figueroa MRN: [id number] Date of Birth: 04-Aug-1935  Today's Date: 02/23/2023 OT Individual Time: 9196-9149 OT Individual Time Calculation (min): 47 min    Short Term Goals: Week 1:  OT Short Term Goal 1 (Week 1): Pt will maintain sitting balance with CGA during BADLs OT Short Term Goal 2 (Week 1): Pt will locate 3/3 items in L visual field with min verbal cues OT Short Term Goal 3 (Week 1): Pt will recall hemi-dressing technique with min questioning cues OT Short Term Goal 4 (Week 1): Pt will complete functional transfers to toilet LRAD mod A  Skilled Therapeutic Interventions/Progress Updates:     Pt received sitting up in bed finishing breakfast with RN present in room. Pt presenting to be in good spirits receptive to skilled OT session reporting 0/10 pain at rest, however pain in L LE reported during stedy transfers later in session- OT offering intermittent rest breaks, repositioning, and therapeutic support to optimize participation in therapy session. Pt requesting to take shower this AM- focus this session L side attention, sitting balance, body awareness, and sit<>stands within the context of shower level BADLs. Pt transitioned to EOB using bed rails with HOB elevated light mod A this session with HOB elevated. Utilized stedy for functional tranfers from EOB > TTB position in walk-in shower and TTB > TISWC following shower. Pt able to complete sit<>stands this session in stedy with skilled min A and consistent max verbal cues to maintain L UE position on grab bar by providing self HOH A. Pt maintained sitting balance with SBA/CGA with mod-max verbal cues for trunk positioning and alignment during bathing tasks. Pt able to complete UB bathing seated with min A to wash R UE as Pt was able to locate and wash her L UE following single verbal cue! Pt utilized R UE to wash portions of her upper legs and anterior peri-areas with  assistance provided to wash buttocks, lower portion of B LEs, and feet. Following shower, Pt completed U/LB dressing tasks seated in wc. Pt able to recall hemi-dressing technique with min questioning cues and initiate weaving L UE into shirt with max A required d/t proprioceptive and awareness deficits. Weaved B LEs into pants and donned socks/shoes total A. Pt stood with mod A from wc while +2 therapy tech assisted with bringing pants to waist. Pt continues to present with proprioceptive, L side attention, and awareness deficits impacting her safety and independence in BADLs. Pt was left resting in TISWC with call bell in reach, seatbelt alarm on, and all needs met.    Therapy Documentation Precautions:  Precautions Precautions: Fall, Other (comment) Precaution Comments: L hemiparesis, L visual field cut, L inattention, L hemisensory loss Restrictions Weight Bearing Restrictions Per Provider Order: No   Therapy/Group: Individual Therapy  Kathleen Figueroa 02/23/2023, 7:49 AM

## 2023-02-23 NOTE — Progress Notes (Signed)
 PROGRESS NOTE   Subjective/Complaints:  Kathleen Figueroa is preaching this am , memorized multiple psalms  ROS- poor sensation left side , L neglect, - pain c/os, no breathing issues   Objective:   No results found.  Recent Labs    02/22/23 0500  WBC 6.6  HGB 12.5  HCT 38.5  PLT 238   Recent Labs    02/22/23 0500  NA 137  K 3.8  CL 102  CO2 27  GLUCOSE 127*  BUN 22  CREATININE 1.17*  CALCIUM  8.8*    Intake/Output Summary (Last 24 hours) at 02/23/2023 0842 Last data filed at 02/22/2023 1824 Gross per 24 hour  Intake 200 ml  Output --  Net 200 ml        Physical Exam: Vital Signs Blood pressure 106/88, pulse 74, temperature 98 F (36.7 C), resp. rate 16, height 5' 6 (1.676 m), weight 61.9 kg, SpO2 99%.   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 No joint swelling  motor strength is 5/5 in right deltoid, bicep, tricep, grip, hip flexor, knee extensors, ankle dorsiflexor and plantar flexor Left side difficult to examine due to severe sensory ataxia but at least 4/5 Wandering LUE movements  Sensory exam absent sensation to light touch and proprioception in LEFT upper and lower extremities Mood and affect are bright     Assessment/Plan: 1. Functional deficits which require 3+  hours per day of interdisciplinary therapy in a comprehensive inpatient rehab setting. Physiatrist is providing close team supervision and 24 hour management of active medical problems listed below. Physiatrist and rehab team continue to assess barriers to discharge/monitor patient progress toward functional and medical goals  Care Tool:  Bathing    Body parts bathed by patient: Face, Front perineal area, Left arm, Chest   Body parts bathed by helper: Buttocks, Right upper leg, Left upper leg, Left lower leg, Right lower leg, Front perineal area, Right arm, Left arm, Abdomen     Bathing assist Assist Level: Maximal Assistance - Patient 24 - 49%     Upper Body Dressing/Undressing Upper body dressing   What is the patient wearing?: Pull over shirt    Upper body assist Assist Level: Total Assistance - Patient < 25%    Lower Body Dressing/Undressing Lower body dressing      What is the patient wearing?: Pants, Underwear/pull up     Lower body assist Assist for lower body dressing: Total Assistance - Patient < 25%     Toileting Toileting    Toileting assist Assist for toileting: Total Assistance - Patient < 25%     Transfers Chair/bed transfer  Transfers assist  Chair/bed transfer activity did not occur: Safety/medical concerns (unsafe to get up)  Chair/bed transfer assist level: Dependent - mechanical lift (stedy)     Locomotion Ambulation   Ambulation assist   Ambulation activity did not occur: Safety/medical concerns (requires skilled assistance)          Walk 10 feet activity   Assist  Walk 10 feet activity did not occur: Safety/medical concerns        Walk 50 feet activity   Assist Walk 50 feet with 2 turns activity did not occur: Safety/medical concerns         Walk 150 feet activity   Assist Walk 150 feet activity did not occur: Safety/medical concerns         Walk 10 feet on uneven surface  activity   Assist Walk 10 feet on  uneven surfaces activity did not occur: Safety/medical concerns         Wheelchair     Assist Is the patient using a wheelchair?: Yes Type of Wheelchair: Manual (TIS w/c)    Wheelchair assist level: Dependent - Patient 0%      Wheelchair 50 feet with 2 turns activity    Assist        Assist Level: Dependent - Patient 0%   Wheelchair 150 feet activity     Assist      Assist Level: Dependent - Patient 0%   Blood pressure 106/88, pulse 74, temperature 98 F (36.7 C), resp. rate 16, height 5' 6 (1.676 m), weight 61.9 kg, SpO2 99%.  Medical Problem List and Plan: 1. Functional deficits secondary to ischemic infarct right PCA territory ( Right temporal infarct 11/12/22, old Left MCA infarct 2015)              -patient may shower             -ELOS/Goals: 18-21 days, Supervision to Min A OT/Kathleen Figueroa/SLP             Large PCA infarct with severe sensory deficits on left alien arm on left, will have limited progress unless sensory deficits improve   -CT head 02/19/2023-expected evolution of CVA  -team conf in am    2.  Antithrombotics: -DVT/anticoagulation:  Pharmaceutical: Heparin              -antiplatelet therapy: Aspirin  and Brilinta  for three 90 days followed by aspirin  alone  hx left CEA 3. Pain Management: Tylenol  as needed   4. Mood/Behavior/Sleep: LCSW to evaluate and provide emotional support             -antipsychotic agents: n/a              - Melatonin PRN   5. Neuropsych/cognition: This patient is not quite capable of making decisions on her own behalf.   6. Skin/Wound Care: Routine skin care checks   7. Fluids/Electrolytes/Nutrition: Routine Is and Os and follow-up chemistries   8: Hypertension: monitor TID and prn (Diovan  320 mg daily at home) Vitals:   02/22/23 1947 02/23/23 0359  BP: (!) 161/81 106/88  Pulse: 78 74  Resp: 16 16  Temp: 98 F (36.7 C) 98 F (36.7 C)  SpO2: 97% 99%  Pm elevation  9: Hyperlipidemia: continue statin   10:  DM-2: A1c = 6.4% on October, 2024 (no home meds)  -Continue to monitor on BMP tomorrow CBG (last 3)  No results for input(s): GLUCAP in the last 72 hours.    11: Elevated serum creatinine/GFR ~55: ?  Mild CKD; near baseline ~1.0             -       Latest Ref Rng & Units 02/22/2023    5:00 AM 02/19/2023    9:41 AM 02/14/2023    6:31 AM  BMP  Glucose 70 - 99 mg/dL 872  836  864   BUN 8 - 23 mg/dL 22  13  13    Creatinine 0.44 - 1.00 mg/dL 8.82  8.79  8.98   Sodium 135 - 145 mmol/L 137  138  136   Potassium 3.5 - 5.1 mmol/L 3.8  3.8  3.5   Chloride 98 - 111 mmol/L 102  100  101   CO2 22 - 32 mmol/L 27  27  25    Calcium  8.9 - 10.3 mg/dL 8.8  8.9  8.3    Restart ARB , medium dose Avapro  150mg  daily , (home dose diovan  320mg  every day) may need to titrate up  Rapid response team gave amlodipine  5mg  1/10  12. Eye redness  -Artificial tears as needed ordered, continue to monitor  LOS: 5 days A FACE TO FACE EVALUATION WAS PERFORMED  Prentice FORBES Compton 02/23/2023, 8:42 AM

## 2023-02-24 DIAGNOSIS — I10 Essential (primary) hypertension: Secondary | ICD-10-CM | POA: Diagnosis not present

## 2023-02-24 DIAGNOSIS — N3942 Incontinence without sensory awareness: Secondary | ICD-10-CM | POA: Diagnosis not present

## 2023-02-24 DIAGNOSIS — I63331 Cerebral infarction due to thrombosis of right posterior cerebral artery: Secondary | ICD-10-CM | POA: Diagnosis not present

## 2023-02-24 DIAGNOSIS — I69398 Other sequelae of cerebral infarction: Secondary | ICD-10-CM | POA: Diagnosis not present

## 2023-02-24 MED ORDER — IRBESARTAN 300 MG PO TABS
300.0000 mg | ORAL_TABLET | Freq: Every day | ORAL | Status: DC
  Administered 2023-02-25 – 2023-03-08 (×12): 300 mg via ORAL
  Filled 2023-02-24 (×12): qty 1

## 2023-02-24 MED ORDER — ENOXAPARIN SODIUM 40 MG/0.4ML IJ SOSY
40.0000 mg | PREFILLED_SYRINGE | INTRAMUSCULAR | Status: DC
  Administered 2023-02-24 – 2023-03-14 (×19): 40 mg via SUBCUTANEOUS
  Filled 2023-02-24 (×19): qty 0.4

## 2023-02-24 NOTE — Progress Notes (Signed)
 Pt blood pressure was recorded 190/83, pulse 84 not taking in a resting state. Manually rechecked and was 160/90 pulse 86. Rechecked in an hour and was 162/88 pulse 90. Pt is asymptomatic. Will notify provider with any significant changes.  Bertram Brocks   RN

## 2023-02-24 NOTE — Progress Notes (Signed)
 Occupational Therapy Session Note  Patient Details  Name: Kathleen Figueroa MRN: [id number] Date of Birth: Jan 05, 1936  Today's Date: 02/24/2023 OT Individual Time: 1105-1201 OT Individual Time Calculation (min): 56 min    Short Term Goals: Week 1:  OT Short Term Goal 1 (Week 1): Pt will maintain sitting balance with CGA during BADLs OT Short Term Goal 2 (Week 1): Pt will locate 3/3 items in L visual field with min verbal cues OT Short Term Goal 3 (Week 1): Pt will recall hemi-dressing technique with min questioning cues OT Short Term Goal 4 (Week 1): Pt will complete functional transfers to toilet LRAD mod A  Skilled Therapeutic Interventions/Progress Updates:     Pt received sitting up in [facility], dressed and ready for the day upon OT arrival politely declining need for BADLs this session. Pt presenting to be in good spirits receptive to skilled OT session reporting 0/10 pain- OT offering intermittent rest breaks, repositioning, and therapeutic support to optimize participation in therapy session. Pt inquiring about location of her room phone and cell phones at beginning of session with education provided on visually scanning to L to locate phones positioned on tray table with Pt eventually able to locate phone with mod verbal cues. Pt not wearing glasses upon OT arrival, donned Pt's glasses to help Pt be more independent in operating her phone with Pt reporting improvement in vision with glasses donned. Transported Pt total A to therapy gym in [facility] for energy conservation and time management. Pt performed squat pivot transfers EOM<>TISWC this session with light mod A and max verbal cues- Pt responds well to verbal cues during transfer, however she is having a difficult time recalling technique and would benefit from increased learning opportunities. Focused session on visual scanning to the L, L side attention, L hemi-body awareness, standing tolerance, and dynamic sitting balance to increase  independence and safety in BADLs and functional transfers. Pt completed dynamic standing activities in EVA walker with vertical mirror positioned anterior to mirror for increased visual feedback to support improved body alignment and body awareness. +2 assistance provided from rehab tech throughout session to support increased safety. Pt maintained balance with BUEs supported in EVA walker with skilled min A with OT stabilizing L L/UE positioning d/t Pt's proprioceptive and sensory deficits with consistent verbal cues provided for Pt to locate self in mirror and to correct postural alignment. Pt tolerated standing ~2 minutes x3 trials in EVA walker while completing visual scanning activities to locate and retrieve items on her L side with mod-max verbal cues required to utilize visual scanning technique. Pt then completed dynamic sitting balance activities with vertical mirror anterior to Pt. Pt instructed to reach across midline and anteriorly  to retrieve items positioned in her L visual field and transport items to her R side to facilitate anterior trunk flexion to increase trunk control/body awareness for functional transfers and decrease overall caregiver bourdon. Pt responded well to verbal cues and external feedback from mirror to correct midline orientation with CGA to intermittent min A required to maintain dynamic sitting balance. At end of session, re-educated Pt on soft call button use with Pt demonstrating teachback as evidence of learning. Pt was left resting in TISCW with soft call bell in reach positioned on her R side, seatbelt alarm on, and all needs met.    Therapy Documentation Precautions:  Precautions Precautions: Fall, Other (comment) Precaution Comments: L hemiparesis, L visual field cut, L inattention, L hemisensory loss Restrictions Weight Bearing Restrictions Per Provider Order:  No   Therapy/Group: Individual Therapy  Geoffery Kiel 02/24/2023, 8:00 AM

## 2023-02-24 NOTE — Progress Notes (Signed)
 Physical Therapy Session Note  Patient Details  Name: Kathleen Figueroa MRN: [id number] Date of Birth: 1935/06/03  Today's Date: 02/24/2023 PT Individual Time: 1027-2536 PT Individual Time Calculation (min): 75 min   Short Term Goals: Week 1:  PT Short Term Goal 1 (Week 1): Pt will perform supine<>sit with min A PT Short Term Goal 2 (Week 1): Pt will perform sit<>stands using LRAD with min A PT Short Term Goal 3 (Week 1): Pt will perform bed<>chair transfers with mod A PT Short Term Goal 4 (Week 1): Pt will ambulate at least 63ft using LRAD with mod A of 1 and +2 assist as needed for safety  Skilled Therapeutic Interventions/Progress Updates:    Pt received awake, supported up in bed with trunk lean all the way to R side. Pt's meal tray in front of her and NT having just stepped out not long prior to therapist's arrival. Pt agreeable to therapy session.   Vitals assessed throughout session as follows:  - supine in bed to start: BP 157/70 (MAP 95), HR 81bpm  - after transfer to w/c: BP 162/83 (MAP 107), HR 95bpm  - after gait, in sitting: BP 142/69 (MAP 91), HR 94bpm   Supine>sitting L EOB, HOB elevated, with min A and step-by-step cuing for orientating to L side and sequencing motor plan to complete task with L hemibody management.   Sitting EOB with CGA and intermittent min A for trunk control/balance due to pt intermittently having slow, gradual posterior LOB and poor proprioceptive awareness of positioning of trunk while donning LB clothing total A, shoes total A, and donning UB clothing max A. Cuing throughout for pt to visually orient to L hemibody positioning and increase pt participation in Acupuncturist.   Sit>stand  from EOB to +2 R HHA with skilled mod A for lifting and balance while facilitating L LE positioning and WBing due to proprioceptive impairments and incoordinated movements - pulled pants up over hips dependently.   R squat pivot EOB>[place] w/c with mod A for  lifting/pivoting hips and cuing for proper sequencing and head/hips relationship as well as max cuing for prpoer positioning of L hemibody.   Transported to/from gym in w/c for time management and energy conservation.  Sit>stand w/c>R UE support on hallway rail with min A - continued max cuing for set-up of transfer and sequencing coming to stand due to proprioceptive deficits and inability to identify the positioning of her body in space.  Gait training 50ft at R hallway rail transitioned to +2 R HHA for additional ~18ft with skilled mod assist for balance and L LE management. Pt demonstrating the following gait deviations with therapist providing the described cuing and facilitation for improvement:  - slightly improved L LE coordination, but continues to require assist to avoid excessive adduction/scissoring, but now starts to have excessive L LE hip external rotation - good L knee control during stance, but min guarding for safety - strong L anterior trunk lean immediately following R swing advancement  - pt often has step-to pattern leading with L LE, provided cuing to attempt reciprocal pattern but difficulty performing and sustaining due to poor trunk control and L anterior LOB - today pt starts to frequently have excessively wide BOS  - overall poor trunk control with R/L postural sway   Repeated gait training additional ~10ft with +2 R HHA and 3rd person w/c follow for safety - therapist continuing to provide skilled mod/max A for balance and L LE management as just described.  Pt requesting to remain up in w/c - transported back to room for transfer to large TIS w/c for safety. Squat pivots w/c>EOB>TIS w/c with continued skilled step-by-step cuing for positioning of her L hemibody, trunk anterior weight shift, and head/hips relationship with skilled min/mod A for lifting/pivoting hips.  Pt left seated tilted back in TIS w/c with needs in reach (call button in her R hand), seat belt, and  seat belt alarm on.    Therapy Documentation Precautions:  Precautions Precautions: Fall, Other (comment) Precaution Comments: L hemiparesis, L visual field cut, L inattention, L hemisensory loss Restrictions Weight Bearing Restrictions Per Provider Order: No   Pain:  Denies pain during session but will occasionally grimace during mobility like she is uncomfortable.    Therapy/Group: Individual Therapy  Rhett Cella , PT, DPT, NCS, CSRS 02/24/2023, 7:44 AM

## 2023-02-24 NOTE — Progress Notes (Signed)
 Speech Language Pathology Daily Session Note  Patient Details  Name: Kathleen Figueroa MRN: [id number] Date of Birth: [birth year]  Today's Date: 02/24/2023 SLP Individual Time: 1300-1345 SLP Individual Time Calculation (min): 45 min  Short Term Goals: Week 1: SLP Short Term Goal 1 (Week 1): Patient will utilize swallowing compensatory strategies to reduce s/sx of aspiration during consumption of PO given mod multimodal A SLP Short Term Goal 2 (Week 1): Patient will demonstrate problem solving in basic daily situations given mod multimodal A SLP Short Term Goal 3 (Week 1): Patient will recall and utilize memory compensatory strategies given mod multimodal A SLP Short Term Goal 4 (Week 1): Patient will attend to L side during functional tasks given mod multimodal A  Skilled Therapeutic Interventions: Skilled therapy session focused on dysphagia and cognitive goals. SLP faciliated session by providing supervisionA during completion of pharyngeal strengthening exercises. Patient completed x10 repetitions of chin tucks against resistance, [place] and effortful swallows. SLP targeted cognitive goals through completion of vacation budgeting task. Patient required minA to choose destination, budget and required costs (hotel, flight, and transportation). Patient 100% intelligibile independently during session. Patient left in bed with alarm set and call bell in reach. Continue POC.    Pain None reported   Therapy/Group: Individual Therapy  Shweta Aman M.A., CF-SLP 02/24/2023, 7:36 AM

## 2023-02-24 NOTE — Progress Notes (Signed)
 PROGRESS NOTE   Subjective/Complaints:  Elevated BP last noc , discussed increased BP meds   ROS- poor sensation left side , L neglect, - pain c/os, no breathing issues   Objective:   No results found.  Recent Labs    02/22/23 0500  WBC 6.6  HGB 12.5  HCT 38.5  PLT 238   Recent Labs    02/22/23 0500  NA 137  K 3.8  CL 102  CO2 27  GLUCOSE 127*  BUN 22  CREATININE 1.17*  CALCIUM  8.8*    Intake/Output Summary (Last 24 hours) at 02/24/2023 0923 Last data filed at 02/23/2023 1700 Gross per 24 hour  Intake 236 ml  Output --  Net 236 ml        Physical Exam: Vital Signs Blood pressure (!) 162/88, pulse 90, temperature 98.2 F (36.8 C), resp. rate 17, height 5\' 6"  (1.676 m), weight 61.9 kg, SpO2 98%.   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 No joint swelling  motor strength is 5/5 in right deltoid, bicep, tricep, grip, hip flexor, knee extensors, ankle dorsiflexor and plantar flexor Left side difficult to examine due to severe sensory ataxia but at least 4/5 Wandering LUE movements  Sensory exam absent sensation to light touch and proprioception in LEFT upper and lower extremities Mood and affect are bright     Assessment/Plan: 1. Functional deficits which  require 3+ hours per day of interdisciplinary therapy in a comprehensive inpatient rehab setting. Physiatrist is providing close team supervision and 24 hour management of active medical problems listed below. Physiatrist and rehab team continue to assess barriers to discharge/monitor patient progress toward functional and medical goals  Care Tool:  Bathing    Body parts bathed by patient: Face, Front perineal area, Left arm, Chest   Body parts bathed by helper: Buttocks, Right upper leg, Left upper leg, Left lower leg, Right lower leg, Front perineal area, Right arm, Left arm, Abdomen     Bathing assist Assist Level: Maximal Assistance - Patient 24 - 49%     Upper Body Dressing/Undressing Upper body dressing   What is the patient wearing?: Pull over shirt    Upper body assist Assist Level: Total Assistance - Patient < 25%    Lower Body Dressing/Undressing Lower body dressing      What is the patient wearing?: Pants, Underwear/pull up     Lower body assist Assist for lower body dressing: Total Assistance - Patient < 25%     Toileting Toileting    Toileting assist Assist for toileting: Total Assistance - Patient < 25%     Transfers Chair/bed  transfer  Transfers assist  Chair/bed transfer activity did not occur: Safety/medical concerns (unsafe to get up)  Chair/bed transfer assist level: Dependent - mechanical lift (stedy)     Locomotion Ambulation   Ambulation assist   Ambulation activity did not occur: Safety/medical concerns (requires skilled assistance)          Walk 10 feet activity   Assist  Walk 10 feet activity did not occur: Safety/medical concerns        Walk 50 feet activity   Assist Walk 50 feet with 2 turns activity did not occur: Safety/medical concerns         Walk 150 feet activity   Assist Walk 150 feet activity did not occur: Safety/medical concerns         Walk 10 feet on uneven surface  activity   Assist Walk  10 feet on uneven surfaces activity did not occur: Safety/medical concerns         Wheelchair     Assist Is the patient using a wheelchair?: Yes Type of Wheelchair: Manual (TIS w/c)    Wheelchair assist level: Dependent - Patient 0%      Wheelchair 50 feet with 2 turns activity    Assist        Assist Level: Dependent - Patient 0%   Wheelchair 150 feet activity     Assist      Assist Level: Dependent - Patient 0%   Blood pressure (!) 162/88, pulse 90, temperature 98.2 F (36.8 C), resp. rate 17, height 5\' 6"  (1.676 m), weight 61.9 kg, SpO2 98%.  Medical Problem List and Plan: 1. 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 Formulized individual treatment plan based on medical history, underlying problem and comorbidities.    2.  Antithrombotics: -DVT/anticoagulation:  Pharmaceutical: Heparin  change to lovenox  to reduce needle stick, no sig renal failure              -antiplatelet therapy: Aspirin  and Brilinta  for three 90 days followed by aspirin  alone  hx left CEA 3. Pain Management: Tylenol  as needed   4. Mood/Behavior/Sleep: LCSW to evaluate and provide emotional support             -antipsychotic agents: n/a              - Melatonin PRN   5. Neuropsych/cognition: This patient is not quite capable of making decisions on her own behalf.   6. Skin/Wound Care: Routine skin care checks   7. Fluids/Electrolytes/Nutrition: Routine Is and Os  and follow-up chemistries   8: Hypertension: monitor TID and prn (Diovan  320 mg daily at home) Vitals:   02/24/23 0353 02/24/23 0455  BP: (!) 160/90 (!) 162/88  Pulse: 86 90  Resp:    Temp:    SpO2:    On avapro  150mg  will increase to 300mg   9: Hyperlipidemia: continue statin  10: DM-2: A1c = 6.4% on October, 2024 (no home meds)  -Continue to monitor on BMP tomorrow CBG (last 3)  No results for input(s): "GLUCAP" in the last 72 hours.    11: Elevated serum creatinine/GFR ~55: ? Mild CKD; near baseline ~1.0             -       Latest Ref Rng & Units 02/22/2023    5:00 AM 02/19/2023    9:41 AM 02/14/2023    6:31 AM  BMP  Glucose 70 - 99 mg/dL 440  347  425   BUN 8 - 23 mg/dL 22  13  13    Creatinine 0.44 - 1.00 mg/dL 9.56  3.87  5.64   Sodium 135 - 145 mmol/L 137  138  136   Potassium 3.5 - 5.1 mmol/L 3.8  3.8  3.5   Chloride 98 - 111 mmol/L 102  100  101   CO2 22 - 32 mmol/L 27  27  25    Calcium  8.9 - 10.3 mg/dL 8.8  8.9  8.3    Monitor on increased ARB , recheck 1/20  12. Eye redness  -Artificial tears as needed ordered, continue to monitor  LOS: 6 days A FACE TO FACE EVALUATION WAS PERFORMED  Kathleen Figueroa 02/24/2023, 9:23 AM

## 2023-02-25 DIAGNOSIS — I63531 Cerebral infarction due to unspecified occlusion or stenosis of right posterior cerebral artery: Secondary | ICD-10-CM | POA: Diagnosis not present

## 2023-02-25 DIAGNOSIS — N182 Chronic kidney disease, stage 2 (mild): Secondary | ICD-10-CM | POA: Diagnosis not present

## 2023-02-25 DIAGNOSIS — I10 Essential (primary) hypertension: Secondary | ICD-10-CM | POA: Diagnosis not present

## 2023-02-25 DIAGNOSIS — E1169 Type 2 diabetes mellitus with other specified complication: Secondary | ICD-10-CM | POA: Diagnosis not present

## 2023-02-25 NOTE — Progress Notes (Signed)
Patient ID: Kathleen Figueroa, female   DOB: [birth year], 88 y.o.   MRN: [id number]  Spoke with Sherrie-daughter and [name] via telephone to give team conference update regarding goals of overall min assist and target discharge date of 2/4. Both are looking at options and son looking into her LTC policy regarding coverage. Aware pt will need 24/7 due tot his stroke is much worse than the other one. Daughter wants HCPOA and Living Will forms left in room and will ask for a chaplain referral from PA/MD. Work on best discharge option for pt and family

## 2023-02-25 NOTE — Progress Notes (Signed)
Physical Therapy Session Note  Patient Details  Name: Kathleen Figueroa MRN: [id number] Date of Birth: August 16, 1935  Today's Date: 02/25/2023 PT Individual Time:  -      Short Term Goals: Week 1:  PT Short Term Goal 1 (Week 1): Pt will perform supine<>sit with min A PT Short Term Goal 2 (Week 1): Pt will perform sit<>stands using LRAD with min A PT Short Term Goal 3 (Week 1): Pt will perform bed<>chair transfers with mod A PT Short Term Goal 4 (Week 1): Pt will ambulate at least 5ft using LRAD with mod A of 1 and +2 assist as needed for safety   Skilled Therapeutic Interventions/Progress Updates:  Patient seated upright in TIS w/c on entrance to room.  Patient alert and agreeable to PT session. Relates need to toilet. Urine collection hat noted in toilet and so pt setup for transfer to toilet using STEDY for time and ease.   Patient with no pain complaint at start of session.  Therapeutic Activity: Transfers: Pt performed sit<>stand transfer using STEDY. Provided with vc for foot placement as pt's attention is not on task. Then provided with questioning sues re: continued use of STEDY with pt able to correctly relate that she needed to grasp the hand bar. Direct vc provided for R hand positioning by telling pt to look at bar and then bring open R hand to place palm on bar. Mild/ mod coordination deficit to "zero in" on bar but then able to grasp. MinA provided to little finger for proper placement. Rises to stand with CGA. Sits on perch seat and holds upright positioning during transfer to toilet. Unfortunately, pt unable to hold urine and brief is wet. At toilet is able to sit with CGA but then requires MaxA for clothing mgmt, pericare, and brief change. MinA for rise to stand from toilet to STEDY. Returned to TIS w/c with CGA and vc for sequencing technique.  Discussed remaining in w/c or returning to bed and staff request to return to bed since therapy day is over. Pt guided in stand pivot and  is able to follow direct vc for rise to stand, stepping sequence, then descent to sit. Requires MinA overall but with max cues.  Bed Mobility: Pt performed sit > supine with MinA +2 with max cues and guidance in sequencing as she is unable to initiate with simple cue to "lie down".  Patient supine in bed at end of session with brakes locked, bed alarm set, and all needs within reach. Nt notified as to pt's disposition.    Therapy Documentation Precautions:  Precautions Precautions: Fall, Other (comment) Precaution Comments: L hemiparesis, L visual field cut, L inattention, L hemisensory loss Restrictions Weight Bearing Restrictions Per Provider Order: No  Pain:  No pain related this session.    Therapy/Group: Individual Therapy  Loel Dubonnet PT, DPT, CSRS 02/24/2023, 6:36 PM

## 2023-02-25 NOTE — Progress Notes (Signed)
Physical Therapy Session Note  Patient Details  Name: Asiyah Clearman MRN: [id number] Date of Birth: 1935-05-09  Today's Date: 02/25/2023 PT Individual Time: 1005-1048 PT Individual Time Calculation (min): 43 min   Short Term Goals: Week 2:  PT Short Term Goal 1 (Week 2): Pt will perform supine<>sit with min A PT Short Term Goal 2 (Week 2): Pt will perform sit<>stands using LRAD with min A consistently PT Short Term Goal 3 (Week 2): Pt will perform bed<>chair transfers using LRAD with min A PT Short Term Goal 4 (Week 2): Pt will ambulate at least 54ft using LRAD with mod A of 1 and +2 assist as needed  Skilled Therapeutic Interventions/Progress Updates: Patient supine in bed asleep (easily awakened after a few verbal attempts) on entrance to room. Patient alert and agreeable to PT session.   Patient reported no pain at beginning of session.  Therapeutic Activity: Bed Mobility: Pt performed supine<sit on EOB with minA (HOB elevated and R HHA). VC required for sequence ("bring L leg towards my voice"). Pt also required increased time/effort to bring awareness to L UE by moving eyes along with head in rotation. Transfers: Pt performed squat pivot transfer from EOB to TIS with heavy min/light modA. Provided VC for sequence and pt to recall head/hip relationship. Pt posteriorly scooted in TIS with minA, and anteriorly scooted to EOB with minA  Gait Training:  Pt ambulated roughly 29' using R hand rail outside of main gym with modA for L LE advancement/placement, and heavy modA for upright standing balance/support. Pt demonstrated the following gait deviations with therapist providing the described cuing and facilitation for improvement:  - Ataxic movement of B LE's (L>R) with VC to increase step length on R LE for step-through pattern, then mod cues to decrease step length to find appropriate step length - L LE with decrease in knee flexion during swing phase with "spring-like" movement that  required CGA of block at knee - VC for pt to move R UE along railing accordingly - Pt also required VC to increase upright positioning at hips vs slightly forward flexed posture, and to extend LE's during stance phase to promote increase in weight shifting for contralateral LE advancement.   Patient sitting in TIS at end of session with brakes locked, TIS belt donned, belt alarm set, and all needs within reach.    Therapy Documentation Precautions:  Precautions Precautions: Fall, Other (comment) Precaution Comments: L hemiparesis, L visual field cut, L inattention, L hemisensory loss Restrictions Weight Bearing Restrictions Per Provider Order: No  Therapy/Group: Individual Therapy  Shyler Hamill PTA 02/25/2023, 12:24 PM

## 2023-02-25 NOTE — Progress Notes (Signed)
Physical Therapy Weekly Progress Note  Patient Details  Name: Kathleen Figueroa MRN: [id number] Date of Birth: [birth year]  Beginning of progress report period: February 19, 2023 End of progress report period: February 24, 2022  Today's Date: 02/25/2023 PT Individual Time: 1121-1204 and 1324-4010 PT Individual Time Calculation (min): 43 min  and 47 min   Patient has met 1 of 4 short term goals. [name] is making steady progress with therapy; however, has greatest impairments of L hemisensory loss, L inattention, visual acuity, visual perceptual, and visual motor deficits, L hemiparesis, and impaired trunk control. She is performing supine<>sit using bed features with min/mod A, sit<>stands using R UE support on stable surface (steady/hallway rail/grab bar) with varying min/mod A but requires at least mod/max A without that support, and performs squat pivot transfers with min/mod A. Throughout all functional mobility tasks pt requires max step-by-step sequential cuing for motor planning, sequencing, and L hemibody management. Pt is participating in gait training up to ~19ft using +2 R HHA and mod/max A from therapist to manage balance and L LE gait mechanics. [name] will benefit continued CIR level skilled physical therapy to further progress her independence with functional mobility.  Patient continues to demonstrate the following deficits muscle weakness and muscle joint tightness, decreased cardiorespiratoy endurance, impaired timing and sequencing, unbalanced muscle activation, motor apraxia, decreased coordination, and decreased motor planning, decreased visual acuity, decreased visual perceptual skills, and decreased visual motor skills, decreased midline orientation and decreased attention to left, decreased attention, decreased awareness, decreased problem solving, decreased safety awareness, decreased memory, and delayed processing, and decreased sitting balance, decreased standing balance,  decreased postural control, hemiplegia, and decreased balance strategies and therefore will continue to benefit from skilled PT intervention to increase functional independence with mobility.  Patient progressing toward long term goals. Will have continue to assess LTGs with pt's progress.  Continue plan of care.  PT Short Term Goals Week 1:  PT Short Term Goal 1 (Week 1): Pt will perform supine<>sit with min A PT Short Term Goal 1 - Progress (Week 1): Progressing toward goal PT Short Term Goal 2 (Week 1): Pt will perform sit<>stands using LRAD with min A PT Short Term Goal 2 - Progress (Week 1): Progressing toward goal PT Short Term Goal 3 (Week 1): Pt will perform bed<>chair transfers with mod A PT Short Term Goal 3 - Progress (Week 1): Met PT Short Term Goal 4 (Week 1): Pt will ambulate at least 69ft using LRAD with mod A of 1 and +2 assist as needed for safety PT Short Term Goal 4 - Progress (Week 1): Progressing toward goal Week 2:  PT Short Term Goal 1 (Week 2): Pt will perform supine<>sit with min A PT Short Term Goal 2 (Week 2): Pt will perform sit<>stands using LRAD with min A consistently PT Short Term Goal 3 (Week 2): Pt will perform bed<>chair transfers using LRAD with min A PT Short Term Goal 4 (Week 2): Pt will ambulate at least 29ft using LRAD with mod A of 1 and +2 assist as needed  Skilled Therapeutic Interventions/Progress Updates:  Ambulation/gait training;Community reintegration;DME/adaptive equipment instruction;Neuromuscular re-education;Psychosocial support;Stair training;UE/LE Strength taining/ROM;Wheelchair propulsion/positioning;Balance/vestibular training;Discharge planning;Functional electrical stimulation;Pain management;Skin care/wound management;Therapeutic Activities;UE/LE Coordination activities;Disease management/prevention;Cognitive remediation/compensation;Functional mobility training;Patient/family education;Splinting/orthotics;Therapeutic  Exercise;Visual/perceptual remediation/compensation   Session 1: Pt received sitting up in TIS w/c and agreeable to therapy session. Pt able to verbalize prior education/cuing on importance of rotating head and eyes to the L to look at people rather than just  listening to their voice without visually finding them - therapist reinforces this throughout session. Pt reports she is having to accept things she cannot change about her situation and focus on the things she dose have the control over changing - reports praying the serenity prayer.   Transported to/from gym in w/c for time management and energy conservation.  Sit>stand w/c>+2 R HHA with skilled heavy mod A and +2 min A - pt does have improved motor planning and recall of prior education/training on sequencing this transfer including bringing L foot back underneath BOS and leaning forward - requires manual facilitation to ensure L LE reaches appropriate positioning and cuing to push up with R hand from w/c armrest.  Once in standing, requires mod A to maintain static standing balance due to sensory deficits in L LE and not knowing how she is moving it to maintain upright - cuing for trunk/hip/knee extension - postural sway in all directions but primarily posterior lean.  Gait training 35ft +55ft using +2 R HHA with skilled max assist for balance and L LE management with 3rd person w/c follow for safety. Pt demonstrating the following gait deviations with therapist providing the described cuing and facilitation for improvement:  - continues to have strong L anterior LOB requiring max A to maintain upright and benefits from cuing to weight shift towards +2 A on her R side - requires max A to control L LE positioning during swing today to avoid scissoring and risk of ankle rolling - continues to keep L LE extended throughout swing, cued to increase hip/knee flexion in L LE (but then had to educate pt on not doing that with her R LE because then she  has trouble disassociating her L LE movement from her R LE) - primarily step-to pattern leading with L LE   Deferred further gait training at this time with plan to transition on more dynamic standing balance and L visual scanning activities; however, pt reporting urge to void.  Transported back to room.   Sit>stand TIS w/c>stedy with light min A - pt requires cuing and assist to manage set-up and L hemibody positing for transfer. Stedy transfer to toilet, but pt with buttocks pain sitting on toilet so sit>stand from toilet>stedy with heavy min A for lifting and placed towel padding on toilet seat. Pt requesting additional time to attempt continent void - left in the care of 2 NTs.  Session 2: Pt received supine in bed awake reporting she was incontinent of bladder. Pt agreeable to therapy session.   Rolling R in bed with min A to help with L hemibody coordination and safety. Rolling L with min A for L hemibody safety as pt will place L LE off EOB without awareness of this and could result in her rolling too far off the bed if not assisted. Dependent LB clothing management and peri-care. Supine, hooklying in bed, attempted to have pt participate in pulling pants up over hips via bridging with pt able to pull up R side with hand-over-hand assist to grab pants, but due to L knee OA and lack of knee flexion ROM (as well as sensory and strength deficits) pt unable to successfully bridge on L side.  Donned shoes total A  Supine>sitting L EOB requiring max cuing to visually turn and look to L side and min A to bring trunk upright and assist with coordination of L LE.   R squat pivot EOB>TIS w/c with continued skilled heavy min A for lifting/pivoting hips  while providing max step-by-step cuing for sequencing L LE placement, anterior trunk lean, and head/hips relationship.    Transported to/from gym in w/c for time management and energy conservation.  L squat pivot transfers x2 during session with  skilled min A as just described - cuing for pt to visually scan and locate w/c prior to initiating the transfer.  Sitting EOM focused on L visual scanning and L UE coordination for NMR via scanning to locate colored bean bags >45degrees L of midline - requires max cuing to rotate head and eyes towards L side - progressed to having pt look to find her L arm (using yellow fall risk bracelet to help her identify it) and then to reaching to grasp bean bags with L hand. Pt with severe sensory incoordination deficits in L UE but with repetition is able to come within a few inches of grasping the bean bag, but once she has it in her hand she looses attention to her L arm to be able to functionally reach and place the bag on a table.   Transported back to room and pt requesting to remain up in TIS w/c for meal. Left with needs in reach, seat belt and seat belt alarm on, and purple soft call button in her R hand.   Therapy Documentation Precautions:  Precautions Precautions: Fall, Other (comment) Precaution Comments: L hemiparesis, L visual field cut, L inattention, L hemisensory loss Restrictions Weight Bearing Restrictions Per Provider Order: No   Pain:  Session 1: Pt reporting even more strong L thigh deep ache pain today, frequently moaning during mobility tasks throughout session due to the discomfort. Pt reports she is "OK" and requests to continue participating when asked, but does benefit from repositioning for pain management.  Session 2: Appears pt is starting to regain pain sensation in L LE. She has pain when attempting to perform terminal L knee flexion ROM (lacks full ROM due to knee OA) and also noted to say "ouch" when she accidentally bumps L LE on items during session. (Therapist providing padding to items and cuing to protect L hemibody throughout session)   Therapy/Group: Individual Therapy  [name] , PT, DPT, NCS, CSRS 02/25/2023, 7:54 AM

## 2023-02-25 NOTE — Patient Care Conference (Signed)
Inpatient RehabilitationTeam Conference and Plan of Care Update Date: 02/24/23   Time: 1044    Patient Name: Kathleen Figueroa      Medical Record Number: [id number]  Date of Birth: [birth year] Sex: Female         Room/Bed: 4W03C/4W03C-01 Payor Info: Payor: [employer] MEDICARE / Plan: [employer] MEDICARE HMO / Product Type: *No Product type* /    Admit Date/Time:  02/18/2023  4:29 PM  Primary Diagnosis:  <principal problem not specified>  Hospital Problems: Active Problems:   CVA (cerebral vascular accident) (HCC)   Stage 2 chronic kidney disease   Diabetes mellitus (HCC)   Primary hypertension    Expected Discharge Date: Expected Discharge Date: 03/16/23  Team Members Present: Physician leading conference: Dr. Claudette Laws Social Worker Present: Dossie Der, LCSW Nurse Present: Vedia Pereyra, RN;Tobie Hellen Shirlee Latch, RN;Deborah Lambert Mody, RN PT Present: Casimiro Needle, PT OT Present: Mariann Barter, OT SLP Present: Everardo Pacific, SLP PPS Coordinator present : Edson Snowball, PT     Current Status/Progress Goal Weekly Team Focus  Bowel/Bladder   Pt is curently continent with B/B with some incontinent episodes at night.  LBM 02/22/23   Pt will regain B/B continence   Assist pt with toileting needs q3-4 hours while awake    Swallow/Nutrition/ Hydration   D2/thin   supervision A  use of strategies, reduce s/sx aspiration    ADL's   Max A U/LB BADLs and toileting, skilled Min/mod squat pivot transfers to toilet; Barriers- significant L side attention deficits, L hemibody sensation and proprioceptive deficits, trunk control, skin break down prevention d/t sensation deficits   min A overall BADLs   L side attention, BADL retaining, L hemi-body attention, dynamic sitting balance, Pt education, sit<>stands, functional transfer training    Mobility   mod A supine<>sit using bed features, min/mod A squat pivot transfers with pt requiring total step-by-step verbal cuing for positioning and  sequencing of the transfer, min/mod A sit<>stands using R UE support on stable surface (hallway rail or steady bar), gait training at hallway rail with skilled skilled mod A for L LE management and balance with +2 w/c follow; initiated w/c propulsion requiring max A and total cuing for how to use R LE to propel --- greatest deficits are pt's absent L hemibody sensation, visual acuity and perceptual deficits, L inattention, and overall severely impaired awareness   CGA transfers, min A gait, and supervision w/c mobility -- may need to downgrade based on pt's progress  pt awarenss, L attention, transfer training, wheelchair management, L hemibody NMR, gait training, motor planning -- pt will likely require custom manual wheelchair -- family will need a few days of hands-on training prior to D/C    Communication                Safety/Cognition/ Behavioral Observations  modA   supervision   attention to L side, teach memory strategies, safety awareness and verbal problem solving    Pain   Denies pain on this shift   Pt will be free from pain   Assess pt for pain qshift/prn    Skin   Skin is intact   Will maintain skin intergrity with no breakdown  Assess skin qshift/prn and provide education to prevent skin breakdown      Discharge Planning:  Unsure if plan home versus SNF, family deciding best option for all of them. Does have LTC insurance and feels will have coverage for either.   Team Discussion: Patient was admitted post right PCA  CVA with severe left neglect and poor safety awareness. Medications adjusted due to increase HTN. Hand brace was discontinued due to sensory ataxia to avoid self injury.   Patient on target to meet rehab goals: no, currently goals were downgraded from CG transfers to minimal assistance.   Needs max assist with ADLS and needs; Mod assist +2 HHA for gait. Downgraded on diet from regular to D2 thin diet per SLP. *See Care Plan and progress notes for long  and short-term goals.   Revisions to Treatment Plan:  Gait training Wheelchair consult   Teaching Needs: Safety, Medications, transfers,toileting, dietary modifications, environmental modifications , etc.  Current Barriers to Discharge: Incontinence Decreased caregiver support  Possible Resolutions to Barriers: SNF recommended Family education Out patient follow -up     Medical Summary Current Status: severe left neglect, severe proprioceptive deficits LUE > LLE  Barriers to Discharge: Medical stability;Uncontrolled Hypertension   Possible Resolutions to [employer]: adjust BP meds , increased left sided awareness   Continued Need for Acute Rehabilitation Level of Care: The patient requires daily medical management by a physician with specialized training in physical medicine and rehabilitation for the following reasons: Direction of a multidisciplinary physical rehabilitation program to maximize functional independence : Yes Medical management of patient stability for increased activity during participation in an intensive rehabilitation regime.: Yes Analysis of laboratory values and/or radiology reports with any subsequent need for medication adjustment and/or medical intervention. : Yes   I attest that I was present, lead the team conference, and concur with the assessment and plan of the team.   Konrad [facility] 02/25/2023, 1:50 PM

## 2023-02-25 NOTE — Plan of Care (Signed)
  Problem: Consults Goal: RH STROKE PATIENT EDUCATION Description: See Patient Education module for education specifics  Outcome: Progressing   Problem: RH BOWEL ELIMINATION Goal: RH STG MANAGE BOWEL WITH ASSISTANCE Description: STG Manage Bowel with MOD I Assistance. Outcome: Progressing Flowsheets (Taken 02/25/2023 1820) STG: Pt will manage bowels with assistance: 5-Supervision/curing Goal: RH STG MANAGE BOWEL W/MEDICATION W/ASSISTANCE Description: STG Manage Bowel with Medication with MOD I Assistance. Outcome: Progressing Flowsheets (Taken 02/25/2023 1820) STG: Pt will manage bowels with medication with assistance: 5-Supervision/cueing   Problem: RH BLADDER ELIMINATION Goal: RH STG MANAGE BLADDER WITH ASSISTANCE Description: STG Manage Bladder With toileting Assistance Outcome: Progressing Flowsheets (Taken 02/25/2023 1820) STG: Pt will manage bladder with assistance: 5-Supervision/cueing

## 2023-02-25 NOTE — Progress Notes (Signed)
PROGRESS NOTE   Subjective/Complaints:  No acute events noted overnight. Patient reports that the disconnected feeling she was having has improved.  She says she feels much better.  No additional concerns.  ROS-denies chest pain or shortness of breath poor sensation left side , L neglect, - pain c/os, no breathing issues   Objective:   No results found.  No results for input(s): "WBC", "HGB", "HCT", "PLT" in the last 72 hours.  No results for input(s): "NA", "K", "CL", "CO2", "GLUCOSE", "BUN", "CREATININE", "CALCIUM" in the last 72 hours.   Intake/Output Summary (Last 24 hours) at 02/25/2023 1502 Last data filed at 02/25/2023 1348 Gross per 24 hour  Intake 718 ml  Output --  Net 718 ml        Physical Exam: Vital Signs Blood pressure 137/74, pulse 71, temperature 98.6 F (37 C), temperature source Oral, resp. rate 17, height 5\' 6"  (1.676 m), weight 61.9 kg, SpO2 99%.   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 No joint swelling  motor strength is 5/5 in right deltoid, bicep, tricep, grip, hip flexor, knee extensors, ankle dorsiflexor and plantar flexor Left side difficult to examine due to severe sensory ataxia but at least 4/5 Wandering LUE movements  Sensory exam absent sensation to light touch and  proprioception in LEFT upper and lower extremities     Assessment/Plan: 1. Functional deficits which require 3+ hours per day of interdisciplinary therapy in a comprehensive inpatient rehab setting. Physiatrist is providing close team supervision and 24 hour management of active medical problems listed below. Physiatrist and rehab team continue to assess barriers to discharge/monitor patient progress toward functional and medical goals  Care Tool:  Bathing    Body parts bathed by patient: Face, Front perineal area, Left arm, Chest   Body parts bathed by helper: Buttocks, Right upper leg, Left upper leg, Left lower leg, Right lower leg, Front perineal area, Right arm, Left arm, Abdomen     Bathing assist Assist Level: Maximal Assistance - Patient 24 - 49%     Upper Body Dressing/Undressing Upper body dressing   What is the patient wearing?: Pull over shirt    Upper body assist Assist Level: Total Assistance - Patient < 25%    Lower Body Dressing/Undressing Lower body dressing      What is the patient wearing?: Pants, Underwear/pull up     Lower body assist Assist for lower body dressing: Total Assistance - Patient < 25%     Toileting Toileting    Toileting assist Assist for toileting: Total Assistance - Patient < 25%     Transfers Chair/bed transfer  Transfers assist  Chair/bed  transfer activity did not occur: Safety/medical concerns (unsafe to get up)  Chair/bed transfer assist level: Moderate Assistance - Patient 50 - 74% (squat pivot)     Locomotion Ambulation   Ambulation assist   Ambulation activity did not occur: Safety/medical concerns (requires skilled assistance)  Assist level: 2 helpers Assistive device: Other (comment) Max distance: 81ft   Walk 10 feet activity   Assist  Walk 10 feet activity did not occur: Safety/medical concerns  Assist level: 2 helpers Assistive device: Other (comment)   Walk 50 feet activity   Assist Walk 50  feet with 2 turns activity did not occur: Safety/medical concerns         Walk 150 feet activity   Assist Walk 150 feet activity did not occur: Safety/medical concerns         Walk 10 feet on uneven surface  activity   Assist Walk 10 feet on uneven surfaces activity did not occur: Safety/medical concerns         Wheelchair     Assist Is the patient using a wheelchair?: Yes Type of Wheelchair: Manual    Wheelchair assist level: Total Assistance - Patient < 25% Max wheelchair distance: 19ft    Wheelchair 50 feet with 2 turns activity    Assist        Assist Level: Dependent - Patient 0%   Wheelchair 150 feet activity     Assist      Assist Level: Dependent - Patient 0%   Blood pressure 137/74, pulse 71, temperature 98.6 F (37 C), temperature source Oral, resp. rate 17, height 5\' 6"  (1.676 m), weight 61.9 kg, SpO2 99%.  Medical Problem List and Plan: 1. 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  Antithrombotics: -DVT/anticoagulation:  Pharmaceutical: Heparin change to lovenox to reduce needle stick, no sig renal failure              -antiplatelet therapy: Aspirin and Brilinta for three 90 days followed by aspirin alone  hx left CEA 3. Pain Management: Tylenol as needed   4. Mood/Behavior/Sleep: LCSW to evaluate and provide emotional support             -antipsychotic agents: n/a              - Melatonin PRN   5. Neuropsych/cognition: This patient is not quite capable of making decisions on her own behalf.   6. Skin/Wound Care:  Routine skin care checks   7. Fluids/Electrolytes/Nutrition: Routine Is and Os and follow-up chemistries   8: Hypertension: monitor TID and prn (Diovan 320 mg daily at home) Vitals:   02/25/23 0516 02/25/23 1256  BP: (!) 157/79 137/74  Pulse: 75 71  Resp: 16 17  Temp: 98 F (36.7 C) 98.6 F (37 C)  SpO2: 97% 99%  On avapro 150mg  will increase to 300mg   1/16 monitor response to medication change 9: Hyperlipidemia: continue statin   10: DM-2: A1c = 6.4%  on October, 2024 (no home meds)  -1/16 glucose stable 127 on BMP 1/13 CBG (last 3)  No results for input(s): "GLUCAP" in the last 72 hours.    11: Elevated serum creatinine/GFR ~55: ? Mild CKD; near baseline ~1.0             -       Latest Ref Rng & Units 02/22/2023    5:00 AM 02/19/2023    9:41 AM 02/14/2023    6:31 AM  BMP  Glucose 70 - 99 mg/dL 161  096  045   BUN 8 - 23 mg/dL 22  13  13    Creatinine 0.44 - 1.00 mg/dL 4.09  8.11  9.14   Sodium 135 - 145 mmol/L 137  138  136   Potassium 3.5 - 5.1 mmol/L 3.8  3.8  3.5   Chloride 98 - 111 mmol/L 102  100  101   CO2 22 - 32 mmol/L 27  27  25    Calcium 8.9 - 10.3 mg/dL 8.8  8.9  8.3    Monitor on increased ARB , recheck 1/20 Encourage fluid intake  12. Eye redness  -Artificial tears as needed ordered, continue to monitor  LOS: 7 days A FACE TO FACE EVALUATION WAS PERFORMED  Fanny Dance 02/25/2023, 3:02 PM

## 2023-02-25 NOTE — Progress Notes (Signed)
   02/25/23 1100  Spiritual Encounters  Type of Visit Attempt (pt unavailable)   Chaplain attempted to visit patient but she was not available. Chaplain will revisit.

## 2023-02-25 NOTE — Progress Notes (Signed)
   02/25/23 1400  Spiritual Encounters  Type of Visit Attempt (pt unavailable)   Attempted to visit patient but patient was eating. Chaplain will revisit.

## 2023-02-25 NOTE — Progress Notes (Signed)
Speech Language Pathology Daily Session Note  Patient Details  Name: Maribela Kennedy MRN: [id number] Date of Birth: [birth year]  Today's Date: 02/25/2023 SLP Individual Time: 1445-1530 SLP Individual Time Calculation (min): 45 min  Short Term Goals: Week 1: SLP Short Term Goal 1 (Week 1): Patient will utilize swallowing compensatory strategies to reduce s/sx of aspiration during consumption of PO given mod multimodal A SLP Short Term Goal 2 (Week 1): Patient will demonstrate problem solving in basic daily situations given mod multimodal A SLP Short Term Goal 3 (Week 1): Patient will recall and utilize memory compensatory strategies given mod multimodal A SLP Short Term Goal 4 (Week 1): Patient will attend to L side during functional tasks given mod multimodal A  Skilled Therapeutic Interventions: Skilled therapy session focused on cognitive goals. Upon entrance, patient with urinary incontinence in brief. SLP/NT completed peri-care and patient independently followed directions to turn to L and R. SLP then targeted cognitive goals through L attention task. SLP provided mod-maxA for patient to locate call bell on R arm rest. SLP placed colored paper on call bell in aid in vision. SLP continued to target cognition through education regarding memory strategies (WRAP: write, repeat, associate, picture). Patient named examples for each with minA and recalled 4/4 strategies at the end of the session. Patient left in bed with alarm set and call bell in hand. Continue POC.   Pain None reported   Therapy/Group: Individual Therapy  Nashea Chumney M.A., CF-SLP 02/25/2023, 7:45 AM

## 2023-02-25 NOTE — Progress Notes (Signed)
Occupational Therapy Session Note  Patient Details  Name: Kathleen Figueroa MRN: [id number] Date of Birth: 1935/10/21  Today's Date: 02/25/2023 OT Individual Time: 0800-0903 OT Individual Time Calculation (min): 63 min   Short Term Goals: Week 1:  OT Short Term Goal 1 (Week 1): Pt will maintain sitting balance with CGA during BADLs OT Short Term Goal 2 (Week 1): Pt will locate 3/3 items in L visual field with min verbal cues OT Short Term Goal 3 (Week 1): Pt will recall hemi-dressing technique with min questioning cues OT Short Term Goal 4 (Week 1): Pt will complete functional transfers to toilet LRAD mod A  Skilled Therapeutic Interventions/Progress Updates:  Pt greeted in care of NT eating breakfast, skilled OT session with focus on dynamic sitting balance and completion of meal.   Pain: Pt unable to rate pain in LUE, describing it as "throbbing without the throb." OT offering intermediate rest breaks and positioning suggestions throughout session to address pain/fatigue and maximize participation/safety in session.   Functional Transfers: Pt comes to EOB Min A + increased cuing for technique/sequencing, using bed rails.   At EOB, pt engaged in completion of breakfast meal. Min A fading to CGA then close supervision. Pt requires repetition of cue ". . . Turn your head to the left" for visual scanning of food items. As patient enjoyed meal, OT assisting with positioning/stabilization of LUE (especially shoulder) as patient consistently returned to protracted position and forearm flexion related to synergy pattern.   Pt assists with pants threading with cuing to bend B knees, bridging with A into position, all at bed-level. Dependent for donning shoes.     Pt remained resting in bed with 4Ps assessed and immediate needs met. Pt continues to be appropriate for skilled OT intervention to promote further functional independence in ADLs/IADLs.    Therapy Documentation Precautions:   Precautions Precautions: Fall, Other (comment) Precaution Comments: L hemiparesis, L visual field cut, L inattention, L hemisensory loss Restrictions Weight Bearing Restrictions Per Provider Order: No   Therapy/Group: Individual Therapy  Lou Cal, OTR/L, MSOT  02/25/2023, 6:22 AM

## 2023-02-26 DIAGNOSIS — K5900 Constipation, unspecified: Secondary | ICD-10-CM

## 2023-02-26 DIAGNOSIS — I10 Essential (primary) hypertension: Secondary | ICD-10-CM | POA: Diagnosis not present

## 2023-02-26 DIAGNOSIS — M792 Neuralgia and neuritis, unspecified: Secondary | ICD-10-CM

## 2023-02-26 DIAGNOSIS — N182 Chronic kidney disease, stage 2 (mild): Secondary | ICD-10-CM | POA: Diagnosis not present

## 2023-02-26 DIAGNOSIS — I63531 Cerebral infarction due to unspecified occlusion or stenosis of right posterior cerebral artery: Secondary | ICD-10-CM | POA: Diagnosis not present

## 2023-02-26 MED ORDER — SORBITOL 70 % SOLN
30.0000 mL | Freq: Every day | Status: DC | PRN
  Administered 2023-03-06 – 2023-03-10 (×2): 30 mL via ORAL
  Filled 2023-02-26 (×3): qty 30

## 2023-02-26 MED ORDER — POLYETHYLENE GLYCOL 3350 17 G PO PACK
17.0000 g | PACK | Freq: Every day | ORAL | Status: DC
  Administered 2023-02-26 – 2023-03-01 (×4): 17 g via ORAL
  Filled 2023-02-26 (×4): qty 1

## 2023-02-26 MED ORDER — BISACODYL 5 MG PO TBEC
5.0000 mg | DELAYED_RELEASE_TABLET | Freq: Every day | ORAL | Status: DC | PRN
  Administered 2023-03-06: 5 mg via ORAL
  Filled 2023-02-26: qty 1

## 2023-02-26 NOTE — Progress Notes (Signed)
Patient ID: Kaylena Petsche, female   DOB: 11/03/1935, 88 y.o.   MRN: [id number] Met with the patient to review current medical situation, rehab process, team conference and plan of care. Patient noted limitations with vision, used to following prayer circles and Dena Billet and now cannot see apps on her phone. Has trouble managing the land line phone.  Reviewed secondary risk management including DM (A1C 6.4), HTN, HLD (LDL 193/Trig 86/Total 278). Reviewed HH diet with the patient; reports she ate a lot of cheese and sour cream on everything PTA.  Reviewed toileting protocol; calling for assistance. Continue to follow along to address educational needs to facilitate preparation for discharge. Pamelia Hoit

## 2023-02-26 NOTE — Progress Notes (Addendum)
PROGRESS NOTE   Subjective/Complaints:  Patient indicates nerve pain in her left upper arm.  She reports constipation.  ROS-denies chest pain or shortness of breath.  Denies abdominal pain poor sensation left side , L neglect, - pain c/os, no breathing issues   Objective:   No results found.  No results for input(s): "WBC", "HGB", "HCT", "PLT" in the last 72 hours.  No results for input(s): "NA", "K", "CL", "CO2", "GLUCOSE", "BUN", "CREATININE", "CALCIUM" in the last 72 hours.   Intake/Output Summary (Last 24 hours) at 02/26/2023 1234 Last data filed at 02/26/2023 0800 Gross per 24 hour  Intake 960 ml  Output --  Net 960 ml        Physical Exam: Vital Signs Blood pressure (!) 149/80, pulse 84, temperature 98.2 F (36.8 C), resp. rate 17, height 5\' 6"  (1.676 m), weight 63.2 kg, SpO2 98%.   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 No joint swelling No significant left arm tenderness or pain with PROM noted  motor strength is 5/5 in right deltoid, bicep, tricep, grip, hip flexor, knee extensors, ankle dorsiflexor and plantar flexor Left side difficult to examine due to severe sensory ataxia but at least 4/5 Wandering LUE movements  Sensory exam absent sensation to light touch and proprioception in LEFT  upper and lower extremities     Assessment/Plan: 1. Functional deficits which require 3+ hours per day of interdisciplinary therapy in a comprehensive inpatient rehab setting. Physiatrist is providing close team supervision and 24 hour management of active medical problems listed below. Physiatrist and rehab team continue to assess barriers to discharge/monitor patient progress toward functional and medical goals  Care Tool:  Bathing    Body parts bathed by patient: Face, Front perineal area, Left arm, Chest   Body parts bathed by helper: Buttocks, Right upper leg, Left upper leg, Left lower leg, Right lower leg, Front perineal area, Right arm, Left arm, Abdomen     Bathing assist Assist Level: Maximal Assistance - Patient 24 - 49%     Upper Body Dressing/Undressing Upper body dressing   What is the patient wearing?: Pull over shirt    Upper body assist Assist Level: Total Assistance - Patient < 25%    Lower Body Dressing/Undressing Lower body dressing      What is the patient wearing?: Pants, Underwear/pull up     Lower body assist Assist for lower body dressing: Total Assistance - Patient < 25%     Toileting Toileting    Toileting assist Assist for toileting: Total Assistance - Patient < 25%     Transfers Chair/bed transfer  Transfers assist  Chair/bed transfer activity  did not occur: Safety/medical concerns (unsafe to get up)  Chair/bed transfer assist level: Moderate Assistance - Patient 50 - 74% (squat pivot)     Locomotion Ambulation   Ambulation assist   Ambulation activity did not occur: Safety/medical concerns (requires skilled assistance)  Assist level: 2 helpers Assistive device: Other (comment) Max distance: 71ft   Walk 10 feet activity   Assist  Walk 10 feet activity did not occur: Safety/medical concerns  Assist level: 2 helpers Assistive device: Other (comment)   Walk 50 feet activity   Assist Walk 50 feet with 2 turns  activity did not occur: Safety/medical concerns         Walk 150 feet activity   Assist Walk 150 feet activity did not occur: Safety/medical concerns         Walk 10 feet on uneven surface  activity   Assist Walk 10 feet on uneven surfaces activity did not occur: Safety/medical concerns         Wheelchair     Assist Is the patient using a wheelchair?: Yes Type of Wheelchair: Manual    Wheelchair assist level: Total Assistance - Patient < 25% Max wheelchair distance: 35ft    Wheelchair 50 feet with 2 turns activity    Assist        Assist Level: Dependent - Patient 0%   Wheelchair 150 feet activity     Assist      Assist Level: Dependent - Patient 0%   Blood pressure (!) 149/80, pulse 84, temperature 98.2 F (36.8 C), resp. rate 17, height 5\' 6"  (1.676 m), weight 63.2 kg, SpO2 98%.  Medical Problem List and Plan: 1. 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  Antithrombotics: -DVT/anticoagulation:  Pharmaceutical: Heparin change to lovenox to reduce needle stick, no sig renal failure              -antiplatelet therapy: Aspirin and Brilinta for three 90 days followed by aspirin alone  hx left CEA 3. Pain Management: Tylenol as needed   -1/17 gabapentin 100 mg 3 times daily for neuropathic pain 4. Mood/Behavior/Sleep: LCSW to evaluate and provide emotional support             -antipsychotic agents: n/a              - Melatonin PRN   5. Neuropsych/cognition: This patient is not quite capable of making decisions on her own behalf.    6. Skin/Wound Care: Routine skin care checks   7. Fluids/Electrolytes/Nutrition: Routine Is and Os and follow-up chemistries   8: Hypertension: monitor TID and prn (Diovan 320 mg daily at home) Vitals:   02/25/23 1933 02/26/23 0457  BP: 136/63 (!) 149/80  Pulse: 87 84  Resp: 17 17  Temp: 98 F (36.7 C) 98.2 F (36.8 C)  SpO2: 99% 98%  On avapro 150mg  will increase to 300mg   1/17 fair control continue current regimen and monitor 9: Hyperlipidemia: continue  statin   10: DM-2: A1c = 6.4% on October, 2024 (no home meds)  -1/16 glucose stable 127 on BMP 1/13 CBG (last 3)  No results for input(s): "GLUCAP" in the last 72 hours.    11: Elevated serum creatinine/GFR ~55: ? Mild CKD; near baseline ~1.0             -       Latest Ref Rng & Units 02/22/2023    5:00 AM 02/19/2023    9:41 AM 02/14/2023    6:31 AM  BMP  Glucose 70 - 99 mg/dL 846  962  952   BUN 8 - 23 mg/dL 22  13  13    Creatinine 0.44 - 1.00 mg/dL 8.41  3.24  4.01   Sodium 135 - 145 mmol/L 137  138  136   Potassium 3.5 - 5.1 mmol/L 3.8  3.8  3.5   Chloride 98 - 111 mmol/L 102  100  101   CO2 22 - 32 mmol/L 27  27  25    Calcium 8.9 - 10.3 mg/dL 8.8  8.9  8.3    Monitor on increased ARB , recheck 1/20 Continue to encourage and monitor fluid intake  12. Eye redness  -Artificial tears as needed ordered, continue to monitor  13. Constipation  -1/17 appears she had a few bowel movements yesterday however she still reported feeling constipated.  It does appear that she had not had a bowel movement for a few days prior to this so may still still have some stool in her colon.  Will schedule MiraLAX, add sorbitol as needed  LOS: 8 days A FACE TO FACE EVALUATION WAS PERFORMED  Fanny Dance 02/26/2023, 12:34 PM

## 2023-02-26 NOTE — Progress Notes (Signed)
Occupational Therapy Session Note  Patient Details  Name: Kathleen Figueroa MRN: [id number] Date of Birth: 1935/08/20  Today's Date: 02/26/2023 OT Individual Time: 1300-1400 OT Individual Time Calculation (min): 60 min    Short Term Goals: Week 1:  OT Short Term Goal 1 (Week 1): Pt will maintain sitting balance with CGA during BADLs OT Short Term Goal 2 (Week 1): Pt will locate 3/3 items in L visual field with min verbal cues OT Short Term Goal 3 (Week 1): Pt will recall hemi-dressing technique with min questioning cues OT Short Term Goal 4 (Week 1): Pt will complete functional transfers to toilet LRAD mod A Week 2:     Skilled Therapeutic Interventions/Progress Updates:    1: 1Pt received in the w/c. Self care retraining at shower level. Pt reported toileting needs. Transferred from w/c ot toilet with STEDY with min A for standing with max A for left UE/LE position with max multimodal cuing. Pt then transferred into the shower with the STEDY (sitting on the wide BSC). Pt able to bathe UB with min A with provided right/left UE directions. When given a command to do with left hand pt able to perform task with left hand- wash right UE, assist with washing hair with min physical guidance. Pt does require max multimodal cues for visually attend to task at midline and slightly to the left. Assistance provided for bathing buttocks and bilateral Lower legs.  Pt performed transfer out of the shower to the left with min  A with +2 stand step transfer to the right. Again pt successful with directional cues for what action a limb needed to perform- especially on the left due to decr preperception and sensation. Pt able to thread brief and pants on right LE. With backwards chaining able to thread shirt on left UE- then don over her head and then her right hand. Pt able to perform sit to stand with min A with proper positioning of left foot. Pt able to assist with pulling up pants on the right.  In the dayroom  focus on automatic movement. Placed in the Lite gait- positioned in it backwards -controls at her backside. Focus on dynamic balance and correcting LOB, automatic stepping with alternating steps with max cues. Pt ambulated 150 feet.   Pt left sitting up in w/c with safety measure in place.   Therapy Documentation Precautions:  Precautions Precautions: Fall, Other (comment) Precaution Comments: L hemiparesis, L visual field cut, L inattention, L hemisensory loss Restrictions Weight Bearing Restrictions Per Provider Order: No  Pain:  No indication of pain   Therapy/Group: Individual Therapy  Roney Mans [facility] 02/26/2023, 3:11 PM

## 2023-02-26 NOTE — Progress Notes (Signed)
Physical Therapy Session Note  Patient Details  Name: Kathleen Figueroa MRN: [id number] Date of Birth: 03-15-1935  Today's Date: 02/26/2023 PT Individual Time: 1502-1530 PT Individual Time Calculation (min): 28 min   Short Term Goals: Week 1:  PT Short Term Goal 1 (Week 1): Pt will perform supine<>sit with min A PT Short Term Goal 1 - Progress (Week 1): Progressing toward goal PT Short Term Goal 2 (Week 1): Pt will perform sit<>stands using LRAD with min A PT Short Term Goal 2 - Progress (Week 1): Progressing toward goal PT Short Term Goal 3 (Week 1): Pt will perform bed<>chair transfers with mod A PT Short Term Goal 3 - Progress (Week 1): Met PT Short Term Goal 4 (Week 1): Pt will ambulate at least 8ft using LRAD with mod A of 1 and +2 assist as needed for safety PT Short Term Goal 4 - Progress (Week 1): Progressing toward goal Week 2:  PT Short Term Goal 1 (Week 2): Pt will perform supine<>sit with min A PT Short Term Goal 2 (Week 2): Pt will perform sit<>stands using LRAD with min A consistently PT Short Term Goal 3 (Week 2): Pt will perform bed<>chair transfers using LRAD with min A PT Short Term Goal 4 (Week 2): Pt will ambulate at least 25ft using LRAD with mod A of 1 and +2 assist as needed  Skilled Therapeutic Interventions/Progress Updates: Pt presents reclined in TIS and agreeable to therapy.  Pt w/ significant L neglect, w/ shoulders rotated to right.  Pt corrects w/ verbal and visual cues.  Pt wheeled to hallway outside main gym.  Pt transferred sit to stand w/ mod A, cues for hand placement and maintaining position of L LE.  Pt amb x 20' w/ max A and step-by-step instructions for sequencing, max blocking of L knee and placement of L foot.  Pt performed sit to stand to perform partial squats  3 x 5 w/ blocking of knee anterior and posterior.  Pt returned to room and remained sitting in TIS, recliner back w/ chair alarm on and all needs in reach.  Pt educated on pancake call bell and  placed w/in sight.     Therapy Documentation Precautions:  Precautions Precautions: Fall, Other (comment) Precaution Comments: L hemiparesis, L visual field cut, L inattention, L hemisensory loss Restrictions Weight Bearing Restrictions Per Provider Order: No General:   Vital Signs: Therapy Vitals Temp: 97.6 F (36.4 C) Temp Source: Oral Pulse Rate: 82 Resp: 17 BP: 132/61 Patient Position (if appropriate): Sitting Oxygen Therapy SpO2: 100 % O2 Device: Room Air Pain:0/10     Therapy/Group: Individual Therapy  Lucio Edward 02/26/2023, 3:37 PM

## 2023-02-26 NOTE — Progress Notes (Signed)
Chaplain provided education to Pt about filling out the Advance Care Directives form. Pt asked to have her daughter to help her with some information. Chaplain asked Pt to notify this office when she completes the information. A notary and two witnesses will be provided then to finalize the form.  Oneida Alar Chaplain Resident    02/26/23 1242  Spiritual Encounters  Type of Visit Initial  Care provided to: Patient  Referral source Clinical staff  Reason for visit Advance directives  OnCall Visit No

## 2023-02-26 NOTE — Progress Notes (Signed)
Speech Language Pathology Weekly Progress and Session Note  Patient Details  Name: Kathleen Figueroa MRN: [id number] Date of Birth: [birth year]  Beginning of progress report period: February 19, 2023 End of progress report period: February 26, 2023  Today's Date: 02/26/2023 SLP Individual Time: 0800-0850 SLP Individual Time Calculation (min): 50 min  Short Term Goals: Week 1: SLP Short Term Goal 1 (Week 1): Patient will utilize swallowing compensatory strategies to reduce s/sx of aspiration during consumption of PO given mod multimodal A SLP Short Term Goal 1 - Progress (Week 1): Met SLP Short Term Goal 2 (Week 1): Patient will demonstrate problem solving in basic daily situations given mod multimodal A SLP Short Term Goal 2 - Progress (Week 1): Met SLP Short Term Goal 3 (Week 1): Patient will recall and utilize memory compensatory strategies given mod multimodal A SLP Short Term Goal 3 - Progress (Week 1): Met SLP Short Term Goal 4 (Week 1): Patient will attend to L side during functional tasks given mod multimodal A SLP Short Term Goal 4 - Progress (Week 1): Not met    New Short Term Goals: Week 2: SLP Short Term Goal 1 (Week 2): Patient will utilize swallowing compensatory strategies to reduce s/sx of aspiration during consumption of PO given min multimodal A SLP Short Term Goal 2 (Week 2): Patient will demonstrate problem solving in basic daily situations given min multimodal A SLP Short Term Goal 3 (Week 2): Patient will recall and utilize memory compensatory strategies given min multimodal A SLP Short Term Goal 4 (Week 2): Patient will attend to L side during functional tasks given mod multimodal A  Weekly Progress Updates: Pt has made good gains and has met 3 of 4 STGs this reporting period due to improved dysphagia and cognitive skills. Currently, patient continues to require mod A for use of swallowing compensatory strategies, memory and basic (verbal) problem solving. Patient  continues to require mod-maxA to attend to L visual field.  Pt/family eduction ongoing. Pt would benefit from continued ST intervention to maximize dysphagia and cognition in order to maximize functional independence at d/c.    Intensity: Minumum of 1-2 x/day, 30 to 90 minutes Frequency: 3 to 5 out of 7 days Duration/Length of Stay: 2/4 Treatment/Interventions: Cognitive remediation/compensation;Dysphagia/aspiration precaution training;Internal/external aids;Cueing hierarchy;Therapeutic Activities;Functional tasks;Patient/family education;Therapeutic Exercise   Daily Session  Skilled Therapeutic Interventions:  Skilled therapy session focused on dysphagia and cognitive goals. Patient asleep upon SLP arrival however easily roused. SLP offered breakfast tray consisting of D2/thin liquid textures. Patient required modA to take small bites/sips and completely clear oral cavity. Patient benefited from SLP feeding her due to visual deficits. Patient with mildly prolonged mastication and oral residuals, cleared through liquid wash. No s/sx of aspiration during consecutive sips of thin liquid, though occasional throat clears during solids. This is a new finding and SLP will continue to monitor during upcoming sessions. SLP targeted L attention throughout completion of breakfast by providing mod-maxA for patient to turn head and make eye contact with therapist. At the end of the session, patient reporting need for toileting. SLP placed bedpan and notified NT. Remaining 10 minutes of session missed for toileting. Patient left in bed with alarm set and call bell in reach. Continue POC.    Pain Pain in arm. MD aware.   Therapy/Group: Individual Therapy  Blayne Frankie M.A., CF-SLP 02/26/2023, 9:00 AM

## 2023-02-27 DIAGNOSIS — I10 Essential (primary) hypertension: Secondary | ICD-10-CM | POA: Diagnosis not present

## 2023-02-27 DIAGNOSIS — I639 Cerebral infarction, unspecified: Secondary | ICD-10-CM | POA: Diagnosis not present

## 2023-02-27 MED ORDER — GABAPENTIN 100 MG PO CAPS
100.0000 mg | ORAL_CAPSULE | Freq: Three times a day (TID) | ORAL | Status: DC
  Administered 2023-02-27 – 2023-03-09 (×31): 100 mg via ORAL
  Filled 2023-02-27 (×31): qty 1

## 2023-02-27 NOTE — Progress Notes (Signed)
Physical Therapy Session Note  Patient Details  Name: Kathleen Figueroa MRN: [id number] Date of Birth: [birth year]  Today's Date: 02/27/2023 PT Individual Time: 4098-1191 PT Individual Time Calculation (min): 76 min   Short Term Goals: Week 2:  PT Short Term Goal 1 (Week 2): Pt will perform supine<>sit with min A PT Short Term Goal 2 (Week 2): Pt will perform sit<>stands using LRAD with min A consistently PT Short Term Goal 3 (Week 2): Pt will perform bed<>chair transfers using LRAD with min A PT Short Term Goal 4 (Week 2): Pt will ambulate at least 10ft using LRAD with mod A of 1 and +2 assist as needed  Skilled Therapeutic Interventions/Progress Updates:    Pt received supine in bed, asleep with R lateral trunk lean all the way over to the bedrail. Upon awakening, pt agreeable to therapy session.   Noticed pt to be incontinent of bladder (she was unaware). Rolling R/L in bed using bedrails with intermittent min A for L hemibody management due to sensory deficits and being unaware of its position (therapist guarding L LE to avoid it hitting bedrail when rolling R and protecting it from falling off EOB when rolling L) - therapist providing max/total verbal cuing for sequencing to improve pt's motor planning and coordination of movements - dependent LB clothing management and peri-care.   Supine>sitting L EOB, HOB partially elevated and using bedrail, with skilled heavy min A for trunk upright and L hemibody management - cuing for sequencing.   Sitting EOB with CGA/close SBA for trunk control with verbal cuing to fix posture when starting to have LOB due to poor anticipatory awareness and delayed balance recovery strategies (1x heavy min A to recover due to anterior LOB) while engaging pt in donning LB clothing - total cuing for L visual scanning and locating L hemibody (severe difficulty with this) to thread on pants with max/total A - donning shoes with total A and continued cuing to visually scan  L and locate clothing item (sock/shoe) and L LE to place in the item.  Sit>stand EOB>no UE support with max A for lifting and balance due to strong L posterior lean - continued to provide max cuing for set-up prior to transferring including bringing L LE back underneath BOS and leaning trunk forward. Standing with mod/max A for balance due to either L posterior LOB or anterior LOB while +2 provides total A for pullings pants up over hips and provides CGA for balance safety.   R squat pivot EOB>TIS w/c with skilled heavy min A for lifting/pivoting hips - continued total/max cuing for head/hips relationship, bringing L LE back underneath BOS, and therapist facilitating L UE positioning to avoid injury. Pt does have min recall of how to set-up for transfers.    Transported to/from gym in w/c for time management and energy conservation.    Participated in L visual scanning dual-task of standing, while reaching post-it notes off the back of the mirror. Provided privacy screen to limit visual input from R and midline to promote increased L attention. While sitting, pt demos significant improvement in ability to turn head L and find targets ~45degrees L of midline. When standing with added dual-task balance challenge pt requires increased cuing for L scanning. Sit<>stands with mod/max A for lifting and balance due to L posterior lean then while standing requires varying min to max A for balance due to poor dual-task - therapist also having to provide manual facilitation for L LE positioning due to it rotating  into excessive hip external rotation and hip adduction causing worsening L posterior LOB.  *Pt reporting she is "exhausted" during this exercise requiring seated rest breaks throughout.  Pt incontinent of bladder again - transported back to room and left seated in TIS w/c with NT preset to assume care of patient.    Therapy Documentation Precautions:  Precautions Precautions: Fall, Other  (comment) Precaution Comments: L hemiparesis, L visual field cut, L inattention, L hemisensory loss Restrictions Weight Bearing Restrictions Per Provider Order: No   Pain:  Pt continues to have the "deep ache" pain in L LE, anticipate this is likely due to co-contraction and over activation of L LE muscles due to lack of sensory feedback to know how she is moving that extremity. Also, continues to have lack of L knee flexion ROM due to OA.    Therapy/Group: Individual Therapy  [name] , PT, DPT, NCS, CSRS 02/27/2023, 8:00 AM

## 2023-02-27 NOTE — Progress Notes (Signed)
Speech Language Pathology Daily Session Note  Patient Details  Name: Kathleen Figueroa MRN: [id number] Date of Birth: [birth year]  Today's Date: 02/27/2023 SLP Individual Time: 0901-1000 SLP Individual Time Calculation (min): 59 min  Short Term Goals: Week 2: SLP Short Term Goal 1 (Week 2): Patient will utilize swallowing compensatory strategies to reduce s/sx of aspiration during consumption of PO given min multimodal A SLP Short Term Goal 2 (Week 2): Patient will demonstrate problem solving in basic daily situations given min multimodal A SLP Short Term Goal 3 (Week 2): Patient will recall and utilize memory compensatory strategies given min multimodal A SLP Short Term Goal 4 (Week 2): Patient will attend to L side during functional tasks given mod multimodal A  Skilled Therapeutic Interventions: Skilled therapy session focused on dysphagia and cognitive goals. Upon entrance, nursing administering medications in puree with liquid wash. Patient with immediate coughing upon SLP entrance, though reporting pill was too large. SLP observed remainder of medication administration, with no s/sx of aspiration. Recommend continuation of pills in puree, however with large pills cut in half. SLP targeted cognitive goals through review of memory strategies. Patient independently recalled 4/4 strategies and utilized during paragraph recall task. Patient answered comprehenion questions after 10-15 minute delay with 100% accuracy given mod i A. SLP continued to address cognitive goals through L attention task. Patient was encouraged to sustain attention to L by making eye contact with SLP during conversation regarding therapy progress and goals. Patient with sustained atttention to L side for upwards of 5 minutes with minA. Furthermore, patient located call bell with supervisionA after patient taped callbell in R bed rail. Patient left in bed with alarm set and call bell in reach. Continue POC.   Pain Denies    Therapy/Group: Individual Therapy  Kalyssa Anker M.A., CF-SLP 02/27/2023, 7:37 AM

## 2023-02-27 NOTE — Progress Notes (Signed)
PROGRESS NOTE   Subjective/Complaints:  Pt doing well, slept well, denies pain, LBM 2 days ago per pt but looks like she had several yesterday. Urinating ok, appears to be incontinent. Denies any other complaints or concerns today.   ROS-denies chest pain or shortness of breath.  Denies abdominal pain, n/v/d.  poor sensation left side , L neglect, - pain c/os, no breathing issues   Objective:   No results found.  No results for input(s): "WBC", "HGB", "HCT", "PLT" in the last 72 hours.  No results for input(s): "NA", "K", "CL", "CO2", "GLUCOSE", "BUN", "CREATININE", "CALCIUM" in the last 72 hours.   Intake/Output Summary (Last 24 hours) at 02/27/2023 1054 Last data filed at 02/27/2023 0750 Gross per 24 hour  Intake 580 ml  Output --  Net 580 ml        Physical Exam: Vital Signs Blood pressure 135/76, pulse 79, temperature 98 F (36.7 C), temperature source Oral, resp. rate 18, height 5\' 6"  (1.676 m), weight 63.2 kg, SpO2 99%.   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 No joint swelling No significant left arm tenderness or pain with PROM noted  motor strength is 5/5 in right deltoid, bicep, tricep, grip, hip flexor, knee  extensors, ankle dorsiflexor and plantar flexor Left side difficult to examine due to severe sensory ataxia but at least 4/5 Wandering LUE movements  Sensory exam absent sensation to light touch and proprioception in LEFT upper and lower extremities     Assessment/Plan: 1. Functional deficits which require 3+ hours per day of interdisciplinary therapy in a comprehensive inpatient rehab setting. Physiatrist is providing close team supervision and 24 hour management of active medical problems listed below. Physiatrist and rehab team continue to assess barriers to discharge/monitor patient progress toward functional and medical goals  Care Tool:  Bathing    Body parts bathed by patient: Face, Front perineal area, Left arm, Chest   Body parts bathed by helper: Buttocks, Right upper leg, Left upper leg, Left lower leg, Right lower leg, Front perineal area, Right arm, Left arm, Abdomen     Bathing assist Assist Level: Maximal Assistance - Patient 24 - 49%     Upper Body Dressing/Undressing Upper body dressing   What is the patient wearing?: Pull over shirt    Upper body assist Assist Level: Total Assistance - Patient < 25%    Lower Body Dressing/Undressing Lower body dressing      What is the patient wearing?: Pants, Underwear/pull up     Lower body assist Assist for lower body dressing: Total Assistance - Patient <  25%     Toileting Toileting    Toileting assist Assist for toileting: Total Assistance - Patient < 25%     Transfers Chair/bed transfer  Transfers assist  Chair/bed transfer activity did not occur: Safety/medical concerns (unsafe to get up)  Chair/bed transfer assist level: Moderate Assistance - Patient 50 - 74% (squat pivot)     Locomotion Ambulation   Ambulation assist   Ambulation activity did not occur: Safety/medical concerns (requires skilled assistance)  Assist level: 2 helpers Assistive device: Other (comment) (hand rail) Max distance:  20   Walk 10 feet activity   Assist  Walk 10 feet activity did not occur: Safety/medical concerns  Assist level: 2 helpers Assistive device: Other (comment) (hand rail.)   Walk 50 feet activity   Assist Walk 50 feet with 2 turns activity did not occur: Safety/medical concerns         Walk 150 feet activity   Assist Walk 150 feet activity did not occur: Safety/medical concerns         Walk 10 feet on uneven surface  activity   Assist Walk 10 feet on uneven surfaces activity did not occur: Safety/medical concerns         Wheelchair     Assist Is the patient using a wheelchair?: Yes Type of Wheelchair: Manual    Wheelchair assist level: Total Assistance - Patient < 25% Max wheelchair distance: 64ft    Wheelchair 50 feet with 2 turns activity    Assist        Assist Level: Dependent - Patient 0%   Wheelchair 150 feet activity     Assist      Assist Level: Dependent - Patient 0%   Blood pressure 135/76, pulse 79, temperature 98 F (36.7 C), temperature source Oral, resp. rate 18, height 5\' 6"  (1.676 m), weight 63.2 kg, SpO2 99%.  Medical Problem List and Plan: 1. 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  Antithrombotics: -DVT/anticoagulation:  Pharmaceutical: Heparin change to lovenox to reduce needle stick, no sig renal failure  -antiplatelet therapy: Aspirin and Brilinta for three 90 days followed by aspirin alone   -hx left CEA 3. Pain Management: Tylenol as needed   -1/17 gabapentin 100 mg 3 times daily for neuropathic pain 4.  Mood/Behavior/Sleep: LCSW to evaluate and provide emotional support             -antipsychotic agents: n/a              - Melatonin PRN   5. Neuropsych/cognition: This patient is not quite capable of making decisions on her own behalf.   6. Skin/Wound Care: Routine skin care checks   7. Fluids/Electrolytes/Nutrition: Routine Is and Os and follow-up chemistries   8: Hypertension: monitor TID and prn (Diovan 320 mg daily at home) On avapro 150mg  will increase to 300mg   1/17 fair control continue current regimen and monitor -02/27/23 BPs variable but ok, cont regimen for now Vitals:   02/24/23 0339 02/24/23 0353 02/24/23 0455 02/24/23 1344  BP: (!) 196/83 (!) 160/90 (!) 162/88 (!) 146/85   02/24/23 1959 02/25/23 0516 02/25/23 1256 02/25/23 1933  BP: (!) 143/85 (!) 157/79 137/74 136/63   02/26/23 0457 02/26/23 1250 02/26/23 1959 02/27/23 0313  BP: (!) 149/80 132/61 (!) 156/84 135/76    9: Hyperlipidemia: continue atorvastatin 80mg  daily   10: DM-2: A1c = 6.4% on October, 2024 (no home meds)  -1/16 glucose stable 127 on BMP 1/13  11: Elevated serum creatinine/GFR ~55: ? Mild CKD; near baseline ~1.0             -Monitor on increased ARB , recheck 1/20 -Continue to encourage and monitor fluid intake       Latest Ref Rng & Units 02/22/2023    5:00 AM 02/19/2023    9:41 AM 02/14/2023    6:31 AM  BMP  Glucose 70 - 99 mg/dL 161  096  045   BUN 8 - 23 mg/dL 22  13  13    Creatinine 0.44 - 1.00 mg/dL 4.09  8.11  9.14   Sodium 135 - 145 mmol/L 137  138  136   Potassium 3.5 - 5.1 mmol/L 3.8  3.8  3.5   Chloride 98 - 111 mmol/L 102  100  101   CO2 22 - 32 mmol/L 27  27  25    Calcium 8.9 - 10.3 mg/dL 8.8  8.9  8.3      12. Eye redness  -Artificial tears as needed ordered, continue to monitor  13. Constipation -1/17 appears she had a few bowel movements yesterday however she still reported feeling constipated.  It does appear that she had not had a bowel movement for a few days prior to  this so may still still have some stool in her colon.  Will schedule MiraLAX, add sorbitol as needed -02/27/23 although reporting no BM in 2 days, she had 3 yesterday-- leave meds for now, but if continues to have frequent loose stools, may need to back off   LOS: 9 days A FACE TO FACE EVALUATION WAS PERFORMED  [address] 02/27/2023, 10:54 AM

## 2023-02-27 NOTE — Progress Notes (Signed)
Physical Therapy Session Note  Patient Details  Name: Kathleen Figueroa MRN: [id number] Date of Birth: 20-Feb-1935  Today's Date: 02/26/2023 PT Individual Time:  4098-1191  PT Individual Time Calculation (min): 46 min  Short Term Goals: Week 1:  PT Short Term Goal 1 (Week 1): Pt will perform supine<>sit with min A PT Short Term Goal 1 - Progress (Week 1): Progressing toward goal PT Short Term Goal 2 (Week 1): Pt will perform sit<>stands using LRAD with min A PT Short Term Goal 2 - Progress (Week 1): Progressing toward goal PT Short Term Goal 3 (Week 1): Pt will perform bed<>chair transfers with mod A PT Short Term Goal 3 - Progress (Week 1): Met PT Short Term Goal 4 (Week 1): Pt will ambulate at least 33ft using LRAD with mod A of 1 and +2 assist as needed for safety PT Short Term Goal 4 - Progress (Week 1): Progressing toward goal Week 2:  PT Short Term Goal 1 (Week 2): Pt will perform supine<>sit with min A PT Short Term Goal 2 (Week 2): Pt will perform sit<>stands using LRAD with min A consistently PT Short Term Goal 3 (Week 2): Pt will perform bed<>chair transfers using LRAD with min A PT Short Term Goal 4 (Week 2): Pt will ambulate at least 68ft using LRAD with mod A of 1 and +2 assist as needed  Skilled Therapeutic Interventions/Progress Updates:  Patient supine in bed on entrance to room with towel draped over lower body. Pt on bedpan and relates that she is confused. Patient agreeable to PT session.   Patient with no pain complaint at start of session.  Therapeutic Activity: Bed Mobility: Pt has had BM in bedpan and requires pericare and redressing. She is able to perform roll to each side with extra time to initiate and supervision. Pericare performed by [employer]. New brief donned with MaxA. Pants donned with ModA. Pt performed supine <> sit with assist to initiate legs off EOB and then complete to square self on EOB. Required vc/ tc.   Transfers: Pt performed sit<>stand with MinA  then stand pivot transfer to TIS w/c with MinA and heavy vc/ tc for LLE d/t full knee flexion performed with cue to step backwards. Decreased amount of LLE flexion with just facilitation of weight shift to assist with pivot step.   Gait Training:  Pt ambulated 35 ft using RHR with MinA. Requires heavy cues for reciprocal stepping and block of LLE to midline in order to prevent crossover stepping. Vc for pt to observe L shoe with pink coban applied for contrast and ease of locating visually.  Patient upright in TIS w/c at end of session with brakes locked, belt attached, belt alarm set, and all needs within reach.   Therapy Documentation Precautions:  Precautions Precautions: Fall, Other (comment) Precaution Comments: L hemiparesis, L visual field cut, L inattention, L hemisensory loss Restrictions Weight Bearing Restrictions Per Provider Order: No  Pain:  Pain in L thigh with knee flexion in sitting - described as "brain freeze" in thigh.   Therapy/Group: Individual Therapy  Loel Dubonnet PT, DPT, CSRS 02/27/2023, 6:42 AM

## 2023-02-28 DIAGNOSIS — I639 Cerebral infarction, unspecified: Secondary | ICD-10-CM | POA: Diagnosis not present

## 2023-02-28 DIAGNOSIS — K5901 Slow transit constipation: Secondary | ICD-10-CM

## 2023-02-28 DIAGNOSIS — I10 Essential (primary) hypertension: Secondary | ICD-10-CM | POA: Diagnosis not present

## 2023-02-28 NOTE — Progress Notes (Signed)
PROGRESS NOTE   Subjective/Complaints:  Pt doing well again today, slept well, denies pain, unsure of LBM but thinks it was yesterday or the day before; last documented BMs were 3 on 02/26/23. Urinating ok,  incontinent. Denies any other complaints or concerns today.   ROS-denies chest pain or shortness of breath.  Denies abdominal pain, n/v/d.  poor sensation left side , L neglect, - pain c/os, no breathing issues   Objective:   No results found.  No results for input(s): "WBC", "HGB", "HCT", "PLT" in the last 72 hours.  No results for input(s): "NA", "K", "CL", "CO2", "GLUCOSE", "BUN", "CREATININE", "CALCIUM" in the last 72 hours.   Intake/Output Summary (Last 24 hours) at 02/28/2023 1026 Last data filed at 02/28/2023 0800 Gross per 24 hour  Intake 834 ml  Output --  Net 834 ml        Physical Exam: Vital Signs Blood pressure (!) 148/68, pulse 71, temperature 98.5 F (36.9 C), temperature source Oral, resp. rate 18, height 5\' 6"  (1.676 m), weight 63.2 kg, SpO2 99%.   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 No joint swelling No significant left arm tenderness or pain with PROM noted  motor strength is 5/5 in right deltoid,  bicep, tricep, grip, hip flexor, knee extensors, ankle dorsiflexor and plantar flexor Left side difficult to examine due to severe sensory ataxia but at least 4/5 Wandering LUE movements  Sensory exam absent sensation to light touch and proprioception in LEFT upper and lower extremities     Assessment/Plan: 1. Functional deficits which require 3+ hours per day of interdisciplinary therapy in a comprehensive inpatient rehab setting. Physiatrist is providing close team supervision and 24 hour management of active medical problems listed below. Physiatrist and rehab team continue to assess barriers to discharge/monitor patient progress toward functional and medical goals  Care Tool:  Bathing    Body parts bathed by patient: Face, Front perineal area, Left arm, Chest   Body parts bathed by helper: Buttocks, Right upper leg, Left upper leg, Left lower leg, Right lower leg, Front perineal area, Right arm, Left arm, Abdomen     Bathing assist Assist Level: Maximal Assistance - Patient 24 - 49%     Upper Body Dressing/Undressing Upper body dressing   What is the patient wearing?: Pull over shirt    Upper body assist Assist Level: Total Assistance - Patient < 25%    Lower Body Dressing/Undressing Lower body dressing      What is the patient wearing?: Pants, Underwear/pull up     Lower body assist Assist for lower body  dressing: Total Assistance - Patient < 25%     Toileting Toileting    Toileting assist Assist for toileting: Total Assistance - Patient < 25%     Transfers Chair/bed transfer  Transfers assist  Chair/bed transfer activity did not occur: Safety/medical concerns (unsafe to get up)  Chair/bed transfer assist level: Moderate Assistance - Patient 50 - 74% (squat pivot)     Locomotion Ambulation   Ambulation assist   Ambulation activity did not occur: Safety/medical concerns (requires skilled assistance)  Assist level: 2 helpers Assistive device: Other  (comment) (hand rail) Max distance: 20   Walk 10 feet activity   Assist  Walk 10 feet activity did not occur: Safety/medical concerns  Assist level: 2 helpers Assistive device: Other (comment) (hand rail.)   Walk 50 feet activity   Assist Walk 50 feet with 2 turns activity did not occur: Safety/medical concerns         Walk 150 feet activity   Assist Walk 150 feet activity did not occur: Safety/medical concerns         Walk 10 feet on uneven surface  activity   Assist Walk 10 feet on uneven surfaces activity did not occur: Safety/medical concerns         Wheelchair     Assist Is the patient using a wheelchair?: Yes Type of Wheelchair: Manual    Wheelchair assist level: Total Assistance - Patient < 25% Max wheelchair distance: 25ft    Wheelchair 50 feet with 2 turns activity    Assist        Assist Level: Dependent - Patient 0%   Wheelchair 150 feet activity     Assist      Assist Level: Dependent - Patient 0%   Blood pressure (!) 148/68, pulse 71, temperature 98.5 F (36.9 C), temperature source Oral, resp. rate 18, height 5\' 6"  (1.676 m), weight 63.2 kg, SpO2 99%.  Medical Problem List and Plan: 1. 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  Antithrombotics: -DVT/anticoagulation:  Pharmaceutical: Heparin change to lovenox to reduce needle stick, no sig renal failure  -antiplatelet therapy: Aspirin and Brilinta for three 90 days followed by aspirin alone   -hx left CEA 3. Pain Management: Tylenol as needed   -1/17 gabapentin 100 mg  3 times daily for neuropathic pain-- helping 4. Mood/Behavior/Sleep: LCSW to evaluate and provide emotional support             -antipsychotic agents: n/a              - Melatonin PRN   5. Neuropsych/cognition: This patient is not quite capable of making decisions on her own behalf.   6. Skin/Wound Care: Routine skin care checks   7. Fluids/Electrolytes/Nutrition: Routine Is and Os and follow-up chemistries   8: Hypertension: monitor TID and prn (Diovan 320 mg daily at home) On avapro 150mg  will increase to 300mg   1/17 fair control continue current regimen and monitor -1/18-19/25 BPs variable but ok, cont regimen for now Vitals:  02/24/23 1344 02/24/23 1959 02/25/23 0516 02/25/23 1256  BP: (!) 146/85 (!) 143/85 (!) 157/79 137/74   02/25/23 1933 02/26/23 0457 02/26/23 1250 02/26/23 1959  BP: 136/63 (!) 149/80 132/61 (!) 156/84   02/27/23 0313 02/27/23 1308 02/27/23 1941 02/28/23 0332  BP: 135/76 (!) 111/90 (!) 157/71 (!) 148/68    9: Hyperlipidemia: continue atorvastatin 80mg  daily   10: DM-2: A1c = 6.4% on October, 2024 (no home meds)  -1/16 glucose stable 127 on BMP 1/13  11: Elevated serum creatinine/GFR ~55: ? Mild CKD; near baseline ~1.0             -Monitor on increased ARB , recheck 1/20 -Continue to encourage and monitor fluid intake       Latest Ref Rng & Units 02/22/2023    5:00 AM 02/19/2023    9:41 AM 02/14/2023    6:31 AM  BMP  Glucose 70 - 99 mg/dL 161  096  045   BUN 8 - 23 mg/dL 22  13  13    Creatinine 0.44 - 1.00 mg/dL 4.09  8.11  9.14   Sodium 135 - 145 mmol/L 137  138  136   Potassium 3.5 - 5.1 mmol/L 3.8  3.8  3.5   Chloride 98 - 111 mmol/L 102  100  101   CO2 22 - 32 mmol/L 27  27  25    Calcium 8.9 - 10.3 mg/dL 8.8  8.9  8.3      12. Eye redness  -Artificial tears as needed ordered, continue to monitor  13. Constipation -1/17 appears she had a few bowel movements yesterday however she still reported feeling constipated.  It does appear that she  had not had a bowel movement for a few days prior to this so may still still have some stool in her colon.  Will schedule MiraLAX, add sorbitol as needed -02/27/23 although reporting no BM in 2 days, she had 3 yesterday-- leave meds for now, but if continues to have frequent loose stools, may need to back off -02/28/23 no BM since 1/17, monitor closely for BMs today, adjust meds as needed   LOS: 10 days A FACE TO FACE EVALUATION WAS PERFORMED  [address] 02/28/2023, 10:26 AM

## 2023-03-01 DIAGNOSIS — N3942 Incontinence without sensory awareness: Secondary | ICD-10-CM | POA: Diagnosis not present

## 2023-03-01 DIAGNOSIS — I10 Essential (primary) hypertension: Secondary | ICD-10-CM | POA: Diagnosis not present

## 2023-03-01 DIAGNOSIS — I63331 Cerebral infarction due to thrombosis of right posterior cerebral artery: Secondary | ICD-10-CM | POA: Diagnosis not present

## 2023-03-01 DIAGNOSIS — I69398 Other sequelae of cerebral infarction: Secondary | ICD-10-CM | POA: Diagnosis not present

## 2023-03-01 LAB — CBC
HCT: 38.9 % (ref 36.0–46.0)
Hemoglobin: 12.6 g/dL (ref 12.0–15.0)
MCH: 28.1 pg (ref 26.0–34.0)
MCHC: 32.4 g/dL (ref 30.0–36.0)
MCV: 86.6 fL (ref 80.0–100.0)
Platelets: 242 10*3/uL (ref 150–400)
RBC: 4.49 MIL/uL (ref 3.87–5.11)
RDW: 14.6 % (ref 11.5–15.5)
WBC: 6 10*3/uL (ref 4.0–10.5)
nRBC: 0 % (ref 0.0–0.2)

## 2023-03-01 LAB — BASIC METABOLIC PANEL
Anion gap: 9 (ref 5–15)
BUN: 26 mg/dL — ABNORMAL HIGH (ref 8–23)
CO2: 22 mmol/L (ref 22–32)
Calcium: 8.9 mg/dL (ref 8.9–10.3)
Chloride: 105 mmol/L (ref 98–111)
Creatinine, Ser: 1.31 mg/dL — ABNORMAL HIGH (ref 0.44–1.00)
GFR, Estimated: 40 mL/min — ABNORMAL LOW (ref >60)
Glucose, Bld: 125 mg/dL — ABNORMAL HIGH (ref 70–99)
Potassium: 4.3 mmol/L (ref 3.5–5.1)
Sodium: 136 mmol/L (ref 135–145)

## 2023-03-01 MED ORDER — POLYETHYLENE GLYCOL 3350 17 G PO PACK
17.0000 g | PACK | Freq: Two times a day (BID) | ORAL | Status: DC
  Administered 2023-03-01 – 2023-03-14 (×26): 17 g via ORAL
  Filled 2023-03-01 (×28): qty 1

## 2023-03-01 NOTE — Progress Notes (Signed)
PROGRESS NOTE   Subjective/Complaints:  No issues overnite   ROS-denies chest pain or shortness of breath.  Denies abdominal pain, n/v/d.  poor sensation left side , L neglect, - pain c/os, no breathing issues   Objective:   No results found.  Recent Labs    03/01/23 0508  WBC 6.0  HGB 12.6  HCT 38.9  PLT 242    Recent Labs    03/01/23 0508  NA 136  K 4.3  CL 105  CO2 22  GLUCOSE 125*  BUN 26*  CREATININE 1.31*  CALCIUM 8.9     Intake/Output Summary (Last 24 hours) at 03/01/2023 1024 Last data filed at 03/01/2023 1610 Gross per 24 hour  Intake 829 ml  Output --  Net 829 ml        Physical Exam: Vital Signs Blood pressure (!) 142/73, pulse 72, temperature 98.8 F (37.1 C), temperature source Oral, resp. rate 18, height 5\' 6"  (1.676 m), weight 63.2 kg, SpO2 96%.  General: No acute distress Mood and affect are appropriate Heart: Regular rate and rhythm no rubs murmurs or extra sounds Lungs: Clear to auscultation, breathing unlabored, no rales or wheezes Abdomen: Positive bowel sounds, soft nontender to palpation, nondistended Extremities: No clubbing, cyanosis, or edema Skin: No evidence of breakdown, no evidence of rash  Musculoskeletal: Full range of motion in all 4 extremities. 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 No joint swelling No significant left arm tenderness or pain with PROM noted  motor strength is 5/5 in right deltoid, bicep, tricep, grip, hip flexor, knee extensors, ankle dorsiflexor and plantar flexor Left side difficult to examine due to severe sensory ataxia but at least 4/5 Wandering LUE movements  Sensory exam absent sensation to light touch and proprioception in LEFT upper and lower  extremities     Assessment/Plan: 1. Functional deficits which require 3+ hours per day of interdisciplinary therapy in a comprehensive inpatient rehab setting. Physiatrist is providing close team supervision and 24 hour management of active medical problems listed below. Physiatrist and rehab team continue to assess barriers to discharge/monitor patient progress toward functional and medical goals  Care Tool:  Bathing    Body parts bathed by patient: Face, Front perineal area, Left arm, Chest   Body parts bathed by helper: Buttocks, Right upper leg, Left upper leg, Left lower leg, Right lower leg, Front perineal area, Right arm, Left arm, Abdomen     Bathing assist Assist Level: Maximal Assistance - Patient 24 - 49%     Upper Body Dressing/Undressing Upper body dressing   What is the patient wearing?: Pull over shirt    Upper body assist Assist Level: Total Assistance - Patient < 25%    Lower Body Dressing/Undressing Lower body dressing      What is the patient wearing?: Pants, Underwear/pull up     Lower body assist Assist for lower body dressing: Total Assistance - Patient < 25%     Toileting Toileting    Toileting assist Assist for toileting:  Total Assistance - Patient < 25%     Transfers Chair/bed transfer  Transfers assist  Chair/bed transfer activity did not occur: Safety/medical concerns (unsafe to get up)  Chair/bed transfer assist level: Moderate Assistance - Patient 50 - 74% (squat pivot)     Locomotion Ambulation   Ambulation assist   Ambulation activity did not occur: Safety/medical concerns (requires skilled assistance)  Assist level: 2 helpers Assistive device: Other (comment) (hand rail) Max distance: 20   Walk 10 feet activity   Assist  Walk 10 feet activity did not occur: Safety/medical concerns  Assist level: 2 helpers Assistive device: Other (comment) (hand rail.)   Walk 50 feet activity   Assist Walk 50 feet with 2 turns  activity did not occur: Safety/medical concerns         Walk 150 feet activity   Assist Walk 150 feet activity did not occur: Safety/medical concerns         Walk 10 feet on uneven surface  activity   Assist Walk 10 feet on uneven surfaces activity did not occur: Safety/medical concerns         Wheelchair     Assist Is the patient using a wheelchair?: Yes Type of Wheelchair: Manual    Wheelchair assist level: Total Assistance - Patient < 25% Max wheelchair distance: 93ft    Wheelchair 50 feet with 2 turns activity    Assist        Assist Level: Dependent - Patient 0%   Wheelchair 150 feet activity     Assist      Assist Level: Dependent - Patient 0%   Blood pressure (!) 142/73, pulse 72, temperature 98.8 F (37.1 C), temperature source Oral, resp. rate 18, height 5\' 6"  (1.676 m), weight 63.2 kg, SpO2 96%.  Medical Problem List and Plan: 1. 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  Antithrombotics: -DVT/anticoagulation:  Pharmaceutical: Heparin change to lovenox to reduce needle stick, no sig renal failure  -antiplatelet therapy: Aspirin and Brilinta for three 90 days followed by aspirin alone   -hx left CEA 3. Pain Management: Tylenol as needed   -1/17 gabapentin 100 mg 3 times daily for neuropathic pain-- helping 4. Mood/Behavior/Sleep: LCSW to evaluate and provide emotional support             -antipsychotic agents: n/a              - Melatonin PRN   5. Neuropsych/cognition: This patient is not quite capable of making decisions on her own  behalf.   6. Skin/Wound Care: Routine skin care checks   7. Fluids/Electrolytes/Nutrition: Routine Is and Os and follow-up chemistries   8: Hypertension: monitor TID and prn (Diovan 320 mg daily at home) On avapro 150mg  will increase to 300mg   1/17 fair control continue current regimen and monitor -1/18-19/25 BPs variable but ok, cont regimen for now Vitals:   02/25/23 1256 02/25/23 1933 02/26/23 0457 02/26/23 1250  BP: 137/74 136/63 (!) 149/80 132/61   02/26/23 1959 02/27/23 4010  02/27/23 1308 02/27/23 1941  BP: (!) 156/84 135/76 (!) 111/90 (!) 157/71   02/28/23 0332 02/28/23 1253 02/28/23 2119 03/01/23 0307  BP: (!) 148/68 119/69 (!) 147/74 (!) 142/73    9: Hyperlipidemia: continue atorvastatin 80mg  daily   10: DM-2: A1c = 6.4% on October, 2024 (no home meds)  -1/16 glucose stable 127 on BMP 1/13  11: Elevated serum creatinine/GFR ~55: ? Mild CKD; near baseline ~1.0             -Monitor on increased ARB , recheck 1/20 -Continue to encourage and monitor fluid intake       Latest Ref Rng & Units 03/01/2023    5:08 AM 02/22/2023    5:00 AM 02/19/2023    9:41 AM  BMP  Glucose 70 - 99 mg/dL 161  096  045   BUN 8 - 23 mg/dL 26  22  13    Creatinine 0.44 - 1.00 mg/dL 4.09  8.11  9.14   Sodium 135 - 145 mmol/L 136  137  138   Potassium 3.5 - 5.1 mmol/L 4.3  3.8  3.8   Chloride 98 - 111 mmol/L 105  102  100   CO2 22 - 32 mmol/L 22  27  27    Calcium 8.9 - 10.3 mg/dL 8.9  8.8  8.9      12. Eye redness  -Artificial tears as needed ordered, continue to monitor  13. Constipation -1/17 appears she had a few bowel movements yesterday however she still reported feeling constipated.  It does appear that she had not had a bowel movement for a few days prior to this so may still still have some stool in her colon.  Will schedule MiraLAX, add sorbitol as needed -02/27/23 although reporting no BM in 2 days, she had 3 yesterday-- leave meds for now, but if continues to have frequent loose  stools, may need to back off -02/28/23 no BM since 1/17, monitor closely for BMs today, adjust meds as needed 1/20 last BM 1/18 will need to increase miralax to BID  LOS: 11 days A FACE TO FACE EVALUATION WAS PERFORMED  Erick Colace 03/01/2023, 10:24 AM

## 2023-03-01 NOTE — Progress Notes (Signed)
Occupational Therapy Weekly Progress Note  Patient Details  Name: Kathleen Figueroa MRN: [id number] Date of Birth: October 16, 1935  Beginning of progress report period: February 19, 2023 End of progress report period: March 01, 2023  Today's Date: 03/01/2023 OT Individual Time: 4098-1191 OT Individual Time Calculation (min): 44 min    Patient has met 1 of 3 short term goals. Patient functioning currently at max A U/LB BADLs (including toileting), and skilled Min/mod squat pivot transfers to toilet. Pt continues to be most limited by significant L-sided attention and L hemibody sensation/proprioceptive deficits.   Patient continues to demonstrate the following deficits: muscle weakness and muscle joint tightness, decreased cardiorespiratoy endurance, impaired timing and sequencing, abnormal tone, unbalanced muscle activation, motor apraxia, ataxia, decreased coordination, and decreased motor planning, decreased visual perceptual skills and decreased visual motor skills, decreased midline orientation and decreased attention to left, decreased awareness and decreased memory, and decreased standing balance, decreased postural control, and decreased balance strategies and therefore will continue to benefit from skilled OT intervention to enhance overall performance with BADL and Reduce care partner burden.  Patient progressing toward long term goals..  Continue plan of care.  OT Short Term Goals Week 1:  OT Short Term Goal 1 (Week 1): Pt will maintain sitting balance with CGA during BADLs OT Short Term Goal 1 - Progress (Week 1): Met OT Short Term Goal 2 (Week 1): Pt will locate 3/3 items in L visual field with min verbal cues OT Short Term Goal 2 - Progress (Week 1): Progressing toward goal OT Short Term Goal 3 (Week 1): Pt will recall hemi-dressing technique with min questioning cues OT Short Term Goal 3 - Progress (Week 1): Progressing toward goal OT Short Term Goal 4 (Week 1): Pt will complete  functional transfers to toilet LRAD mod A OT Short Term Goal 4 - Progress (Week 1): Met Week 2:  OT Short Term Goal 1 (Week 2): Pt will locate 3/3 items in L visual field with min verbal cues. OT Short Term Goal 2 (Week 2): Pt will recall hemi-dressing techniques with min questioning cues. OT Short Term Goal 3 (Week 2): Pt will integrate LUE into functional tasks with Mod multimodal cuing. OT Short Term Goal 4 (Week 2): Pt will identify at least two visual compensatory techniques with min questioning cues.  Skilled Therapeutic Interventions/Progress Updates:  Pt greeted sitting in TIS WC for skilled OT session with focus on dynamic sitting balance and LUE NMR. Stedy utilized for sit<>stand (Min A) and unsupported sitting at [employer]. In said position, pt tasked with the following activities to challenge dynamic sitting balance, bring attention to L-side, and integrate LUE into functional taskes, details below: Reaching across midline with RUE to retrieve bean bags held at varying heights and distances into patient's L-visual field. (X5 reps) Reaching anterior with LUE to retrieve and transports bean bags to WC placed in L-side. (X5 reps) Pt requires Max multimodal cuing for attention to L-side, requiring/benefiting from [facility] assistance of L-hand to decrease ataxia movements and management bean bag. Maintaining sitting balance with close supervision-CGA (with reaching outside BOS).  Seated back in TIS WC, pt instructed to retrieve bean bag from R EOB with R-hand, pass it to her L-hand, and then pass it to therapist on L-side. Pt reports increased difficulty with this version of the activity, but able to perform x5 reps with x2 drops.   Pt remained sitting in TIS WC (seat belt donned) with 4Ps assessed and immediate needs met. Pt continues to  be appropriate for skilled OT intervention to promote further functional independence in ADLs/IADLs.   Therapy Documentation Precautions:   Precautions Precautions: Fall, Other (comment) Precaution Comments: L hemiparesis, L visual field cut, L inattention, L hemisensory loss Restrictions Weight Bearing Restrictions Per Provider Order: No   Therapy/Group: Individual Therapy  Lou Cal, OTR/L, MSOT  03/01/2023, 6:26 AM

## 2023-03-01 NOTE — Progress Notes (Signed)
Speech Language Pathology Daily Session Note  Patient Details  Name: Kathleen Figueroa MRN: [id number] Date of Birth: 1935/11/11  Today's Date: 03/01/2023 SLP Individual Time: 4401-0272 SLP Individual Time Calculation (min): 61 min  Short Term Goals: Week 2: SLP Short Term Goal 1 (Week 2): Patient will utilize swallowing compensatory strategies to reduce s/sx of aspiration during consumption of PO given min multimodal A SLP Short Term Goal 2 (Week 2): Patient will demonstrate problem solving in basic daily situations given min multimodal A SLP Short Term Goal 3 (Week 2): Patient will recall and utilize memory compensatory strategies given min multimodal A SLP Short Term Goal 4 (Week 2): Patient will attend to L side during functional tasks given mod multimodal A  Skilled Therapeutic Interventions:  Patient was seen in PM to address cognitive re- training. Pt was alert and seated upright in WC upon SLP arrival. She verbalized recent medical history along with PLOF PTA with sup to min A. Pt's dtr arriving during session and clarifying opposing information. SLP challenged pt in problem solving and L inattention this date. Pt oriented to situation and place however not oriented to month, day of week, holiday, or inauguration. Max verbal and visual cues necessary to locate temporal information on external electronic device. Pt challenged in L inattention through matching cards spread across table in a FO8. Pt warranting cues ranging from min to max A to attend L visual field. In other minutes of session, SLP addressed problem solving through challenging pt to identify solutions to problems within current environment. Pt identified solutions with 67% acc with min to mod A. Pt's dtr with questions regarding current diet. SLP reviewed hx of diet changes leading back to evaluation and current rationale for D2 textures. Pt declined PO trials this date however; both pt and dtr insist no overt s/s asp observed. SLP  recommending further instrumental assessment to r/o asp and for possible diet upgrade. At conclusion of session pt was left upright in [facility] with chair alarm active, call button within reach, and family members present. SLP to continue POC.   Pain Pain Assessment Pain Scale: 0-10 Pain Score: 0-No pain  Therapy/Group: Individual Therapy  [name] 03/01/2023, 3:40 PM

## 2023-03-01 NOTE — Progress Notes (Signed)
Physical Therapy Session Note  Patient Details  Name: Kathleen Figueroa MRN: [id number] Date of Birth: 25-Apr-1935  Today's Date: 03/01/2023 PT Individual Time: 4034-7425 PT Individual Time Calculation (min): 70 min   Short Term Goals: Week 1:  PT Short Term Goal 1 (Week 1): Pt will perform supine<>sit with min A PT Short Term Goal 1 - Progress (Week 1): Progressing toward goal PT Short Term Goal 2 (Week 1): Pt will perform sit<>stands using LRAD with min A PT Short Term Goal 2 - Progress (Week 1): Progressing toward goal PT Short Term Goal 3 (Week 1): Pt will perform bed<>chair transfers with mod A PT Short Term Goal 3 - Progress (Week 1): Met PT Short Term Goal 4 (Week 1): Pt will ambulate at least 63ft using LRAD with mod A of 1 and +2 assist as needed for safety PT Short Term Goal 4 - Progress (Week 1): Progressing toward goal Week 2:  PT Short Term Goal 1 (Week 2): Pt will perform supine<>sit with min A PT Short Term Goal 2 (Week 2): Pt will perform sit<>stands using LRAD with min A consistently PT Short Term Goal 3 (Week 2): Pt will perform bed<>chair transfers using LRAD with min A PT Short Term Goal 4 (Week 2): Pt will ambulate at least 77ft using LRAD with mod A of 1 and +2 assist as needed  Skilled Therapeutic Interventions/Progress Updates:   Received pt semi-reclined in bed, pt agreeable to PT treatment, and reported pain in L thigh described as "brain freeze". Session with emphasis on functional mobility/transfers, dressing, hygiene management, generalized strengthening and endurance, NMR, L attention/awareness, and dynamic standing balance/coordination. Pt initially clean then suddenly had incontinent bowel and bladder episode before therapist could retreive bedpan. Rolled L/R with min A and use of bedrails with cues for sequencing/attention numerous times. Removed soiled brief, performed hygiene management dependently, and donned clean brief. Donned leggings in supine with max A +2  (due to availability) and transferred supine<>sitting L EOB from flat bed using bedrails with min A - noted posterior lean in sitting requiring CGA and cues to correct. Doffed dirty shirt and donned clean one with max A, then transferred bed<>TIS WC via squat<>pivot with mod A +2 due to posterior and L lateral bias and difficulty following cues. Pt able to scoot hips back in [facility] without assist on R side and with max A on L side. Washed face sitting in WC at sink with supervision and shoes with max A.  Pt transported to/from room in TIS [facility] dependently for time management purposes. Donned standing frame harness seated with max A and stood in standing frame and worked on dynamic standing balance/coordination, midline orientation, L attention/visual scanning, and reaching outside BOS placing/removing squigz from [employer] Noted ataxia in LUE and pt required max/total A to manage LUE with emphasis on weight bearing through LUE, coordination, and stretching into elbow/wrist/digit extension. Pt required intermittent assist to position LLE due to tendency for L hip/knee to abduct and L foot ER. Returned to room and concluded session with pt sitting in TIS WC, needs within reach, and seatbelt and seatbelt alarm on.   Therapy Documentation Precautions:  Precautions Precautions: Fall, Other (comment) Precaution Comments: L hemiparesis, L visual field cut, L inattention, L hemisensory loss Restrictions Weight Bearing Restrictions Per Provider Order: No  Therapy/Group: Individual Therapy Marlana Salvage Zaunegger Blima Rich PT, DPT 03/01/2023, 7:04 AM

## 2023-03-01 NOTE — Progress Notes (Signed)
Patient's daughter said that what they need is a power of attorney for financial matters. Daughter also said her mother who is the patient can make her own health care decision right now.  They are not asking for AD.  Chaplain told them that we do not provide a power of attorney document for financial matters. Chaplain explained advance directive document but daughter declined.  Mother was in room and conscious.  She let her daughter do the talking. Family is aware that chaplains are available if needed. No follow up needed at this time.  Chaplain Daron Offer

## 2023-03-02 DIAGNOSIS — I63331 Cerebral infarction due to thrombosis of right posterior cerebral artery: Secondary | ICD-10-CM | POA: Diagnosis not present

## 2023-03-02 DIAGNOSIS — I10 Essential (primary) hypertension: Secondary | ICD-10-CM | POA: Diagnosis not present

## 2023-03-02 DIAGNOSIS — I69398 Other sequelae of cerebral infarction: Secondary | ICD-10-CM | POA: Diagnosis not present

## 2023-03-02 DIAGNOSIS — N3942 Incontinence without sensory awareness: Secondary | ICD-10-CM | POA: Diagnosis not present

## 2023-03-02 NOTE — Progress Notes (Signed)
Speech Language Pathology Daily Session Note  Patient Details  Name: [name] MRN: [id number] Date of Birth: Sep 01, 1935  Today's Date: 03/02/2023 SLP Individual Time: 3086-5784 SLP Individual Time Calculation (min): 61 min  Short Term Goals: Week 2: SLP Short Term Goal 1 (Week 2): Patient will utilize swallowing compensatory strategies to reduce s/sx of aspiration during consumption of PO given min multimodal A SLP Short Term Goal 2 (Week 2): Patient will demonstrate problem solving in basic daily situations given min multimodal A SLP Short Term Goal 3 (Week 2): Patient will recall and utilize memory compensatory strategies given min multimodal A SLP Short Term Goal 4 (Week 2): Patient will attend to L side during functional tasks given mod multimodal A  Skilled Therapeutic Interventions:  Patient was seen in PM to address cognitive re- training. Pt was easily alerted upon SLP arrival and agreeable for session. SLP challenged pt's left inattention through functional task( I.e. locating glasses, phone, tissue etc). Utilizing a visual anchor pt identified objects on the left side with sup to min A. Without visual anchor and verbal and auditory guidance pt requiring max A. In additional minutes of session, SLP engaged pt in reminiscent therapy. Pt talked about meeting her husband, her mother, and living in [place] given sup to min A for thoroughness. SLP also addressed problem solving through challenging pt in identifying solutions within current environment. Given scenarios presented verbally, she identified solutions with 83% acc and min A. At conclusion of session, pt was left seated upright in [facility] with call button within reahc. SLP to continue POC.   Pain Pain Assessment Pain Scale: 0-10 Pain Score: 0-No pain  Therapy/Group: Individual Therapy  [name] 03/02/2023, 3:36 PM

## 2023-03-02 NOTE — Progress Notes (Signed)
PROGRESS NOTE   Subjective/Complaints:  Pt has dry mouth this am but no other c/os  ROS-denies chest pain or shortness of breath.  Denies abdominal pain, n/v/d.  poor sensation left side , L neglect, - pain c/os, no breathing issues   Objective:   No results found.  Recent Labs    03/01/23 0508  WBC 6.0  HGB 12.6  HCT 38.9  PLT 242    Recent Labs    03/01/23 0508  NA 136  K 4.3  CL 105  CO2 22  GLUCOSE 125*  BUN 26*  CREATININE 1.31*  CALCIUM 8.9     Intake/Output Summary (Last 24 hours) at 03/02/2023 0845 Last data filed at 03/01/2023 1800 Gross per 24 hour  Intake 474 ml  Output --  Net 474 ml        Physical Exam: Vital Signs Blood pressure 128/69, pulse 76, temperature 97.8 F (36.6 C), resp. rate 17, height 5\' 6"  (1.676 m), weight 63.2 kg, SpO2 98%.  General: No acute distress Mood and affect are appropriate Heart: Regular rate and rhythm no rubs murmurs or extra sounds Lungs: Clear to auscultation, breathing unlabored, no rales or wheezes Abdomen: Positive bowel sounds, soft nontender to palpation, nondistended Extremities: No clubbing, cyanosis, or edema Skin: No evidence of breakdown, no evidence of rash Speech with moderate dysarthria  Musculoskeletal: Full range of motion in all 4 extremities. 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 No joint swelling No significant left arm tenderness or pain with PROM noted  motor strength is 5/5 in right deltoid, bicep, tricep, grip, hip flexor, knee extensors, ankle dorsiflexor and plantar flexor Left side difficult to examine due to severe sensory ataxia but at least 4/5 Wandering LUE movements  Sensory exam absent sensation to light touch and proprioception in LEFT upper and lower  extremities     Assessment/Plan: 1. Functional deficits which require 3+ hours per day of interdisciplinary therapy in a comprehensive inpatient rehab setting. Physiatrist is providing close team supervision and 24 hour management of active medical problems listed below. Physiatrist and rehab team continue to assess barriers to discharge/monitor patient progress toward functional and medical goals  Care Tool:  Bathing    Body parts bathed by patient: Face, Front perineal area, Left arm, Chest   Body parts bathed by helper: Buttocks, Right upper leg, Left upper leg, Left lower leg, Right lower leg, Front perineal area, Right arm, Left arm, Abdomen     Bathing assist Assist Level: Maximal Assistance - Patient 24 - 49%     Upper Body Dressing/Undressing Upper body dressing   What is the patient wearing?: Pull over shirt    Upper body assist Assist Level: Total Assistance - Patient < 25%    Lower Body Dressing/Undressing Lower body dressing      What is the patient wearing?: Pants, Underwear/pull up     Lower body assist Assist for lower body dressing: Total Assistance - Patient < 25%     Toileting Toileting    Toileting assist Assist  for toileting: Total Assistance - Patient < 25%     Transfers Chair/bed transfer  Transfers assist  Chair/bed transfer activity did not occur: Safety/medical concerns (unsafe to get up)  Chair/bed transfer assist level: Moderate Assistance - Patient 50 - 74% (squat pivot)     Locomotion Ambulation   Ambulation assist   Ambulation activity did not occur: Safety/medical concerns (requires skilled assistance)  Assist level: 2 helpers Assistive device: Other (comment) (hand rail) Max distance: 20   Walk 10 feet activity   Assist  Walk 10 feet activity did not occur: Safety/medical concerns  Assist level: 2 helpers Assistive device: Other (comment) (hand rail.)   Walk 50 feet activity   Assist Walk 50 feet with 2 turns  activity did not occur: Safety/medical concerns         Walk 150 feet activity   Assist Walk 150 feet activity did not occur: Safety/medical concerns         Walk 10 feet on uneven surface  activity   Assist Walk 10 feet on uneven surfaces activity did not occur: Safety/medical concerns         Wheelchair     Assist Is the patient using a wheelchair?: Yes Type of Wheelchair: Manual    Wheelchair assist level: Total Assistance - Patient < 25% Max wheelchair distance: 74ft    Wheelchair 50 feet with 2 turns activity    Assist        Assist Level: Dependent - Patient 0%   Wheelchair 150 feet activity     Assist      Assist Level: Dependent - Patient 0%   Blood pressure 128/69, pulse 76, temperature 97.8 F (36.6 C), resp. rate 17, height 5\' 6"  (1.676 m), weight 63.2 kg, SpO2 98%.  Medical Problem List and Plan: 1. 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  Antithrombotics: -DVT/anticoagulation:  Pharmaceutical: Heparin change to lovenox to reduce needle stick, no sig renal failure  -antiplatelet therapy: Aspirin and Brilinta for three 90 days followed by aspirin alone   -hx left CEA 3. Pain Management: Tylenol as needed   -1/17 gabapentin 100 mg 3 times daily for neuropathic pain-- helping 4. Mood/Behavior/Sleep: LCSW to evaluate and provide emotional support             -antipsychotic agents: n/a              - Melatonin PRN   5. Neuropsych/cognition: This patient is not quite capable of making decisions on her own behalf.   6. Skin/Wound  Care: Routine skin care checks   7. Fluids/Electrolytes/Nutrition: Routine Is and Os and follow-up chemistries   8: Hypertension: monitor TID and prn (Diovan 320 mg daily at home) On avapro 150mg  will increase to 300mg   1/17 fair control continue current regimen and monitor -1/18-19/25 BPs variable but ok, cont regimen for now Vitals:   02/26/23 1250 02/26/23 1959 02/27/23 0313 02/27/23 1308  BP: 132/61 (!) 156/84 135/76 (!) 111/90   02/27/23 1941 02/28/23 0332 02/28/23  1253 02/28/23 2119  BP: (!) 157/71 (!) 148/68 119/69 (!) 147/74   03/01/23 0307 03/01/23 1600 03/01/23 1948 03/02/23 0323  BP: (!) 142/73 130/72 (!) 154/80 128/69    9: Hyperlipidemia: continue atorvastatin 80mg  daily   10: DM-2: A1c = 6.4% on October, 2024 (no home meds)  -1/16 glucose stable 127 on BMP 1/13  11: Elevated serum creatinine/GFR ~55: ? Mild CKD; near baseline ~1.0             -Monitor on increased ARB , recheck 1/20 -Continue to encourage and monitor fluid intake       Latest Ref Rng & Units 03/01/2023    5:08 AM 02/22/2023    5:00 AM 02/19/2023    9:41 AM  BMP  Glucose 70 - 99 mg/dL 782  956  213   BUN 8 - 23 mg/dL 26  22  13    Creatinine 0.44 - 1.00 mg/dL 0.86  5.78  4.69   Sodium 135 - 145 mmol/L 136  137  138   Potassium 3.5 - 5.1 mmol/L 4.3  3.8  3.8   Chloride 98 - 111 mmol/L 105  102  100   CO2 22 - 32 mmol/L 22  27  27    Calcium 8.9 - 10.3 mg/dL 8.9  8.8  8.9      12. Eye redness  -Artificial tears as needed ordered, continue to monitor  13. Constipation -1/17 appears she had a few bowel movements yesterday however she still reported feeling constipated.  It does appear that she had not had a bowel movement for a few days prior to this so may still still have some stool in her colon.  Will schedule MiraLAX, add sorbitol as needed -02/27/23 although reporting no BM in 2 days, she had 3 yesterday-- leave meds for now, but if continues to have frequent loose stools, may need to back  off -02/28/23 no BM since 1/17, monitor closely for BMs today, adjust meds as needed 1/20 last BM 1/18 will need to increase miralax to BID  LOS: 12 days A FACE TO FACE EVALUATION WAS PERFORMED  Erick Colace 03/02/2023, 8:45 AM

## 2023-03-02 NOTE — Progress Notes (Signed)
Occupational Therapy Session Note  Patient Details  Name: Kathleen Figueroa MRN: [id number] Date of Birth: 1935-12-19  Today's Date: 03/02/2023 OT Individual Time: 4696-2952 OT Individual Time Calculation (min): 69 min    Short Term Goals: Week 2:  OT Short Term Goal 1 (Week 2): Pt will locate 3/3 items in L visual field with min verbal cues. OT Short Term Goal 2 (Week 2): Pt will recall hemi-dressing techniques with min questioning cues. OT Short Term Goal 3 (Week 2): Pt will integrate LUE into functional tasks with Mod multimodal cuing. OT Short Term Goal 4 (Week 2): Pt will identify at least two visual compensatory techniques with min questioning cues.  Skilled Therapeutic Interventions/Progress Updates:     Pt received sitting up in wc dressed for the day upon OT arrival. Pt presenting to be in good spirits receptive to skilled OT session reporting 0/10 pain- OT offering intermittent rest breaks, repositioning, and therapeutic support to optimize participation in therapy session. Focus this session L side attention, standing tolerance, dynamic balance, and functional reaching.   Transported Pt total A to therapy gym in [facility] for energy conservation and time management. While seated in wc, work on L side attention by having Pt locate and retreive large items positioned on Pt's L side. Mod/max verbal cues required to turn head to L to locate items, retrieve items using R UE reaching across midline, and passing items to therapist. During activity, Pt was able to locate L UE with min questioning cues with noted improvement to L hemibody location.   Donned 2.5# wrist weight onto Pt's L UE for increased proprioceptive feedback to increase Pt's awareness to L UE positioning. Engaged Pt in functional reaching across midline towards target using L UE with trace improvement noted with wrist weight donned d/t significance of Pt's sensory and proprioceptive deficits. Guided Pt through completing L UE NMRE  pushing/pulling exercises with OT providing gentle resistance with able to complete 10 reps of scapular protraction/retraction in combination with elbow flexion/extension in closed chain position with improved control noted in this position vs open chain.   Pt completed squat pivot transfer TISWC>EOM to L with min A and min questioning cues required to recall correct head/hip relationship.   Positioned large vertical mirror anterior to Pt for increased visual feedback to support improved awareness of body alignment and L side attention. Sitting EOM, engaged Pt in dynamic sitting balance visual scanning activity to increased L side awareness and increased dynamic sitting balance for safety during BADLs. Pt instructed to turn head to L to locate specified colored cone, reach across midline using R UE to retrieve cones, and then anteriorly flex trunk to stack cones on floor anterior to Pt. Pt able to complete 9 trial with CGA-min A provided for dynamic sitting balance, mod verbal cues for visual scanning, and mod verbal cues for trunk alignment.   With +2 assistance from rehab tech, engaged Pt in dynamic standing balance visual scanning activity using EVA walker to work on standing tolerance for BADLs, facilitate WB'ing through L U/LEs, and to increase attention to L. Pt instructed to maintain balance in EVA walker with L UE supported on platform and utilize R UE to retrieve colored hoops and place the hoops on the matching colored squigz positioned on the L side of the vertical mirror. Pt able to tolerate standing 2 minutes x2 trials during task with OT stabilizing L U/LE position d/t proprioceptive deficits and consistent verbal cues provided for hip/trunk positioning to support improved body  alignment, with seated rest break provided between trials. Mod-max verbal cues required to locate squigz on far left portion of mirror.   Squat pivot EOM>TISWC to the R min a with min questioning cues required to recall  head/hip relationship. Transported Pt back to room total A in wc for time management and energy conservation. Pt was left resting in TISWC with call bell in reach, seatbelt alarm on, and all needs met.    Therapy Documentation Precautions:  Precautions Precautions: Fall, Other (comment) Precaution Comments: L hemiparesis, L visual field cut, L inattention, L hemisensory loss Restrictions Weight Bearing Restrictions Per Provider Order: No  Therapy/Group: Individual Therapy  Clide Deutscher 03/02/2023, 1:11 PM

## 2023-03-02 NOTE — Progress Notes (Signed)
Physical Therapy Session Note  Patient Details  Name: Kathleen Figueroa MRN: [id number] Date of Birth: Nov 21, 1935  Today's Date: 03/02/2023 PT Individual Time: 4098-1191 and 4782-9562 PT Individual Time Calculation (min): 40 min and 30 min  Short Term Goals: Week 2:  PT Short Term Goal 1 (Week 2): Pt will perform supine<>sit with min A PT Short Term Goal 2 (Week 2): Pt will perform sit<>stands using LRAD with min A consistently PT Short Term Goal 3 (Week 2): Pt will perform bed<>chair transfers using LRAD with min A PT Short Term Goal 4 (Week 2): Pt will ambulate at least 68ft using LRAD with mod A of 1 and +2 assist as needed  Skilled Therapeutic Interventions/Progress Updates:    Session 1: Pt received supported upright in bed eating breakfast with NT assist, pt requesting for additional time to finish meal. Pt then agreeable to therapy session and eager to participate. Pt with high spirits throughout session smiling and singing.   Supine>sitting L EOB, HOB partially elevated and using bedrail, with CGA and 1x strong min A due to L anterior LOB while requiring step-by-step cuing for sequencing and orienting herself.   Sitting EOB, with close SBA for sitting balance safety and 1x strong L anterior LOB requiring min A for recovery while participating in LB clothing management - therapist providing cuing and encouragement for visually scanning L and to visually identify all of the items (pants, socks, shoes, etc). Requires max/total A to thread on LB clothing with cuing for pt to assist with this using R UE and pt able to thread R LE into pants (this is when the anterior LOB occurs when leaning forward). Total A for donning socks and shoes.   Sit>stand EOB>no AD with skilled mod A for balance due to L posterior lean - pulled pants up over hips with max A while cuing pt for improved upright, midline posture.  R squat pivot EOB>TIS w/c with skilled min A for lifting/pivoting hips - pt with min  difficulty verbally recalling the transfer sequence but her motor memory is good, with pt initiating anterior trunk lean and scooting hips with facilitation.    Transported to/from gym in w/c for time management and energy conservation.  Sit>stands TIS w/c>R UE support on hallway rail with pt having good verbal recall of how to sequence this transfer - getting her L LE back underneath her BOS, placing R arm up on handrail, and leaning trunk forward.  Gait training ~76ft x2 (seated break) using R UE support on hallway rail with skilled mod assist for balance and L LE management and +2 w/c follow. Pt demonstrating the following gait deviations with therapist providing the described cuing and facilitation for improvement:  - provide step-by-step cuing for sequencing LE stepping - requires mod manual facilitation with every L LE swing phase advancement to ensure safe LE placement to avoid excessive hip adduction/scissoring -- while therapist facilitating R weight shift onto R stance limb to allow increased time for L swing advancement and safe placement at initial contact - requires light min A to guard L knee during stance with 2x slight giving way  - pt achieves reciprocal pattern consistently today with improved step length bilaterally and more consistent step length  Transported back to room in TIS w/c and pt agreeable to remain sitting up. Ensured pt's soft call button in her R hand. Pt left with needs in reach, seat belt, and seat belt alarm in place.   Session 2: Pt received sitting in  TIS w/c with her daughter, Odelia Gage, present and pt talking on the phone. Pt agreeable to therapy session.   Therapist educated pt and daughter on the following:  - pt's primary deficits of severe L hemisensory impairments, L inattention, and visual perceptual and likely visual acuity changes as well as how these impact her mobility - educated on scheduled wheelchair consult for next Tuesday 1/28 at 11:00AM  -  educated pt and daughter that pt will likely need a wheelchair to achieve increased independence with functional mobility due to above impairments impacting her ability to safely participate in ambulation currently requiring skilled assistance for gait training - educated on pt's need for very explicit verbal cuing to assist with motor planning and sequencing of tasks to decrease pt's need for physical assistance   Pt and daughter both very engaged during education. Pt's daughter observed remainder of therapy session.    Transported to/from gym in w/c for time management and energy conservation.  Sit>stand w/c>R UE support on hallway rail with skilled min A and continued reinforcement of above set-up and sequencing of the transfer.  Gait training ~16ft using R hallway rail with skilled mod A for L LE management and balance with +2 w/c follow - continues to safely achieve reciprocal stepping pattern with therapist facilitating L LE positioning during swing to avoid excessive adduction (continues to "kick" LE forward with knee extended) - continuing to facilitate R weight shift during R stance and verbal cuing for upright trunk posture.   Transported back to room. L squat pivot TIS w/c>EOB with skilled min A for lifting/pivoting hips with therapist continuing to provide mod/max A cuing for sequencing set-up, L LE positioning, and head/hips relationship.  Sit>supine with light min A for L LE management. Pt's daughter appreciative of getting to observe pt during therapy and discuss pt's impairments.  Pt left supine in bed with needs in reach, bed alarm on, and her daughter present.   Therapy Documentation Precautions:  Precautions Precautions: Fall, Other (comment) Precaution Comments: L hemiparesis, L visual field cut, L inattention, L hemisensory loss Restrictions Weight Bearing Restrictions Per Provider Order: No   Pain:  Session 1: Continues to report the aching pain in her L LE when she  moves it, likely due to co-contraction with impaired sensory feedback compounded on her L knee OA limiting knee flexion AROM. Therapist providing cuing to protect her L LE movements during session.  Session 2: Continues to have the pain in her L LE as described above with pain primarily when moving the L LE to bring it back underneath her BOS prior to initiating transfers.    Therapy/Group: Individual Therapy  [name] , PT, DPT, NCS, CSRS 03/02/2023, 7:53 AM

## 2023-03-02 NOTE — Progress Notes (Addendum)
Patient ID: Kathleen Figueroa, female   DOB: 1935/09/17, 88 y.o.   MRN: [id number]  Spoke with [name] via telephone to discuss difference between SNF and ALF. He has gotten [facility] to assist him in looking for a place for her and pursuing options. Team conference tomorrow will update after.  3;16 PM have completed FL2 for son who is looking at ALF versus SNF. They want pt to receive the most rehab and see who she recovers from her CVA. Aware team conference tomorrow

## 2023-03-02 NOTE — [place] FL2 (Signed)
[place] MEDICAID FL2 LEVEL OF CARE FORM     IDENTIFICATION  Patient Name: Kathleen Figueroa Birthdate: 19-Jan-1936 Sex: female Admission Date (Current Location): 02/18/2023  [facility] and [place] Number:  Producer, television/film/video and Address:  [facility] [place], [address] [place], [place] [address]      Provider Number: [id number]  Attending Physician Name and Address:  Erick Colace, MD  Relative Name and Phone Number:  Pati Gallo [phone]  [name] [phone]    Current Level of Care: Other (Comment) (rehab) Recommended Level of Care: Skilled Nursing Facility Prior Approval Number:    Date Approved/Denied:   PASRR Number: [id number]  Discharge Plan: SNF    Current Diagnoses: Patient Active Problem List   Diagnosis Date Noted   Stage 2 chronic kidney disease 02/20/2023   Diabetes mellitus (HCC) 02/20/2023   Primary hypertension 02/20/2023   CVA (cerebral vascular accident) (HCC) 02/18/2023   History of CVA (cerebrovascular accident) 02/11/2023   Acute CVA (cerebrovascular accident) (HCC) 02/11/2023   Hypokalemia 11/12/2022   S/P carotid endarterectomy 05/28/2014   Carotid artery disease (HCC) 01/14/2014   Essential hypertension    Hyperlipidemia 01/12/2014   Diabetes mellitus without complication (HCC)     Orientation RESPIRATION BLADDER Height & Weight     Self, Place, Situation  Normal Incontinent Weight: 139 lb 5.3 oz (63.2 kg) Height:  5\' 6"  (167.6 cm)  BEHAVIORAL SYMPTOMS/MOOD NEUROLOGICAL BOWEL NUTRITION STATUS      Continent Diet (Dys 2 thin liquds)  AMBULATORY STATUS COMMUNICATION OF NEEDS Skin   Extensive Assist Verbally Normal                       Personal Care Assistance Level of Assistance  Bathing, Feeding, Dressing Bathing Assistance: Limited assistance Feeding assistance: Limited assistance Dressing Assistance: Limited assistance     Functional Limitations Warehouse manager, Speech Sight  Info: Impaired   Speech Info: Impaired    SPECIAL CARE FACTORS FREQUENCY  PT (By licensed PT), OT (By licensed OT), Bowel and bladder program, Speech therapy     PT Frequency: 5x week OT Frequency: 5x week Bowel and Bladder Program Frequency: Timed tolieting   Speech Therapy Frequency: 5x week      Contractures Contractures Info: Not present    Additional Factors Info  Code Status, Allergies Code Status Info: Full Code Allergies Info: Aspirin           Current Medications (03/02/2023):  This is the current hospital active medication list Current Facility-Administered Medications  Medication Dose Route Frequency Provider Last Rate Last Admin   acetaminophen (TYLENOL) tablet 325-650 mg  325-650 mg Oral Q4H PRN Milinda Antis, PA-C   650 mg at 02/23/23 0404   alum & mag hydroxide-simeth (MAALOX/MYLANTA) 200-200-20 MG/5ML suspension 30 mL  30 mL Oral Q4H PRN Milinda Antis, PA-C       aspirin EC tablet 81 mg  81 mg Oral Daily Milinda Antis, PA-C   81 mg at 03/02/23 0859   atorvastatin (LIPITOR) tablet 80 mg  80 mg Oral Daily Milinda Antis, PA-C   80 mg at 03/02/23 4132   bisacodyl (DULCOLAX) EC tablet 5 mg  5 mg Oral Daily PRN Fanny Dance, MD       enoxaparin (LOVENOX) injection 40 mg  40 mg Subcutaneous Q24H Kirsteins, Victorino Sparrow, MD   40 mg at 03/02/23 1133   gabapentin (NEURONTIN) capsule 100 mg  100 mg Oral TID Natale Lay,  Benjie Karvonen, MD   100 mg at 03/02/23 0859   guaiFENesin-dextromethorphan (ROBITUSSIN DM) 100-10 MG/5ML syrup 10 mL  10 mL Oral Q6H PRN Milinda Antis, PA-C       irbesartan (AVAPRO) tablet 300 mg  300 mg Oral Daily Erick Colace, MD   300 mg at 03/02/23 4098   melatonin tablet 5 mg  5 mg Oral QHS PRN Milinda Antis, PA-C       methocarbamol (ROBAXIN) tablet 500 mg  500 mg Oral Q6H PRN Milinda Antis, PA-C   500 mg at 02/22/23 1717   ondansetron (ZOFRAN) tablet 4 mg  4 mg Oral Q6H PRN Milinda Antis, PA-C       Or   ondansetron  [facility]) injection 4 mg  4 mg Intravenous Q6H PRN Setzer, Lynnell Jude, PA-C       polyethylene glycol (MIRALAX / GLYCOLAX) packet 17 g  17 g Oral BID Erick Colace, MD   17 g at 03/02/23 1191   polyvinyl alcohol (LIQUIFILM TEARS) 1.4 % ophthalmic solution 2 drop  2 drop Left Eye PRN Fanny Dance, MD       sodium phosphate (FLEET) enema 1 enema  1 enema Rectal Once PRN Setzer, Lynnell Jude, PA-C       sorbitol 70 % solution 30 mL  30 mL Oral Daily PRN Fanny Dance, MD       ticagrelor [facility]) tablet 90 mg  90 mg Oral BID Milinda Antis, PA-C   90 mg at 03/02/23 4782     Discharge Medications: Please see discharge summary for a list of discharge medications.  Relevant Imaging Results:  Relevant Lab Results:   Additional Information SSN: [id number]  Katiria Calame, Lemar Livings, LCSW

## 2023-03-03 NOTE — Progress Notes (Signed)
PROGRESS NOTE   Subjective/Complaints:  FL2 form signed yesterday , PT notes left knee pain and limited ROM   ROS-denies chest pain or shortness of breath.  Denies abdominal pain, n/v/d.  poor sensation left side , L neglect, - pain c/os, no breathing issues   Objective:   No results found.  Recent Labs    03/01/23 0508  WBC 6.0  HGB 12.6  HCT 38.9  PLT 242    Recent Labs    03/01/23 0508  NA 136  K 4.3  CL 105  CO2 22  GLUCOSE 125*  BUN 26*  CREATININE 1.31*  CALCIUM 8.9     Intake/Output Summary (Last 24 hours) at 03/03/2023 0831 Last data filed at 03/03/2023 0806 Gross per 24 hour  Intake 832 ml  Output --  Net 832 ml        Physical Exam: Vital Signs Blood pressure (!) 192/90, pulse 78, temperature 97.6 F (36.4 C), resp. rate 18, height 5\' 6"  (1.676 m), weight 63.2 kg, SpO2 100%.  General: No acute distress Mood and affect are appropriate Heart: Regular rate and rhythm no rubs murmurs or extra sounds Lungs: Clear to auscultation, breathing unlabored, no rales or wheezes Abdomen: Positive bowel sounds, soft nontender to palpation, nondistended Extremities: No clubbing, cyanosis, or edema Skin: No evidence of breakdown, no evidence of rash Speech with moderate dysarthria  Musculoskeletal: limited Left knee flexion to 90 degrees . 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 No joint swelling No significant left arm tenderness or pain with PROM noted  motor strength is 5/5 in right deltoid, bicep, tricep, grip, hip flexor, knee extensors, ankle dorsiflexor and plantar flexor Left side difficult to examine due to severe sensory ataxia but at least 4/5 Wandering LUE movements  Sensory exam absent sensation to light touch and  proprioception in LEFT upper and lower extremities     Assessment/Plan: 1. Functional deficits which require 3+ hours per day of interdisciplinary therapy in a comprehensive inpatient rehab setting. Physiatrist is providing close team supervision and 24 hour management of active medical problems listed below. Physiatrist and rehab team continue to assess barriers to discharge/monitor patient progress toward functional and medical goals  Care Tool:  Bathing    Body parts bathed by patient: Face, Front perineal area, Left arm, Chest   Body parts bathed by helper: Buttocks, Right upper leg, Left upper leg, Left lower leg, Right lower leg, Front perineal area, Right arm, Left arm, Abdomen     Bathing assist Assist Level: Maximal Assistance - Patient 24 - 49%     Upper Body Dressing/Undressing Upper body dressing   What is the patient wearing?: Pull over shirt    Upper body assist Assist Level: Total Assistance - Patient < 25%    Lower Body Dressing/Undressing Lower body dressing      What is the patient wearing?: Pants, Underwear/pull up     Lower body assist Assist for lower body dressing: Total Assistance - Patient < 25%     Toileting Toileting  Toileting assist Assist for toileting: Total Assistance - Patient < 25%     Transfers Chair/bed transfer  Transfers assist  Chair/bed transfer activity did not occur: Safety/medical concerns (unsafe to get up)  Chair/bed transfer assist level: Minimal Assistance - Patient > 75% (squat pivot)     Locomotion Ambulation   Ambulation assist   Ambulation activity did not occur: Safety/medical concerns (requires skilled assistance)  Assist level: 2 helpers Assistive device: Other (comment) (R hallway rail) Max distance: 37ft   Walk 10 feet activity   Assist  Walk 10 feet activity did not occur: Safety/medical concerns  Assist level: 2 helpers Assistive device: Other (comment) (hand rail.)   Walk 50 feet  activity   Assist Walk 50 feet with 2 turns activity did not occur: Safety/medical concerns         Walk 150 feet activity   Assist Walk 150 feet activity did not occur: Safety/medical concerns         Walk 10 feet on uneven surface  activity   Assist Walk 10 feet on uneven surfaces activity did not occur: Safety/medical concerns         Wheelchair     Assist Is the patient using a wheelchair?: Yes Type of Wheelchair: Manual    Wheelchair assist level: Total Assistance - Patient < 25% Max wheelchair distance: 77ft    Wheelchair 50 feet with 2 turns activity    Assist        Assist Level: Dependent - Patient 0%   Wheelchair 150 feet activity     Assist      Assist Level: Dependent - Patient 0%   Blood pressure (!) 192/90, pulse 78, temperature 97.6 F (36.4 C), resp. rate 18, height 5\' 6"  (1.676 m), weight 63.2 kg, SpO2 100%.  Medical Problem List and Plan: 1. 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  Antithrombotics: -DVT/anticoagulation:  Pharmaceutical: lovenox 40mg  qd -antiplatelet therapy: Aspirin and Brilinta for three 90 days followed by aspirin alone   -hx left CEA 3. Pain Management: Tylenol as needed   -1/17 gabapentin 100 mg 3 times daily for neuropathic pain-- helping Severe left knee OA with contracture , pain ok unless knee is flexed to >90deg  4. Mood/Behavior/Sleep: LCSW to evaluate and provide emotional support             -antipsychotic agents: n/a              - Melatonin PRN   5. Neuropsych/cognition: This patient is  not quite capable of making decisions on her own behalf.   6. Skin/Wound Care: Routine skin care checks   7. Fluids/Electrolytes/Nutrition: Routine Is and Os and follow-up chemistries   8: Hypertension: monitor TID and prn (Diovan 320 mg daily at home) On avapro 150mg  will increase to 300mg   1/17 fair control continue current regimen and monitor -1/21 elevated BP this am and yest pm , increase amlodipine to 10mg   Vitals:   02/27/23 1308 02/27/23 1941 02/28/23 0332 02/28/23 1253  BP: (!) 111/90 (!) 157/71 (!) 148/68 119/69   02/28/23 2119 03/01/23 0307 03/01/23 1600 03/01/23 1948  BP: (!) 147/74 (!) 142/73 130/72 (!) 154/80   03/02/23 0323 03/02/23 1243 03/02/23 1933 03/03/23 0524  BP: 128/69 111/68 (!) 181/83 (!) 192/90    9: Hyperlipidemia: continue atorvastatin 80mg  daily   10: DM-2: A1c = 6.4% on October, 2024 (no home meds)  -1/16 glucose stable 127 on BMP 1/13  11: Elevated serum creatinine/GFR ~55: ? Mild CKD; near baseline ~1.0             -Monitor on increased ARB , recheck 1/20 -Continue to encourage and monitor fluid intake       Latest Ref Rng & Units 03/01/2023    5:08 AM 02/22/2023    5:00 AM 02/19/2023    9:41 AM  BMP  Glucose 70 - 99 mg/dL 086  578  469   BUN 8 - 23 mg/dL 26  22  13    Creatinine 0.44 - 1.00 mg/dL 6.29  5.28  4.13   Sodium 135 - 145 mmol/L 136  137  138   Potassium 3.5 - 5.1 mmol/L 4.3  3.8  3.8   Chloride 98 - 111 mmol/L 105  102  100   CO2 22 - 32 mmol/L 22  27  27    Calcium 8.9 - 10.3 mg/dL 8.9  8.8  8.9      12. Eye redness improved   -Artificial tears as needed ordered, continue to monitor  13. Constipation -1/17 appears she had a few bowel movements yesterday however she still reported feeling constipated.  It does appear that she had not had a bowel movement for a few days prior to this so may still still have some stool in her colon.  Will schedule MiraLAX, add sorbitol as needed -02/27/23 although reporting no BM in 2 days, she had  3 yesterday-- leave meds for now, but if continues to have frequent loose stools, may need to back off -02/28/23 no BM since 1/17, monitor closely for BMs today, adjust meds as needed 1/20 last BM 1/18 will need to increase miralax to BID 1/21 cont BM at 2309 LOS: 13 days A FACE TO FACE EVALUATION WAS PERFORMED  Erick Colace 03/03/2023, 8:31 AM

## 2023-03-03 NOTE — Plan of Care (Signed)
  Problem: Consults Goal: RH STROKE PATIENT EDUCATION Description: See Patient Education module for education specifics  Outcome: Progressing   Problem: RH BOWEL ELIMINATION Goal: RH STG MANAGE BOWEL WITH ASSISTANCE Description: STG Manage Bowel with MOD I Assistance. Outcome: Progressing Goal: RH STG MANAGE BOWEL W/MEDICATION W/ASSISTANCE Description: STG Manage Bowel with Medication with MOD I Assistance. Outcome: Progressing   Problem: RH BLADDER ELIMINATION Goal: RH STG MANAGE BLADDER WITH ASSISTANCE Description: STG Manage Bladder With toileting Assistance Outcome: Progressing   Problem: RH SAFETY Goal: RH STG ADHERE TO SAFETY PRECAUTIONS W/ASSISTANCE/DEVICE Description: STG Adhere to Safety Precautions With Cues  Outcome: Progressing   Problem: RH KNOWLEDGE DEFICIT Goal: RH STG INCREASE KNOWLEDGE OF DIABETES Description: Patient and daughter will be able to manage independently with educational resources provided Outcome: Progressing Goal: RH STG INCREASE KNOWLEDGE OF HYPERTENSION Outcome: Progressing Goal: RH STG INCREASE KNOWLEGDE OF HYPERLIPIDEMIA Description: Patient and daughter will be able to manage independently with educational resources provided Outcome: Progressing Goal: RH STG INCREASE KNOWLEDGE OF STROKE PROPHYLAXIS Description: Patient and daughter will be able to manage independently with educational resources provided Outcome: Progressing   Problem: RH Vision Goal: RH LTG Vision (Specify) Outcome: Progressing

## 2023-03-03 NOTE — Progress Notes (Signed)
Physical Therapy Session Note  Patient Details  Name: Kathleen Figueroa MRN: [id number] Date of Birth: October 01, 1935  Today's Date: 03/03/2023 PT Individual Time: 0805-0849 PT Individual Time Calculation (min): 44 min   Short Term Goals: Week 2:  PT Short Term Goal 1 (Week 2): Pt will perform supine<>sit with min A PT Short Term Goal 2 (Week 2): Pt will perform sit<>stands using LRAD with min A consistently PT Short Term Goal 3 (Week 2): Pt will perform bed<>chair transfers using LRAD with min A PT Short Term Goal 4 (Week 2): Pt will ambulate at least 60ft using LRAD with mod A of 1 and +2 assist as needed  Skilled Therapeutic Interventions/Progress Updates:    Pt received supported upright in bed with NT present assisting pt with finishing breakfast and pt agreeable to therapy session. Supine>sitting L EOB, HOB flat but using bedrails, with CGA for safety and max verbal cuing for sequencing/technique to increase independence. Sitting EOB, with close SBA/CGA for balance safety and no instances of LOB today while donning shirt mod A with improving awareness and motor planning, threading on pants mod A (able to thread on R LE with verbal cuing), and donning shoes total A. Sit>stand EOB>no UE support, with mod A for lifting/balance due to L posterior lean while pulling pants up over hips with +2 assist for clothing management - pt's L LE starts to adduct and externally rotate underneath her BOS while in standing due to lack of sensation and pt unaware of its movements.  R squat pivot transfer EOB>TIS w/c with min A for lifting/pivoting hips with cuing for bringing L LE underneath BOS, head/hips relationship, and proper R hand placement.  Vitals assessed thorughout session as follows:  - supine in bed to start: BP 150/90 (MAP 108), HR 76bpm  - after transfer to w/c: BP 164/83 (MAP 104), HR 82bpm    Transported to/from gym in w/c for time management and energy conservation.  Sit>stand TIS w/c>R UE  support on hallway rail with min A and continued reinforcement of cuing to scoot hips forward, bring L foot back underneath BOS, and lean trunk forward while rising to stand.   Gait training 57ft using R hallway rail with skilled mod assist for balance and L LE management with +2 w/c follow. Pt demonstrating the following gait deviations with therapist providing the described cuing and facilitation for improvement:  - continuing to provide step-by-step cuing for sequencing LE steps ("right, left") - requires mod manual facilitation with every L LE swing phase advancement to ensure safe LE placement to avoid excessive hip adduction/scissoring -- while therapist facilitating R weight shift onto R stance limb to allow increased time for L swing advancement and safe placement at initial contact -- provided verbal cuing to increase L hip abduction during swing advancement but then pt over-shoots with excessive abduction  - requires light min A to guard L knee during stance for safety, but no instability today - pt varying in reciprocal vs step-to pattern leading with L LE today due to attempts at cuing for pt to improve L LE foot placement during swing with less physical assistance  Transported back to room and pt left seated in TIS w/c with needs in reach, seat belt, and seat belt alarm in place with soft call button in R hand.   Therapy Documentation Precautions:  Precautions Precautions: Fall, Other (comment) Precaution Comments: L hemiparesis, L visual field cut, L inattention, L hemisensory loss Restrictions Weight Bearing Restrictions Per Provider Order: No  Pain:  Continues to have the "aching" pain in L LE likely due to knee OA combined with impaired coordination/control of the movements with muscle co-contraction.    Therapy/Group: Individual Therapy  [name] , PT, DPT, NCS, CSRS 03/03/2023, 7:45 AM

## 2023-03-03 NOTE — Progress Notes (Signed)
Speech Language Pathology Daily Session Note  Patient Details  Name: Kathleen Figueroa MRN: [id number] Date of Birth: [birth year]  Today's Date: 03/03/2023 SLP Individual Time: 0900-0929 SLP Individual Time Calculation (min): 29 min  Short Term Goals: Week 2: SLP Short Term Goal 1 (Week 2): Patient will utilize swallowing compensatory strategies to reduce s/sx of aspiration during consumption of PO given min multimodal A SLP Short Term Goal 2 (Week 2): Patient will demonstrate problem solving in basic daily situations given min multimodal A SLP Short Term Goal 3 (Week 2): Patient will recall and utilize memory compensatory strategies given min multimodal A SLP Short Term Goal 4 (Week 2): Patient will attend to L side during functional tasks given mod multimodal A  Skilled Therapeutic Interventions:  Patient was seen in am to address cognitive re- training. Pt was alert and seated upright in WC upon SLP arrival. She was agreeable for session. Utilizing a visual anchor (brightly colored strip of paper) implemented in previous session. Pt challenged to identify items on left side of tray table and move them to right. Pt benefiting from min A throughout task to find anchor with good return demo. In other minutes of session SLP addressed abstract reasoning in a compare and contrast activity utilizing a card showing binary objects placed side by side. Again, utilizing visual anchor pt identified objects on left side of card with min A. Pt answered reasoning questions with 83% acc and sup to min A. At conclusion of session, pt was left seated upright in [facility] with call button within reach. SLP to continue POC.  Pain Pain Assessment Pain Scale: 0-10 Pain Score: 0-No pain  Therapy/Group: Individual Therapy  [name] 03/03/2023, 11:10 AM

## 2023-03-03 NOTE — Progress Notes (Signed)
Occupational Therapy Session Note  Patient Details  Name: Kathleen Figueroa MRN: [id number] Date of Birth: 10-30-1935  Today's Date: 03/03/2023 OT Individual Time: 1105-1200 OT Individual Time Calculation (min): 55 min  OT Individual Time: 1433-1530 OT Individual Time Calculation (min): 57 min   Short Term Goals: Week 2:  OT Short Term Goal 1 (Week 2): Pt will locate 3/3 items in L visual field with min verbal cues. OT Short Term Goal 2 (Week 2): Pt will recall hemi-dressing techniques with min questioning cues. OT Short Term Goal 3 (Week 2): Pt will integrate LUE into functional tasks with Mod multimodal cuing. OT Short Term Goal 4 (Week 2): Pt will identify at least two visual compensatory techniques with min questioning cues.  Skilled Therapeutic Interventions/Progress Updates:     AM Session: Pt received lightly sleeping in TISWC waking upon OT arrival. Pt presenting to be in good spirits receptive to skilled OT session reporting unrated pain in L knee- OT offering intermittent rest breaks, repositioning, and therapeutic support to optimize participation in therapy session. Focus this session BADL retraining with emphasis on dynamic sitting balance, L attention, hemi-technique use, and functional transfer training. Transported Pt total A into BR in TISWC for energy conservation. Educated Pt on stand step technique using grab bars with Pt able to complete stand step TISWC>TTB positioned in walking shower with light mod A for balance and for L LE positioning d/t sensory deficits. Pt maintained standing balance with min A while holding grab bar while OT doffed pants from waist. Pt utilized hemi-technique to doff shirt with mod verbal cues provided for problem solving and awareness. OT positioned on Pt's L side throughout shower and presenting toiletry items from the L to facilitate visual scanning and attention to L visual field. Pt maintained sitting balance with close supervision during UB bathing  completing task with mod verbal cues and assistance required only to wash R UE and underarm. Pt completed LB bathing with min A this session- leaning posteriorly to wash anterior peri-areas, anteriorly leaning with CGA to wash feet/lower B LEs, and maintaining dynamic sitting balance supervision while washing upper B LEs. With B UEs positioned on grab bar, Pt stood with min A (OT stabilizing L LE to maintain position) while OT washed Pt's buttocks. Pt completed stand step TTB>TISWC with mod A and max verbal cues for technique and awareness. Pt with significantly improved L hemi-body attention and sequencing this session- Pt consistently following verbal cues during BADLs. U/LB dressing completed from [facility] in bedroom. Pt recalled hemi-dressing techniques with min verbal cues. Assistance required to weave L UE into shirt d/t proprioceptive deficits with pt then able to bring shirt overhead and weave R UE with step by step verbal cues. Pt donned pants with max A with assistance required to weave B LEs d/t tight fit of pants and sitting balance deficits. Pt stood with min A and maintained standing balance with light mod A while OT brought pants to waist. Pt was left resting in TISWC with call bell in reach, seatbelt alarm on, and all needs met.    PM Session:  Pt received lightly sleeping in bed waking upon OT arrival. Pt presenting to be in good spirits receptive to skilled OT session reporting pain without numerical value given in L knee 2/2 arthritis- OT offering intermittent rest breaks, repositioning, and therapeutic support to optimize participation in therapy session. Offered to assist pt to bathroom to follow timed toileting schedule with Pt initially declining need to use restroom, however  after transferring to EOB Pt urgently reporting need to void. Supine>EOB using bed rails light min A and mod verbal cues for technique. Donned shoes sitting EOB total A for time management and utilized STEDY for transport  to bathroom d/t urgencey. Pt able to complete sit<>stands in STEDY with min A and maintain balance during toileting min A with B UE supported on stedy grab bar and OT assisting with maintaining L UE positioning. Increased time provided on toilet, Pt with continent void- nursing staff informed. 3/3 toileting tasks completed with max A to allow Pt to focus on balance and trunk alignment for improved midline orientation. Utilized STEDY to transport Pt from bathroom to [facility] with Pt able to maintain perched sitting balance with CGA during transport. Transported Pt total A to therapy gym in [facility] for time management and energy conservation. Squat pivot TISWC>EOM to R min A with mod verbal cues for technique. Engaged Pt in dynamic standing balance functional reaching and visual scanning activity to increase Pt's attention to the L, facilitate WB'ing through L U/LEs, and improve Pt's dynamic balance. +2 assist provided from therapy tech to assist with EVA walker management and increase Pt's safety. Positioned large vertical mirror anterior to pt for increased external visual feedback to support improved body awareness. Positioned squigz on L side of mirror with Pt instructed to maintain balance in EVA walker while visually scanning to the L to locate specified colored squigz, retrieve squigz, and pass to OT positioned on Pt's L. Pt able to tolerate standing ~3 minutes x2 trials with prolonged seated rest breaks provided between trials. Min A provided for standing balance with OT stabilizing L LE position d/t proprioceptive deficits and mod verbal cues for hip/trunk positioning. Squat pivot EOM>TISWC to L min A. Transported Pt back to room total A in TISWC. Pt was left resting in TISWC with call bell in reach, seatbelt alarm on, and all needs met.    Therapy Documentation Precautions:  Precautions Precautions: Fall, Other (comment) Precaution Comments: L hemiparesis, L visual field cut, L inattention, L hemisensory  loss Restrictions Weight Bearing Restrictions Per Provider Order: No   Therapy/Group: Individual Therapy  Clide Deutscher 03/03/2023, 7:57 AM

## 2023-03-04 DIAGNOSIS — I69398 Other sequelae of cerebral infarction: Secondary | ICD-10-CM | POA: Diagnosis not present

## 2023-03-04 DIAGNOSIS — N3942 Incontinence without sensory awareness: Secondary | ICD-10-CM | POA: Diagnosis not present

## 2023-03-04 DIAGNOSIS — I63331 Cerebral infarction due to thrombosis of right posterior cerebral artery: Secondary | ICD-10-CM | POA: Diagnosis not present

## 2023-03-04 DIAGNOSIS — I10 Essential (primary) hypertension: Secondary | ICD-10-CM | POA: Diagnosis not present

## 2023-03-04 MED ORDER — AMLODIPINE BESYLATE 2.5 MG PO TABS
2.5000 mg | ORAL_TABLET | Freq: Every day | ORAL | Status: DC
Start: 2023-03-04 — End: 2023-03-08
  Administered 2023-03-04 – 2023-03-08 (×5): 2.5 mg via ORAL
  Filled 2023-03-04 (×5): qty 1

## 2023-03-04 NOTE — Patient Care Conference (Signed)
Inpatient RehabilitationTeam Conference and Plan of Care Update Date: 03/03/2023  Time: 10:39 AM    Patient Name: Kathleen Figueroa      Medical Record Number: [id number]  Date of Birth: 1936/01/12 Sex: Female         Room/Bed: 4W03C/4W03C-01 Payor Info: Payor: [employer] MEDICARE / Plan: [employer] MEDICARE HMO / Product Type: *No Product type* /    Admit Date/Time:  02/18/2023  4:29 PM  Primary Diagnosis:  <principal problem not specified>  Hospital Problems: Active Problems:   CVA (cerebral vascular accident) (HCC)   Stage 2 chronic kidney disease   Diabetes mellitus (HCC)   Primary hypertension    Expected Discharge Date: Expected Discharge Date:  (snf pendinh)  Team Members Present: Physician leading conference: Dr. Claudette Laws Social Worker Present: Dossie Der, LCSW Nurse Present: Chana Bode, RN;Lyzette Reinhardt Marjo Bicker, RN;Angelina Shirlee Latch, RN PT Present: Casimiro Needle, PT OT Present: Mariann Barter, OT SLP Present: Pablo Lawrence, SLP PPS Coordinator present : Fae Pippin, SLP     Current Status/Progress Goal Weekly Team Focus  Bowel/Bladder   incon't times 2   maintain clean dry and intact skin    Time toileting    Swallow/Nutrition/ Hydration   D2/ thin   supervision  inconsistent coughing during meals and liquids, use of strategies, plan for MBSS    ADL's   mod A UB dressing (with max verbal cues), max LB dressing, max A toileting, min A UB bathing (max verbal cues), max A LB bathing, skilled min-mod A squat pivot toielt transfers; Barriers- L hemibody attention/sensation/proprioception, midline orientation, trunk control; Pt with improved attention and response to verbal cues   min A overall BADLs   L side attention, BADL retraining, L hemi-body attention/awarenss, dynamic sitting balance, functional transfer trianing, sit<>stands, functional transfer training    Mobility   min A supine<>sit using bed features, skilled min A squat pivot transfers with mod/max  step-by-step cuing for sequencing and positioning, min A sit<>stands using R UE support on stable surface (hallway rail or steady bar) but up to heavy mod A for sit<>stands without that support, continuing gait training at R hallway rail with skilled mod A for L LE management and balance with +2 w/c follow; will need to further address wheelchair needs with tentative w/c eval scheduled on Tue 1/28 -- continues to have greatest deficits of severe L hemibody sensory deficits, visual acuity, perceptual, and motor deficits, L inattention, and overall impaired awareness of deficits and impact on her independence with mobility   CGA transfers, min A gait, and supervision w/c mobility -- may need to downgrade based on pt's progress  pt awarenss, L attention, transfer training, wheelchair management, L hemibody NMR, gait training, motor planning and sequencing -- pt will likely require custom manual wheelchair -- family will need a few days of hands-on training prior to D/C    Communication                Safety/Cognition/ Behavioral Observations  min to mod A   supervision   attention to L side, memory strategies, safety awareness and problem solving    Pain   Pain is controlled    Less than 3 with PRN medication use.    Assess every 4 hours and PRN    Skin   skin intact   Skin to remain intact and free of infection/breakdown    Assess skin qshift/prn and provide education to prevent skin breakdown     Discharge Planning:  Family looking into ALF versus  SNF, with her levels she will need SNF and will need them to begin looking at faciliteis and working on this. Son working on [employer] also   Team Discussion: Right PCA/CVA. 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 Time toileting with limited awareness of incontinence. Family to provide placement options for SNF. Requires max cues to perform BADLs. Barriers- L hemibody attention/sensation/proprioception, midline  orientation, trunk control; Pt with improved attention and response to verbal cues. No functional gait at this time. Tolerating D2 diet, focusing on swallowing strategies. Continue to work on attention to left side, memory strategies, safety awareness and problem solving.  Patient on target to meet rehab goals: no, some goals will need to be downgraded with placement pending.   *See Care Plan and progress notes for long and short-term goals.   Revisions to Treatment Plan:  Watching blood pressures.  Wheelchair consult next Tuesday at 11AM if not going SNF. Schedule MBS. Monitor labs and VS. Teaching Needs: Medications, safety, self care, toileting, transfers, diet modifications, etc.   Current Barriers to Discharge: Incontinence and Lack of/limited family support  Possible Resolutions to Barriers: Family education VS Facility bed placement. Order recommended DME if needed.      Medical Summary Current Status: severe left neglect, sensory and cognitive defiits S/P R PCA infarct , labile HTN  Barriers to Discharge: Uncontrolled Hypertension   Possible Resolutions to Barriers/Weekly Focus: BP med adjustment , pursue NHP   Continued Need for Acute Rehabilitation Level of Care: The patient requires daily medical management by a physician with specialized training in physical medicine and rehabilitation for the following reasons: Direction of a multidisciplinary physical rehabilitation program to maximize functional independence : Yes Medical management of patient stability for increased activity during participation in an intensive rehabilitation regime.: Yes Analysis of laboratory values and/or radiology reports with any subsequent need for medication adjustment and/or medical intervention. : Yes   I attest that I was present, lead the team conference, and concur with the assessment and plan of the team.   Jearld Adjutant 03/04/2023, 11:52 AM

## 2023-03-04 NOTE — Plan of Care (Signed)
  Problem: Consults Goal: RH STROKE PATIENT EDUCATION Description: See Patient Education module for education specifics  Outcome: Progressing   Problem: RH BOWEL ELIMINATION Goal: RH STG MANAGE BOWEL WITH ASSISTANCE Description: STG Manage Bowel with MOD I Assistance. Outcome: Progressing Goal: RH STG MANAGE BOWEL W/MEDICATION W/ASSISTANCE Description: STG Manage Bowel with Medication with MOD I Assistance. Outcome: Progressing   Problem: RH BLADDER ELIMINATION Goal: RH STG MANAGE BLADDER WITH ASSISTANCE Description: STG Manage Bladder With toileting Assistance Outcome: Progressing   Problem: RH SAFETY Goal: RH STG ADHERE TO SAFETY PRECAUTIONS W/ASSISTANCE/DEVICE Description: STG Adhere to Safety Precautions With Cues  Outcome: Progressing   Problem: RH KNOWLEDGE DEFICIT Goal: RH STG INCREASE KNOWLEDGE OF DIABETES Description: Patient and daughter will be able to manage independently with educational resources provided Outcome: Progressing Goal: RH STG INCREASE KNOWLEDGE OF HYPERTENSION Outcome: Progressing Goal: RH STG INCREASE KNOWLEGDE OF HYPERLIPIDEMIA Description: Patient and daughter will be able to manage independently with educational resources provided Outcome: Progressing Goal: RH STG INCREASE KNOWLEDGE OF STROKE PROPHYLAXIS Description: Patient and daughter will be able to manage independently with educational resources provided Outcome: Progressing   Problem: RH Vision Goal: RH LTG Vision (Specify) Outcome: Progressing

## 2023-03-04 NOTE — Progress Notes (Signed)
Occupational Therapy Session Note  Patient Details  Name: Kathleen Figueroa MRN: [id number] Date of Birth: Aug 20, 1935  Today's Date: 03/04/2023 OT Individual Time: 0806-0900 OT Individual Time Calculation (min): 54 min    Short Term Goals: Week 2:  OT Short Term Goal 1 (Week 2): Pt will locate 3/3 items in L visual field with min verbal cues. OT Short Term Goal 2 (Week 2): Pt will recall hemi-dressing techniques with min questioning cues. OT Short Term Goal 3 (Week 2): Pt will integrate LUE into functional tasks with Mod multimodal cuing. OT Short Term Goal 4 (Week 2): Pt will identify at least two visual compensatory techniques with min questioning cues.  Skilled Therapeutic Interventions/Progress Updates:     Pt received sitting up in bed eating breakfast under supervision of nursing staff upon OT arrival. Pt presenting to be in good spirits receptive to skilled OT session reporting 0/10 pain- OT offering intermittent rest breaks, repositioning, and therapeutic support to optimize participation in therapy session. Focus this session L side attention, visual scanning, self-feeding, and BADL retraining. Pt handed off to OT from nursing staff.   Spent time at beginning of session working on self-feeding goals to increase Pt's independence and decrease overall bourdon of care. Positioned Pt's meal tray just past midline to the R to allow Pt to see majority of plate and positioned drink cups to Pt's L to facilitate visual scanning to L. Educated Pt on using eyes to compensate for decreased attention to L and visually scanning entire tray to locate items prior to trying to reach for items as she may knock over cups or food. Pt able to self feed ~75% of meal with supervision with intermittent min A required to scoop finely chopped food or retrieve drink cups.   Pt transitioned to EOB with HOB elevated using bed rail with CGA +increased time and mod verbal cues or sequencing. Pt completed U/LB dressing  sitting EOB with mod verbal cues required to recall hemi-dressing technique. D/t visual and proprioceptive deficits, educated Pt on utilizing eyes to accommodate for deficits. Utilized reverse training to support learning U/LB dressing techniques. Pt required assistance to weave L UE and bring shirt fully over shoulder and for orienting shirt when bringing it over her head and lastly weaving R UE. Worked on crossing L LE over R to weave foot into pants with assistance required to maintain dynamic sitting balance in this position. Pt able to weave R LE and stood with min A while holding onto bed rail and mod A required to maintain balance while OT brought pants to waist.   Pt then urgently requesting to use restroom. Utilized stedy for transport to BR d/t urgency. Min A required for sit<>stands during toileting and Pt able to maintain standing balance and perched sitting balance balance CGA-min A this session with B UEs supported on stedy grab bar. 3/3 toileting tasks completed max A with Pt assisting with donning/doffing pants using R UE. Pt with incontinent void during transport-documented in flowsheets.   Pt presenting with increased awareness to L hemibody and improved sitting balance this session. Pt also able to verbalize need to correct body alignment sitting EOB and notice when leaning to the R in sitting. Pt was left resting in TISWC with call bell in reach, seatbelt alarm on, and all needs met.    Therapy Documentation Precautions:  Precautions Precautions: Fall, Other (comment) Precaution Comments: L hemiparesis, L visual field cut, L inattention, L hemisensory loss Restrictions Weight Bearing Restrictions Per Provider  Order: No  Therapy/Group: Individual Therapy  Clide Deutscher 03/04/2023, 7:57 AM

## 2023-03-04 NOTE — Progress Notes (Signed)
PROGRESS NOTE   Subjective/Complaints:    ROS-denies chest pain or shortness of breath.  Denies abdominal pain, n/v/d.  poor sensation left side , L neglect, - pain c/os, no breathing issues   Objective:   No results found.  No results for input(s): "WBC", "HGB", "HCT", "PLT" in the last 72 hours.   No results for input(s): "NA", "K", "CL", "CO2", "GLUCOSE", "BUN", "CREATININE", "CALCIUM" in the last 72 hours.    Intake/Output Summary (Last 24 hours) at 03/04/2023 0835 Last data filed at 03/04/2023 0835 Gross per 24 hour  Intake 714 ml  Output --  Net 714 ml        Physical Exam: Vital Signs Blood pressure (!) 148/70, pulse 73, temperature (!) 97.5 F (36.4 C), resp. rate 18, height 5\' 6"  (1.676 m), weight 63.2 kg, SpO2 100%.  General: No acute distress Mood and affect are appropriate Heart: Regular rate and rhythm no rubs murmurs or extra sounds Lungs: Clear to auscultation, breathing unlabored, no rales or wheezes Abdomen: Positive bowel sounds, soft nontender to palpation, nondistended Extremities: No clubbing, cyanosis, or edema Skin: No evidence of breakdown, no evidence of rash Speech with moderate dysarthria  Musculoskeletal: limited Left knee flexion to 90 degrees . 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 No joint swelling No significant left arm tenderness or pain with PROM noted  motor strength is 5/5 in right deltoid, bicep, tricep, grip, hip flexor, knee extensors, ankle dorsiflexor and plantar flexor Left side difficult to examine due to severe sensory ataxia but at least 4/5 Wandering LUE movements  Sensory exam absent sensation to light touch and proprioception in LEFT upper and lower extremities     Assessment/Plan: 1.  Functional deficits which require 3+ hours per day of interdisciplinary therapy in a comprehensive inpatient rehab setting. Physiatrist is providing close team supervision and 24 hour management of active medical problems listed below. Physiatrist and rehab team continue to assess barriers to discharge/monitor patient progress toward functional and medical goals  Care Tool:  Bathing    Body parts bathed by patient: Left arm, Chest, Abdomen, Front perineal area, Right upper leg, Left upper leg, Face   Body parts bathed by helper: Right arm, Buttocks, Left lower leg, Right lower leg     Bathing assist Assist Level: Moderate Assistance - Patient 50 - 74%     Upper Body Dressing/Undressing Upper body dressing   What is the patient wearing?: Pull over shirt    Upper body assist Assist Level: Moderate Assistance - Patient 50 - 74%    Lower Body Dressing/Undressing Lower body dressing      What is the patient wearing?: Pants, Underwear/pull up     Lower body assist Assist for lower body dressing: Maximal Assistance - Patient 25 - 49%     Toileting Toileting    Toileting assist Assist for toileting: Total Assistance - Patient < 25%     Transfers Chair/bed transfer  Transfers assist  Chair/bed transfer activity did not occur: Safety/medical concerns (unsafe to get up)  Chair/bed transfer assist level: Minimal Assistance - Patient > 75% (squat pivot)     Locomotion Ambulation   Ambulation assist   Ambulation activity did not occur: Safety/medical concerns (requires skilled assistance)  Assist level: 2 helpers Assistive device: Other (comment) (R hallway rail) Max distance: 78ft   Walk 10 feet activity   Assist  Walk 10 feet activity did not occur: Safety/medical concerns  Assist level: 2 helpers Assistive device: Other (comment) (hand rail.)   Walk 50 feet activity   Assist Walk 50 feet with 2 turns activity did not occur: Safety/medical concerns          Walk 150 feet activity   Assist Walk 150 feet activity did not occur: Safety/medical concerns         Walk 10 feet on uneven surface  activity   Assist Walk 10 feet on uneven surfaces activity did not occur: Safety/medical concerns         Wheelchair     Assist Is the patient using a wheelchair?: Yes Type of Wheelchair: Manual    Wheelchair assist level: Total Assistance - Patient < 25% Max wheelchair distance: 62ft    Wheelchair 50 feet with 2 turns activity    Assist        Assist Level: Dependent - Patient 0%   Wheelchair 150 feet activity     Assist      Assist Level: Dependent - Patient 0%   Blood pressure (!) 148/70, pulse 73, temperature (!) 97.5 F (36.4 C), resp. rate 18, height 5\' 6"  (1.676 m), weight 63.2 kg, SpO2 100%.  Medical Problem List and Plan: 1. 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  Antithrombotics: -DVT/anticoagulation:  Pharmaceutical: lovenox 40mg  qd -antiplatelet therapy: Aspirin and Brilinta for three 90 days followed by aspirin alone   -hx left CEA 3. Pain Management: Tylenol as needed   -1/17 gabapentin 100 mg 3 times daily for neuropathic pain-- helping Severe left knee OA with contracture , pain ok unless knee is flexed to >90deg  4. Mood/Behavior/Sleep: LCSW to evaluate and provide emotional support             -antipsychotic agents: n/a              - Melatonin PRN   5. Neuropsych/cognition: This patient is not quite capable of making decisions on her  own behalf.   6. Skin/Wound Care: Routine skin care checks   7. Fluids/Electrolytes/Nutrition: Routine Is and Os and follow-up chemistries   8: Hypertension: monitor TID and prn (Diovan 320 mg daily at home) On avapro 150mg  will increase to 300mg   1/17 fair control continue current regimen and monitor Restart amlodipine , low dose  Vitals:   02/28/23 2119 03/01/23 0307 03/01/23 1600 03/01/23 1948  BP: (!) 147/74 (!) 142/73 130/72 (!) 154/80   03/02/23 0323 03/02/23 1243 03/02/23 1933 03/03/23 0524  BP: 128/69 111/68 (!) 181/83 (!) 192/90  03/03/23 0913 03/03/23 1245 03/03/23 2012 03/04/23 0250  BP: (!) 148/90 (!) 147/72 137/87 (!) 148/70    9: Hyperlipidemia: continue atorvastatin 80mg  daily   10: DM-2: A1c = 6.4% on October, 2024 (no home meds)  -1/16 glucose stable 127 on BMP 1/13  11: Elevated serum creatinine/GFR ~55: ? Mild CKD; near baseline ~1.0             -Monitor on increased ARB , recheck 1/20 -Continue to encourage and monitor fluid intake       Latest Ref Rng & Units 03/01/2023    5:08 AM 02/22/2023    5:00 AM 02/19/2023    9:41 AM  BMP  Glucose 70 - 99 mg/dL 161  096  045   BUN 8 - 23 mg/dL 26  22  13    Creatinine 0.44 - 1.00 mg/dL 4.09  8.11  9.14   Sodium 135 - 145 mmol/L 136  137  138   Potassium 3.5 - 5.1 mmol/L 4.3  3.8  3.8   Chloride 98 - 111 mmol/L 105  102  100   CO2 22 - 32 mmol/L 22  27  27    Calcium 8.9 - 10.3 mg/dL 8.9  8.8  8.9      12. Eye redness improved   -Artificial tears as needed ordered, continue to monitor  13. Constipation -1/17 appears she had a few bowel movements yesterday however she still reported feeling constipated.  It does appear that she had not had a bowel movement for a few days prior to this so may still still have some stool in her colon.  Will schedule MiraLAX, add sorbitol as needed -02/27/23 although reporting no BM in 2 days, she had 3 yesterday-- leave meds for now, but if continues to have frequent loose stools,  may need to back off -02/28/23 no BM since 1/17, monitor closely for BMs today, adjust meds as needed 1/20 last BM 1/18 will need to increase miralax to BID 1/21 cont BM at 2309- no BM on 1/22 LOS: 14 days A FACE TO FACE EVALUATION WAS PERFORMED  Erick Colace 03/04/2023, 8:35 AM

## 2023-03-04 NOTE — Progress Notes (Signed)
Physical Therapy Weekly Progress Note  Patient Details  Name: Kathleen Figueroa MRN: [id number] Date of Birth: 07-Jul-1935  Beginning of progress report period: February 26, 2023 End of progress report period: March 04, 2023  Today's Date: 03/04/2023 PT Individual Time: 8295-6213 and 1406-1500 PT Individual Time Calculation (min): 43 min and 54 min   Patient has met 4 of 4 short term goals. Ms. Bailly continues to make steady progress with therapy; however, continues to have greatest deficits in severe L hemi-sensory deficits, L hemiparesis, L inattention, and visual motor, visual perceptual, and visual acuity deficits all impacting her functional mobility. She is performing supine<>sit using bed features with CGA/min A, maintains sitting balance EOB with SBA to min A, squat pivot transfers with min A, sit<>stand transfers with heavy mod A when not using R UE support on stable surface. She has been participating in gait training at R hallway rail with skilled mod A for balance and L LE management while +2 brings w/c follow for safety. She will likely D/C home at a wheelchair level and the wheelchair consultation has been scheduled for Tuesday 1/28 at 11:00AM. She will benefit from continued CIR level skilled physical therapy prior to D/Cing home with 24hr support vs D/Cing to SNF for continued daily therapy.   Patient continues to demonstrate the following deficits muscle weakness and muscle joint tightness, decreased cardiorespiratoy endurance, impaired timing and sequencing, unbalanced muscle activation, motor apraxia, decreased coordination, and decreased motor planning, decreased visual acuity, decreased visual perceptual skills, and decreased visual motor skills, decreased midline orientation and decreased attention to left, decreased initiation, decreased attention, decreased awareness, decreased problem solving, decreased safety awareness, and delayed processing, and decreased sitting balance,  decreased standing balance, decreased postural control, and decreased balance strategies and therefore will continue to benefit from skilled PT intervention to increase functional independence with mobility.  Patient progressing toward long term goals; however, based on severity of deficits may require downgrading of goals.  Continue plan of care.  PT Short Term Goals Week 2:  PT Short Term Goal 1 (Week 2): Pt will perform supine<>sit with min A PT Short Term Goal 1 - Progress (Week 2): Met PT Short Term Goal 2 (Week 2): Pt will perform sit<>stands using LRAD with min A consistently PT Short Term Goal 2 - Progress (Week 2): Met (using using stable surface for R UE support; otherwise, mod A) PT Short Term Goal 3 (Week 2): Pt will perform bed<>chair transfers using LRAD with min A PT Short Term Goal 3 - Progress (Week 2): Met (squat pivot with verbal cuing) PT Short Term Goal 4 (Week 2): Pt will ambulate at least 64ft using LRAD with mod A of 1 and +2 assist as needed PT Short Term Goal 4 - Progress (Week 2): Met Week 3:  PT Short Term Goal 1 (Week 3): Pt will perform supine<>sit using hospital bed features as needed with CGA PT Short Term Goal 2 (Week 3): Pt will perform bed<>chair transfers with CGA and mod verbal cuing PT Short Term Goal 3 (Week 3): Pt will perform sit<>stands using LRAD (not hallway rail) with min A PT Short Term Goal 4 (Week 3): Pt will perform w/c propulsion at least 92ft with min A PT Short Term Goal 5 (Week 3): Pt will continue participating in gait training using LRAD with mod A and +2 assist as needed  Skilled Therapeutic Interventions/Progress Updates:  Ambulation/gait training;Community reintegration;DME/adaptive equipment instruction;Neuromuscular re-education;Psychosocial support;Stair training;UE/LE Strength taining/ROM;Wheelchair propulsion/positioning;Balance/vestibular training;Discharge planning;Functional electrical stimulation;Pain management;Skin  care/wound  management;Therapeutic Activities;UE/LE Coordination activities;Disease management/prevention;Cognitive remediation/compensation;Functional mobility training;Patient/family education;Splinting/orthotics;Therapeutic Exercise;Visual/perceptual remediation/compensation   Session 1: Pt received sitting in TIS w/c awake and agreeable to therapy session reporting she is feeling a 5/10 today in general. Pt noticed to not have dentures in this morning and requesting to put them in.   Engaged pt in increasing independence with this task and L UE NMR to locate things with her L hand - placed pink container with dentures in sink and had pt visually locate it and her L hand to try and grasp the container with her L hand - pt with some proprioceptive awareness of L hand (it fell from the sink into her lap and without seeing it she could feel this) - pt requires >62minutes to try and locate the pink container with her L hand and eventually requires min hand-over-hand facilitation to locate it. Pt able to brush remaining bottom teeth with set-up assist and verbal cuing using her R hand. Therapist provides dependent assist to clean pt's dentures and then pt able to problem solve how to place them in her mouth correctly without cuing.  Performed seated in TIS w/c L visual scanning and L UE NMR task to locate items on her tray table - pt continues to have severe deficits when trying to use her L UE to complete a functional task, but is improving in ability to visually scan L of midline to locate items visually.   At end of session, pt left seated in TIS w/c with needs in reach, seat belt, and seat belt alarm on.    Session 2: Pt received sitting in TIS w/c awake and eager to participate in therapy session. Pt continues to have R gaze preference requiring cuing for L visual scanning; however, is improving in being able to identify items in L visual field.  Transported to/from gym in w/c for time management and energy  conservation.  Sit>stand TIS w/c>R UE support on litegait handle with min A for balance while rising into standing has persistent R weight shift bias and relying heavily on RUE support for balance - pt improving in recalling the sequence and set-up of sit>stand transfer. Static standing with light min A while donning litegait harness with +2 assist.  Gait training 93ft + 194ft using litegait harness providing mod/max partial BWS with skilled mod A for L LE management and +2 managing litegait. Pt demonstrating the following gait deviations with therapist providing the described cuing and facilitation for improvement:  - uses R UE support on litegait handle throughout - starts with requiring step-by-step cuing for sequencing R and L stepping; however, is able to recall the sequencing and carry it over without cuing  - requires manual facilitation for L LE positioning to avoid scissoring during swing; however, gait mechanics have improved significantly with pt having more normal BOS, increased L hip/knee flexion during swing; however, still lands with toes on initial contact; and more consistently achieves reciprocal stepping pattern  --  will still occasionally have excessive L LE hip external rotation and a few times will progressively have too wide of a BOS when attempting to cue for more normal BOS  - therapist facilitating R/L weight shifting onto stance limbs (primarily R wt shift) - guarding L knee during stance phase but no true buckling  Pt noted to be incontinent of bladder.   Transported back to room.   Sit>stand TIS w/c>R UE support on sink with min A for lifting/balance - pt relies heavily on  R UE support and hip support on sink to maintain balance with CGA and +2 assist present for safety during dependent LB clothing management and peri-care.   Transported back to dayroom for pt to participate in dance group - left seated tilted back slightly in TIS w/c with seat belt on and therapy tech  present to assist patient.   Therapy Documentation Precautions:  Precautions Precautions: Fall, Other (comment) Precaution Comments: L hemiparesis, L visual field cut, L inattention, L hemisensory loss Restrictions Weight Bearing Restrictions Per Provider Order: No   Pain:  Session 1: Reports L shoulder pain when raising her arm performing shoulder flexion or abduction - therapist provides support for L arm at the elbow when raising to manage pain.  Session 2: Continues to have L knee pain and thigh discomfort when excessively flexing her knee - adjusted mobility and cuing for pain management.   Therapy/Group: Individual Therapy  [name] , PT, DPT, NCS, CSRS 03/04/2023, 7:48 AM

## 2023-03-04 NOTE — Progress Notes (Signed)
Occupational Therapy Session Note  Patient Details  Name: Kathleen Figueroa MRN: [id number] Date of Birth: [birth year]  Today's Date: 03/04/2023 OT Individual Time: 4132-4401 OT Individual Time Calculation (min): 45 min    Short Term Goals: Week 2:  OT Short Term Goal 1 (Week 2): Pt will locate 3/3 items in L visual field with min verbal cues. OT Short Term Goal 2 (Week 2): Pt will recall hemi-dressing techniques with min questioning cues. OT Short Term Goal 3 (Week 2): Pt will integrate LUE into functional tasks with Mod multimodal cuing. OT Short Term Goal 4 (Week 2): Pt will identify at least two visual compensatory techniques with min questioning cues.  Skilled Therapeutic Interventions/Progress Updates:  Pt greeted seated in w/c, pt agreeable to OT intervention.      Transfers/bed mobility/functional mobility: pt completed sit>stands in stedy for toileting tasks with MIN A +2, total A to position LUE on rail of stedy.    ADLs:  LB dressing: total A to don fresh brief and manipulate pants after incontinent toileting tasks.   Transfers: MIN A +2 for sit>stand transfer in stedy  Toileting: pt required total A for 3/3 toileting tasks after incontinent urine void.  Self feeding:    NMR: pt completed various reaching tasks with LUE to facilitate improved L sided attention and visual scanning.  - pt instructed to slide block towards target with BUEs to provide NMR to LUE and improve bimanual integration. Pt required MAX multimodal cues to locate cone at midline. Pt completed x10 reps  -pt instructed to locate cones on R side of table and track to L to place cones on L side of table on blue line with an emphasis on scanning to L side while providing NMR to LUE in weightbearing position.   Ended session with pt seated in w/c with all needs within reach and safety belt alarm activated as well as seat belt.                Therapy Documentation Precautions:  Precautions Precautions: Fall,  Other (comment) Precaution Comments: L hemiparesis, L visual field cut, L inattention, L hemisensory loss Restrictions Weight Bearing Restrictions Per Provider Order: No  Pain: No pain reported during session    Therapy/Group: Individual Therapy  Barron Schmid 03/04/2023, 12:19 PM

## 2023-03-04 NOTE — Progress Notes (Signed)
Patient ID: Kathleen Figueroa, female   DOB: April 27, 1935, 88 y.o.   MRN: [id number] Left message for [name] regarding team conference update with slow gains in therapies and plan for SNF. Family will ned to talk with their Mom to discuss plan unless they want worker to talk to her about the plan.

## 2023-03-05 NOTE — Progress Notes (Signed)
Physical Therapy Session Note  Patient Details  Name: Kathleen Figueroa MRN: [id number] Date of Birth: 02/03/1936  Today's Date: 03/05/2023 PT Individual Time: 0808-0919 PT Individual Time Calculation (min): 71 min   Short Term Goals: Week 2:  PT Short Term Goal 1 (Week 2): Pt will perform supine<>sit with min A PT Short Term Goal 1 - Progress (Week 2): Met PT Short Term Goal 2 (Week 2): Pt will perform sit<>stands using LRAD with min A consistently PT Short Term Goal 2 - Progress (Week 2): Met (using using stable surface for R UE support; otherwise, mod A) PT Short Term Goal 3 (Week 2): Pt will perform bed<>chair transfers using LRAD with min A PT Short Term Goal 3 - Progress (Week 2): Met (squat pivot with verbal cuing) PT Short Term Goal 4 (Week 2): Pt will ambulate at least 43ft using LRAD with mod A of 1 and +2 assist as needed PT Short Term Goal 4 - Progress (Week 2): Met Week 3:  PT Short Term Goal 1 (Week 3): Pt will perform supine<>sit using hospital bed features as needed with CGA PT Short Term Goal 2 (Week 3): Pt will perform bed<>chair transfers with CGA and mod verbal cuing PT Short Term Goal 3 (Week 3): Pt will perform sit<>stands using LRAD (not hallway rail) with min A PT Short Term Goal 4 (Week 3): Pt will perform w/c propulsion at least 26ft with min A PT Short Term Goal 5 (Week 3): Pt will continue participating in gait training using LRAD with mod A and +2 assist as needed  Skilled Therapeutic Interventions/Progress Updates:  Patient supine in bed on entrance to room. Patient alert and agreeable to PT session. However, she relates that she is confused. Informed pt again, of therapist's name and reason for being in pt's room. Informed pt of all intended actions and allowed pt to participate in all decision making this session.   Patient with no pain complaint at start of session.  Therapeutic Activity/ NMR: Pt decides to toilet and relates that she is performing "timed  toileting" but confused on this. Informed pt that staff will assist her to toilet every 3 hrs in order to retrain bladder. Prior to transfer to toilet, pt allowed to choose outfit for day when provided with options.  Bed Mobility: Pt performed supine > sit initially attempting to move RLE over LLE but gets stuck. Reminded pt with vc/ tc that she will need to move LLE first. Cued to look at LLEthen is able to bring LLE OOB and then follow with RLE. With cues, she is able to reach seated position on EOB with supervision.  While seated on toilet, pt guided in pericare/ cleaning using safety rail for balance and provided with washcloths to clean, pt is able to maintain stance with CGA/ intemittent MinA throughout cleaning front and back sides. Pt also cleans L armpit with supervision and R armpit with MaxA. Provided with vc/ tc throughout for assist in coordinated movement. Then guided in dressing and requires overall Mod/ MaxA for required movements using LUE to complete UB and LB dressing. Is able to follow cues to use R hemibody with supervision and L hemibody requires from Min to MaxA and max cues to complete. Several attempts required for LUE>LLE.  Transfers: Pt performed sit<>stand initially using STEDY for time and safety with one person assist and pt's increased confusion. Performs from low seats with MinA and from increased seat heights with CGA/ supervision. Required vc for steps to  open palm, look at L hand and then place hand onpull bar to grasp. Sit<>stand performed at guard rail in hallway with light MinA/ CGA.   Gait Training/ NMR:  Pt attempted ambulation using R hallway HR and is able to reach ~39ft and then 77ft but demos increased BLE knee flexion with difficulty reaching upright posturing. Requires vc for each LE and guard of LLE to prevent adduction. During stance phases bilaterally, pt demos increased knee flexion and difficulty maintaining extension. Pt ends first 6 foot bout with Bil knee  buckling and hard sit to w/c. Attempted ambulation again but pt continues to have difficulty with advancing LE and bout ended for safety.   Standing activity with weight shifting to LLE with pressure applied longitudinally through knee and into foot in attempt for loading joint approximation and improving sensation. Performed x10 with holds from10sec to 30sec each.   NMR performed for improvements in motor control and coordination, balance, sequencing, judgement, and self confidence/ efficacy in performing all aspects of mobility at highest level of independence.   Patient seated upright in TIS w/c at end of session with brakes locked, seat belt and  belt alarm set, and all needs within reach.   Therapy Documentation Precautions:  Precautions Precautions: Fall, Other (comment) Precaution Comments: L hemiparesis, L visual field cut, L inattention, L hemisensory loss Restrictions Weight Bearing Restrictions Per Provider Order: No  Pain:  Continued pain in L thigh. Pt related to MD. Addressed during session with repositioning.     Therapy/Group: Individual Therapy  Loel Dubonnet PT, DPT, CSRS 03/05/2023, 9:14 AM

## 2023-03-05 NOTE — Progress Notes (Signed)
Patient ID: Kathleen Figueroa, female   DOB: 06/04/1935, 88 y.o.   MRN: [id number] Left message for Sherrie-daughter regarding team conference update progress this week in therapies and still will require 24/7 physical care and best option is going to a SNF then transition to ALF if able too. Made aware will not go forward with a wheelchair eval due to facility is responsible for providing any equipment needed. She could get one is still needed when leaves the SNF. So to let her brother know the wheelchair eval will be not occur next week. Await return call from daughter if questions and facility choices

## 2023-03-05 NOTE — Plan of Care (Signed)
  Problem: RH BLADDER ELIMINATION Goal: RH STG MANAGE BLADDER WITH ASSISTANCE Description: STG Manage Bladder With toileting Assistance Outcome: Progressing   Problem: RH SAFETY Goal: RH STG ADHERE TO SAFETY PRECAUTIONS W/ASSISTANCE/DEVICE Description: STG Adhere to Safety Precautions With Cues  Outcome: Progressing   Problem: RH KNOWLEDGE DEFICIT Goal: RH STG INCREASE KNOWLEDGE OF HYPERTENSION Outcome: Progressing Goal: RH STG INCREASE KNOWLEDGE OF STROKE PROPHYLAXIS Description: Patient and daughter will be able to manage independently with educational resources provided Outcome: Progressing

## 2023-03-05 NOTE — Progress Notes (Signed)
PROGRESS NOTE   Subjective/Complaints:  TIghtness in Left upper arm and left thigh , no new trauma, not impacting sleep  Discussed shoulder pain post CVA as well as sensory deficit   ROS-denies chest pain or shortness of breath.  Denies abdominal pain, n/v/d.  poor sensation left side , L neglect, - no breathing issues   Objective:   No results found.  No results for input(s): "WBC", "HGB", "HCT", "PLT" in the last 72 hours.   No results for input(s): "NA", "K", "CL", "CO2", "GLUCOSE", "BUN", "CREATININE", "CALCIUM" in the last 72 hours.    Intake/Output Summary (Last 24 hours) at 03/05/2023 0838 Last data filed at 03/04/2023 1236 Gross per 24 hour  Intake 120 ml  Output --  Net 120 ml        Physical Exam: Vital Signs Blood pressure 135/79, pulse 84, temperature 98.4 F (36.9 C), resp. rate 16, height 5\' 6"  (1.676 m), weight 64.2 kg, SpO2 98%.  General: No acute distress Mood and affect are appropriate Heart: Regular rate and rhythm no rubs murmurs or extra sounds Lungs: Clear to auscultation, breathing unlabored, no rales or wheezes Abdomen: Positive bowel sounds, soft nontender to palpation, nondistended Extremities: No clubbing, cyanosis, or edema Skin: No evidence of breakdown, no evidence of rash Speech with moderate dysarthria  Musculoskeletal: limited Left knee flexion to 90 degrees . 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  Left hemineglect Absent sensation to light touch left upper and lower extremities Musculoskeletal: left knee does not flex beyond 90 deg, has pain at end range  No joint swelling No significant left arm tenderness or pain with PROM noted, neg impingement signs No edema   motor strength is 5/5 in right deltoid, bicep, tricep, grip, hip flexor, knee extensors, ankle dorsiflexor and plantar flexor Left side difficult to examine due to severe sensory ataxia but  at least 4/5 Wandering LUE movements  Sensory exam absent sensation to light touch and proprioception in LEFT upper and lower extremities     Assessment/Plan: 1. Functional deficits which require 3+ hours per day of interdisciplinary therapy in a comprehensive inpatient rehab setting. Physiatrist is providing close team supervision and 24 hour management of active medical problems listed below. Physiatrist and rehab team continue to assess barriers to discharge/monitor patient progress toward functional and medical goals  Care Tool:  Bathing    Body parts bathed by patient: Left arm, Chest, Abdomen, Front perineal area, Right upper leg, Left upper leg, Face   Body parts bathed by helper: Right arm, Buttocks, Left lower leg, Right lower leg     Bathing assist Assist Level: Moderate Assistance - Patient 50 - 74%     Upper Body Dressing/Undressing Upper body dressing   What is the patient wearing?: Pull over shirt    Upper body assist Assist Level: Moderate Assistance - Patient 50 - 74%    Lower Body Dressing/Undressing Lower body dressing      What is the patient wearing?: Pants, Underwear/pull up     Lower body assist Assist for lower body dressing: Maximal Assistance - Patient 25 - 49%     Toileting Toileting    Toileting  assist Assist for toileting: Total Assistance - Patient < 25%     Transfers Chair/bed transfer  Transfers assist  Chair/bed transfer activity did not occur: Safety/medical concerns (unsafe to get up)  Chair/bed transfer assist level: Minimal Assistance - Patient > 75% (squat pivot)     Locomotion Ambulation   Ambulation assist   Ambulation activity did not occur: Safety/medical concerns (requires skilled assistance)  Assist level: 2 helpers Assistive device: Other (comment) (R hallway rail) Max distance: 24ft   Walk 10 feet activity   Assist  Walk 10 feet activity did not occur: Safety/medical concerns  Assist level: 2  helpers Assistive device: Other (comment) (hand rail.)   Walk 50 feet activity   Assist Walk 50 feet with 2 turns activity did not occur: Safety/medical concerns         Walk 150 feet activity   Assist Walk 150 feet activity did not occur: Safety/medical concerns         Walk 10 feet on uneven surface  activity   Assist Walk 10 feet on uneven surfaces activity did not occur: Safety/medical concerns         Wheelchair     Assist Is the patient using a wheelchair?: Yes Type of Wheelchair: Manual    Wheelchair assist level: Total Assistance - Patient < 25% Max wheelchair distance: 27ft    Wheelchair 50 feet with 2 turns activity    Assist        Assist Level: Dependent - Patient 0%   Wheelchair 150 feet activity     Assist      Assist Level: Dependent - Patient 0%   Blood pressure 135/79, pulse 84, temperature 98.4 F (36.9 C), resp. rate 16, height 5\' 6"  (1.676 m), weight 64.2 kg, SpO2 98%.  Medical Problem List and Plan: 1. 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  Antithrombotics: -DVT/anticoagulation:  Pharmaceutical: lovenox 40mg  qd -antiplatelet therapy: Aspirin and Brilinta for three 90 days followed by aspirin alone   -hx left CEA 3. Pain Management: Tylenol as needed   -1/17 gabapentin 100 mg 3 times daily for neuropathic pain-- helping Severe left knee OA with contracture , pain ok unless knee is flexed to >90deg  4. Mood/Behavior/Sleep: LCSW to evaluate and provide emotional  support             -antipsychotic agents: n/a              - Melatonin PRN   5. Neuropsych/cognition: This patient is not quite capable of making decisions on her own behalf.   6. Skin/Wound Care: Routine skin care checks   7. Fluids/Electrolytes/Nutrition: Routine Is and Os and follow-up chemistries   8: Hypertension: monitor TID and prn (Diovan 320 mg daily at home) On avapro 150mg  will increase to 300mg   1/17 fair control continue current regimen and monitor Restart amlodipine , low dose 2.5 mg, improved 1/24 Vitals:   03/01/23 1948  03/02/23 0323 03/02/23 1243 03/02/23 1933  BP: (!) 154/80 128/69 111/68 (!) 181/83   03/03/23 0524 03/03/23 0913 03/03/23 1245 03/03/23 2012  BP: (!) 192/90 (!) 148/90 (!) 147/72 137/87   03/04/23 0250 03/04/23 1159 03/04/23 1307 03/04/23 2012  BP: (!) 148/70 106/66 124/76 135/79    9: Hyperlipidemia: continue atorvastatin 80mg  daily   10: DM-2: A1c = 6.4% on October, 2024 (no home meds)  -1/16 glucose stable 127 on BMP 1/13  11: Elevated serum creatinine/GFR ~55: ? Mild CKD; near baseline ~1.0             -Monitor on increased ARB , recheck 1/20 -Continue to encourage and monitor fluid intake       Latest Ref Rng & Units 03/01/2023    5:08 AM 02/22/2023    5:00 AM 02/19/2023    9:41 AM  BMP  Glucose 70 - 99 mg/dL 161  096  045   BUN 8 - 23 mg/dL 26  22  13    Creatinine 0.44 - 1.00 mg/dL 4.09  8.11  9.14   Sodium 135 - 145 mmol/L 136  137  138   Potassium 3.5 - 5.1 mmol/L 4.3  3.8  3.8   Chloride 98 - 111 mmol/L 105  102  100   CO2 22 - 32 mmol/L 22  27  27    Calcium 8.9 - 10.3 mg/dL 8.9  8.8  8.9      12. Eye redness improved   -Artificial tears as needed ordered, continue to monitor  13. Constipation -1/17 appears she had a few bowel movements yesterday however she still reported feeling constipated.  It does appear that she had not had a bowel movement for a few days prior to this so may still still have some stool in her colon.   Will schedule MiraLAX, add sorbitol as needed -02/27/23 although reporting no BM in 2 days, she had 3 yesterday-- leave meds for now, but if continues to have frequent loose stools, may need to back off -02/28/23 no BM since 1/17, monitor closely for BMs today, adjust meds as needed 1/20 last BM 1/18 will need to increase miralax to BID 1/21 cont BM at 2309- no BM on 1/22 LOS: 15 days A FACE TO FACE EVALUATION WAS PERFORMED  Erick Colace 03/05/2023, 8:38 AM

## 2023-03-05 NOTE — Progress Notes (Signed)
Occupational Therapy Session Note  Patient Details  Name: Kathleen Figueroa MRN: [id number] Date of Birth: 02-01-1936  Today's Date: 03/05/2023 OT Individual Time: 1003-1100 OT Individual Time Calculation (min): 57 min  OT Individual Time: 1303-1400 OT Individual Time Calculation (min): 57 min   Short Term Goals: Week 2:  OT Short Term Goal 1 (Week 2): Pt will locate 3/3 items in L visual field with min verbal cues. OT Short Term Goal 2 (Week 2): Pt will recall hemi-dressing techniques with min questioning cues. OT Short Term Goal 3 (Week 2): Pt will integrate LUE into functional tasks with Mod multimodal cuing. OT Short Term Goal 4 (Week 2): Pt will identify at least two visual compensatory techniques with min questioning cues.  Skilled Therapeutic Interventions/Progress Updates:     AM Session: Pt received sitting up in [facility], dressed for the day upon OT arrival. Pt presenting to be in good spirits receptive to skilled OT session reporting 0/10 pain- OT offering intermittent rest breaks, repositioning, and therapeutic support to optimize participation in therapy session. Focus this session on midline awareness, L side awareness, standing balance/tolerance, and L UE NMRE.  Transported Pt total A to therapy gym in [facility] for time management and energy conservation.   Donned bright yellow sleeve onto Pt's L U/LEs to bring attention to Pt's L hemibody. Educated Pt on using yellow sleeves as visual anchors to locate L hemibody and increase attention to their location in space d/t Pt's significant proprioceptive and sensory deficits. Pt also named L UE "sunshine" to help recall need to focus on L UE position and increase Pt's attachment to this UE.   At elevated table top, engaged Pt in dynamic standing balance visual scanning activity with cross midline reaching incorporated into activity to increase strength, dynamic balance, and L side attention for improved participation in BADLs. Pt able to  maintain standing balance while using R UE in functional reaching task with heavy min A with OT maintaining L LE position on ground and L UE position on table top. Pt instructed to visually scan to L to locate specified colored cone and then utilize R UE to retrieve and transport cone to R side of table with min-mod verbal cues required for visual scanning. Following each rep, Pt instructed to "reset" to correct trunk alignment and bring weight back to midline with Pt able to complete with verbal cues only, no external visual cue required!! Pt completed 3x10 reps tolerating standing ~3 minutes per trial with seated rest breaks provided between and following trials.   While seated at table top in [facility], engaged Pt in L UE NMRE activity to increase Pt's awareness of L UE positioning in space. Educated Pt on using her eyes to accommodate for decreased proprioception/sensation in L UE to know where her arm is in space with Pt receptive to education and motivated to apply strategy to functional activities. Worked on utilizing L UE to retrieve colored hoops and transport them across rainbow arch utilizing arch an achor to guide the movement. Pt responded well to intervention, learning technique of watching L UE while it is moving, however d/t significance of Pt's deficits this was extremely difficult to complete with mod HOH and max verbal cues required.   Transported Pt back to room total A in [place] for energy conservation. Pt was left resting in [facility] with call bell in reach, seatbelt alarm on, and all needs met.    PM Session:  Pt received lightly sleeping in [facility], waking upon OT  arrival. Pt presenting to be in good spirits receptive to skilled OT session reporting 0/10 pain- OT offering intermittent rest breaks, repositioning, and therapeutic support to optimize participation in therapy session. Pt requesting to take shower this PM- focused session on dynamic sitting balance, BADL modifications, L side attention,  and sit<>stands with shower level BADLs. Transported Pt total A into bathroom for energy conservation and time management. Re-educated Pt on stand pivot technique using grab bars with Pt verbalizing understanding and completing transfer with heavy min A for balance and to maintain L U/LE positioning and mod verbal cues for technique. Pt recalled technique to doff shirt using hemi-dressing method with assistance required to fully bring shirt off head d/t tight fit of turtle neck. Pt stood while holding grab bars min A while OT doffed pants from waist. During shower, Pt maintained sitting balance on TTB with close supervision-CGA using R UE intermittently supported on grab bar. Pt able to complete UB bathing with min A (assistance required to wash R UE) and LB bathing with min A to wash buttocks in standing position while holding onto grab bars. Pt was able to learn forwards towards ground to wash feet, lower B LEs, and wash bilateral upper legs with close supervision. Mod verbal cues provided for sequencing, attention, and awareness during bathing tasks. Pt with improved awareness of L UE positioning during bathing, improved sitting balance, and improved dynamic standing balance during LB bathing. Stand pivot TTB>TISWC min A +2 for safety and balance. Following shower, Pt completed U/LB dressing seated in [facility]. Pt able to recall correct sequence for hemi-dressing with min questioning cues. Educated Pt on alternate method for donning pants by crossing legs and holding pants in R hand, weaving L LE first- Pt able to demonstrate teach back as evidence of learning with assistance require only d/t tight fit of pants. Pt stood while holding onto therapist's waist band with min A and maintained standing balance min A while OT brought Pt's pants to waist. Pt required assistance with weaving L UE into pull over shirt and then she was able to bring shirt overhead and weave L UE with verbal cues only! Socks/shoes donned total  A d/t fatigue. Pt was left resting in [facility] with call bell in reach, seatbelt alarm on, and all needs met.     Therapy Documentation Precautions:  Precautions Precautions: Fall, Other (comment) Precaution Comments: L hemiparesis, L visual field cut, L inattention, L hemisensory loss Restrictions Weight Bearing Restrictions Per Provider Order: No   Therapy/Group: Individual Therapy  Clide Deutscher 03/05/2023, 8:02 AM

## 2023-03-06 MED ORDER — SENNOSIDES-DOCUSATE SODIUM 8.6-50 MG PO TABS
1.0000 | ORAL_TABLET | Freq: Two times a day (BID) | ORAL | Status: AC
  Administered 2023-03-06 – 2023-03-08 (×6): 1 via ORAL
  Filled 2023-03-06 (×6): qty 1

## 2023-03-06 NOTE — Progress Notes (Signed)
PROGRESS NOTE   Subjective/Complaints:  Pt doing well, but slept poorly because she was cold last night. Denies pain. Unsure of LBM, looks like last documented one was 1/20. Urinating fine. Denies any other complaints or concerns.   ROS-denies chest pain or shortness of breath.  Denies abdominal pain, n/v/d.  poor sensation left side , L neglect, - no breathing issues   Objective:   No results found.  No results for input(s): "WBC", "HGB", "HCT", "PLT" in the last 72 hours.   No results for input(s): "NA", "K", "CL", "CO2", "GLUCOSE", "BUN", "CREATININE", "CALCIUM" in the last 72 hours.    Intake/Output Summary (Last 24 hours) at 03/06/2023 1057 Last data filed at 03/06/2023 0858 Gross per 24 hour  Intake 240 ml  Output --  Net 240 ml         Physical Exam: Vital Signs Blood pressure (!) 160/78, pulse 69, temperature 97.8 F (36.6 C), temperature source Oral, resp. rate 18, height 5\' 6"  (1.676 m), weight 64.2 kg, SpO2 96%.  General: No acute distress, resting in bed, singing Mood and affect are appropriate Heart: Regular rate and rhythm no rubs murmurs or extra sounds Lungs: Clear to auscultation, breathing unlabored, no rales or wheezes Abdomen: Positive bowel sounds, soft nontender to palpation, nondistended Extremities: No clubbing, cyanosis, or edema Skin: No evidence of breakdown, no evidence of rash over exposed surfaces Neuro: Speech with moderate dysarthria   PRIOR EXAMS: Musculoskeletal: limited Left knee flexion to 90 degrees . 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  Left hemineglect Absent sensation to light touch left upper and lower extremities Musculoskeletal: left knee does not flex beyond 90 deg, has pain at end range  No joint swelling No significant left arm tenderness or pain with PROM noted, neg impingement signs No edema   motor strength is 5/5 in right deltoid,  bicep, tricep, grip, hip flexor, knee extensors, ankle dorsiflexor and plantar flexor Left side difficult to examine due to severe sensory ataxia but at least 4/5 Wandering LUE movements  Sensory exam absent sensation to light touch and proprioception in LEFT upper and lower extremities     Assessment/Plan: 1. Functional deficits which require 3+ hours per day of interdisciplinary therapy in a comprehensive inpatient rehab setting. Physiatrist is providing close team supervision and 24 hour management of active medical problems listed below. Physiatrist and rehab team continue to assess barriers to discharge/monitor patient progress toward functional and medical goals  Care Tool:  Bathing    Body parts bathed by patient: Left arm, Chest, Abdomen, Front perineal area, Right upper leg, Left upper leg, Face   Body parts bathed by helper: Right arm, Buttocks, Left lower leg, Right lower leg     Bathing assist Assist Level: Moderate Assistance - Patient 50 - 74%     Upper Body Dressing/Undressing Upper body dressing   What is the patient wearing?: Pull over shirt    Upper body assist Assist Level: Moderate Assistance - Patient 50 - 74%    Lower Body Dressing/Undressing Lower body dressing      What is the patient wearing?: Pants, Underwear/pull up     Lower body assist  Assist for lower body dressing: Maximal Assistance - Patient 25 - 49%     Toileting Toileting    Toileting assist Assist for toileting: Total Assistance - Patient < 25%     Transfers Chair/bed transfer  Transfers assist  Chair/bed transfer activity did not occur: Safety/medical concerns (unsafe to get up)  Chair/bed transfer assist level: Minimal Assistance - Patient > 75% (squat pivot)     Locomotion Ambulation   Ambulation assist   Ambulation activity did not occur: Safety/medical concerns (requires skilled assistance)  Assist level: 2 helpers Assistive device: Other (comment) (R hallway  rail) Max distance: 27ft   Walk 10 feet activity   Assist  Walk 10 feet activity did not occur: Safety/medical concerns  Assist level: 2 helpers Assistive device: Other (comment) (hand rail.)   Walk 50 feet activity   Assist Walk 50 feet with 2 turns activity did not occur: Safety/medical concerns         Walk 150 feet activity   Assist Walk 150 feet activity did not occur: Safety/medical concerns         Walk 10 feet on uneven surface  activity   Assist Walk 10 feet on uneven surfaces activity did not occur: Safety/medical concerns         Wheelchair     Assist Is the patient using a wheelchair?: Yes Type of Wheelchair: Manual    Wheelchair assist level: Total Assistance - Patient < 25% Max wheelchair distance: 39ft    Wheelchair 50 feet with 2 turns activity    Assist        Assist Level: Dependent - Patient 0%   Wheelchair 150 feet activity     Assist      Assist Level: Dependent - Patient 0%   Blood pressure (!) 160/78, pulse 69, temperature 97.8 F (36.6 C), temperature source Oral, resp. rate 18, height 5\' 6"  (1.676 m), weight 64.2 kg, SpO2 96%.  Medical Problem List and Plan: 1. 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  Antithrombotics: -DVT/anticoagulation:  Pharmaceutical: lovenox 40mg  qd -antiplatelet therapy: Aspirin and Brilinta for three 90 days followed by aspirin alone   -hx left CEA 3. Pain Management: Tylenol as needed   -1/17 gabapentin 100 mg 3 times daily for  neuropathic pain-- helping -Severe left knee OA with contracture , pain ok unless knee is flexed to >90deg  4. Mood/Behavior/Sleep: LCSW to evaluate and provide emotional support             -antipsychotic agents: n/a              - Melatonin PRN   5. Neuropsych/cognition: This patient is not quite capable of making decisions on her own behalf.   6. Skin/Wound Care: Routine skin care checks   7. Fluids/Electrolytes/Nutrition: Routine Is and Os and follow-up chemistries   8: Hypertension: monitor TID and prn (Diovan 320 mg daily at home) On avapro 150mg  will  increase to 300mg   1/17 fair control continue current regimen and monitor Restart amlodipine , low dose 2.5 mg, improved 1/24 -03/06/23 BP a bit variable, recent med change, monitor for now Vitals:   03/02/23 1933 03/03/23 0524 03/03/23 0913 03/03/23 1245  BP: (!) 181/83 (!) 192/90 (!) 148/90 (!) 147/72   03/03/23 2012 03/04/23 0250 03/04/23 1159 03/04/23 1307  BP: 137/87 (!) 148/70 106/66 124/76   03/04/23 2012 03/05/23 1440 03/05/23 1931 03/06/23 0536  BP: 135/79 128/70 (!) 154/79 (!) 160/78    9: Hyperlipidemia: continue atorvastatin 80mg  daily   10: DM-2: A1c = 6.4% on October, 2024 (no home meds)  -1/16 glucose stable 127 on BMP 1/13  11: Elevated serum creatinine/GFR ~55: ? Mild CKD; near baseline ~1.0             -Monitor on increased ARB , recheck 1/20 -Continue to encourage and monitor fluid intake       Latest Ref Rng & Units 03/01/2023    5:08 AM 02/22/2023    5:00 AM 02/19/2023    9:41 AM  BMP  Glucose 70 - 99 mg/dL 161  096  045   BUN 8 - 23 mg/dL 26  22  13    Creatinine 0.44 - 1.00 mg/dL 4.09  8.11  9.14   Sodium 135 - 145 mmol/L 136  137  138   Potassium 3.5 - 5.1 mmol/L 4.3  3.8  3.8   Chloride 98 - 111 mmol/L 105  102  100   CO2 22 - 32 mmol/L 22  27  27    Calcium 8.9 - 10.3 mg/dL 8.9  8.8  8.9      12. Eye redness improved   -Artificial tears as needed ordered, continue to monitor  13.  Constipation -1/17 appears she had a few bowel movements yesterday however she still reported feeling constipated.  It does appear that she had not had a bowel movement for a few days prior to this so may still still have some stool in her colon.  Will schedule MiraLAX, add sorbitol as needed -02/27/23 although reporting no BM in 2 days, she had 3 yesterday-- leave meds for now, but if continues to have frequent loose stools, may need to back off -02/28/23 no BM since 1/17, monitor closely for BMs today, adjust meds as needed 1/20 last BM 1/18 will need to increase miralax to BID 1/21 cont BM at 2309- no BM on 1/22 -03/06/23 no BM since 03/01/23, will add senokot S 1 tab BID x3 days, want to avoid long term stimulant laxative but hopefully we will get her bowels moving this weekend.    LOS: 16 days A FACE TO FACE EVALUATION WAS PERFORMED  [address] 03/06/2023, 10:57 AM

## 2023-03-06 NOTE — Progress Notes (Signed)
Speech Language Pathology Weekly Progress and Session Note  Patient Details  Name: Kathleen Figueroa MRN: [id number] Date of Birth: [birth year]  Beginning of progress report period: February 27, 2023 End of progress report period: March 06, 2023  Today's Date: 03/06/2023 SLP Individual Time: 2440-1027 SLP Individual Time Calculation (min): 45 min  Short Term Goals: Week 2: SLP Short Term Goal 1 (Week 2): Patient will utilize swallowing compensatory strategies to reduce s/sx of aspiration during consumption of PO given min multimodal A SLP Short Term Goal 1 - Progress (Week 2): Not met SLP Short Term Goal 2 (Week 2): Patient will demonstrate problem solving in basic daily situations given min multimodal A SLP Short Term Goal 2 - Progress (Week 2): Not met SLP Short Term Goal 3 (Week 2): Patient will recall and utilize memory compensatory strategies given min multimodal A SLP Short Term Goal 3 - Progress (Week 2): Met SLP Short Term Goal 4 (Week 2): Patient will attend to L side during functional tasks given mod multimodal A SLP Short Term Goal 4 - Progress (Week 2): Not met    New Short Term Goals: Week 3: SLP Short Term Goal 1 (Week 3): Patient will utilize swallowing compensatory strategies to reduce s/sx of aspiration during consumption of PO given min multimodal A SLP Short Term Goal 2 (Week 3): Patient will demonstrate problem solving in basic daily situations given min multimodal A SLP Short Term Goal 3 (Week 3): Patient will attend to L side during functional tasks given mod multimodal A  Weekly Progress Updates:  Pt has met 1 of 4 STGs this reporting period due to improved cognitive skills. She is able to use external memory aids with min A. She continues to require mod-max A to attend to L visual field and mod A for use of compensatory swallowing strategies, and problem solving. Pt/family eduction ongoing. Pt would benefit from continued ST intervention to maximize dysphagia and  cognition in order to maximize functional independence at d/c.   Intensity: Minumum of 1-2 x/day, 30 to 90 minutes Frequency: 3 to 5 out of 7 days Duration/Length of Stay:   Treatment/Interventions: Cognitive remediation/compensation;Dysphagia/aspiration precaution training;Internal/external aids;Cueing hierarchy;Therapeutic Activities;Functional tasks;Patient/family education;Therapeutic Exercise   Daily Session  Skilled Therapeutic Interventions:     Pt seen for ST targeting dysphagia and cognitive linguistic skills. Pt upright in bed eating breakfast with NT providing full supervision upon SLP arrival. Pt consumed Dys 2 eggs, sausage, and oatmeal with no overt s/sx of aspiration. She required mod A to use compensatory swallowing strategies. Pt with noticeable inattention to left part of meal tray and required mod verbal cues to turn towards those items. After pt had completed meal, SLP facilitated matching activity targeting L inattention and pt required mod-max A depending on how far to the left items were placed. Pt independently recalled names of NT and RN but required min A to use external memory aid to orient to other functional information for this environment. Pt left in bed with call bell in reach and bed alarm activated. Continue ST POC.   General    Pain Pain Assessment Pain Scale: 0-10 Pain Score: 0-No pain  Therapy/Group: Individual Therapy  Alphonsus Sias 03/06/2023, 12:31 PM

## 2023-03-07 MED ORDER — SORBITOL 70 % SOLN
30.0000 mL | Freq: Once | Status: AC
  Administered 2023-03-07: 30 mL via ORAL

## 2023-03-07 NOTE — Progress Notes (Signed)
PROGRESS NOTE   Subjective/Complaints:  Pt doing well again today, slept well. Denies pain. Still no BM yesterday, nursing staff reporting she only had a small smear last night despite all the meds including some sorbitol; pt willing to try sorbitol again today, if still no BM then willing to try enema. Urinating fine. Denies any other complaints or concerns.   ROS-denies chest pain or shortness of breath.  Denies abdominal pain, n/v/d.  poor sensation left side , L neglect, - no breathing issues   Objective:   No results found.  No results for input(s): "WBC", "HGB", "HCT", "PLT" in the last 72 hours.   No results for input(s): "NA", "K", "CL", "CO2", "GLUCOSE", "BUN", "CREATININE", "CALCIUM" in the last 72 hours.    Intake/Output Summary (Last 24 hours) at 03/07/2023 1159 Last data filed at 03/07/2023 0820 Gross per 24 hour  Intake 715 ml  Output --  Net 715 ml         Physical Exam: Vital Signs Blood pressure (!) 156/67, pulse 77, temperature (!) 97.5 F (36.4 C), resp. rate 18, height 5\' 6"  (1.676 m), weight 64.2 kg, SpO2 98%.  General: No acute distress, resting in bed, comfortable Mood and affect are appropriate Heart: Regular rate and rhythm no rubs murmurs or extra sounds Lungs: Clear to auscultation, breathing unlabored, no rales or wheezes Abdomen: Positive bowel sounds, soft nontender to palpation, nondistended Extremities: No clubbing, cyanosis, or edema Skin: No evidence of breakdown, no evidence of rash over exposed surfaces Neuro: Speech with moderate dysarthria   PRIOR EXAMS: Musculoskeletal: limited Left knee flexion to 90 degrees . 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  Left hemineglect Absent sensation to light touch left upper and lower extremities Musculoskeletal: left knee does not flex beyond 90 deg, has pain at end range  No joint swelling No significant left arm  tenderness or pain with PROM noted, neg impingement signs No edema   motor strength is 5/5 in right deltoid, bicep, tricep, grip, hip flexor, knee extensors, ankle dorsiflexor and plantar flexor Left side difficult to examine due to severe sensory ataxia but at least 4/5 Wandering LUE movements  Sensory exam absent sensation to light touch and proprioception in LEFT upper and lower extremities     Assessment/Plan: 1. Functional deficits which require 3+ hours per day of interdisciplinary therapy in a comprehensive inpatient rehab setting. Physiatrist is providing close team supervision and 24 hour management of active medical problems listed below. Physiatrist and rehab team continue to assess barriers to discharge/monitor patient progress toward functional and medical goals  Care Tool:  Bathing    Body parts bathed by patient: Left arm, Chest, Abdomen, Front perineal area, Right upper leg, Left upper leg, Face   Body parts bathed by helper: Right arm, Buttocks, Left lower leg, Right lower leg     Bathing assist Assist Level: Moderate Assistance - Patient 50 - 74%     Upper Body Dressing/Undressing Upper body dressing   What is the patient wearing?: Pull over shirt    Upper body assist Assist Level: Moderate Assistance - Patient 50 - 74%    Lower Body Dressing/Undressing Lower body  dressing      What is the patient wearing?: Pants, Underwear/pull up     Lower body assist Assist for lower body dressing: Maximal Assistance - Patient 25 - 49%     Toileting Toileting    Toileting assist Assist for toileting: Total Assistance - Patient < 25%     Transfers Chair/bed transfer  Transfers assist  Chair/bed transfer activity did not occur: Safety/medical concerns (unsafe to get up)  Chair/bed transfer assist level: Minimal Assistance - Patient > 75% (squat pivot)     Locomotion Ambulation   Ambulation assist   Ambulation activity did not occur: Safety/medical  concerns (requires skilled assistance)  Assist level: 2 helpers Assistive device: Other (comment) (R hallway rail) Max distance: 63ft   Walk 10 feet activity   Assist  Walk 10 feet activity did not occur: Safety/medical concerns  Assist level: 2 helpers Assistive device: Other (comment) (hand rail.)   Walk 50 feet activity   Assist Walk 50 feet with 2 turns activity did not occur: Safety/medical concerns         Walk 150 feet activity   Assist Walk 150 feet activity did not occur: Safety/medical concerns         Walk 10 feet on uneven surface  activity   Assist Walk 10 feet on uneven surfaces activity did not occur: Safety/medical concerns         Wheelchair     Assist Is the patient using a wheelchair?: Yes Type of Wheelchair: Manual    Wheelchair assist level: Total Assistance - Patient < 25% Max wheelchair distance: 67ft    Wheelchair 50 feet with 2 turns activity    Assist        Assist Level: Dependent - Patient 0%   Wheelchair 150 feet activity     Assist      Assist Level: Dependent - Patient 0%   Blood pressure (!) 156/67, pulse 77, temperature (!) 97.5 F (36.4 C), resp. rate 18, height 5\' 6"  (1.676 m), weight 64.2 kg, SpO2 98%.  Medical Problem List and Plan: 1. 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  Antithrombotics: -DVT/anticoagulation:  Pharmaceutical: lovenox 40mg  qd -antiplatelet therapy: Aspirin and Brilinta for three 90 days followed by aspirin alone   -hx  left CEA 3. Pain Management: Tylenol as needed   -1/17 gabapentin 100 mg 3 times daily for neuropathic pain-- helping -Severe left knee OA with contracture , pain ok unless knee is flexed to >90deg  4. Mood/Behavior/Sleep: LCSW to evaluate and provide emotional support             -antipsychotic agents: n/a              - Melatonin PRN   5. Neuropsych/cognition: This patient is not quite capable of making decisions on her own behalf.   6. Skin/Wound Care: Routine skin care checks   7. Fluids/Electrolytes/Nutrition: Routine Is and Os and follow-up  chemistries   8: Hypertension: monitor TID and prn (Diovan 320 mg daily at home) On avapro 150mg  will increase to 300mg   1/17 fair control continue current regimen and monitor Restart amlodipine , low dose 2.5 mg, improved 1/24 -1/25-26/25 BP a bit variable, recent med change, monitor for now Vitals:   03/03/23 1245 03/03/23 2012 03/04/23 0250 03/04/23 1159  BP: (!) 147/72 137/87 (!) 148/70 106/66   03/04/23 1307 03/04/23 2012 03/05/23 1440 03/05/23 1931  BP: 124/76 135/79 128/70 (!) 154/79   03/06/23 0536 03/06/23 1405 03/06/23 2004 03/07/23 0525  BP: (!) 160/78 (!) 144/73 (!) 172/76 (!) 156/67    9: Hyperlipidemia: continue atorvastatin 80mg  daily   10: DM-2: A1c = 6.4% on October, 2024 (no home meds)  -1/16 glucose stable 127 on BMP 1/13  11: Elevated serum creatinine/GFR ~55: ? Mild CKD; near baseline ~1.0             -Monitor on increased ARB , recheck 1/20 -Continue to encourage and monitor fluid intake       Latest Ref Rng & Units 03/01/2023    5:08 AM 02/22/2023    5:00 AM 02/19/2023    9:41 AM  BMP  Glucose 70 - 99 mg/dL 010  272  536   BUN 8 - 23 mg/dL 26  22  13    Creatinine 0.44 - 1.00 mg/dL 6.44  0.34  7.42   Sodium 135 - 145 mmol/L 136  137  138   Potassium 3.5 - 5.1 mmol/L 4.3  3.8  3.8   Chloride 98 - 111 mmol/L 105  102  100   CO2 22 - 32 mmol/L 22  27  27    Calcium 8.9 - 10.3 mg/dL 8.9  8.8  8.9      12.  Eye redness improved   -Artificial tears as needed ordered, continue to monitor  13. Constipation -1/17 appears she had a few bowel movements yesterday however she still reported feeling constipated.  It does appear that she had not had a bowel movement for a few days prior to this so may still still have some stool in her colon.  Will schedule MiraLAX, add sorbitol as needed -02/27/23 although reporting no BM in 2 days, she had 3 yesterday-- leave meds for now, but if continues to have frequent loose stools, may need to back off -02/28/23 no BM since 1/17, monitor closely for BMs today, adjust meds as needed 1/20 last BM 1/18 will need to increase miralax to BID 1/21 cont BM at 2309- no BM on 1/22 -03/06/23 no BM since 03/01/23, will add senokot S 1 tab BID x3 days, want to avoid long term stimulant laxative but hopefully we will get her bowels moving this weekend.  -03/07/23 still no BM so tried one more dose of sorbitol this morning and finally had a BM; leave meds in place for now, if oversoftened or moving bowels too frequently, then could back off this week.   LOS: 17 days A FACE TO FACE EVALUATION WAS PERFORMED  [address] 03/07/2023, 11:59 AM

## 2023-03-08 ENCOUNTER — Inpatient Hospital Stay (HOSPITAL_COMMUNITY): Payer: Medicare HMO

## 2023-03-08 LAB — CBC
HCT: 38.6 % (ref 36.0–46.0)
Hemoglobin: 12.6 g/dL (ref 12.0–15.0)
MCH: 28.1 pg (ref 26.0–34.0)
MCHC: 32.6 g/dL (ref 30.0–36.0)
MCV: 86 fL (ref 80.0–100.0)
Platelets: 203 10*3/uL (ref 150–400)
RBC: 4.49 MIL/uL (ref 3.87–5.11)
RDW: 14.6 % (ref 11.5–15.5)
WBC: 4.8 10*3/uL (ref 4.0–10.5)
nRBC: 0 % (ref 0.0–0.2)

## 2023-03-08 LAB — BASIC METABOLIC PANEL
Anion gap: 9 (ref 5–15)
BUN: 27 mg/dL — ABNORMAL HIGH (ref 8–23)
CO2: 22 mmol/L (ref 22–32)
Calcium: 8.6 mg/dL — ABNORMAL LOW (ref 8.9–10.3)
Chloride: 104 mmol/L (ref 98–111)
Creatinine, Ser: 1.3 mg/dL — ABNORMAL HIGH (ref 0.44–1.00)
GFR, Estimated: 40 mL/min — ABNORMAL LOW (ref >60)
Glucose, Bld: 120 mg/dL — ABNORMAL HIGH (ref 70–99)
Potassium: 4.2 mmol/L (ref 3.5–5.1)
Sodium: 135 mmol/L (ref 135–145)

## 2023-03-08 MED ORDER — AMLODIPINE BESYLATE 5 MG PO TABS
5.0000 mg | ORAL_TABLET | Freq: Every day | ORAL | Status: DC
Start: 2023-03-09 — End: 2023-03-10
  Administered 2023-03-09 – 2023-03-10 (×2): 5 mg via ORAL
  Filled 2023-03-08 (×2): qty 1

## 2023-03-08 NOTE — Progress Notes (Signed)
Physical Therapy Session Note  Patient Details  Name: Kathleen Figueroa MRN: [id number] Date of Birth: 1935-06-01  Today's Date: 03/08/2023 PT Individual Time: 1115-1200 PT Individual Time Calculation (min): 45 min   Short Term Goals:    Week 3:  PT Short Term Goal 1 (Week 3): Pt will perform supine<>sit using hospital bed features as needed with CGA PT Short Term Goal 2 (Week 3): Pt will perform bed<>chair transfers with CGA and mod verbal cuing PT Short Term Goal 3 (Week 3): Pt will perform sit<>stands using LRAD (not hallway rail) with min A PT Short Term Goal 4 (Week 3): Pt will perform w/c propulsion at least 53ft with min A PT Short Term Goal 5 (Week 3): Pt will continue participating in gait training using LRAD with mod A and +2 assist as needed   Skilled Therapeutic Interventions/Progress Updates:    Pt presents in TIS and eager for therapy though states she is worn out from the last session. Initially focused on midline orientation and L attention to find therapist while talking and relaying course of her morning. NMR for gait training with focus on postural control, LLE activation/coordination/control and trunk/hip extension in stance - required mod assist overall for assist with LLE foot placement due to decreased sensation and awareness, glute activation (tactile input) and verbal cues for stepping x 25' and then x 5' but as fatigued body would turn L bias and decreased ability to reorient to midline. Shifted focus to standing at table top high low table with focus on functional reaching to the L to locate set items and pass to the R and then in seated position focused on activation and control of LUE (aka "sunshine")  to reach for cone x 3 reps with improved overall ability to perform task with minimal support and max verbal cues! Pt required min to mod assist for sit <> stands throughout session with cues for technique and hand placement. Dynamic standing balance with mod assist overall.  End of session set up in TIS with safety belt donned and all needs in reach.   Therapy Documentation Precautions:  Precautions Precautions: Fall, Other (comment) Precaution Comments: L hemiparesis, L visual field cut, L inattention, L hemisensory loss Restrictions Weight Bearing Restrictions Per Provider Order: No    Pain: Reports intermittent "brain freeze" pain in L thigh with mobility/WB and reports sometimes it goes in her arm as well. Resolves with rest and time.    Therapy/Group: Individual Therapy  Karolee Stamps Darrol Poke, PT, DPT, CBIS  03/08/2023, 12:05 PM

## 2023-03-08 NOTE — Plan of Care (Signed)
Problem: Consults Goal: RH STROKE PATIENT EDUCATION Description: See Patient Education module for education specifics  Outcome: Progressing   Problem: RH BOWEL ELIMINATION Goal: RH STG MANAGE BOWEL WITH ASSISTANCE Description: STG Manage Bowel with MOD I Assistance. Outcome: Progressing Goal: RH STG MANAGE BOWEL W/MEDICATION W/ASSISTANCE Description: STG Manage Bowel with Medication with MOD I Assistance. Outcome: Progressing   Problem: RH BLADDER ELIMINATION Goal: RH STG MANAGE BLADDER WITH ASSISTANCE Description: STG Manage Bladder With toileting Assistance Outcome: Progressing   Problem: RH SAFETY Goal: RH STG ADHERE TO SAFETY PRECAUTIONS W/ASSISTANCE/DEVICE Description: STG Adhere to Safety Precautions With Cues  Outcome: Progressing   Problem: RH KNOWLEDGE DEFICIT Goal: RH STG INCREASE KNOWLEDGE OF DIABETES Description: Patient and daughter will be able to manage independently with educational resources provided Outcome: Progressing Goal: RH STG INCREASE KNOWLEDGE OF HYPERTENSION Outcome: Progressing Goal: RH STG INCREASE KNOWLEGDE OF HYPERLIPIDEMIA Description: Patient and daughter will be able to manage independently with educational resources provided Outcome: Progressing Goal: RH STG INCREASE KNOWLEDGE OF STROKE PROPHYLAXIS Description: Patient and daughter will be able to manage independently with educational resources provided Outcome: Progressing   Problem: RH Vision Goal: RH LTG Vision (Specify) Outcome: Progressing

## 2023-03-08 NOTE — Procedures (Signed)
Modified Barium Swallow Study  Patient Details  Name: [name] MRN: [id number] Date of Birth: 02-28-1935  Today's Date: 03/08/2023  Modified Barium Swallow completed.  Full report located under Chart Review in the Imaging Section.  History of Present Illness Kathleen Figueroa is an 88 yo female presenting to ED 1/2 with L arm weakness and possible fall. MRI Brain shows large acute R PCA infarct. Previously seen by SLP 11/13/22 with occasional word-finding problems that persisted since prior CVA, but otherwise functional auditory comprehension and abstract language. PMH includes HTN, HLD, CAD, prior CVA. Patient has been tolerating Dys2/thin liquid diet clinically, however SLP recommended MBS to rule out silent aspiration and assess appropriateness for diet upgrade.  Clinical Impression Patient presents with trace to mild oral dysphagia and pharyngeal swallowing function that is grossly WFL. Orally, patient exhibits somewhat prolonged mastication with solids but eventually achieves complete recollection and only trace residue on the tongue/palate after the initial swallow. Pharyngeally, patient demonstrates adequate BOT retraction, hyolaryngeal elevation/excursion, epiglottic inversion, pharyngeal stripping wave, and clearance through the UES. No significant residue nor events of penetration/aspiration observed across consistencies. No esophageal retention or retrograde flow noted during brief, non-diagnostic esophageal sweep with pill/puree. Recommend upgrade to Dys3/thin liquids with full supervision to assist with feeding and cue for safe swallowing strategies. Recommend medications whole in puree per patient preference. SLP will continue to follow to assess tolerance.  DIGEST Pharyngeal Swallow Severity Rating*   Safety: 0  Efficiency: 0  Overall Pharyngeal Swallow Severity: 0 - WFL  0: WFL 1: mild; 2: moderate; 3: severe; 4: profound   *The Dynamic Imaging Grade of Swallowing Toxicity is  standardized for the head and neck cancer population, however, demonstrates promising clinical applications across populations to standardize the clinical rating of pharyngeal swallow safety and severity.   Factors that may increase risk of adverse event in presence of aspiration [employer]ye [employer]aks [employer] Clearance Coots 2021): Frail or deconditioned;Reduced cognitive function  Swallow Evaluation Recommendations Recommendations: PO diet PO Diet Recommendation: Dysphagia 3 (Mechanical soft);Thin liquids (Level 0) Liquid Administration via: Cup;Straw Medication Administration: Whole meds with puree Supervision: Patient able to self-feed;Full supervision/cueing for swallowing strategies Swallowing strategies  : Minimize environmental distractions Postural changes: Position pt fully upright for meals Oral care recommendations: Oral care BID (2x/day)  Jeannie Done, M.A., CCC-SLP  Morrie Sheldon A Ravindra Baranek 03/08/2023,12:12 PM

## 2023-03-08 NOTE — Progress Notes (Signed)
PROGRESS NOTE   Subjective/Complaints:  Severe L neglect requiring cuing to attend   ROS-denies chest pain or shortness of breath.  Denies abdominal pain, n/v/d.  poor sensation left side , L neglect, - no breathing issues   Objective:   No results found.  Recent Labs    03/08/23 0548  WBC 4.8  HGB 12.6  HCT 38.6  PLT 203     Recent Labs    03/08/23 0548  NA 135  K 4.2  CL 104  CO2 22  GLUCOSE 120*  BUN 27*  CREATININE 1.30*  CALCIUM 8.6*      Intake/Output Summary (Last 24 hours) at 03/08/2023 0837 Last data filed at 03/08/2023 0981 Gross per 24 hour  Intake 596 ml  Output --  Net 596 ml         Physical Exam: Vital Signs Blood pressure (!) 151/68, pulse 88, temperature 98 F (36.7 C), resp. rate 17, height 5\' 6"  (1.676 m), weight 64.2 kg, SpO2 99%.  General: No acute distress, resting in bed, comfortable Mood and affect are appropriate Heart: Regular rate and rhythm no rubs murmurs or extra sounds Lungs: Clear to auscultation, breathing unlabored, no rales or wheezes Abdomen: Positive bowel sounds, soft nontender to palpation, nondistended Extremities: No clubbing, cyanosis, or edema Skin: No evidence of breakdown, no evidence of rash over exposed surfaces Neuro: Speech with moderate dysarthria   PRIOR EXAMS: Musculoskeletal: limited Left knee flexion to 90 degrees . 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  Left hemineglect Absent sensation to light touch left upper and lower extremities Musculoskeletal: left knee does not flex beyond 90 deg, has pain at end range  No joint swelling No significant left arm tenderness or pain with PROM noted, neg impingement signs No edema   motor strength is 5/5 in right deltoid, bicep, tricep, grip, hip flexor, knee extensors, ankle dorsiflexor and plantar flexor Left side difficult to examine due to severe sensory ataxia but at  least 4/5 Wandering LUE movements  Sensory exam absent sensation to light touch and proprioception in LEFT upper and lower extremities     Assessment/Plan: 1. Functional deficits which require 3+ hours per day of interdisciplinary therapy in a comprehensive inpatient rehab setting. Physiatrist is providing close team supervision and 24 hour management of active medical problems listed below. Physiatrist and rehab team continue to assess barriers to discharge/monitor patient progress toward functional and medical goals  Care Tool:  Bathing    Body parts bathed by patient: Left arm, Chest, Abdomen, Front perineal area, Right upper leg, Left upper leg, Face   Body parts bathed by helper: Right arm, Buttocks, Left lower leg, Right lower leg     Bathing assist Assist Level: Moderate Assistance - Patient 50 - 74%     Upper Body Dressing/Undressing Upper body dressing   What is the patient wearing?: Pull over shirt    Upper body assist Assist Level: Moderate Assistance - Patient 50 - 74%    Lower Body Dressing/Undressing Lower body dressing      What is the patient wearing?: Pants, Underwear/pull up     Lower body assist Assist for lower body  dressing: Maximal Assistance - Patient 25 - 49%     Toileting Toileting    Toileting assist Assist for toileting: Total Assistance - Patient < 25%     Transfers Chair/bed transfer  Transfers assist  Chair/bed transfer activity did not occur: Safety/medical concerns (unsafe to get up)  Chair/bed transfer assist level: Minimal Assistance - Patient > 75% (squat pivot)     Locomotion Ambulation   Ambulation assist   Ambulation activity did not occur: Safety/medical concerns (requires skilled assistance)  Assist level: 2 helpers Assistive device: Other (comment) (R hallway rail) Max distance: 28ft   Walk 10 feet activity   Assist  Walk 10 feet activity did not occur: Safety/medical concerns  Assist level: 2  helpers Assistive device: Other (comment) (hand rail.)   Walk 50 feet activity   Assist Walk 50 feet with 2 turns activity did not occur: Safety/medical concerns         Walk 150 feet activity   Assist Walk 150 feet activity did not occur: Safety/medical concerns         Walk 10 feet on uneven surface  activity   Assist Walk 10 feet on uneven surfaces activity did not occur: Safety/medical concerns         Wheelchair     Assist Is the patient using a wheelchair?: Yes Type of Wheelchair: Manual    Wheelchair assist level: Total Assistance - Patient < 25% Max wheelchair distance: 63ft    Wheelchair 50 feet with 2 turns activity    Assist        Assist Level: Dependent - Patient 0%   Wheelchair 150 feet activity     Assist      Assist Level: Dependent - Patient 0%   Blood pressure (!) 151/68, pulse 88, temperature 98 F (36.7 C), resp. rate 17, height 5\' 6"  (1.676 m), weight 64.2 kg, SpO2 99%.  Medical Problem List and Plan: 1. 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  Antithrombotics: -DVT/anticoagulation:  Pharmaceutical: lovenox 40mg  qd -antiplatelet therapy: Aspirin and Brilinta for three 90 days followed by aspirin alone   -hx left CEA 3. Pain Management: Tylenol as needed   -1/17 gabapentin 100 mg 3 times daily for neuropathic pain-- helping -Severe left knee OA with contracture , pain ok unless knee is flexed to >90deg  4. Mood/Behavior/Sleep: LCSW to evaluate and provide emotional  support             -antipsychotic agents: n/a              - Melatonin PRN   5. Neuropsych/cognition: This patient is not quite capable of making decisions on her own behalf.   6. Skin/Wound Care: Routine skin care checks   7. Fluids/Electrolytes/Nutrition: Routine Is and Os and follow-up chemistries   8: Hypertension: monitor TID and prn (Diovan 320 mg daily at home) On avapro 150mg  will increase to 300mg   1/17 fair control  continue current regimen and monitor Restart amlodipine , low dose 2.5 mg, improved 1/24 -1/25-26/25 BP a bit variable, recent med change, monitor for now Vitals:   03/04/23 1159 03/04/23 1307 03/04/23 2012 03/05/23 1440  BP: 106/66 124/76 135/79 128/70   03/05/23 1931 03/06/23 0536 03/06/23 1405 03/06/23 2004  BP: (!) 154/79 (!) 160/78 (!) 144/73 (!) 172/76   03/07/23 0525 03/07/23 1419 03/07/23 2001 03/08/23 0422  BP: (!) 156/67 136/67 138/67 (!) 151/68  Increase amlodipine to 5mg   9: Hyperlipidemia: continue atorvastatin 80mg  daily   10: DM-2: A1c = 6.4% on October, 2024 (no home meds)  -1/16 glucose stable 127 on BMP 1/13  11: Elevated serum creatinine/GFR ~55: ? Mild CKD; near baseline ~1.0        -Continue to encourage and monitor fluid intake       Latest Ref Rng & Units 03/08/2023    5:48 AM 03/01/2023    5:08 AM 02/22/2023    5:00 AM  BMP  Glucose 70 - 99 mg/dL 098  119  147   BUN 8 - 23 mg/dL 27  26  22    Creatinine 0.44 - 1.00 mg/dL 8.29  5.62  1.30   Sodium 135 - 145 mmol/L 135  136  137   Potassium 3.5 - 5.1 mmol/L 4.2  4.3  3.8   Chloride 98 - 111 mmol/L 104  105  102   CO2 22 - 32 mmol/L 22  22  27    Calcium 8.9 - 10.3 mg/dL 8.6  8.9  8.8   Still with mildly elevated BUN/Creat enc fluids try  off ARB Enc fluids   12. Eye redness improved   -Artificial tears as needed ordered, continue to monitor  13. Constipation -1/17 appears she had a few bowel movements yesterday however she still reported feeling constipated.  It does appear  that she had not had a bowel movement for a few days prior to this so may still still have some stool in her colon.  Will schedule MiraLAX, add sorbitol as needed -02/27/23 although reporting no BM in 2 days, she had 3 yesterday-- leave meds for now, but if continues to have frequent loose stools, may need to back off -02/28/23 no BM since 1/17, monitor closely for BMs today, adjust meds as needed 1/20 last BM 1/18 will need to increase miralax to BID 1/21 cont BM at 2309- no BM on 1/22 -03/06/23 no BM since 03/01/23, will add senokot S 1 tab BID x3 days, want to avoid long term stimulant laxative but hopefully we will get her bowels moving this weekend.  -03/07/23 still no BM so tried one more dose of sorbitol this morning and finally had a BM; leave meds in place for now, if oversoftened or moving bowels too frequently, then could back off this week. Glycolax and senna  Had 2 lg type 4 stools yesterday  LOS: 18 days A FACE TO FACE EVALUATION WAS PERFORMED  Erick Colace 03/08/2023, 8:37 AM

## 2023-03-08 NOTE — Progress Notes (Signed)
Speech Language Pathology Weekly Progress Note  Patient Details  Name: Kathleen Figueroa MRN: [id number] Date of Birth: 03-20-1935  Beginning of progress report period: February 26, 2023 End of progress report period: March 08, 2023  Short Term Goals: Week 3: SLP Short Term Goal 1 (Week 3): Patient will utilize swallowing compensatory strategies to reduce s/sx of aspiration during consumption of PO given min multimodal A SLP Short Term Goal 1 - Progress (Week 3): Met SLP Short Term Goal 2 (Week 3): Patient will demonstrate problem solving in basic daily situations given min multimodal A SLP Short Term Goal 2 - Progress (Week 3): Met SLP Short Term Goal 3 (Week 3): Patient will attend to L side during functional tasks given mod multimodal A SLP Short Term Goal 3 - Progress (Week 3): Met  New Short Term Goals: Week 4: SLP Short Term Goal 1 (Week 4): Patient will utilize swallowing compensatory strategies to reduce s/sx of aspiration during consumption of PO given supervision A SLP Short Term Goal 2 (Week 4): Patient will demonstrate problem solving in basic daily situations given supervision multimodal A SLP Short Term Goal 3 (Week 4): Patient will attend to L side during functional tasks given min multimodal A  Weekly Progress Updates: Patient has made excellent progress meeting 3/3 short term goals set this reporting period. Patient demonstrates ability to solve basic problems with overall min assist, references prior strategies learned in therapy with supervision, and attends to the left side during functional tasks given mod multimodal assist. Patient underwent MBS and diet was upgraded to Dys3/thin liquid. SLP will continue to follow to assess tolerance. Patient currently utilizes swallowing compensatory strategies with min assist and exhibited limited overt s/sx of aspiration at most recent session. Patient and family education ongoing. Patient will continue to benefit from skilled therapy  services during remainder of CIR stay.    Intensity: Minumum of 1-2 x/day, 30 to 90 minutes Frequency: 3 to 5 out of 7 days Duration/Length of Stay: 2/4 Treatment/Interventions: Cognitive remediation/compensation;Dysphagia/aspiration precaution training;Internal/external aids;Cueing hierarchy;Therapeutic Activities;Functional tasks;Patient/family education;Therapeutic Exercise  Jeannie Done, M.A., CCC-SLP  Yetta Barre 03/08/2023, 12:33 PM

## 2023-03-08 NOTE — Progress Notes (Signed)
Occupational Therapy Weekly Progress Note  Patient Details  Name: Kathleen Figueroa MRN: [id number] Date of Birth: 1935-06-16  Beginning of progress report period: March 01, 2023 End of progress report period: March 08, 2023  Today's Date: 03/08/2023 OT Individual Time: 0945-1100 OT Individual Time Calculation (min): 75 min  OT Individual Time: 4098-1191 OT Individual Time Calculation (min): 59 min   Patient has met 3 of 4 short term goals. Mrs. Motter is motivate to participate in therapy sessions and is motivated to reach her long term goals. Although she has significant L hemibody proprioceptive, sensory, and visual deficits, she is demonstrating increased attention to her L visual field and L hemibody during functional activities with verbal cuing. She is able to recall hemidressing technique sequence with min questioning cues, complete UB dressing with mod A, LB dressing mod-max A, toileting max A, and toilet/shower stand pivot transfers using heaving min/mod A using grab bars. Pt's family is supportive and inquisitive regarding Pt's progress; Pt's family is discussing d/c plans of SNF vs home with 24/7 supervision and physical support.   Patient continues to demonstrate the following deficits: muscle weakness, decreased cardiorespiratoy endurance, impaired timing and sequencing, unbalanced muscle activation, decreased coordination, and decreased motor planning, decreased visual acuity, decreased visual perceptual skills, decreased visual motor skills, and field cut, decreased attention to left and decreased motor planning, decreased initiation, decreased attention, decreased awareness, decreased problem solving, decreased safety awareness, and decreased memory, central origin, and decreased sitting balance, decreased standing balance, decreased postural control, hemiplegia, and decreased balance strategies and therefore will continue to benefit from skilled OT intervention to enhance overall  performance with BADL and Reduce care partner burden.  Patient progressing toward long term goals..  Continue plan of care.  OT Short Term Goals Week 2:  OT Short Term Goal 1 (Week 2): Pt will locate 3/3 items in L visual field with min verbal cues. OT Short Term Goal 1 - Progress (Week 2): Met OT Short Term Goal 2 (Week 2): Pt will recall hemi-dressing techniques with min questioning cues. OT Short Term Goal 2 - Progress (Week 2): Met OT Short Term Goal 3 (Week 2): Pt will integrate LUE into functional tasks with Mod multimodal cuing. OT Short Term Goal 3 - Progress (Week 2): Progressing toward goal OT Short Term Goal 4 (Week 2): Pt will identify at least two visual compensatory techniques with min questioning cues. OT Short Term Goal 4 - Progress (Week 2): Met Week 3:  OT Short Term Goal 1 (Week 3): Pt will integrate LUE into functional tasks with Mod multimodal cuing. OT Short Term Goal 2 (Week 3): Pt will maintain sitting balance EOB/EOM in preparation for functional transfers with supervision OT Short Term Goal 3 (Week 3): Pt will complete toilet transfer with min A consistently OT Short Term Goal 4 (Week 3): Pt will complete toileting with mod A consistently  Skilled Therapeutic Interventions/Progress Updates:     Pt received reclined in bed presenting to be in good spirits receptive to skilled OT session reporting 0/10 pain- OT offering intermittent rest breaks, repositioning, and therapeutic support to optimize participation in therapy session. Focus this session dynamic balance, BADL retraining, and L side attention.   Mobility:  -Pt able to roll onto L side with max verbal cues for technique and bring B LEs off EOB using posterior hooking method following verbal education with tactile cues provided for sequencing. Pt transitioned to sitting EOB pushing up with R UE on bed rail with min A  and mod verbal cues for technique.  -Pt completed Squat pivot transfers to R TISWC > EOM with  min A +mod verbal cues to recall sequence and head hip relationship and light mod A to the L with same verbal cues required. -Stand step transfer TISWC <> BSC positioned over toilet using grab bar positioned on R light mod A for balance and positioning of L LE d/t proprioceptive deficits.   BADLs:  -Pt wearing clean shirt upon OT arrival, however without pants. Pt able to recall hemi-dressing technique for LB dressing with min questioning cues. Pt was able to weave L LE into pants by crossing L leg over R and reaching towards ground with CGA-min A provided for sitting balance in this position. Pt then weaved R LE with assistance required to fully bring pants over foot d/t tight fit of leggings. Pt stood with min A HHA +2 and assisted with bringing pants to waist, with assistance required to fully adjust pants over L hip.  -Toileting completed on BSC positioned over toilet and R UE positioned on grab bar. Pt able to complete anterior peri-care in seated position using R hand by leaning posteriorly on BSC. Pt stood with min A while holding grab bar and participated in bringing pants to waist, however assistance required to fully bring pants over hips on L side.   Therapeutic Activities:   -Engaged Pt in dynamic standing balance dual tasking activity with L visual scanning, L side attention, and functional reaching incorporated into activity to work on skilled required for toileting and dressing. Positioned vertical mirror anterior to Pt for increased external visual feedback with Pt able to correct postural alignment in mirror with verbal cues only. Pt able to tolerate standing in EVA walker with min A required to maintain L U/LE position d/t proprioceptive deficits 10 minutes x2 trials. Pt instructed to turn head to left to locate colored squigz and utilize R UE to place hoops over matching color squigz with mod verbal cues +increased time.    Pt transported total A <> therapy spaces this session in [facility]  for energy conservation and time management. Pt was left resting in TISWC with call bell in reach, seatbelt alarm on, and all needs met.    PM Session:  Pt received sitting up in [facility] dressed and ready for the day with all BADL needs met. RN present in room providing medication. Pt presenting to be in good spirits receptive to skilled OT session reporting 0/10 pain, however discomforted reported in L UE with heat pack provided at end of session- OT offering intermittent rest breaks, repositioning, and therapeutic support to optimize participation in therapy session. Focus this session sit<>stands, standing balance, L side attention, and L NMRE. Transported Pt <> therapy spaces this session total A in TISWC for energy conservation and time management.   Mobility:  -Pt completed Squat pivot transfers to R TISWC > EOM with min A +mod verbal cues to recall sequence and head hip relationship and light mod A to the L with same verbal cues required. Increased time required to locate [facility] when positioned in Pt's L visual field.   NMRE:  -Sitting EOM with large vertical mirror positioned anterior to Pt for increased visual feedback of body alignment, engaged Pt in dynamic sitting balance B UE functional reaching activity to work on awareness of L UE position, dynamic sitting balance, and B UE reciprocal use. Donned 2# wrist weight on Pt's L UE for increased proprioceptive feedback to increase motor control  during functional reach. Pt instructed to utilize B UE to lean forwards to retrieve large yellow ball from rehab tech, Lani, standing anterior to Pt. Max verbal cues required to attend to L UE position and utilize eye to compensate for sensory deficits and mod HOH A provided for improved motor control. Pt completed a partial sit-up between each trial leaning back towards mat and then returning trunk to midline with mod verbal cues and tactile cues provided- Pt with improved trunk control this session.  -Worked  on blocked practice of sit<>stands and standing balance from EOM at vertical mirror for increased visual feedback of body position. Pt able to complete total A 5 sit<>stands with min HHA +2 and OT stabilizing L LE to maintain position. Pt tolerated standing ~2 minutes per trial with seated rest breaks provided between trials. Pt required consistent verbal cues for midline orientation and weight shifting.   Pt was left resting in TISWC with call bell in reach, seatbelt alarm on, and all needs met.    Therapy Documentation Precautions:  Precautions Precautions: Fall, Other (comment) Precaution Comments: L hemiparesis, L visual field cut, L inattention, L hemisensory loss Restrictions Weight Bearing Restrictions Per Provider Order: No  Therapy/Group: Individual Therapy  Clide Deutscher 03/08/2023, 7:54 AM

## 2023-03-09 MED ORDER — GABAPENTIN 100 MG PO CAPS
200.0000 mg | ORAL_CAPSULE | Freq: Three times a day (TID) | ORAL | Status: DC
  Administered 2023-03-09 – 2023-03-15 (×18): 200 mg via ORAL
  Filled 2023-03-09 (×18): qty 2

## 2023-03-09 NOTE — Progress Notes (Signed)
Occupational Therapy Session Note  Patient Details  Name: Novi Calia MRN: [id number] Date of Birth: 1935-12-09  Today's Date: 03/09/2023 OT Individual Time: 8295-6213 OT Individual Time Calculation (min): 75 min  OT Individual Time: 1131-1200 OT Individual Time Calculation (min): 29 min   Short Term Goals: Week 3:  OT Short Term Goal 1 (Week 3): Pt will integrate LUE into functional tasks with Mod multimodal cuing. OT Short Term Goal 2 (Week 3): Pt will maintain sitting balance EOB/EOM in preparation for functional transfers with supervision OT Short Term Goal 3 (Week 3): Pt will complete toilet transfer with min A consistently OT Short Term Goal 4 (Week 3): Pt will complete toileting with mod A consistently  Skilled Therapeutic Interventions/Progress Updates:     Session 1: Pt received sitting up in [facility] requesting to take shower this AM. Pt presenting to be in good spirits receptive to skilled OT session reporting 0/10 pain, however reporting achy pain in L hemibody and discomfort in L toe- OT offering intermittent rest breaks, repositioning, and therapeutic support to optimize participation in therapy session. MD in/out during session- notified of Pt's pain. Transported Pt total A into bathroom in [facility] for energy conservation. Re-educated Pt on stand pivot transfer to TTB using grab bars with Pt able to complete with min A +step by step verbal cues for technique. Doffed pants from Pt's waist max A. Pt returned to sitting position and doffed shirt using hemi-technique with mod verbal cues. During shower, worked on L UE functional use, dynamic sitting balance, and sequencing. Pt able to utilize L UE at a gross assist level this session to wash R UE and underarm with mod verbal cues required to utilize compensatory technique of watching position of L UE d/t proprioceptive deficits. Pt able to maintain dynamic sitting balance throughout shower leaning anteriorly to wash lower B LEs and leaning  posteriorly to wash anterior peri-area. Pt stood with min A with B UE support on grab bars with min A and mod verbal cues for technique. Pt maintained standing balance with min A while OT assisted with washing buttocks. Min verbal cues required for sequencing and attention to each body part this session with Pt responding best to washing from head to toe to avoid skipping a body part. Pt with improved functional use of L UE and sitting balance this session, however she continues to require functional use of L UE. Stand pivot TTB>TISWC using grab bars mod A with max cues for technique. Transported Pt to bedroom for U/LB dressing. Pt recalled correct hemi-dressing technique, however required assistance with weaving L UE d/t proprioceptive deficits. Pt crossed legs to weave feet into pants with max verbal cues +increased time required to weave L foot and assistance provided to adjust pants over foot d/t fit of pants. Pt then weaved R LE into pants with min A provided for sitting balance. Pt stood with R UE supported on bed rail with min A and maintained standing balance min A while OT brought pants to waist. Pt fatigued at end of session, however receptive to staying up in wc. Pt was left resting in TISWC with call bell in reach, seatbelt alarm on, and all needs met.    Session 2:  Pt received in therapy gym, handed off to OT for session. Pt presenting to be in good spirits receptive to skilled OT session reporting 0/10 pain, however stating ache in L UE during movement- OT offering intermittent rest breaks, repositioning, and therapeutic support to optimize participation  in therapy session. Focused this session in L UE NMRE to improve functional use and attention to L UE positioning. Donned 2.5# wrist weight on Pt's L UE for increased proprioceptive feedback to increase awareness of L UE positioning. With L UE positioned on wash cloth and R UE positioned on top of L to provide HOH A, engaged Pt in completing  targeted reach activity- pushing washcloth towards specified colored cones sitting on table top with min verbal cues +increased time required for visual scanning to L to locate colored cones. Pt then completed B UE ball pass activity using weighted ball to work on  reciprocal arm movements and mirroring movements of L UE to R UE. Pt required max verbal cues to focus visual attention on L UE to compensate for visual deficits with min improvement and decreased carryover during functional tasks. Transported Pt back to room total A in TISWC. Pt was left resting in TISWC with call bell in reach, seatbelt alarm on, and all needs met.    Therapy Documentation Precautions:  Precautions Precautions: Fall, Other (comment) Precaution Comments: L hemiparesis, L visual field cut, L inattention, L hemisensory loss Restrictions Weight Bearing Restrictions Per Provider Order: No   Therapy/Group: Individual Therapy  Clide Deutscher 03/09/2023, 7:57 AM

## 2023-03-09 NOTE — Progress Notes (Signed)
PROGRESS NOTE   Subjective/Complaints:  Upgraded from D2 to D3 diet   ROS-denies chest pain or shortness of breath.  Denies abdominal pain, n/v/d.  poor sensation left side , L neglect, - no breathing issues   Objective:   DG Swallowing Func-Speech Pathology Result Date: 03/08/2023 Table formatting from the original result was not included. Modified Barium Swallow Study Patient Details Name: Kathleen Figueroa MRN: [id number] Date of Birth: 1935-02-13 Today's Date: 03/08/2023 HPI/PMH: HPI: Kathleen Figueroa is an 88 yo female presenting to ED 1/2 with L arm weakness and possible fall. MRI Brain shows large acute R PCA infarct. Previously seen by SLP 11/13/22 with occasional word-finding problems that persisted since prior CVA, but otherwise functional auditory comprehension and abstract language. PMH includes HTN, HLD, CAD, prior CVA. Patient has been tolerating Dys2/thin liquid diet clinically, however SLP recommended MBS to rule out silent aspiration and assess appropriateness for diet upgrade. Clinical Impression: Clinical Impression: Patient presents with trace to mild oral dysphagia and pharyngeal swallowing function that is grossly WFL. Orally, patient exhibits somewhat prolonged mastication with solids but eventually achieves complete recollection and only trace residue on the tongue/palate after the initial swallow. Pharyngeally, patient demonstrates adequate BOT retraction, hyolaryngeal elevation/excursion, epiglottic inversion, pharyngeal stripping wave, and clearance through the UES. No significant residue nor events of penetration/aspiration observed across consistencies. No esophageal retention or retrograde flow noted during brief, non-diagnostic esophageal sweep with pill/puree. Recommend upgrade to Dys3/thin liquids with full supervision to assist with feeding and cue for safe swallowing strategies. Recommend medications whole in puree per  patient preference. SLP will continue to follow to assess tolerance. Factors that may increase risk of adverse event in presence of aspiration Rubye Oaks & Clearance Coots 2021): Factors that may increase risk of adverse event in presence of aspiration Rubye Oaks & Clearance Coots 2021): Frail or deconditioned; Reduced cognitive function Recommendations/Plan: Swallowing Evaluation Recommendations Swallowing Evaluation Recommendations Recommendations: PO diet PO Diet Recommendation: Dysphagia 3 (Mechanical soft); Thin liquids (Level 0) Liquid Administration via: Cup; Straw Medication Administration: Whole meds with puree Supervision: Patient able to self-feed; Full supervision/cueing for swallowing strategies Swallowing strategies  : Minimize environmental distractions Postural changes: Position pt fully upright for meals Oral care recommendations: Oral care BID (2x/day) Treatment Plan Treatment Plan Treatment recommendations: Therapy as outlined in treatment plan below Follow-up recommendations: Skilled nursing-short term rehab (<3 hours/day) Recommendations Comment: n/a Functional status assessment: Patient has had a recent decline in their functional status and demonstrates the ability to make significant improvements in function in a reasonable and predictable amount of time. Treatment frequency: Min 2x/week Treatment duration: 2 weeks Interventions: Aspiration precaution training; Diet toleration management by SLP; Trials of upgraded texture/liquids; Patient/family education; Compensatory techniques Recommendations Recommendations for follow up therapy are one component of a multi-disciplinary discharge planning process, led by the attending physician.  Recommendations may be updated based on patient status, additional functional criteria and insurance authorization. Assessment: Orofacial Exam: Orofacial Exam Oral Cavity: Oral Hygiene: WFL Oral Cavity - Dentition: Dentures, top Orofacial Anatomy: WFL Oral Motor/Sensory Function:  Generalized oral weakness Anatomy: Anatomy: WFL Boluses Administered: Boluses Administered Boluses Administered: Thin liquids (Level 0); Mildly thick liquids (Level 2, nectar thick); Moderately thick liquids (Level  3, honey thick); Puree; Solid  Oral Impairment Domain: Oral Impairment Domain Lip Closure: No labial escape Tongue control during bolus hold: Cohesive bolus between tongue to palatal seal Bolus preparation/mastication: Timely and efficient chewing and mashing Bolus transport/lingual motion: Brisk tongue motion Oral residue: Trace residue lining oral structures Location of oral residue : Tongue; Palate Initiation of pharyngeal swallow : Pyriform sinuses  Pharyngeal Impairment Domain: Pharyngeal Impairment Domain Soft palate elevation: No bolus between soft palate (SP)/pharyngeal wall (PW) Laryngeal elevation: Complete superior movement of thyroid cartilage with complete approximation of arytenoids to epiglottic petiole Anterior hyoid excursion: Complete anterior movement Epiglottic movement: Complete inversion Laryngeal vestibule closure: Complete, no air/contrast in laryngeal vestibule Pharyngeal stripping wave : Present - complete Pharyngeal contraction (A/P view only): N/A Pharyngoesophageal segment opening: Complete distension and complete duration, no obstruction of flow Tongue base retraction: No contrast between tongue base and posterior pharyngeal wall (PPW) Pharyngeal residue: Complete pharyngeal clearance  Esophageal Impairment Domain: Esophageal Impairment Domain Esophageal clearance upright position: Complete clearance, esophageal coating Pill: Pill Consistency administered: Puree Puree: WFL Penetration/Aspiration Scale Score: Penetration/Aspiration Scale Score 1.  Material does not enter airway: Thin liquids (Level 0); Mildly thick liquids (Level 2, nectar thick); Moderately thick liquids (Level 3, honey thick); Puree; Solid; Pill Compensatory Strategies: Compensatory Strategies Compensatory  strategies: No   General Information: Caregiver present: No  Diet Prior to this Study: Dysphagia 2 (finely chopped); Thin liquids (Level 0)   Temperature : Normal   Respiratory Status: WFL   Supplemental O2: None (Room air)   History of Recent Intubation: No  Behavior/Cognition: Alert; Cooperative; Pleasant mood Self-Feeding Abilities: Able to self-feed Baseline vocal quality/speech: Normal Volitional Cough: Able to elicit Volitional Swallow: Able to elicit Exam Limitations: No limitations Goal Planning: Prognosis for improved oropharyngeal function: Good No data recorded No data recorded Patient/Family Stated Goal: none stated Consulted and agree with results and recommendations: Patient Pain: Pain Assessment Pain Assessment: No/denies pain End of Session: Start Time:No data recorded Stop Time: No data recorded Time Calculation:No data recorded Charges: No data recorded SLP visit diagnosis: SLP Visit Diagnosis: Dysphagia, oral phase (R13.11) Past Medical History: Past Medical History: Diagnosis Date  Acute CVA (cerebrovascular accident) (HCC) 01/13/2014  Acute renal failure (ARF) (HCC) 05/06/2018  Arthritis   Bilateral carotid artery occlusion 02/12/2014  Cerebral infarction (HCC) 02/12/2014  IMO SNOMED Dx Update Oct 2024    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)   CVA (cerebral vascular accident) (HCC) 11/12/2022  Diabetes mellitus type 2, uncontrolled, 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  H/O detached retina repair   Hypertension   TIA (transient ischemic attack)  Past Surgical History: Past Surgical History: Procedure Laterality Date  ENDARTERECTOMY Left 01/19/2014  Procedure: ENDARTERECTOMY CAROTID-LEFT;  Surgeon: Nada Libman, MD;  Location: [facility] OR;  Service: Vascular;  Laterality: Left;  EYE SURGERY    lens removed from right eye  PATCH ANGIOPLASTY Left 01/19/2014  Procedure: PATCH ANGIOPLASTY, LEFT CAROTID ARTERY USING HEMASHIELD PLATINUM FINESSE PATCH;  Surgeon: Nada Libman, MD;   Location: [facility] OR;  Service: Vascular;  Laterality: Left;  TONSILLECTOMY   Yetta Barre 03/08/2023, 12:14 PM   Recent Labs    03/08/23 0548  WBC 4.8  HGB 12.6  HCT 38.6  PLT 203     Recent Labs    03/08/23 0548  NA 135  K 4.2  CL 104  CO2 22  GLUCOSE 120*  BUN 27*  CREATININE 1.30*  CALCIUM 8.6*      Intake/Output  Summary (Last 24 hours) at 03/09/2023 0921 Last data filed at 03/09/2023 1610 Gross per 24 hour  Intake 868 ml  Output --  Net 868 ml         Physical Exam: Vital Signs Blood pressure 119/81, pulse 72, temperature 98 F (36.7 C), temperature source Oral, resp. rate 16, height 5\' 6"  (1.676 m), weight 64.2 kg, SpO2 100%.  General: No acute distress, resting in bed, comfortable Mood and affect are appropriate Heart: Regular rate and rhythm no rubs murmurs or extra sounds Lungs: Clear to auscultation, breathing unlabored, no rales or wheezes Abdomen: Positive bowel sounds, soft nontender to palpation, nondistended Extremities: No clubbing, cyanosis, or edema Skin: No evidence of breakdown, no evidence of rash over exposed surfaces Neuro: Speech with moderate dysarthria   PRIOR EXAMS: Musculoskeletal: limited Left knee flexion to 90 degrees . 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  Left hemineglect Absent sensation to light touch left upper and lower extremities Musculoskeletal: left knee does not flex beyond 90 deg, has pain at end range  No joint swelling No significant left arm tenderness or pain with PROM noted, neg impingement signs No edema   motor strength is 5/5 in right deltoid, bicep, tricep, grip, hip flexor, knee extensors, ankle dorsiflexor and plantar flexor Left side difficult to examine due to severe sensory ataxia but at least 4/5 Wandering LUE movements  Sensory exam absent sensation to light touch and proprioception in LEFT upper and lower extremities     Assessment/Plan: 1. Functional deficits which  require 3+ hours per day of interdisciplinary therapy in a comprehensive inpatient rehab setting. Physiatrist is providing close team supervision and 24 hour management of active medical problems listed below. Physiatrist and rehab team continue to assess barriers to discharge/monitor patient progress toward functional and medical goals  Care Tool:  Bathing    Body parts bathed by patient: Left arm, Chest, Abdomen, Front perineal area, Right upper leg, Left upper leg, Face   Body parts bathed by helper: Right arm, Buttocks, Left lower leg, Right lower leg     Bathing assist Assist Level: Moderate Assistance - Patient 50 - 74%     Upper Body Dressing/Undressing Upper body dressing   What is the patient wearing?: Pull over shirt    Upper body assist Assist Level: Moderate Assistance - Patient 50 - 74%    Lower Body Dressing/Undressing Lower body dressing      What is the patient wearing?: Pants, Underwear/pull up     Lower body assist Assist for lower body dressing: Maximal Assistance - Patient 25 - 49%     Toileting Toileting    Toileting assist Assist for toileting: Total Assistance - Patient < 25%     Transfers Chair/bed transfer  Transfers assist  Chair/bed transfer activity did not occur: Safety/medical concerns (unsafe to get up)  Chair/bed transfer assist level: Minimal Assistance - Patient > 75% (squat pivot)     Locomotion Ambulation   Ambulation assist   Ambulation activity did not occur: Safety/medical concerns (requires skilled assistance)  Assist level: 2 helpers Assistive device: Other (comment) (R hallway rail) Max distance: 25'   Walk 10 feet activity   Assist  Walk 10 feet activity did not occur: Safety/medical concerns  Assist level: 2 helpers Assistive device: Other (comment) (hand rail.)   Walk 50 feet activity   Assist Walk 50 feet with 2 turns activity did not occur: Safety/medical concerns  Assist level: 2 helpers  Walk 150 feet activity   Assist Walk 150 feet activity did not occur: Safety/medical concerns         Walk 10 feet on uneven surface  activity   Assist Walk 10 feet on uneven surfaces activity did not occur: Safety/medical concerns         Wheelchair     Assist Is the patient using a wheelchair?: Yes Type of Wheelchair: Manual    Wheelchair assist level: Total Assistance - Patient < 25% Max wheelchair distance: 52ft    Wheelchair 50 feet with 2 turns activity    Assist        Assist Level: Dependent - Patient 0%   Wheelchair 150 feet activity     Assist      Assist Level: Dependent - Patient 0%   Blood pressure 119/81, pulse 72, temperature 98 F (36.7 C), temperature source Oral, resp. rate 16, height 5\' 6"  (1.676 m), weight 64.2 kg, SpO2 100%.  Medical Problem List and Plan: 1. 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  Antithrombotics: -DVT/anticoagulation:  Pharmaceutical: lovenox 40mg  qd -antiplatelet therapy: Aspirin and Brilinta for three 90 days followed by aspirin alone   -hx left CEA 3. Pain Management: Tylenol as needed   -1/17 gabapentin 100 mg 3 times daily for neuropathic pain-- helping -Severe left knee OA with contracture , pain ok unless knee is flexed to >90deg  4. Mood/Behavior/Sleep: LCSW to evaluate and provide emotional support             -antipsychotic agents: n/a              - Melatonin PRN   5. Neuropsych/cognition: This patient is not quite capable of making  decisions on her own behalf.   6. Skin/Wound Care: Routine skin care checks   7. Fluids/Electrolytes/Nutrition: Routine Is and Os and follow-up chemistries   8: Hypertension: monitor TID and prn (Diovan 320 mg daily at home) On avapro 150mg  will increase to 300mg   1/17 fair control continue current regimen and monitor Restart amlodipine , low dose 2.5 mg, improved 1/24 -1/25-26/25 BP a bit variable, recent med change, monitor for now Vitals:   03/05/23 1440 03/05/23 1931 03/06/23 0536 03/06/23 1405  BP: 128/70 (!) 154/79 (!) 160/78 (!) 144/73   03/06/23 2004 03/07/23 0525 03/07/23 1419 03/07/23 2001  BP: (!) 172/76 (!) 156/67 136/67 138/67   03/08/23 0422 03/08/23 1344 03/08/23 1954 03/09/23 0420  BP: (!) 151/68 (!) 153/73 (!) 166/80 119/81  Increase amlodipine to 5mg  , lower this am , monitor  9: Hyperlipidemia: continue atorvastatin 80mg  daily   10: DM-2: A1c = 6.4% on October, 2024 (no home meds)  -1/16 glucose stable 127 on BMP 1/13  11: Elevated serum creatinine/GFR ~55: ? Mild CKD; near baseline ~1.0        -Continue to encourage and monitor fluid intake       Latest Ref Rng & Units 03/08/2023  5:48 AM 03/01/2023    5:08 AM 02/22/2023    5:00 AM  BMP  Glucose 70 - 99 mg/dL 295  284  132   BUN 8 - 23 mg/dL 27  26  22    Creatinine 0.44 - 1.00 mg/dL 4.40  1.02  7.25   Sodium 135 - 145 mmol/L 135  136  137   Potassium 3.5 - 5.1 mmol/L 4.2  4.3  3.8   Chloride 98 - 111 mmol/L 104  105  102   CO2 22 - 32 mmol/L 22  22  27    Calcium 8.9 - 10.3 mg/dL 8.6  8.9  8.8   Still with mildly elevated BUN/Creat enc fluids try  off ARB Enc fluids   12. Eye redness improved   -Artificial tears as needed ordered, continue to monitor  13. Constipation - Glycolax and senna  Type 5 large BM this am , continent LOS: 19 days A FACE TO FACE EVALUATION WAS PERFORMED  Erick Colace 03/09/2023, 9:21 AM

## 2023-03-09 NOTE — Progress Notes (Signed)
Patient ID: Kathleen Figueroa, female   DOB: 18-Jan-1936, 88 y.o.   MRN: [id number]  Informed son the speciality wheelchair evaluation is cancelled due to going to SNF from here. Asked if have spoken with pt regarding go to a SNF from here he and sister plan to discuss with pt tomorrow. They have decided upon a facility-[facility] will reach out to them to see if has available bed

## 2023-03-09 NOTE — Progress Notes (Signed)
Speech Language Pathology Daily Session Note  Patient Details  Name: Kathleen Figueroa MRN: [id number] Date of Birth: 26-Feb-1935  Today's Date: 03/09/2023 SLP Individual Time: 1300-1359 SLP Individual Time Calculation (min): 59 min  Short Term Goals: Week 4: SLP Short Term Goal 1 (Week 4): Patient will utilize swallowing compensatory strategies to reduce s/sx of aspiration during consumption of PO given supervision A SLP Short Term Goal 2 (Week 4): Patient will demonstrate problem solving in basic daily situations given supervision multimodal A SLP Short Term Goal 3 (Week 4): Patient will attend to L side during functional tasks given min multimodal A  Skilled Therapeutic Interventions: Skilled therapy session focused on cognitive goals. SLP faciliated session by providing modA during matching activity. SLP prompted patient to match items based on association. This activity targeted problem solving, organizational skills and L attention as patient had to locate items across table span. Patient benefited from colored anchor on L side. SLP continued to address cognitive goals through compare and contrast activity. Patient benefited from minA during this activity mainly due to visual deficits. Patient left in chair with alarm set and call bell in reach. Continue POC.  Pain None reported   Therapy/Group: Individual Therapy  Zondra Lawlor M.A., CF-SLP 03/09/2023, 7:42 AM

## 2023-03-09 NOTE — Progress Notes (Signed)
Physical Therapy Session Note  Patient Details  Name: Kathleen Figueroa MRN: [id number] Date of Birth: September 26, 1935  Today's Date: 03/09/2023 PT Individual Time: 1047-1130 *** PT Individual Time Calculation (min): 43 min   Short Term Goals: Week 3:  PT Short Term Goal 1 (Week 3): Pt will perform supine<>sit using hospital bed features as needed with CGA PT Short Term Goal 2 (Week 3): Pt will perform bed<>chair transfers with CGA and mod verbal cuing PT Short Term Goal 3 (Week 3): Pt will perform sit<>stands using LRAD (not hallway rail) with min A PT Short Term Goal 4 (Week 3): Pt will perform w/c propulsion at least 9ft with min A PT Short Term Goal 5 (Week 3): Pt will continue participating in gait training using LRAD with mod A and +2 assist as needed  Skilled Therapeutic Interventions/Progress Updates:    Pt received sitting in TIS w/c singing, happy, and smiling upon therapist arrival. Pt agreeable to therapy session. No longer planning to perform custom wheelchair evaluation today due to change in pt's D/C to SNF rather than D/Cing home per SW.  Therapist continues to encourage L visual scanning throughout session as pt continues to have strong R gaze preference.  Transported to/from gym in w/c for time management and energy conservation.  Sit>stand TIS w/c>R UE support on litegait handle with min A and +2 present for safety - upon coming to stand pt pulls heavily on litegait and accidentally bumps her forehead on the litegait machine, pt denies pain and no noticeable redness nor other sign of injury noted throughout session. In standing, with min A for balance due to R lean bias, donned litegait harness +2 total A for time and safety.  Gait training ~57ft + ~4ft using litegait harness with skilled mod assist for L LE management and weight shifting while +2 provided dependent assist to manage litegait. Pt demonstrating the following gait deviations with therapist providing the described  cuing and facilitation for improvement:  - started without BWS (body weight support) to attempt increased B LE WBing; however, pt with more impaired gait mechanics, L lateral lean with poor R weight shift onto R stance limb and progressively wider BOS; therefore, transitioned to providing min/mod BWS  - therapist continues to provide manual facilitation for improved L foot placement during swing to avoid excessive adduction to avoid risk of ankle inversion rolling  - after providing BWS pt with improved gait mechanics to achieve a more fluid, reciprocal stepping pattern, with decreased assist needed for L LE placement as pt places it with normal BOS; however, does have toe landing at initial contact  -- requires decreased assist to manually facilitate weight shifting onto stance limbs - therapist continues to provide verbal cuing for "right, left" stepping to help with motor plan and sequencing  Next time would trial gait training overground with +2 R HHA rather than utilizing litegait because concern pt may become dependent on the BWS; however, the BWS does help manage pt's L knee pain.  Doffed litegait harness as described above.   Sitting in TIS w/c initiated L UE NMR and L visual scanning task of moving round connect 4 circles from one end of the rainbow to another.  Pt left seated in TIS w/c as hand-off to OT to continue with therapy session.   Therapy Documentation Precautions:  Precautions Precautions: Fall, Other (comment) Precaution Comments: L hemiparesis, L visual field cut, L inattention, L hemisensory loss Restrictions Weight Bearing Restrictions Per Provider Order: No   Pain:  Continues to have L knee pain during sit<>stands if she doesn't lift it to avoid excessive knee flexion.     Therapy/Group: Individual Therapy  [name] , PT, DPT, NCS, CSRS 03/09/2023, 7:56 AM

## 2023-03-09 NOTE — Progress Notes (Signed)
PROGRESS NOTE   Subjective/Complaints:  Upgraded from D2 to D3 diet , c/o Left great toe pain this am, occurred during breakfast, no recent trauma, no hx gout  Also had Left shoulder pain relieved by K pad  ROS-denies chest pain or shortness of breath.  Denies abdominal pain, n/v/d.  poor sensation left side , L neglect, - no breathing issues   Objective:   DG Swallowing Func-Speech Pathology Result Date: 03/08/2023 Table formatting from the original result was not included. Modified Barium Swallow Study Patient Details Name: Kathleen Figueroa MRN: [id number] Date of Birth: Jun 16, 1935 Today's Date: 03/08/2023 HPI/PMH: HPI: Kathleen Figueroa is an 88 yo female presenting to ED 1/2 with L arm weakness and possible fall. MRI Brain shows large acute R PCA infarct. Previously seen by SLP 11/13/22 with occasional word-finding problems that persisted since prior CVA, but otherwise functional auditory comprehension and abstract language. PMH includes HTN, HLD, CAD, prior CVA. Patient has been tolerating Dys2/thin liquid diet clinically, however SLP recommended MBS to rule out silent aspiration and assess appropriateness for diet upgrade. Clinical Impression: Clinical Impression: Patient presents with trace to mild oral dysphagia and pharyngeal swallowing function that is grossly WFL. Orally, patient exhibits somewhat prolonged mastication with solids but eventually achieves complete recollection and only trace residue on the tongue/palate after the initial swallow. Pharyngeally, patient demonstrates adequate BOT retraction, hyolaryngeal elevation/excursion, epiglottic inversion, pharyngeal stripping wave, and clearance through the UES. No significant residue nor events of penetration/aspiration observed across consistencies. No esophageal retention or retrograde flow noted during brief, non-diagnostic esophageal sweep with pill/puree. Recommend upgrade to  Dys3/thin liquids with full supervision to assist with feeding and cue for safe swallowing strategies. Recommend medications whole in puree per patient preference. SLP will continue to follow to assess tolerance. Factors that may increase risk of adverse event in presence of aspiration Rubye Oaks & Clearance Coots 2021): Factors that may increase risk of adverse event in presence of aspiration Rubye Oaks & Clearance Coots 2021): Frail or deconditioned; Reduced cognitive function Recommendations/Plan: Swallowing Evaluation Recommendations Swallowing Evaluation Recommendations Recommendations: PO diet PO Diet Recommendation: Dysphagia 3 (Mechanical soft); Thin liquids (Level 0) Liquid Administration via: Cup; Straw Medication Administration: Whole meds with puree Supervision: Patient able to self-feed; Full supervision/cueing for swallowing strategies Swallowing strategies  : Minimize environmental distractions Postural changes: Position pt fully upright for meals Oral care recommendations: Oral care BID (2x/day) Treatment Plan Treatment Plan Treatment recommendations: Therapy as outlined in treatment plan below Follow-up recommendations: Skilled nursing-short term rehab (<3 hours/day) Recommendations Comment: n/a Functional status assessment: Patient has had a recent decline in their functional status and demonstrates the ability to make significant improvements in function in a reasonable and predictable amount of time. Treatment frequency: Min 2x/week Treatment duration: 2 weeks Interventions: Aspiration precaution training; Diet toleration management by SLP; Trials of upgraded texture/liquids; Patient/family education; Compensatory techniques Recommendations Recommendations for follow up therapy are one component of a multi-disciplinary discharge planning process, led by the attending physician.  Recommendations may be updated based on patient status, additional functional criteria and insurance authorization. Assessment: Orofacial  Exam: Orofacial Exam Oral Cavity: Oral Hygiene: WFL Oral Cavity - Dentition: Dentures, top Orofacial Anatomy: WFL Oral Motor/Sensory Function: Generalized  oral weakness Anatomy: Anatomy: WFL Boluses Administered: Boluses Administered Boluses Administered: Thin liquids (Level 0); Mildly thick liquids (Level 2, nectar thick); Moderately thick liquids (Level 3, honey thick); Puree; Solid  Oral Impairment Domain: Oral Impairment Domain Lip Closure: No labial escape Tongue control during bolus hold: Cohesive bolus between tongue to palatal seal Bolus preparation/mastication: Timely and efficient chewing and mashing Bolus transport/lingual motion: Brisk tongue motion Oral residue: Trace residue lining oral structures Location of oral residue : Tongue; Palate Initiation of pharyngeal swallow : Pyriform sinuses  Pharyngeal Impairment Domain: Pharyngeal Impairment Domain Soft palate elevation: No bolus between soft palate (SP)/pharyngeal wall (PW) Laryngeal elevation: Complete superior movement of thyroid cartilage with complete approximation of arytenoids to epiglottic petiole Anterior hyoid excursion: Complete anterior movement Epiglottic movement: Complete inversion Laryngeal vestibule closure: Complete, no air/contrast in laryngeal vestibule Pharyngeal stripping wave : Present - complete Pharyngeal contraction (A/P view only): N/A Pharyngoesophageal segment opening: Complete distension and complete duration, no obstruction of flow Tongue base retraction: No contrast between tongue base and posterior pharyngeal wall (PPW) Pharyngeal residue: Complete pharyngeal clearance  Esophageal Impairment Domain: Esophageal Impairment Domain Esophageal clearance upright position: Complete clearance, esophageal coating Pill: Pill Consistency administered: Puree Puree: WFL Penetration/Aspiration Scale Score: Penetration/Aspiration Scale Score 1.  Material does not enter airway: Thin liquids (Level 0); Mildly thick liquids (Level 2,  nectar thick); Moderately thick liquids (Level 3, honey thick); Puree; Solid; Pill Compensatory Strategies: Compensatory Strategies Compensatory strategies: No   General Information: Caregiver present: No  Diet Prior to this Study: Dysphagia 2 (finely chopped); Thin liquids (Level 0)   Temperature : Normal   Respiratory Status: WFL   Supplemental O2: None (Room air)   History of Recent Intubation: No  Behavior/Cognition: Alert; Cooperative; Pleasant mood Self-Feeding Abilities: Able to self-feed Baseline vocal quality/speech: Normal Volitional Cough: Able to elicit Volitional Swallow: Able to elicit Exam Limitations: No limitations Goal Planning: Prognosis for improved oropharyngeal function: Good No data recorded No data recorded Patient/Family Stated Goal: none stated Consulted and agree with results and recommendations: Patient Pain: Pain Assessment Pain Assessment: No/denies pain End of Session: Start Time:No data recorded Stop Time: No data recorded Time Calculation:No data recorded Charges: No data recorded SLP visit diagnosis: SLP Visit Diagnosis: Dysphagia, oral phase (R13.11) Past Medical History: Past Medical History: Diagnosis Date  Acute CVA (cerebrovascular accident) (HCC) 01/13/2014  Acute renal failure (ARF) (HCC) 05/06/2018  Arthritis   Bilateral carotid artery occlusion 02/12/2014  Cerebral infarction (HCC) 02/12/2014  IMO SNOMED Dx Update Oct 2024    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)   CVA (cerebral vascular accident) (HCC) 11/12/2022  Diabetes mellitus type 2, uncontrolled, 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  H/O detached retina repair   Hypertension   TIA (transient ischemic attack)  Past Surgical History: Past Surgical History: Procedure Laterality Date  ENDARTERECTOMY Left 01/19/2014  Procedure: ENDARTERECTOMY CAROTID-LEFT;  Surgeon: Nada Libman, MD;  Location: [facility] OR;  Service: Vascular;  Laterality: Left;  EYE SURGERY    lens removed from right eye  PATCH  ANGIOPLASTY Left 01/19/2014  Procedure: PATCH ANGIOPLASTY, LEFT CAROTID ARTERY USING HEMASHIELD PLATINUM FINESSE PATCH;  Surgeon: Nada Libman, MD;  Location: [facility] OR;  Service: Vascular;  Laterality: Left;  TONSILLECTOMY   Yetta Barre 03/08/2023, 12:14 PM   Recent Labs    03/08/23 0548  WBC 4.8  HGB 12.6  HCT 38.6  PLT 203     Recent Labs    03/08/23 0548  NA 135  K 4.2  CL 104  CO2 22  GLUCOSE 120*  BUN 27*  CREATININE 1.30*  CALCIUM 8.6*      Intake/Output Summary (Last 24 hours) at 03/09/2023 0950 Last data filed at 03/09/2023 0828 Gross per 24 hour  Intake 868 ml  Output --  Net 868 ml         Physical Exam: Vital Signs Blood pressure 119/81, pulse 72, temperature 98 F (36.7 C), temperature source Oral, resp. rate 16, height 5\' 6"  (1.676 m), weight 64.2 kg, SpO2 100%.  General: No acute distress, resting in bed, comfortable Mood and affect are appropriate Heart: Regular rate and rhythm no rubs murmurs or extra sounds Lungs: Clear to auscultation, breathing unlabored, no rales or wheezes Abdomen: Positive bowel sounds, soft nontender to palpation, nondistended Extremities: No clubbing, cyanosis, or edema Skin: No evidence of breakdown, no evidence of rash over exposed surfaces Neuro: Speech with moderate dysarthria   PRIOR EXAMS: Musculoskeletal: limited Left knee flexion to 90 degrees . 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  Left hemineglect Absent sensation to light touch left upper and lower extremities Musculoskeletal: left knee does not flex beyond 90 deg, has pain at end range  No joint swelling No significant left arm tenderness or pain with PROM noted, neg impingement signs No edema  Left great toe, small bunion, no erythema , swelling or tenderness no toe foot or ankle pain with ROM motor strength is 5/5 in right deltoid, bicep, tricep, grip, hip flexor, knee extensors, ankle dorsiflexor and plantar flexor Left  side difficult to examine due to severe sensory ataxia but at least 4/5 Wandering LUE movements  Sensory exam absent sensation to light touch and proprioception in LEFT upper and lower extremities     Assessment/Plan: 1. Functional deficits which require 3+ hours per day of interdisciplinary therapy in a comprehensive inpatient rehab setting. Physiatrist is providing close team supervision and 24 hour management of active medical problems listed below. Physiatrist and rehab team continue to assess barriers to discharge/monitor patient progress toward functional and medical goals  Care Tool:  Bathing    Body parts bathed by patient: Left arm, Chest, Abdomen, Front perineal area, Right upper leg, Left upper leg, Face   Body parts bathed by helper: Right arm, Buttocks, Left lower leg, Right lower leg     Bathing assist Assist Level: Moderate Assistance - Patient 50 - 74%     Upper Body Dressing/Undressing Upper body dressing   What is the patient wearing?: Pull over shirt    Upper body assist Assist Level: Moderate Assistance - Patient 50 - 74%    Lower Body Dressing/Undressing Lower body dressing      What is the patient wearing?: Pants, Underwear/pull up     Lower body assist Assist for lower body dressing: Maximal Assistance - Patient 25 - 49%     Toileting Toileting    Toileting assist Assist for toileting: Total Assistance - Patient < 25%     Transfers Chair/bed transfer  Transfers assist  Chair/bed transfer activity did not occur: Safety/medical concerns (unsafe to get up)  Chair/bed transfer assist level: Minimal Assistance - Patient > 75% (squat pivot)     Locomotion Ambulation   Ambulation assist   Ambulation activity did not occur: Safety/medical concerns (requires skilled assistance)  Assist level: 2 helpers Assistive device: Other (comment) (R hallway rail) Max distance: 25'   Walk 10 feet activity   Assist  Walk 10 feet activity did not  occur: Safety/medical concerns  Assist level: 2 helpers Assistive device: Other (comment) (hand rail.)   Walk 50 feet activity   Assist Walk 50 feet with 2 turns activity did not occur: Safety/medical concerns  Assist level: 2 helpers      Walk 150 feet activity   Assist Walk 150 feet activity did not occur: Safety/medical concerns         Walk 10 feet on uneven surface  activity   Assist Walk 10 feet on uneven surfaces activity did not occur: Safety/medical concerns         Wheelchair     Assist Is the patient using a wheelchair?: Yes Type of Wheelchair: Manual    Wheelchair assist level: Total Assistance - Patient < 25% Max wheelchair distance: 89ft    Wheelchair 50 feet with 2 turns activity    Assist        Assist Level: Dependent - Patient 0%   Wheelchair 150 feet activity     Assist      Assist Level: Dependent - Patient 0%   Blood pressure 119/81, pulse 72, temperature 98 F (36.7 C), temperature source Oral, resp. rate 16, height 5\' 6"  (1.676 m), weight 64.2 kg, SpO2 100%.  Medical Problem List and Plan: 1. 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  Antithrombotics: -DVT/anticoagulation:  Pharmaceutical: lovenox 40mg  qd -antiplatelet therapy: Aspirin and Brilinta for three 90 days followed by aspirin alone   -hx left CEA 3. Pain Management: Tylenol as needed   -1/17 gabapentin 100 mg 3 times daily for neuropathic pain-- helping -Severe left knee OA with contracture , pain ok  unless knee is flexed to >90deg  Neuropathic pain LLE and poss LUE will increase gabapentin, monitor for sedation  4. Mood/Behavior/Sleep: LCSW to evaluate and provide emotional support             -antipsychotic agents: n/a              - Melatonin PRN   5. Neuropsych/cognition: This patient is not quite capable of making decisions on her own behalf.   6. Skin/Wound Care: Routine skin care checks   7. Fluids/Electrolytes/Nutrition: Routine Is and Os and follow-up chemistries   8: Hypertension: monitor TID and prn (Diovan 320 mg daily at home) On avapro 150mg  will increase to 300mg   1/17 fair control continue current regimen and monitor Restart amlodipine , low dose 2.5 mg, improved 1/24 -1/25-26/25 BP a bit variable, recent med change, monitor for now Vitals:   03/05/23 1440 03/05/23 1931 03/06/23 0536 03/06/23 1405  BP: 128/70 (!) 154/79 (!) 160/78 (!) 144/73   03/06/23 2004 03/07/23 0525 03/07/23 1419 03/07/23 2001  BP: (!) 172/76 (!) 156/67 136/67 138/67   03/08/23 0422 03/08/23 1344 03/08/23 1954 03/09/23 0420  BP: (!) 151/68 (!) 153/73 (!) 166/80 119/81  Increase amlodipine to 5mg  , lower this am , monitor  9: Hyperlipidemia: continue atorvastatin 80mg  daily  10: DM-2: A1c = 6.4% on October, 2024 (no home meds)  -1/16 glucose stable 127 on BMP 1/13  11: Elevated serum creatinine/GFR ~55: ? Mild CKD; near baseline ~1.0        -Continue to encourage and monitor fluid intake       Latest Ref Rng & Units 03/08/2023    5:48 AM 03/01/2023    5:08 AM 02/22/2023    5:00 AM  BMP  Glucose 70 - 99 mg/dL 161  096  045   BUN 8 - 23 mg/dL 27  26  22    Creatinine 0.44 - 1.00 mg/dL 4.09  8.11  9.14   Sodium 135 - 145 mmol/L 135  136  137   Potassium 3.5 - 5.1 mmol/L 4.2  4.3  3.8   Chloride 98 - 111 mmol/L 104  105  102   CO2 22 - 32 mmol/L 22  22  27    Calcium 8.9 - 10.3 mg/dL 8.6  8.9  8.8   Still with mildly elevated BUN/Creat enc fluids try  off ARB Enc fluids   12. Eye  redness improved   -Artificial tears as needed ordered, continue to monitor  13. Constipation - Glycolax and senna  Type 5 large BM this am , continent LOS: 19 days A FACE TO FACE EVALUATION WAS PERFORMED  Erick Colace 03/09/2023, 9:50 AM

## 2023-03-10 LAB — GLUCOSE, CAPILLARY: Glucose-Capillary: 88 mg/dL (ref 70–99)

## 2023-03-10 MED ORDER — AMLODIPINE BESYLATE 10 MG PO TABS
10.0000 mg | ORAL_TABLET | Freq: Every day | ORAL | Status: DC
  Administered 2023-03-11 – 2023-03-15 (×5): 10 mg via ORAL
  Filled 2023-03-10 (×5): qty 1

## 2023-03-10 NOTE — Progress Notes (Signed)
Occupational Therapy Session Note  Patient Details  Name: Kathleen Figueroa MRN: [id number] Date of Birth: 1935-10-11  Today's Date: 03/10/2023 OT Individual Time: 1024-1115+ 4098-1191 OT Individual Time Calculation (min): 51 min    Short Term Goals: Week 3:  OT Short Term Goal 1 (Week 3): Pt will integrate LUE into functional tasks with Mod multimodal cuing. OT Short Term Goal 2 (Week 3): Pt will maintain sitting balance EOB/EOM in preparation for functional transfers with supervision OT Short Term Goal 3 (Week 3): Pt will complete toilet transfer with min A consistently OT Short Term Goal 4 (Week 3): Pt will complete toileting with mod A consistently  Skilled Therapeutic Interventions/Progress Updates:  Session 1: Pt greeted seated in w/c, pt agreeable to OT intervention.      Transfers/bed mobility/functional mobility: pt completed sit>stand in stedy with MIN A +2 with MAX A for hand placement. Pt completed squat pivots to R side to mat table with MIN A +2. MOD verbal cues needed for hand placement.   ADLs:    Transfers: pt completed sit>stand in stedy with MIN A +2 for toileting tasks.  Toileting: pt with incontinent urine void during session, MAX A for 3/3 toileting tasks to change brief.   NMR: pt completed various reaching tasks in standing from mat table with pt able to stand from mat table with MINA +2 with MAX cues for set- up and positioning. Pt then instructed to reach to mirror to L side to remove squigz from mirror with RUE. Pt needed MIN A +2 for standing balance. Graded task up and had pt step to target on floor with RLE and then reach with RUE to place squigz on mirror to specific target. Pt completed task with MODA +2 especially when completing targeted steps. Pt did have one episode of tone in LLE during stepping task.   Ended session with pt seated in w/c with all needs within reach and safety belt alarm activated.                    Session 2: Pt greeted seated in w/c,  pt agreeable to OT intervention.      Transfers/bed mobility/functional mobility: pt completed squat pivot transfers to R and L side with MINA. MOD multimodal cues to position transfer to R and L side.   NMR: pt completed various activities focused on weightbearing into LUE for neurmuscular reeducation: - pt given 2 lb dowel rod with BUEs positioned on bar with pt instructed to tap bar to R and L side to visual stimulus with an emphasis on active assist ROM for LUE and visual scanning to L side. Pt initially required hand over hand assist to initiate task with pt progressing to only supervision with MIN verbal cues for technique.  -positioned same 2 lb dowel rod horizontally with pt instructed to climb bar up<>down with BUEs with an emphasis on visual tracking and using RUE as visual tracker to promote improved motor planning in affected LUE.  -pt positioned in forward lean on bench from EOM with BUEs positioned on bench with pt leaning forward into weightbearing positioning to provide neuromuscular reeducation to LUE. Graded task up and had pt reach to floor level to retrieve cones and then position cones across midline to L side to place in w/c. Pt completed task with MIN A for set- up and technique, overall supervision needed for sitting balance. Pt additionally completed x10 modified push ups in this position to promote increased proprioception to LUE.  Ended session with pt reclined in TIS with all needs within reach and safety belt alarm activated.                    Therapy Documentation Precautions:  Precautions Precautions: Fall, Other (comment) Precaution Comments: L hemiparesis, L visual field cut, L inattention, L hemisensory loss Restrictions Weight Bearing Restrictions Per Provider Order: No   Pain: no pain reported during either session      Therapy/Group: Individual Therapy  Barron Schmid 03/10/2023, 12:16 PM

## 2023-03-10 NOTE — Progress Notes (Signed)
PROGRESS NOTE   Subjective/Complaints:  Upgraded from D2 to D3 diet , still with severe left neglect   ROS-denies chest pain or shortness of breath.  Denies abdominal pain, n/v/d.    Objective:   DG Swallowing Func-Speech Pathology Result Date: 03/08/2023 Table formatting from the original result was not included. Modified Barium Swallow Study Patient Details Name: Kathleen Figueroa MRN: [id number] Date of Birth: [birth year] Today's Date: 03/08/2023 HPI/PMH: HPI: Kathleen Figueroa is an 88 yo female presenting to ED 1/2 with L arm weakness and possible fall. MRI Brain shows large acute R PCA infarct. Previously seen by SLP 11/13/22 with occasional word-finding problems that persisted since prior CVA, but otherwise functional auditory comprehension and abstract language. PMH includes HTN, HLD, CAD, prior CVA. Patient has been tolerating Dys2/thin liquid diet clinically, however SLP recommended MBS to rule out silent aspiration and assess appropriateness for diet upgrade. Clinical Impression: Clinical Impression: Patient presents with trace to mild oral dysphagia and pharyngeal swallowing function that is grossly WFL. Orally, patient exhibits somewhat prolonged mastication with solids but eventually achieves complete recollection and only trace residue on the tongue/palate after the initial swallow. Pharyngeally, patient demonstrates adequate BOT retraction, hyolaryngeal elevation/excursion, epiglottic inversion, pharyngeal stripping wave, and clearance through the UES. No significant residue nor events of penetration/aspiration observed across consistencies. No esophageal retention or retrograde flow noted during brief, non-diagnostic esophageal sweep with pill/puree. Recommend upgrade to Dys3/thin liquids with full supervision to assist with feeding and cue for safe swallowing strategies. Recommend medications whole in puree per patient preference. SLP will  continue to follow to assess tolerance. Factors that may increase risk of adverse event in presence of aspiration Rubye Oaks & Clearance Coots 2021): Factors that may increase risk of adverse event in presence of aspiration Rubye Oaks & Clearance Coots 2021): Frail or deconditioned; Reduced cognitive function Recommendations/Plan: Swallowing Evaluation Recommendations Swallowing Evaluation Recommendations Recommendations: PO diet PO Diet Recommendation: Dysphagia 3 (Mechanical soft); Thin liquids (Level 0) Liquid Administration via: Cup; Straw Medication Administration: Whole meds with puree Supervision: Patient able to self-feed; Full supervision/cueing for swallowing strategies Swallowing strategies  : Minimize environmental distractions Postural changes: Position pt fully upright for meals Oral care recommendations: Oral care BID (2x/day) Treatment Plan Treatment Plan Treatment recommendations: Therapy as outlined in treatment plan below Follow-up recommendations: Skilled nursing-short term rehab (<3 hours/day) Recommendations Comment: n/a Functional status assessment: Patient has had a recent decline in their functional status and demonstrates the ability to make significant improvements in function in a reasonable and predictable amount of time. Treatment frequency: Min 2x/week Treatment duration: 2 weeks Interventions: Aspiration precaution training; Diet toleration management by SLP; Trials of upgraded texture/liquids; Patient/family education; Compensatory techniques Recommendations Recommendations for follow up therapy are one component of a multi-disciplinary discharge planning process, led by the attending physician.  Recommendations may be updated based on patient status, additional functional criteria and insurance authorization. Assessment: Orofacial Exam: Orofacial Exam Oral Cavity: Oral Hygiene: WFL Oral Cavity - Dentition: Dentures, top Orofacial Anatomy: WFL Oral Motor/Sensory Function: Generalized oral weakness  Anatomy: Anatomy: WFL Boluses Administered: Boluses Administered Boluses Administered: Thin liquids (Level 0); Mildly thick liquids (Level 2, nectar thick); Moderately thick liquids (Level 3, honey thick); Puree; Solid  Oral Impairment Domain: Oral Impairment Domain Lip Closure: No labial escape Tongue control during bolus hold: Cohesive bolus between tongue to palatal seal Bolus preparation/mastication: Timely and efficient chewing and mashing Bolus transport/lingual motion: Brisk tongue motion Oral residue: Trace residue lining oral structures Location of oral residue : Tongue; Palate Initiation of pharyngeal swallow : Pyriform sinuses  Pharyngeal Impairment Domain: Pharyngeal Impairment Domain Soft palate elevation: No bolus between soft palate (SP)/pharyngeal wall (PW) Laryngeal elevation: Complete superior movement of thyroid cartilage with complete approximation of arytenoids to epiglottic petiole Anterior hyoid excursion: Complete anterior movement Epiglottic movement: Complete inversion Laryngeal vestibule closure: Complete, no air/contrast in laryngeal vestibule Pharyngeal stripping wave : Present - complete Pharyngeal contraction (A/P view only): N/A Pharyngoesophageal segment opening: Complete distension and complete duration, no obstruction of flow Tongue base retraction: No contrast between tongue base and posterior pharyngeal wall (PPW) Pharyngeal residue: Complete pharyngeal clearance  Esophageal Impairment Domain: Esophageal Impairment Domain Esophageal clearance upright position: Complete clearance, esophageal coating Pill: Pill Consistency administered: Puree Puree: WFL Penetration/Aspiration Scale Score: Penetration/Aspiration Scale Score 1.  Material does not enter airway: Thin liquids (Level 0); Mildly thick liquids (Level 2, nectar thick); Moderately thick liquids (Level 3, honey thick); Puree; Solid; Pill Compensatory Strategies: Compensatory Strategies Compensatory strategies: No   General  Information: Caregiver present: No  Diet Prior to this Study: Dysphagia 2 (finely chopped); Thin liquids (Level 0)   Temperature : Normal   Respiratory Status: WFL   Supplemental O2: None (Room air)   History of Recent Intubation: No  Behavior/Cognition: Alert; Cooperative; Pleasant mood Self-Feeding Abilities: Able to self-feed Baseline vocal quality/speech: Normal Volitional Cough: Able to elicit Volitional Swallow: Able to elicit Exam Limitations: No limitations Goal Planning: Prognosis for improved oropharyngeal function: Good No data recorded No data recorded Patient/Family Stated Goal: none stated Consulted and agree with results and recommendations: Patient Pain: Pain Assessment Pain Assessment: No/denies pain End of Session: Start Time:No data recorded Stop Time: No data recorded Time Calculation:No data recorded Charges: No data recorded SLP visit diagnosis: SLP Visit Diagnosis: Dysphagia, oral phase (R13.11) Past Medical History: Past Medical History: Diagnosis Date  Acute CVA (cerebrovascular accident) (HCC) 01/13/2014  Acute renal failure (ARF) (HCC) 05/06/2018  Arthritis   Bilateral carotid artery occlusion 02/12/2014  Cerebral infarction (HCC) 02/12/2014  IMO SNOMED Dx Update Oct 2024    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)   CVA (cerebral vascular accident) (HCC) 11/12/2022  Diabetes mellitus type 2, uncontrolled, 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  H/O detached retina repair   Hypertension   TIA (transient ischemic attack)  Past Surgical History: Past Surgical History: Procedure Laterality Date  ENDARTERECTOMY Left 01/19/2014  Procedure: ENDARTERECTOMY CAROTID-LEFT;  Surgeon: Nada Libman, MD;  Location: [facility] OR;  Service: Vascular;  Laterality: Left;  EYE SURGERY    lens removed from right eye  PATCH ANGIOPLASTY Left 01/19/2014  Procedure: PATCH ANGIOPLASTY, LEFT CAROTID ARTERY USING HEMASHIELD PLATINUM FINESSE PATCH;  Surgeon: Nada Libman, MD;  Location: [facility] OR;  Service:  Vascular;  Laterality: Left;  TONSILLECTOMY   Yetta Barre 03/08/2023, 12:14 PM   Recent Labs    03/08/23 0548  WBC 4.8  HGB 12.6  HCT 38.6  PLT 203     Recent Labs    03/08/23 0548  NA 135  K 4.2  CL 104  CO2 22  GLUCOSE 120*  BUN 27*  CREATININE 1.30*  CALCIUM 8.6*      Intake/Output Summary (Last 24 hours) at 03/10/2023  1610 Last data filed at 03/10/2023 0750 Gross per 24 hour  Intake 838 ml  Output --  Net 838 ml         Physical Exam: Vital Signs Blood pressure (!) 146/66, pulse 64, temperature 97.6 F (36.4 C), resp. rate 16, height 5\' 6"  (1.676 m), weight 64.2 kg, SpO2 98%.  General: No acute distress, resting in bed, comfortable Mood and affect are appropriate Heart: Regular rate and rhythm no rubs murmurs or extra sounds Lungs: Clear to auscultation, breathing unlabored, no rales or wheezes Abdomen: Positive bowel sounds, soft nontender to palpation, nondistended Extremities: No clubbing, cyanosis, or edema Skin: No evidence of breakdown, no evidence of rash over exposed surfaces Neuro: Speech with moderate dysarthria    Musculoskeletal: limited Left knee flexion to 90 degrees . 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  Left hemineglect Absent sensation to light touch left upper and lower extremities Musculoskeletal: left knee does not flex beyond 90 deg, has pain at end range  No joint swelling No significant left arm tenderness or pain with PROM noted, neg impingement signs No edema   motor strength is 5/5 in right deltoid, bicep, tricep, grip, hip flexor, knee extensors, ankle dorsiflexor and plantar flexor Left side difficult to examine due to severe sensory ataxia but at least 4/5 Wandering LUE movements  Sensory exam absent sensation to light touch and proprioception in LEFT upper and lower extremities  Able to touch my finger with left hand twice this am    Assessment/Plan: 1. Functional deficits which require  3+ hours per day of interdisciplinary therapy in a comprehensive inpatient rehab setting. Physiatrist is providing close team supervision and 24 hour management of active medical problems listed below. Physiatrist and rehab team continue to assess barriers to discharge/monitor patient progress toward functional and medical goals  Care Tool:  Bathing    Body parts bathed by patient: Left arm, Chest, Abdomen, Front perineal area, Right upper leg, Left upper leg, Face   Body parts bathed by helper: Right arm, Buttocks, Left lower leg, Right lower leg     Bathing assist Assist Level: Moderate Assistance - Patient 50 - 74%     Upper Body Dressing/Undressing Upper body dressing   What is the patient wearing?: Pull over shirt    Upper body assist Assist Level: Moderate Assistance - Patient 50 - 74%    Lower Body Dressing/Undressing Lower body dressing      What is the patient wearing?: Pants, Underwear/pull up     Lower body assist Assist for lower body dressing: Maximal Assistance - Patient 25 - 49%     Toileting Toileting    Toileting assist Assist for toileting: Total Assistance - Patient < 25%     Transfers Chair/bed transfer  Transfers assist  Chair/bed transfer activity did not occur: Safety/medical concerns (unsafe to get up)  Chair/bed transfer assist level: Minimal Assistance - Patient > 75% (squat pivot)     Locomotion Ambulation   Ambulation assist   Ambulation activity did not occur: Safety/medical concerns (requires skilled assistance)  Assist level: 2 helpers Assistive device: Other (comment) (R hallway rail) Max distance: 25'   Walk 10 feet activity   Assist  Walk 10 feet activity did not occur: Safety/medical concerns  Assist level: 2 helpers Assistive device: Other (comment) (hand rail.)   Walk 50 feet activity   Assist Walk 50 feet with 2 turns activity did not occur: Safety/medical concerns  Assist level: 2 helpers  Walk 150  feet activity   Assist Walk 150 feet activity did not occur: Safety/medical concerns         Walk 10 feet on uneven surface  activity   Assist Walk 10 feet on uneven surfaces activity did not occur: Safety/medical concerns         Wheelchair     Assist Is the patient using a wheelchair?: Yes Type of Wheelchair: Manual    Wheelchair assist level: Total Assistance - Patient < 25% Max wheelchair distance: 3ft    Wheelchair 50 feet with 2 turns activity    Assist        Assist Level: Dependent - Patient 0%   Wheelchair 150 feet activity     Assist      Assist Level: Dependent - Patient 0%   Blood pressure (!) 146/66, pulse 64, temperature 97.6 F (36.4 C), resp. rate 16, height 5\' 6"  (1.676 m), weight 64.2 kg, SpO2 98%.  Medical Problem List and Plan: 1. 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  Antithrombotics: -DVT/anticoagulation:  Pharmaceutical: lovenox 40mg  qd -antiplatelet therapy: Aspirin and Brilinta for three 90 days followed by aspirin alone   -hx left CEA 3. Pain Management: Tylenol as needed   -1/17 gabapentin 100 mg 3 times daily for neuropathic pain-- helping -Severe left knee OA with contracture , pain ok unless knee is flexed to >90deg  4. Mood/Behavior/Sleep: LCSW to evaluate and provide emotional support             -antipsychotic agents: n/a              - Melatonin PRN   5. Neuropsych/cognition: This patient is not quite capable of making decisions on her own behalf.   6.  Skin/Wound Care: Routine skin care checks   7. Fluids/Electrolytes/Nutrition: Routine Is and Os and follow-up chemistries   8: Hypertension: monitor TID and prn (Diovan 320 mg daily at home) On avapro 150mg  will increase to 300mg   1/17 fair control continue current regimen and monitor Restart amlodipine , low dose 2.5 mg, improved 1/24 -1/25-26/25 BP a bit variable, recent med change, monitor for now Vitals:   03/06/23 1405 03/06/23 2004 03/07/23 0525 03/07/23 1419  BP: (!) 144/73 (!) 172/76 (!) 156/67 136/67   03/07/23 2001 03/08/23 0422 03/08/23 1344 03/08/23 1954  BP: 138/67 (!) 151/68 (!) 153/73 (!) 166/80   03/09/23 0420 03/09/23 1428 03/09/23 2208 03/10/23 0533  BP: 119/81 (!) 165/69 (!) 155/80 (!) 146/66  Increase amlodipine to 10mg  ,  9: Hyperlipidemia: continue atorvastatin 80mg  daily   10: DM-2: A1c = 6.4% on October, 2024 (no home meds)  -1/16 glucose stable 127 on BMP 1/13  11: Elevated serum creatinine/GFR ~55: ? Mild CKD; near baseline ~1.0        -Continue to encourage and monitor fluid intake       Latest Ref Rng & Units 03/08/2023    5:48 AM  03/01/2023    5:08 AM 02/22/2023    5:00 AM  BMP  Glucose 70 - 99 mg/dL 409  811  914   BUN 8 - 23 mg/dL 27  26  22    Creatinine 0.44 - 1.00 mg/dL 7.82  9.56  2.13   Sodium 135 - 145 mmol/L 135  136  137   Potassium 3.5 - 5.1 mmol/L 4.2  4.3  3.8   Chloride 98 - 111 mmol/L 104  105  102   CO2 22 - 32 mmol/L 22  22  27    Calcium 8.9 - 10.3 mg/dL 8.6  8.9  8.8   Still with mildly elevated BUN/Creat enc fluids try  off ARB Enc fluids  Recheck in am  12. Eye redness improved   -Artificial tears as needed ordered, continue to monitor  13. Constipation - Glycolax and senna  Type 5 large BM this am , continent LOS: 20 days A FACE TO FACE EVALUATION WAS PERFORMED  Erick Colace 03/10/2023, 9:27 AM

## 2023-03-10 NOTE — Patient Care Conference (Signed)
Inpatient RehabilitationTeam Conference and Plan of Care Update Date: 03/10/2023   Time: 10:34 AM   Patient Name: Kathleen Figueroa      Medical Record Number: [id number]  Date of Birth: [birth year] Sex: Female         Room/Bed: 4W03C/4W03C-01 Payor Info: Payor: [employer] MEDICARE / Plan: [employer] MEDICARE HMO / Product Type: *No Product type* /    Admit Date/Time:  02/18/2023  4:29 PM  Primary Diagnosis:  <principal problem not specified>  Hospital Problems: Active Problems:   CVA (cerebral vascular accident) (HCC)   Stage 2 chronic kidney disease   Diabetes mellitus (HCC)   Primary hypertension    Expected Discharge Date: Expected Discharge Date:  (pending SNF)  Team Members Present: Physician leading conference: Dr. Claudette Laws Social Worker Present: Dossie Der, LCSW Nurse Present: Chana Bode, RN;Angelina Leonar, RN;Remas Sobel Marjo Bicker, RN PT Present: Casimiro Needle, PT OT Present: Mariann Barter, OT SLP Present: Everardo Pacific, SLP PPS Coordinator present : Fae Pippin, SLP     Current Status/Progress Goal Weekly Team Focus  Bowel/Bladder   Patient is incontinent and has urgency  *** To maintain skin integrity   Adhering to bowel program and timed toileting    Swallow/Nutrition/ Hydration   D3/thin  *** supervision  MBS completed, use of strategies    ADL's   mod A UB dressing (max verbal cues), mod LB dressing, min U/LB bathing seated on TTB with max verbal cues, skilled min squat pivot transfers; Barriers- L hemibody attention/proprioceptive deficits, midline orientation, trunk control, compensatory techniques  *** min A overall BADLs   L side attention, BADL retraining, L hemi-body attention/awareness, dynamic sitting balance, functional transfer training, sit<>stands, L hemibody NMRE    Mobility   CGA to supervision supine<>sit using bed features with verbal cuing, skilled min A squat pivot transfers with mod step-by-step cuing for sequencing and positioning,  min A sit<>stands using R UE support on stable surface (hallway rail or steady bar) but up to light mod A for sit<>stands without that support, continuing gait training at R hallway rail vs using litegait harness with skilled mod A for L LE management and balance with +2 w/c follow; canceled w/c eval due to change in D/C plan to SNF  -- continues to have greatest deficits of severe L hemibody sensory deficits, visual acuity, perceptual, and motor deficits, L inattention, and overall impaired awareness of deficits and impact on her independence with mobility; however, this is improving  *** CGA transfers, min A gait, and supervision w/c mobility -- will need to downgrade based on change in D/C plan  pt awarenss, L attention, visual scanning and attention, transfer training, wheelchair management, L hemibody NMR, gait training, motor planning and sequencing -- D/C plan changed to SNF    Communication      ***          Safety/Cognition/ Behavioral Observations  mn-modA  *** supervision   attention to L side, memory strategies, problem solving    Pain   Pain management is maintained and can verbilize the pain  *** To have well managed pain   Ensure using the numerical pain scale, using comfort measures (heat, repositioning, music), appropriate medication for pain rating    Skin   Skin is intact without any tears or signs of skin breakdown  *** To keep skin dry  Continue nutritional intake of protein and timed toileting schedule      Discharge Planning:  Working on SNF bed for pt, fmaily has preferences and have  given to worker. Pt continues to make slow progress   Team Discussion: *** Patient on target to meet rehab goals: {IP REHAB YES/NO WITH [id number]:40981}  *See Care Plan and progress notes for long and short-term goals.   Revisions to Treatment Plan:  ***  Teaching Needs: ***  Current Barriers to Discharge: {BARRIERS TO [id number]}  Possible Resolutions to  Barriers: ***     Medical Summary Current Status: sensory ataxia mildly improved, still with severe L neglect adn flield cut, BP uncontrolled but am readings are improving , diet upgraded to D3 thin liq  Barriers to Discharge: Medical stability;Other (comments)  Barriers to Discharge Comments: severe left visuoperceptual neglect Possible Resolutions to Barriers/Weekly Focus: BP med adjustment   Continued Need for Acute Rehabilitation Level of Care: The patient requires daily medical management by a physician with specialized training in physical medicine and rehabilitation for the following reasons: Direction of a multidisciplinary physical rehabilitation program to maximize functional independence : Yes Medical management of patient stability for increased activity during participation in an intensive rehabilitation regime.: Yes Analysis of laboratory values and/or radiology reports with any subsequent need for medication adjustment and/or medical intervention. : Yes   I attest that I was present, lead the team conference, and concur with the assessment and plan of the team.   Jearld Adjutant 03/10/2023, 4:17 PM

## 2023-03-10 NOTE — Progress Notes (Signed)
Speech Language Pathology Daily Session Note  Patient Details  Name: Sheela Mcculley MRN: [id number] Date of Birth: [birth year]  Today's Date: 03/10/2023 SLP Individual Time: 1432-1530 SLP Individual Time Calculation (min): 58 min  Short Term Goals: Week 4: SLP Short Term Goal 1 (Week 4): Patient will utilize swallowing compensatory strategies to reduce s/sx of aspiration during consumption of PO given supervision A SLP Short Term Goal 2 (Week 4): Patient will demonstrate problem solving in basic daily situations given supervision multimodal A SLP Short Term Goal 3 (Week 4): Patient will attend to L side during functional tasks given min multimodal A  Skilled Therapeutic Interventions: Skilled therapy session focused on L attention goals. SLP facilitated session by providing supervision A to review activities completed in prior ST sessions. Patient recalled association activity completed yesterday as well as recalled 3/4 memory strategies (4/4 given minA). SLP targeted L attention through number and month sequencing task. Patient sequenced items located across table span with minA and use of color anchor. Patient left in chair with alarm set and call bell in reach. Continue POC.   Pain None reported   Therapy/Group: Individual Therapy  Preethi Scantlebury M.A., CF-SLP 03/10/2023, 7:47 AM

## 2023-03-10 NOTE — Progress Notes (Signed)
Physical Therapy Session Note  Patient Details  Name: Kathleen Figueroa MRN: [id number] Date of Birth: 1935-08-28  Today's Date: 03/10/2023 PT Individual Time: 1191-4782 PT Individual Time Calculation (min): 41 min   Short Term Goals: Week 3:  PT Short Term Goal 1 (Week 3): Pt will perform supine<>sit using hospital bed features as needed with CGA PT Short Term Goal 2 (Week 3): Pt will perform bed<>chair transfers with CGA and mod verbal cuing PT Short Term Goal 3 (Week 3): Pt will perform sit<>stands using LRAD (not hallway rail) with min A PT Short Term Goal 4 (Week 3): Pt will perform w/c propulsion at least 74ft with min A PT Short Term Goal 5 (Week 3): Pt will continue participating in gait training using LRAD with mod A and +2 assist as needed  Skilled Therapeutic Interventions/Progress Updates:    Pt received supine in bed happily singing and agreeable to therapy session. Supine>sitting L EOB, HOB elevated but not needing bedrails, with supervision and only verbal cuing for improved L LE management!! Sitting EOB with supervision to CGA primarily and 1x min A due to L anterior LOB while trying to thread pants on LEs due to L LE sliding too far to the R causing lack of support from her BOS. Threaded on pants with mod/max A for L side and max cuing for technique. Reviewed how to ensure L LE is in the proper position (back underneath her BOS) because often she has it too close to her R foot or too far out in front of her (pt does have knee flexion ROM deficits due to OA impacting her ability to bring it completley back underneath her BOS). Sit>stand EOB>no UE support with skilled light mod/heavy min A for lifting and balance - pt with improved midline orientation while standing - able to pull pants up over hips with mod A for L side once therapist orients pt to waistband of pants. R partial stand pivot EOB>TIS w/c with skilled heavy min assist.   Transported in/out bathroom in TIS w/c.  R stand  pivot TIS w/c>BSC over toilet using R UE support on grab bar and step-by-step cuing for sequencing LE stepping with skilled mod A for balance and dependent LB clothing management. Pt started to be incontinent of bowels and bladder - further continent on toilet. Pt left seated on BSC over toilet with NT present to assume care of patient.    Therapy Documentation Precautions:  Precautions Precautions: Fall, Other (comment) Precaution Comments: L hemiparesis, L visual field cut, L inattention, L hemisensory loss Restrictions Weight Bearing Restrictions Per Provider Order: No   Pain:  Continues to have L knee/thigh "ache" when attempting to flex knee too far - cuing to avoid this during session.   Therapy/Group: Individual Therapy  [name] , PT, DPT, NCS, CSRS 03/10/2023, 7:46 AM

## 2023-03-11 LAB — BASIC METABOLIC PANEL
Anion gap: 9 (ref 5–15)
BUN: 22 mg/dL (ref 8–23)
CO2: 25 mmol/L (ref 22–32)
Calcium: 8.6 mg/dL — ABNORMAL LOW (ref 8.9–10.3)
Chloride: 105 mmol/L (ref 98–111)
Creatinine, Ser: 1.07 mg/dL — ABNORMAL HIGH (ref 0.44–1.00)
GFR, Estimated: 51 mL/min — ABNORMAL LOW (ref >60)
Glucose, Bld: 109 mg/dL — ABNORMAL HIGH (ref 70–99)
Potassium: 4.2 mmol/L (ref 3.5–5.1)
Sodium: 139 mmol/L (ref 135–145)

## 2023-03-11 NOTE — Progress Notes (Signed)
PROGRESS NOTE   Subjective/Complaints:  Patient went to the bathroom with physical therapy assistance this morning Complaints of right toe pain no recent trauma.  ROS-denies chest pain or shortness of breath.  Denies abdominal pain, n/v/d.    Objective:   No results found.   No results for input(s): "WBC", "HGB", "HCT", "PLT" in the last 72 hours.    Recent Labs    03/11/23 0537  NA 139  K 4.2  CL 105  CO2 25  GLUCOSE 109*  BUN 22  CREATININE 1.07*  CALCIUM 8.6*      Intake/Output Summary (Last 24 hours) at 03/11/2023 1251 Last data filed at 03/11/2023 1231 Gross per 24 hour  Intake 712 ml  Output 150 ml  Net 562 ml         Physical Exam: Vital Signs Blood pressure 136/78, pulse 75, temperature 98 F (36.7 C), resp. rate 16, height 5\' 6"  (1.676 m), weight 64.2 kg, SpO2 96%.  General: No acute distress, resting in bed, comfortable Mood and affect are appropriate Heart: Regular rate and rhythm no rubs murmurs or extra sounds Lungs: Clear to auscultation, breathing unlabored, no rales or wheezes Abdomen: Positive bowel sounds, soft nontender to palpation, nondistended Extremities: No clubbing, cyanosis, or edema Skin: No evidence of breakdown, no evidence of rash over exposed surfaces Neuro: Speech with moderate dysarthria    Musculoskeletal: limited Left knee flexion to 90 degrees . 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  Left hemineglect Absent sensation to light touch left upper and lower extremities Musculoskeletal: Right great toe onychogryphosis, no tenderness around the nailbed no swelling at the toe or at the MTP no erythema or drainage.  motor strength is 5/5 in right deltoid, bicep, tricep, grip, hip flexor, knee extensors, ankle dorsiflexor and plantar flexor Left side difficult to examine due to severe sensory ataxia but at least 4/5 Wandering LUE movements   Sensory exam absent sensation to light touch and proprioception in LEFT upper and lower extremities  Able to touch my finger with left hand twice this am    Assessment/Plan: 1. Functional deficits which require 3+ hours per day of interdisciplinary therapy in a comprehensive inpatient rehab setting. Physiatrist is providing close team supervision and 24 hour management of active medical problems listed below. Physiatrist and rehab team continue to assess barriers to discharge/monitor patient progress toward functional and medical goals  Care Tool:  Bathing    Body parts bathed by patient: Left arm, Chest, Abdomen, Front perineal area, Right upper leg, Left upper leg, Face   Body parts bathed by helper: Right arm, Buttocks, Left lower leg, Right lower leg     Bathing assist Assist Level: Moderate Assistance - Patient 50 - 74%     Upper Body Dressing/Undressing Upper body dressing   What is the patient wearing?: Pull over shirt    Upper body assist Assist Level: Moderate Assistance - Patient 50 - 74%    Lower Body Dressing/Undressing Lower body dressing      What is the patient wearing?: Pants, Underwear/pull up     Lower body assist Assist for lower body dressing: Maximal Assistance - Patient 25 -  49%     Toileting Toileting    Toileting assist Assist for toileting: Total Assistance - Patient < 25%     Transfers Chair/bed transfer  Transfers assist  Chair/bed transfer activity did not occur: Safety/medical concerns (unsafe to get up)  Chair/bed transfer assist level: Minimal Assistance - Patient > 75% (squat pivot)     Locomotion Ambulation   Ambulation assist   Ambulation activity did not occur: Safety/medical concerns (requires skilled assistance)  Assist level: 2 helpers Assistive device: Other (comment) (R hallway rail) Max distance: 25'   Walk 10 feet activity   Assist  Walk 10 feet activity did not occur: Safety/medical concerns  Assist  level: 2 helpers Assistive device: Other (comment) (hand rail.)   Walk 50 feet activity   Assist Walk 50 feet with 2 turns activity did not occur: Safety/medical concerns  Assist level: 2 helpers      Walk 150 feet activity   Assist Walk 150 feet activity did not occur: Safety/medical concerns         Walk 10 feet on uneven surface  activity   Assist Walk 10 feet on uneven surfaces activity did not occur: Safety/medical concerns         Wheelchair     Assist Is the patient using a wheelchair?: Yes Type of Wheelchair: Manual    Wheelchair assist level: Total Assistance - Patient < 25% Max wheelchair distance: 32ft    Wheelchair 50 feet with 2 turns activity    Assist        Assist Level: Dependent - Patient 0%   Wheelchair 150 feet activity     Assist      Assist Level: Dependent - Patient 0%   Blood pressure 136/78, pulse 75, temperature 98 F (36.7 C), resp. rate 16, height 5\' 6"  (1.676 m), weight 64.2 kg, SpO2 96%.  Medical Problem List and Plan: 1. 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  Antithrombotics: -DVT/anticoagulation:  Pharmaceutical: lovenox 40mg  qd -antiplatelet therapy: Aspirin and Brilinta for three 90 days followed by aspirin alone   -hx left CEA 3. Pain Management: Tylenol as needed   -1/17 gabapentin 100 mg 3 times daily for neuropathic pain-- helping -Severe left knee OA with contracture , pain ok unless knee is flexed to >90deg  4. Mood/Behavior/Sleep: LCSW to evaluate and provide emotional support             -antipsychotic agents: n/a              - Melatonin PRN   5. Neuropsych/cognition: This patient is not quite capable of making decisions on her own behalf.   6. Skin/Wound Care: Routine skin care checks    7. Fluids/Electrolytes/Nutrition: Routine Is and Os and follow-up chemistries   8: Hypertension: monitor TID and prn (Diovan 320 mg daily at home) On avapro 150mg  will increase to 300mg   1/17 fair control continue current regimen and monitor Restart amlodipine , low dose 2.5 mg, improved 1/24 -1/25-26/25 BP a bit variable, recent med change, monitor for now Vitals:   03/07/23 0525 03/07/23 1419 03/07/23 2001 03/08/23 0422  BP: (!) 156/67 136/67 138/67 (!) 151/68   03/08/23 1344 03/08/23 1954 03/09/23 0420  03/09/23 1428  BP: (!) 153/73 (!) 166/80 119/81 (!) 165/69   03/09/23 2208 03/10/23 0533 03/10/23 2109 03/11/23 0445  BP: (!) 155/80 (!) 146/66 (!) 152/78 136/78  Increase amlodipine to 10mg  ,  9: Hyperlipidemia: continue atorvastatin 80mg  daily   10: DM-2: A1c = 6.4% on October, 2024 (no home meds)  -1/16 glucose stable 127 on BMP 1/13  11: Elevated serum creatinine/GFR ~55: ? Mild CKD; near baseline ~1.0        -Continue to encourage and monitor fluid intake       Latest Ref Rng & Units 03/11/2023    5:37 AM 03/08/2023    5:48 AM 03/01/2023    5:08 AM  BMP  Glucose 70 - 99 mg/dL 161  096  045   BUN 8 - 23 mg/dL 22  27  26    Creatinine 0.44 - 1.00 mg/dL 4.09  8.11  9.14   Sodium 135 - 145 mmol/L 139  135  136   Potassium 3.5 - 5.1 mmol/L 4.2  4.2  4.3   Chloride 98 - 111 mmol/L 105  104  105   CO2 22 - 32 mmol/L 25  22  22    Calcium 8.9 - 10.3 mg/dL 8.6  8.6  8.9   Still with mildly elevated BUN/Creat enc fluids try  off ARB Enc fluids  Recheck in am  12. Eye redness improved   -Artificial tears as needed ordered, continue to monitor  13. Constipation - Glycolax and senna  Type 5 large BM this am , continent 14.  Right great toe pain appears to be intermittent or may be associated with shoe wear, exam is negative, no history of trauma or evidence of trauma she had a similar episode of left great toe pain yesterday or the day before with out physical exam findings.   Will continue to monitor and use as needed Tylenol LOS: 21 days A FACE TO FACE EVALUATION WAS PERFORMED  Erick Colace 03/11/2023, 12:51 PM

## 2023-03-11 NOTE — Progress Notes (Addendum)
Patient ID: Kathleen Figueroa, female   DOB: December 30, 1935, 88 y.o.   MRN: [id number] Have sent FL2 to [facility] for consideration for admission. Have let [name] know and he is wanting a set day she is going. She will need to be accepted first and then insurance approve her.  12:01 PM Spoke with [facility] who has offered a bed for her. Aware will need to get insurance authorization. Will begin this process. Spoke with [name] via telephone to inform of this he was hopeful she would have 24-48 hours to process moving. He will discuss with pt today since it could happen as early as tomorrow to go to [facility] if receive approval and bed available. Son wants her to stay until her original discharge date which may not happen. Will keep all updated on information Reference number: [id number]

## 2023-03-11 NOTE — Progress Notes (Signed)
Occupational Therapy Session Note  Patient Details  Name: Kathleen Figueroa MRN: [id number] Date of Birth: [birth year]  Today's Date: 03/11/2023 OT Individual Time: 4098-1191 OT Individual Time Calculation (min): 74 min    Short Term Goals: Week 3:  OT Short Term Goal 1 (Week 3): Pt will integrate LUE into functional tasks with Mod multimodal cuing. OT Short Term Goal 2 (Week 3): Pt will maintain sitting balance EOB/EOM in preparation for functional transfers with supervision OT Short Term Goal 3 (Week 3): Pt will complete toilet transfer with min A consistently OT Short Term Goal 4 (Week 3): Pt will complete toileting with mod A consistently  Skilled Therapeutic Interventions/Progress Updates:  Pt greeted seated in w/c, pt agreeable to OT intervention.      Transfers/bed mobility/functional mobility: pt completed squat pivots to R and L side with MIN A, MOD verbal/visual cues for hand placement and set- up.    NMR: pt completed various activities focused on LUE coordination and L sided attention: -pt completed arch task with pt instructed to grasp rings with LUE and then take rings "over the rainbow" to challenge LUE motor planning and visual attention. Pt needed MAX A initially to place L hand on rings and MAX multimodal cues to initially locate ring on L side but with increased reps pt able to complete task with + time and only MIN verbal cues to complete task.  -worked on dynamic reaching from [facility] with pt able to use LUE to reach for horseshoes placed in pts L lower visual field. Pt did exceptionally well with this task with pt able to locate horseshoes easier but pt did need more assist to motor plan how to remove horseshoes from chair.    Ended session with pt seated in w/c with all needs within reach and safety belt alarm activated.                    Therapy Documentation Precautions:  Precautions Precautions: Fall, Other (comment) Precaution Comments: L hemiparesis, L visual  field cut, L inattention, L hemisensory loss Restrictions Weight Bearing Restrictions Per Provider Order: No  Pain: Unrated pain reported in RUE at times, rest breaks provided as needed.    Therapy/Group: Individual Therapy  Barron Schmid 03/11/2023, 12:10 PM

## 2023-03-11 NOTE — Progress Notes (Signed)
Physical Therapy Weekly Progress Note  Patient Details  Name: Kathleen Figueroa MRN: [id number] Date of Birth: Jan 28, 1936  Beginning of progress report period: March 05, 2023 End of progress report period: March 11, 2023  Today's Date: 03/11/2023 PT Individual Time: [phone] and 1105-1204 PT Individual Time Calculation (min): 42 min and 59 min  Patient has met 2 of 5 short term goals. Kathleen Figueroa continues to make steady progress with therapy; however, continues to have significant deficits of L hemisensory impairment, L inattention, and visual perceptual and visual acuity deficits. She is performing supine<>sit with supervision/CGA, squat pivot transfers with min A, and sit<>stand transfers with min/mod A. She is now safe to sit OOB in K5 manual wheelchair rather than requiring TIS wheelchair and plan to initiate wheelchair management and w/c propulsion. She is participating in gait training with +2 assist and skilled heavy mod A for balance and L LE management due to significant sensory incoordination with resultant impaired balance. Patient will benefit from continued CIR level skilled physical therapy with recommendation for follow-up therapy in SNF setting to continue progressing pt's independence prior to D/Cing home.  Patient continues to demonstrate the following deficits muscle joint tightness, decreased cardiorespiratoy endurance, impaired timing and sequencing, unbalanced muscle activation, decreased coordination, and decreased motor planning, decreased visual acuity, decreased visual perceptual skills, and decreased visual motor skills, decreased attention to left, decreased attention, decreased awareness, decreased problem solving, and delayed processing, and decreased sitting balance, decreased standing balance, decreased postural control, and decreased balance strategies and therefore will continue to benefit from skilled PT intervention to increase functional independence with  mobility.  Patient not progressing toward long term goals.  See goal revision..  Continue plan of care.  PT Short Term Goals Week 3:  PT Short Term Goal 1 (Week 3): Pt will perform supine<>sit using hospital bed features as needed with CGA PT Short Term Goal 1 - Progress (Week 3): Met PT Short Term Goal 2 (Week 3): Pt will perform bed<>chair transfers with CGA and mod verbal cuing PT Short Term Goal 2 - Progress (Week 3): Progressing toward goal PT Short Term Goal 3 (Week 3): Pt will perform sit<>stands using LRAD (not hallway rail) with min A PT Short Term Goal 3 - Progress (Week 3): Progressing toward goal PT Short Term Goal 4 (Week 3): Pt will perform w/c propulsion at least 31ft with min A PT Short Term Goal 4 - Progress (Week 3): Progressing toward goal PT Short Term Goal 5 (Week 3): Pt will continue participating in gait training using LRAD with mod A and +2 assist as needed PT Short Term Goal 5 - Progress (Week 3): Met Week 4:  PT Short Term Goal 1 (Week 4): = to LTGs based on ELOS  Skilled Therapeutic Interventions/Progress Updates:  Ambulation/gait training;Community reintegration;DME/adaptive equipment instruction;Neuromuscular re-education;Psychosocial support;Stair training;UE/LE Strength taining/ROM;Wheelchair propulsion/positioning;Balance/vestibular training;Discharge planning;Functional electrical stimulation;Pain management;Skin care/wound management;Therapeutic Activities;UE/LE Coordination activities;Disease management/prevention;Cognitive remediation/compensation;Functional mobility training;Patient/family education;Splinting/orthotics;Therapeutic Exercise;Visual/perceptual remediation/compensation   Session 1: Pt received supine in bed asleep, but easily awakens and is agreeable to therapy session. Therapist retrieved patient a K5 wheelchair to progress out of TIS w/c based on pt's improved trunk control and improving balance awareness. Plan to initiate wheelchair propulsion  and management training. Pt transitioned supine>sitting L EOB, HOB partially elevated but not using bedrail, with supervision and only verbal cuing for rotating pelvis towards L to be centered on EOB. Pt reports need to use bathroom. Stedy transfer to bathroom for time management due to urgency -  sit>stand EOB>stedy with CGA/light min A - provided min hand-over-hand assist to grab stedy bar with L hand and pt having improved coordination and motor planning to reach the target. Sit<>stands from stedy seat with CGA and standing in stedy with B UE support on bar with CGA/close SBA during dependent LB clothing management and peri-care - pt incontinent of bladder before making it to toilet. L stand pivot BSC over toilet>K5 wheelchair with skilled min A for lifting/pivoting hips. MD in for morning assessment and made aware of R great toe toenail pain. Pt left seated upright in K5 wheelchair with seat belt alarm on.   Session 2: Pt received sitting in K5 wheelchair awake and agreeable to therapy session. Pt reports the K5 wheelchair has been very comfortable so far. Transported to/from gym in w/c for time management and energy conservation.  Sit>stand w/c>+2 R HHA with skilled mod assist for lifting and balance - pt able to recall need to scoot forward, bring L LE back underneath her, and push up with R hand from armrest.   Gait training ~18ft + ~39ft + 54ft (seated brakes) using +2 R HHA with skilled mod/max assist for balance and L LE management - 3rd person w/c follow or close-by to bring w/c up at end for safety. Pt demonstrating the following gait deviations with therapist providing the described cuing and facilitation for improvement:  - during first walk pt with improved upright posture, improved L swing advancement although continues to excessively adduct by "kicking" it forward with knee extnded  - requires assist every step to place L foot safely and avoid ankle inversion rolling  - requires assist for  balance as pt starts to have L hip posteriorly rotated with L lateral trunk lean - frequently will have anterior vs posterior LOB as well as overall increased balance instability in all directions (+2 providing min assist with trunk control and balance) - 2nd walk pt having increased difficulty coordinating balance and LE stepping so had a seated rest break to "reset" - occasionally will have excessive L hip external rotation with excessively large step forward with adduction  - requires consistent facilitation for R weight shift during L swing  - requires manual facilitation for L hip stability and L hip extension during L stance - guarding L knee during stance but no buckling  Therapist initiated education with pt on w/c brakes with pt having good immediate recall of education and locating both brakes using her R hand - therapist placed yellow coban on brakes to also encourage visually finding them. Continue to encourage pt to manage brakes with increased independence during session. Transported back to room and pt left seated in w/c with needs in reach, seat belt alarm on, and L UE supported on padded 1/2 lap tray.    Therapy Documentation Precautions:  Precautions Precautions: Fall, Other (comment) Precaution Comments: L hemiparesis, L visual field cut, L inattention, L hemisensory loss Restrictions Weight Bearing Restrictions Per Provider Order: No   Pain: Session 1: Reports R great toe toenail pain - MD made aware.  Session 2: Denies L knee pain when walking. Denies R toenail pain.   Therapy/Group: Individual Therapy  [name] , PT, DPT, NCS, CSRS 03/11/2023, 7:56 AM

## 2023-03-11 NOTE — Progress Notes (Signed)
Physical Therapy Session Note  Patient Details  Name: Kathleen Figueroa MRN: [id number] Date of Birth: 1935-06-13  Today's Date: 03/11/2023 PT Individual Time: 1345-1413 PT Individual Time Calculation (min): 28 min   Short Term Goals: Week 2:  PT Short Term Goal 1 (Week 2): Pt will perform supine<>sit with min A PT Short Term Goal 1 - Progress (Week 2): Met PT Short Term Goal 2 (Week 2): Pt will perform sit<>stands using LRAD with min A consistently PT Short Term Goal 2 - Progress (Week 2): Met (using using stable surface for R UE support; otherwise, mod A) PT Short Term Goal 3 (Week 2): Pt will perform bed<>chair transfers using LRAD with min A PT Short Term Goal 3 - Progress (Week 2): Met (squat pivot with verbal cuing) PT Short Term Goal 4 (Week 2): Pt will ambulate at least 70ft using LRAD with mod A of 1 and +2 assist as needed PT Short Term Goal 4 - Progress (Week 2): Met Week 3:  PT Short Term Goal 1 (Week 3): Pt will perform supine<>sit using hospital bed features as needed with CGA PT Short Term Goal 1 - Progress (Week 3): Met PT Short Term Goal 2 (Week 3): Pt will perform bed<>chair transfers with CGA and mod verbal cuing PT Short Term Goal 2 - Progress (Week 3): Progressing toward goal PT Short Term Goal 3 (Week 3): Pt will perform sit<>stands using LRAD (not hallway rail) with min A PT Short Term Goal 3 - Progress (Week 3): Progressing toward goal PT Short Term Goal 4 (Week 3): Pt will perform w/c propulsion at least 49ft with min A PT Short Term Goal 4 - Progress (Week 3): Progressing toward goal PT Short Term Goal 5 (Week 3): Pt will continue participating in gait training using LRAD with mod A and +2 assist as needed PT Short Term Goal 5 - Progress (Week 3): Met Week 4:  PT Short Term Goal 1 (Week 4): = to LTGs based on ELOS  Skilled Therapeutic Interventions/Progress Updates:    Pt presents up in new w/c - LLE not on footplate and pt unaware. Pt able to demonstrate how to  use and manage breaks. Did need verbal cues and extra time for L side but verbally knows she need to also lock and unlock that side. Focused on w/c mobility training with hemi technique using RUE and RLE with mod assist overall and mod verbal cues for initial technique. Once abel to get in a rhythm could perform with min to supervision assist but unable to carryover when stopped for a break. PT added towel to L footplate to decrease chance of injury on metal with L leg due to impaired sensation and proprioception. Pt performed up to 150' at a time with supervision to mod assist overall for propulsion. May benefit from theraband on R rim for better grip.   Therapy Documentation Precautions:  Precautions Precautions: Fall, Other (comment) Precaution Comments: L hemiparesis, L visual field cut, L inattention, L hemisensory loss Restrictions Weight Bearing Restrictions Per Provider Order: No  Pain:  No compliaints of pain   Therapy/Group: Individual Therapy  Karolee Stamps Darrol Poke, PT, DPT, CBIS  03/11/2023, 3:12 PM

## 2023-03-11 NOTE — Plan of Care (Signed)
  Problem: RH Ambulation Goal: LTG Patient will ambulate in home environment (PT) Description: LTG: Patient will ambulate in home environment, # of feet with assistance (PT). Outcome: Not Met (add Reason) Flowsheets (Taken 03/11/2023 1230) LTG: Pt will ambulate in home environ  assist needed:: (goal discontinued because do not anticipate pt will reach functional ambulatory level prior to D/C) -- Note: goal discontinued because do not anticipate pt will reach functional ambulatory level prior to D/C    Problem: RH Balance Goal: LTG Patient will maintain dynamic standing balance (PT) Description: LTG:  Patient will maintain dynamic standing balance with assistance during mobility activities (PT) Flowsheets (Taken 03/11/2023 1230) LTG: Pt will maintain dynamic standing balance during mobility activities with:: (downgraded based on pt's CLOF and ELOS) Moderate Assistance - Patient 50 - 74% Note: downgraded based on pt's CLOF and ELOS    Problem: Sit to Stand Goal: LTG:  Patient will perform sit to stand with assistance level (PT) Description: LTG:  Patient will perform sit to stand with assistance level (PT) Flowsheets (Taken 03/11/2023 1230) LTG: PT will perform sit to stand in preparation for functional mobility with assistance level: (downgraded based on pt's CLOF and ELOS) Moderate Assistance - Patient 50 - 74% Note: downgraded based on pt's CLOF and ELOS    Problem: RH Bed to Chair Transfers Goal: LTG Patient will perform bed/chair transfers w/assist (PT) Description: LTG: Patient will perform bed to chair transfers with assistance (PT). Flowsheets (Taken 03/11/2023 1230) LTG: Pt will perform Bed to Chair Transfers with assistance level: (downgraded based on pt's CLOF and ELOS) Minimal Assistance - Patient > 75% Note: downgraded based on pt's CLOF and ELOS    Problem: RH Car Transfers Goal: LTG Patient will perform car transfers with assist (PT) Description: LTG: Patient will perform car  transfers with assistance (PT). Flowsheets (Taken 03/11/2023 1230) LTG: Pt will perform car transfers with assist:: (downgraded based on pt's CLOF and ELOS) Minimal Assistance - Patient > 75% Note: downgraded based on pt's CLOF and ELOS    Problem: RH Ambulation Goal: LTG Patient will ambulate in controlled environment (PT) Description: LTG: Patient will ambulate in a controlled environment, # of feet with assistance (PT). Flowsheets (Taken 03/11/2023 1230) LTG: Pt will ambulate in controlled environ  assist needed:: (with skilled assistance and +2 assist - downgraded based on pt's CLOF and ELOS) Moderate Assistance - Patient 50 - 74% LTG: Ambulation distance in controlled environment: 52ft using LRAD Note: downgraded based on pt's CLOF and ELOS    Problem: RH Wheelchair Mobility Goal: LTG Patient will propel w/c in controlled environment (PT) Description: LTG: Patient will propel wheelchair in controlled environment, # of feet with assist (PT) Flowsheets (Taken 03/11/2023 1230) LTG: Pt will propel w/c in controlled environ  assist needed:: (downgraded based on pt's CLOF and ELOS) Minimal Assistance - Patient > 75% LTG: Propel w/c distance in controlled environment: 33ft Note: downgraded based on pt's CLOF and ELOS  Goal: LTG Patient will propel w/c in home environment (PT) Description: LTG: Patient will propel wheelchair in home environment, # of feet with assistance (PT). Flowsheets (Taken 03/11/2023 1230) LTG: Pt will propel w/c in home environ  assist needed:: (downgraded based on pt's CLOF and ELOS) Minimal Assistance - Patient > 75% LTG: Propel w/c distance in home environment: 43ft Note: downgraded based on pt's CLOF and ELOS

## 2023-03-11 NOTE — Plan of Care (Signed)
  Problem: Consults Goal: RH STROKE PATIENT EDUCATION Description: See Patient Education module for education specifics  Outcome: Progressing   Problem: RH BOWEL ELIMINATION Goal: RH STG MANAGE BOWEL WITH ASSISTANCE Description: STG Manage Bowel with MOD I Assistance. Outcome: Progressing Goal: RH STG MANAGE BOWEL W/MEDICATION W/ASSISTANCE Description: STG Manage Bowel with Medication with MOD I Assistance. Outcome: Progressing   Problem: RH BLADDER ELIMINATION Goal: RH STG MANAGE BLADDER WITH ASSISTANCE Description: STG Manage Bladder With toileting Assistance Outcome: Progressing   Problem: RH SAFETY Goal: RH STG ADHERE TO SAFETY PRECAUTIONS W/ASSISTANCE/DEVICE Description: STG Adhere to Safety Precautions With Cues Vitals stable and A&Ox4. Pain controlled with robaxin   Outcome: Progressing   Problem: RH KNOWLEDGE DEFICIT Goal: RH STG INCREASE KNOWLEDGE OF DIABETES Description: Patient and daughter will be able to manage independently with educational resources provided Outcome: Progressing Goal: RH STG INCREASE KNOWLEDGE OF HYPERTENSION Outcome: Progressing Goal: RH STG INCREASE KNOWLEGDE OF HYPERLIPIDEMIA Description: Patient and daughter will be able to manage independently with educational resources provided Outcome: Progressing Goal: RH STG INCREASE KNOWLEDGE OF STROKE PROPHYLAXIS Description: Patient and daughter will be able to manage independently with educational resources provided Outcome: Progressing   Problem: RH Vision Goal: RH LTG Vision (Specify) Outcome: Progressing

## 2023-03-11 NOTE — NC FL2 (Signed)
[place] MEDICAID FL2 LEVEL OF CARE FORM     IDENTIFICATION  Patient Name: Kathleen Figueroa Birthdate: 07-11-1935 Sex: female Admission Date (Current Location): 02/18/2023  [facility] and [place] Number:  Producer, television/film/video and Address:  [place]. [facility], [address], [place], [place] [address]      Provider Number: [id number]  Attending Physician Name and Address:  Erick Colace, MD  Relative Name and Phone Number:  Pati Gallo [phone]  [name] [phone]    Current Level of Care: Other (Comment) (rehab) Recommended Level of Care: Skilled Nursing Facility Prior Approval Number:    Date Approved/Denied:   PASRR Number: [id number]  Discharge Plan: SNF    Current Diagnoses: Patient Active Problem List   Diagnosis Date Noted   Stage 2 chronic kidney disease 02/20/2023   Diabetes mellitus (HCC) 02/20/2023   Primary hypertension 02/20/2023   CVA (cerebral vascular accident) (HCC) 02/18/2023   History of CVA (cerebrovascular accident) 02/11/2023   Acute CVA (cerebrovascular accident) (HCC) 02/11/2023   Hypokalemia 11/12/2022   S/P carotid endarterectomy 05/28/2014   Carotid artery disease (HCC) 01/14/2014   Essential hypertension    Hyperlipidemia 01/12/2014   Diabetes mellitus without complication (HCC)     Orientation RESPIRATION BLADDER Height & Weight     Self, Place, Situation  Normal Incontinent Weight: 141 lb 8.6 oz (64.2 kg) Height:  5\' 6"  (167.6 cm)  BEHAVIORAL SYMPTOMS/MOOD NEUROLOGICAL BOWEL NUTRITION STATUS      Continent Diet (Dys 3 thin liquids)  AMBULATORY STATUS COMMUNICATION OF NEEDS Skin   Extensive Assist Verbally Normal                       Personal Care Assistance Level of Assistance  Bathing, Feeding, Dressing Bathing Assistance: Limited assistance Feeding assistance: Limited assistance Dressing Assistance: Limited assistance     Functional Limitations Warehouse manager, Speech Sight  Info: Impaired   Speech Info: Impaired    SPECIAL CARE FACTORS FREQUENCY  PT (By licensed PT), OT (By licensed OT), Bowel and bladder program, Speech therapy     PT Frequency: 5x week OT Frequency: 5x week Bowel and Bladder Program Frequency: Timed tolieting   Speech Therapy Frequency: 5x week      Contractures Contractures Info: Not present    Additional Factors Info  Code Status, Allergies Code Status Info: Full Code Allergies Info: Aspirin           Current Medications (03/11/2023):  This is the current hospital active medication list Current Facility-Administered Medications  Medication Dose Route Frequency Provider Last Rate Last Admin   acetaminophen (TYLENOL) tablet 325-650 mg  325-650 mg Oral Q4H PRN Milinda Antis, PA-C   650 mg at 03/11/23 0511   alum & mag hydroxide-simeth (MAALOX/MYLANTA) 200-200-20 MG/5ML suspension 30 mL  30 mL Oral Q4H PRN Milinda Antis, PA-C       amLODipine (NORVASC) tablet 10 mg  10 mg Oral Daily Erick Colace, MD   10 mg at 03/11/23 4132   aspirin EC tablet 81 mg  81 mg Oral Daily Milinda Antis, PA-C   81 mg at 03/11/23 0816   atorvastatin (LIPITOR) tablet 80 mg  80 mg Oral Daily Milinda Antis, PA-C   80 mg at 03/11/23 0816   bisacodyl (DULCOLAX) EC tablet 5 mg  5 mg Oral Daily PRN Fanny Dance, MD   5 mg at 03/06/23 0839   enoxaparin (LOVENOX) injection 40 mg  40 mg  Subcutaneous Q24H Erick Colace, MD   40 mg at 03/10/23 1141   gabapentin (NEURONTIN) capsule 200 mg  200 mg Oral TID Erick Colace, MD   200 mg at 03/11/23 0816   guaiFENesin-dextromethorphan (ROBITUSSIN DM) 100-10 MG/5ML syrup 10 mL  10 mL Oral Q6H PRN Milinda Antis, PA-C       melatonin tablet 5 mg  5 mg Oral QHS PRN Setzer, Lynnell Jude, PA-C       methocarbamol (ROBAXIN) tablet 500 mg  500 mg Oral Q6H PRN Milinda Antis, PA-C   500 mg at 03/11/23 0515   ondansetron (ZOFRAN) tablet 4 mg  4 mg Oral Q6H PRN Milinda Antis, PA-C       Or    ondansetron [facility]) injection 4 mg  4 mg Intravenous Q6H PRN Setzer, Lynnell Jude, PA-C       polyethylene glycol (MIRALAX / GLYCOLAX) packet 17 g  17 g Oral BID Erick Colace, MD   17 g at 03/11/23 1610   polyvinyl alcohol (LIQUIFILM TEARS) 1.4 % ophthalmic solution 2 drop  2 drop Left Eye PRN Fanny Dance, MD       sodium phosphate (FLEET) enema 1 enema  1 enema Rectal Once PRN Setzer, Lynnell Jude, PA-C       sorbitol 70 % solution 30 mL  30 mL Oral Daily PRN Fanny Dance, MD   30 mL at 03/10/23 0107   ticagrelor (BRILINTA) tablet 90 mg  90 mg Oral BID Milinda Antis, PA-C   90 mg at 03/11/23 9604     Discharge Medications: Please see discharge summary for a list of discharge medications.  Relevant Imaging Results:  Relevant Lab Results:   Additional Information SSN: [id number]  Shawnn Bouillon, Lemar Livings, LCSW

## 2023-03-12 NOTE — Progress Notes (Signed)
Patient ID: Kathleen Figueroa, female   DOB: 23-Dec-1935, 88 y.o.   MRN: [id number]  Have insurance auth for pt to transfer to [facility] on Monday. Spoke with pt and son-Freddie to let know of the plan. Auth begins 1/31-2/4 Kathleen Figueroa CM [phone]. Will confirm with Kathleen Figueroa-Admissions at [facility] Monday and arrange non-emergency ambulance transfer for 10:00. Team aware of plan.

## 2023-03-12 NOTE — Group Note (Signed)
Patient Details Name: Kathleen Figueroa MRN: [id number] DOB: [birth year] Today's Date: 03/12/2023  Time Calculation: OT Group Time Calculation OT Group Start Time: 1100 OT Group Stop Time: 1200 OT Group Time Calculation (min): 60 min      Group Description: Dance Group: Pt participated in dance group with an emphasis on social interaction, motor planning, increasing overall activity tolerance and bimanual tasks. All songs were selected by group members. Dance moves included AROM of BUE/BLE gross motor movements with an emphasis on building functional endurance.    Individual level documentation: Patient completed group from sitting level. Patientt needed supervision to complete various dance moves with cues for L U/LE positioning.  Patient needed min modifications during group.  Pain:  0/10  Precautions:  L inattention, Falls  Clide Deutscher 03/12/2023, 12:13 PM

## 2023-03-12 NOTE — Progress Notes (Signed)
Speech Language Pathology Daily Session Note  Patient Details  Name: Kathleen Figueroa MRN: [id number] Date of Birth: 1935-10-13  Today's Date: 03/12/2023 SLP Individual Time: 1017-1100 SLP Individual Time Calculation (min): 43 min  Short Term Goals: Week 4: SLP Short Term Goal 1 (Week 4): Patient will utilize swallowing compensatory strategies to reduce s/sx of aspiration during consumption of PO given supervision A SLP Short Term Goal 2 (Week 4): Patient will demonstrate problem solving in basic daily situations given supervision multimodal A SLP Short Term Goal 3 (Week 4): Patient will attend to L side during functional tasks given min multimodal A  Skilled Therapeutic Interventions: Skilled therapy session focused on cognitive and dysphagia goals. SLP faciliated session by assessing tolerance of D3 textures. Patient consumed solids with adequate mastication times and oral clearance given mod i A. No s/sx of aspiration. Continue current diet and full supervision due to visual limitations. SLP targeted cognitive goals through categorization task. Given category, patient was instructed to locate photo of item in matching category given pictures laid across table span. This targeted both problem solving and L attention goals. Patient required minA to attend to L in order to complete task. Patient left in [facility] in gym for next therapy session.   Pain None reported  Therapy/Group: Individual Therapy  Dasie Chancellor M.A., CF-SLP

## 2023-03-12 NOTE — Progress Notes (Signed)
PROGRESS NOTE   Subjective/Complaints:  Pt aware of D/C  date upcoming, seems upbeat this am singing   ROS-denies chest pain or shortness of breath.  Denies abdominal pain, n/v/d.    Objective:   No results found.   No results for input(s): "WBC", "HGB", "HCT", "PLT" in the last 72 hours.    Recent Labs    03/11/23 0537  NA 139  K 4.2  CL 105  CO2 25  GLUCOSE 109*  BUN 22  CREATININE 1.07*  CALCIUM 8.6*      Intake/Output Summary (Last 24 hours) at 03/12/2023 0852 Last data filed at 03/12/2023 0800 Gross per 24 hour  Intake 472 ml  Output 500 ml  Net -28 ml         Physical Exam: Vital Signs Blood pressure (!) 152/74, pulse 70, temperature 98.3 F (36.8 C), temperature source Oral, resp. rate 18, height 5\' 6"  (1.676 m), weight 64.2 kg, SpO2 98%.  General: No acute distress, resting in bed, comfortable Mood and affect are appropriate Heart: Regular rate and rhythm no rubs murmurs or extra sounds Lungs: Clear to auscultation, breathing unlabored, no rales or wheezes Abdomen: Positive bowel sounds, soft nontender to palpation, nondistended Extremities: No clubbing, cyanosis, or edema Skin: No evidence of breakdown, no evidence of rash over exposed surfaces Neuro: Speech with moderate dysarthria    Musculoskeletal: limited Left knee flexion to 90 degrees . 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  Left hemineglect Absent sensation to light touch left upper and lower extremities Musculoskeletal: Right great toe mild tenderness to pinch, onychogryphosis, no tenderness around the nailbed no swelling at the toe or at the MTP no erythema or drainage.  motor strength is 5/5 in right deltoid, bicep, tricep, grip, hip flexor, knee extensors, ankle dorsiflexor and plantar flexor Left side difficult to examine due to severe sensory ataxia but at least 4/5 Wandering LUE movements   Sensory exam absent sensation to light touch and proprioception in LEFT upper and lower extremities     Assessment/Plan: 1. Functional deficits which require 3+ hours per day of interdisciplinary therapy in a comprehensive inpatient rehab setting. Physiatrist is providing close team supervision and 24 hour management of active medical problems listed below. Physiatrist and rehab team continue to assess barriers to discharge/monitor patient progress toward functional and medical goals  Care Tool:  Bathing    Body parts bathed by patient: Left arm, Chest, Abdomen, Front perineal area, Right upper leg, Left upper leg, Face   Body parts bathed by helper: Right arm, Buttocks, Left lower leg, Right lower leg     Bathing assist Assist Level: Moderate Assistance - Patient 50 - 74%     Upper Body Dressing/Undressing Upper body dressing   What is the patient wearing?: Pull over shirt    Upper body assist Assist Level: Moderate Assistance - Patient 50 - 74%    Lower Body Dressing/Undressing Lower body dressing      What is the patient wearing?: Pants, Underwear/pull up     Lower body assist Assist for lower body dressing: Maximal Assistance - Patient 25 - 49%     Toileting Toileting  Toileting assist Assist for toileting: Total Assistance - Patient < 25%     Transfers Chair/bed transfer  Transfers assist  Chair/bed transfer activity did not occur: Safety/medical concerns (unsafe to get up)  Chair/bed transfer assist level: Minimal Assistance - Patient > 75% (squat pivot)     Locomotion Ambulation   Ambulation assist   Ambulation activity did not occur: Safety/medical concerns (requires skilled assistance)  Assist level: 2 helpers Assistive device: Other (comment) (R hallway rail) Max distance: 25'   Walk 10 feet activity   Assist  Walk 10 feet activity did not occur: Safety/medical concerns  Assist level: 2 helpers Assistive device: Other (comment)  (hand rail.)   Walk 50 feet activity   Assist Walk 50 feet with 2 turns activity did not occur: Safety/medical concerns  Assist level: 2 helpers      Walk 150 feet activity   Assist Walk 150 feet activity did not occur: Safety/medical concerns         Walk 10 feet on uneven surface  activity   Assist Walk 10 feet on uneven surfaces activity did not occur: Safety/medical concerns         Wheelchair     Assist Is the patient using a wheelchair?: Yes Type of Wheelchair: Manual    Wheelchair assist level: Moderate Assistance - Patient 50 - 74% Max wheelchair distance: 150    Wheelchair 50 feet with 2 turns activity    Assist        Assist Level: Moderate Assistance - Patient 50 - 74%   Wheelchair 150 feet activity     Assist      Assist Level: Moderate Assistance - Patient 50 - 74%   Blood pressure (!) 152/74, pulse 70, temperature 98.3 F (36.8 C), temperature source Oral, resp. rate 18, height 5\' 6"  (1.676 m), weight 64.2 kg, SpO2 98%.  Medical Problem List and Plan: 1. 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  Antithrombotics: -DVT/anticoagulation:  Pharmaceutical: lovenox 40mg  qd -antiplatelet therapy: Aspirin and Brilinta for three 90 days followed by aspirin alone   -hx left CEA 3. Pain Management: Tylenol as needed   -1/17 gabapentin 100 mg 3 times daily for neuropathic pain-- helping -Severe left knee OA with contracture , pain ok unless knee is flexed to >90deg  4. Mood/Behavior/Sleep: LCSW to evaluate and provide emotional support             -antipsychotic agents: n/a              - Melatonin PRN   5. Neuropsych/cognition: This patient is not quite capable of making decisions on her own behalf.   6. Skin/Wound Care: Routine  skin care checks   7. Fluids/Electrolytes/Nutrition: Routine Is and Os and follow-up chemistries   8: Hypertension: monitor TID and prn (Diovan 320 mg daily at home) On avapro 150mg  will increase to 300mg   1/17 fair control continue current regimen and monitor Restart amlodipine , low dose 2.5 mg, improved 1/24 -1/25-26/25 BP a bit variable, recent med change, monitor for now Vitals:   03/08/23 1344 03/08/23 1954 03/09/23 0420 03/09/23 1428  BP: (!) 153/73 (!) 166/80 119/81 (!) 165/69   03/09/23 2208 03/10/23  0533 03/10/23 2109 03/11/23 0445  BP: (!) 155/80 (!) 146/66 (!) 152/78 136/78   03/11/23 1315 03/11/23 1851 03/11/23 2038 03/12/23 0423  BP: 118/65 (!) 147/75 (!) 140/69 (!) 152/74  Increase amlodipine to 10mg  ,  9: Hyperlipidemia: continue atorvastatin 80mg  daily   10: DM-2: A1c = 6.4% on October, 2024 (no home meds)  -1/16 glucose stable 127 on BMP 1/13  11: Elevated serum creatinine/GFR ~55: ? Mild CKD; near baseline ~1.0        -Continue to encourage and monitor fluid intake       Latest Ref Rng & Units 03/11/2023    5:37 AM 03/08/2023    5:48 AM 03/01/2023    5:08 AM  BMP  Glucose 70 - 99 mg/dL 829  562  130   BUN 8 - 23 mg/dL 22  27  26    Creatinine 0.44 - 1.00 mg/dL 8.65  7.84  6.96   Sodium 135 - 145 mmol/L 139  135  136   Potassium 3.5 - 5.1 mmol/L 4.2  4.2  4.3   Chloride 98 - 111 mmol/L 105  104  105   CO2 22 - 32 mmol/L 25  22  22    Calcium 8.9 - 10.3 mg/dL 8.6  8.6  8.9   Still with mildly elevated BUN/Creat enc fluids try  off ARB Enc fluids  Recheck in am  12. Eye redness improved   -Artificial tears as needed ordered, continue to monitor  13. Constipation - Glycolax and senna  Type 5 large BM this am , continent 14.  Right great toe pain appears to be intermittent or may be associated with shoe wear, exam is negative except mild tenderness to pinch , no history of trauma or evidence of trauma she had a similar episode of left great toe pain  yesterday or the day before with out physical exam findings.  Will continue to monitor and use as needed Tylenol LOS: 22 days A FACE TO FACE EVALUATION WAS PERFORMED  Erick Colace 03/12/2023, 8:52 AM

## 2023-03-12 NOTE — Plan of Care (Incomplete)
  Problem: Consults Goal: RH STROKE PATIENT EDUCATION Description: See Patient Education module for education specifics  Outcome: Progressing   Problem: RH BOWEL ELIMINATION Goal: RH STG MANAGE BOWEL WITH ASSISTANCE Description: STG Manage Bowel with MOD I Assistance. Outcome: Progressing Goal: RH STG MANAGE BOWEL W/MEDICATION W/ASSISTANCE Description: STG Manage Bowel with Medication with MOD I Assistance. Outcome: Progressing   Problem: RH BLADDER ELIMINATION Goal: RH STG MANAGE BLADDER WITH ASSISTANCE Description: STG Manage Bladder With toileting Assistance Outcome: Progressing   Problem: RH SAFETY Goal: RH STG ADHERE TO SAFETY PRECAUTIONS W/ASSISTANCE/DEVICE Description: STG Adhere to Safety Precautions With Cues  Outcome: Progressing   Problem: RH KNOWLEDGE DEFICIT Goal: RH STG INCREASE KNOWLEDGE OF DIABETES Description: Patient and daughter will be able to manage independently with educational resources provided Outcome: Progressing Goal: RH STG INCREASE KNOWLEDGE OF HYPERTENSION Outcome: Progressing Goal: RH STG INCREASE KNOWLEGDE OF HYPERLIPIDEMIA Description: Patient and daughter will be able to manage independently with educational resources provided Outcome: Progressing Goal: RH STG INCREASE KNOWLEDGE OF STROKE PROPHYLAXIS Description: Patient and daughter will be able to manage independently with educational resources provided Outcome: Progressing   Problem: RH Vision Goal: RH LTG Vision (Specify) Outcome: Progressing

## 2023-03-12 NOTE — Plan of Care (Signed)
  Problem: Consults Goal: RH STROKE PATIENT EDUCATION Description: See Patient Education module for education specifics  Outcome: Progressing   Problem: RH BOWEL ELIMINATION Goal: RH STG MANAGE BOWEL WITH ASSISTANCE Description: STG Manage Bowel with MOD I Assistance. Outcome: Progressing Goal: RH STG MANAGE BOWEL W/MEDICATION W/ASSISTANCE Description: STG Manage Bowel with Medication with MOD I Assistance. Outcome: Progressing   Problem: RH BLADDER ELIMINATION Goal: RH STG MANAGE BLADDER WITH ASSISTANCE Description: STG Manage Bladder With toileting Assistance Outcome: Progressing   Problem: RH SAFETY Goal: RH STG ADHERE TO SAFETY PRECAUTIONS W/ASSISTANCE/DEVICE Description: STG Adhere to Safety Precautions With Cues  Outcome: Progressing   Problem: RH KNOWLEDGE DEFICIT Goal: RH STG INCREASE KNOWLEDGE OF DIABETES Description: Patient and daughter will be able to manage independently with educational resources provided Outcome: Progressing Goal: RH STG INCREASE KNOWLEDGE OF HYPERTENSION Outcome: Progressing Goal: RH STG INCREASE KNOWLEGDE OF HYPERLIPIDEMIA Description: Patient and daughter will be able to manage independently with educational resources provided Outcome: Progressing Goal: RH STG INCREASE KNOWLEDGE OF STROKE PROPHYLAXIS Description: Patient and daughter will be able to manage independently with educational resources provided Outcome: Progressing   Problem: RH Vision Goal: RH LTG Vision (Specify) Outcome: Progressing

## 2023-03-12 NOTE — Progress Notes (Signed)
Occupational Therapy Session Note  Patient Details  Name: Kathleen Figueroa MRN: [id number] Date of Birth: September 18, 1935  Today's Date: 03/12/2023 OT Individual Time: 9629-5284 OT Individual Time Calculation (min): 31 min    Short Term Goals: Week 3:  OT Short Term Goal 1 (Week 3): Pt will integrate LUE into functional tasks with Mod multimodal cuing. OT Short Term Goal 2 (Week 3): Pt will maintain sitting balance EOB/EOM in preparation for functional transfers with supervision OT Short Term Goal 3 (Week 3): Pt will complete toilet transfer with min A consistently OT Short Term Goal 4 (Week 3): Pt will complete toileting with mod A consistently  Skilled Therapeutic Interventions/Progress Updates:     Pt received reclined in bed presenting to be in good spirits receptive to skilled OT session reporting 5/10 pain in R big toe- OT offering intermittent rest breaks, repositioning, and therapeutic support to optimize participation in therapy session. Focus this session BADL retraining to increase independence and decrease overall bourdon of care. Pt transitioned to EOB with HOB slightly elevated using bed rail with supervision +min verbal cues. Sitting EOB, Pt maintained sitting balance with close supervision to CGA this session. Pt able to recall hemi-dressing technique doffing dirty shirt with supervision and donning clean shirt with min A- assistance required for weaving L UE only. Pt crossed L LE over R and weaved L foot into loose fitting joggers with CGA and then weaved R LE by reaching towards ground. Pt stood while holding onto bed rail with min A and brought pants to waist with light mod A provided for balance and assistance required to fully bring pants over L hip. Pt urgently requesting to use restroom. Squat pivot to R EOB > wc min A. Squat pivot wc > BSC positioned over toilet using grab bar min A +mod verbal cues for technique. Sit > stand using grab bar min A. Pants doffed from waist in standing  max A. Pt with continent void in toilet!!! Nursing staff informed. Pt able to perform anterior peri-care seated supervision. Sit > Stand using R grab bar min A. OT brought Pt's pants to waist with min A required for standing balance. Stand pivot to wc using R grab bar min A for balance and L LE positioning. Pt was left resting in wc with call bell in reach, seatbelt alarm on, and all needs met.    Therapy Documentation Precautions:  Precautions Precautions: Fall, Other (comment) Precaution Comments: L hemiparesis, L visual field cut, L inattention, L hemisensory loss Restrictions Weight Bearing Restrictions Per Provider Order: No   Therapy/Group: Individual Therapy  Clide Deutscher 03/12/2023, 7:59 AM

## 2023-03-12 NOTE — Progress Notes (Signed)
Occupational Therapy Discharge Summary  Patient Details  Name: Kathleen Figueroa MRN: [id number] Date of Birth: 28-Nov-1935  Date of Discharge from OT service:{MONTH:10108} {NUMBERS 1-31 (DATE):31396}, {YEAR HISTORY:31397}  {CHL IP REHAB OT TIME CALCULATIONS:304400400}   Patient has met {NUMBERS 0-12:18577} of {NUMBERS 0-12:18577} long term goals due to {due WU:9811914}.  Patient to discharge at overall {LOA:3049010} level.  Patient's care partner {care partner:3041650} to provide the necessary {assistance:3041652} assistance at discharge.    Reasons goals not met: ***  Recommendation:  Patient will benefit from ongoing skilled OT services in {setting:3041680} to continue to advance functional skills in the area of {ADL/iADL:3041649}.  Equipment: {equipment:3041657}  Reasons for discharge: {Reason for discharge:3049018}  Patient/family agrees with progress made and goals achieved: {Pt/Family agree with progress/goals:3049020}  OT Discharge Precautions/Restrictions  Precautions Precautions: Fall;Other (comment) Precaution Comments: L hemiparesis, L visual field cut, L inattention, L hemisensory loss Restrictions Weight Bearing Restrictions Per Provider Order: No ADL ADL Eating: Minimal assistance, Maximal cueing Where Assessed-Eating: Chair Grooming: Minimal assistance, Maximal cueing Where Assessed-Grooming: Chair Upper Body Bathing: Minimal assistance Where Assessed-Upper Body Bathing: Shower Lower Body Bathing: Minimal assistance Where Assessed-Lower Body Bathing: Shower Upper Body Dressing: Minimal assistance Where Assessed-Upper Body Dressing: Edge of bed Lower Body Dressing: Moderate assistance Where Assessed-Lower Body Dressing: Edge of bed Toileting: Moderate assistance Where Assessed-Toileting: Teacher, adult education: Curator Method: Ambulance person: Grab bars, Gaffer: Not  assessed Film/video editor: Insurance underwriter Method: Administrator: Grab bars, Sales promotion account executive Baseline Vision/History: 1 Wears glasses Patient Visual Report: Blurring of vision Vision Assessment?: Yes Eye Alignment: Within Functional Limits (R gaze preference) Alignment/Gaze Preference: Head turned;Gaze right Tracking/Visual Pursuits: Requires cues, head turns, or add eye shifts to track;Other (comment) (Pt unable to track past midline to the L) Saccades: Additional head turns occurred during testing Convergence: Impaired (comment) Visual Fields: Left visual field deficit Additional Comments: Pt with severe L hemianopsia, not able to see objects in L visual field. Perception  Perception: Impaired Perception-Other Comments: L inattention, however improved from inital eval Praxis Praxis: Impaired Praxis Impairment Details: Initiation;Motor planning;Organization Cognition Cognition Overall Cognitive Status: Impaired/Different from baseline Arousal/Alertness: Awake/alert Orientation Level: Person;Place;Situation Person: Oriented Place: Oriented Situation: Oriented Memory: Impaired Memory Impairment: Retrieval deficit;Storage deficit Attention: Selective;Sustained Focused Attention: Appears intact Sustained Attention: Appears intact Selective Attention: Impaired Awareness: Impaired Awareness Impairment: Emergent impairment Problem Solving: Impaired Problem Solving Impairment: Verbal basic;Functional basic Safety/Judgment: Impaired Brief Interview for Mental Status (BIMS) Repetition of Three Words (First Attempt): 3 Temporal Orientation: Year: Correct Temporal Orientation: Month: Accurate within 5 days Temporal Orientation: Day: Correct Recall: "Sock": Yes, after cueing ("something to wear") Recall: "Blue": Yes, no cue required Recall: "Bed": Yes, no cue required BIMS Summary Score:  14 Sensation Sensation Light Touch: Impaired Detail Light Touch Impaired Details: Absent LLE;Absent LUE Hot/Cold: Impaired by gross assessment Hot/Cold Impaired Details: Impaired LUE;Impaired LLE Proprioception: Impaired Detail Proprioception Impaired Details: Impaired LLE;Impaired LUE Stereognosis: Not tested Coordination Gross Motor Movements are Fluid and Coordinated: No Fine Motor Movements are Fluid and Coordinated: No Coordination and Movement Description: Significant L hemi-sensory and proprioceptive deficits impacting awareness and positioning of L UE and L LE in addition to hemiparesis Finger Nose Finger Test: Smooth equal movements on R, however significant dysmetria and undershooting on L d/t proprioceptive deficits; improvement from baseline using compensatory techniques Motor  Motor Motor: Hemiplegia;Motor impersistence;Other (comment);Ataxia (sensory ataxia) Motor - Skilled Clinical Observations: Significant L hemi-sensory and  proprioceptive deficits impacting awareness and positioning of L UE and L LE in addition to hemiparesis Mobility  Bed Mobility Bed Mobility: Sit to Supine;Supine to Sit Rolling Right: Supervision/verbal cueing Rolling Left: Supervision/Verbal cueing Supine to Sit: Supervision/Verbal cueing (using bed features) Sit to Supine: Supervision/Verbal cueing Transfers Sit to Stand: Minimal Assistance - Patient > 75% Stand to Sit: Minimal Assistance - Patient > 75%  Trunk/Postural Assessment  Cervical Assessment Cervical Assessment: Exceptions to [facility] (forward head) Thoracic Assessment Thoracic Assessment: Exceptions to [facility] (rounded shoulders) Lumbar Assessment Lumbar Assessment: Exceptions to [facility] (posterior pelvic tilt) Postural Control Postural Control: Deficits on evaluation Trunk Control: decreased with impaired midline orientation and posterior lean bias; significant improvement from initial eval  Balance Balance Balance Assessed: Yes Static  Sitting Balance Static Sitting - Balance Support: Feet supported Static Sitting - Level of Assistance: 5: Stand by assistance Dynamic Sitting Balance Dynamic Sitting - Balance Support: Feet supported Dynamic Sitting - Level of Assistance: 5: Stand by assistance Static Standing Balance Static Standing - Balance Support: During functional activity;Right upper extremity supported Static Standing - Level of Assistance: 4: Min assist Dynamic Standing Balance Dynamic Standing - Balance Support: Right upper extremity supported Dynamic Standing - Level of Assistance: 4: Min assist Extremity/Trunk Assessment RUE Assessment RUE Assessment: Within Functional Limits LUE Assessment LUE Assessment: Exceptions to [facility] General Strength Comments: Ulnar drift, mild tone noted during functional reach, grip 5/5, strength 5/5 overall   Clide Deutscher 03/12/2023, 1:02 PM

## 2023-03-13 NOTE — Progress Notes (Signed)
Occupational Therapy Session Note  Patient Details  Name: Kathleen Figueroa MRN: [id number] Date of Birth: 08/05/1935  {CHL IP REHAB OT TIME CALCULATIONS:304400400}   Short Term Goals: Week 3:  OT Short Term Goal 1 (Week 3): Pt will integrate LUE into functional tasks with Mod multimodal cuing. OT Short Term Goal 2 (Week 3): Pt will maintain sitting balance EOB/EOM in preparation for functional transfers with supervision OT Short Term Goal 3 (Week 3): Pt will complete toilet transfer with min A consistently OT Short Term Goal 4 (Week 3): Pt will complete toileting with mod A consistently  Skilled Therapeutic Interventions/Progress Updates:  Pt greeted *** for skilled OT session with focus on ***.   Pain: Pt reported ***/10 pain, stating "***" in reference to ***. OT offering intermediate rest breaks and positioning suggestions throughout session to address pain/fatigue and maximize participation/safety in session.   Functional Transfers:  Self Care Tasks:  Therapeutic Activities:  Therapeutic Exercise:   Education:  Pt remained *** with 4Ps assessed and immediate needs met. Pt continues to be appropriate for skilled OT intervention to promote further functional independence in ADLs/IADLs.   Therapy Documentation Precautions:  Precautions Precautions: Fall, Other (comment) Precaution Comments: L hemiparesis, L visual field cut, L inattention, significant L hemisensory loss Restrictions Weight Bearing Restrictions Per Provider Order: No   Therapy/Group: Individual Therapy  Lou Cal, OTR/L, MSOT  03/13/2023, 8:12 PM

## 2023-03-13 NOTE — Progress Notes (Signed)
PROGRESS NOTE   Subjective/Complaints:  Pt doing well, slept well, denies pain, LBM yesterday but pt thought it was two days ago; urinating ok but with incontinence. Denies any other complaints or concerns today.   ROS-denies chest pain or shortness of breath.  Denies abdominal pain, n/v/d.    Objective:   No results found.   No results for input(s): "WBC", "HGB", "HCT", "PLT" in the last 72 hours.    Recent Labs    03/11/23 0537  NA 139  K 4.2  CL 105  CO2 25  GLUCOSE 109*  BUN 22  CREATININE 1.07*  CALCIUM 8.6*      Intake/Output Summary (Last 24 hours) at 03/13/2023 1229 Last data filed at 03/13/2023 0740 Gross per 24 hour  Intake 358 ml  Output --  Net 358 ml         Physical Exam: Vital Signs Blood pressure (!) 128/48, pulse 63, temperature 98 F (36.7 C), temperature source Oral, resp. rate 18, height 5\' 6"  (1.676 m), weight 64.2 kg, SpO2 100%.  General: No acute distress, resting in bed, comfortable, working with SLP Mood and affect are appropriate, cheerful Heart: Regular rate and rhythm no rubs murmurs or extra sounds Lungs: Clear to auscultation, breathing unlabored, no rales or wheezes Abdomen: Positive bowel sounds, soft nontender to palpation, nondistended Extremities: No clubbing, cyanosis, or edema Skin: No evidence of breakdown, no evidence of rash over exposed surfaces Neuro: Speech with moderate dysarthria   PRIOR EXAMS: Musculoskeletal: limited Left knee flexion to 90 degrees . 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  Left hemineglect Absent sensation to light touch left upper and lower extremities Musculoskeletal: Right great toe mild tenderness to pinch, onychogryphosis, no tenderness around the nailbed no swelling at the toe or at the MTP no erythema or drainage.  motor strength is 5/5 in right deltoid, bicep, tricep, grip, hip flexor, knee extensors,  ankle dorsiflexor and plantar flexor Left side difficult to examine due to severe sensory ataxia but at least 4/5 Wandering LUE movements  Sensory exam absent sensation to light touch and proprioception in LEFT upper and lower extremities     Assessment/Plan: 1. Functional deficits which require 3+ hours per day of interdisciplinary therapy in a comprehensive inpatient rehab setting. Physiatrist is providing close team supervision and 24 hour management of active medical problems listed below. Physiatrist and rehab team continue to assess barriers to discharge/monitor patient progress toward functional and medical goals  Care Tool:  Bathing    Body parts bathed by patient: Left arm, Chest, Abdomen, Front perineal area, Right upper leg, Left upper leg, Face, Left lower leg, Right lower leg, Right arm   Body parts bathed by helper: Buttocks     Bathing assist Assist Level: Minimal Assistance - Patient > 75%     Upper Body Dressing/Undressing Upper body dressing   What is the patient wearing?: Pull over shirt    Upper body assist Assist Level: Minimal Assistance - Patient > 75%    Lower Body Dressing/Undressing Lower body dressing      What is the patient wearing?: Pants, Underwear/pull up     Lower body assist Assist  for lower body dressing: Moderate Assistance - Patient 50 - 74%     Toileting Toileting    Toileting assist Assist for toileting: Moderate Assistance - Patient 50 - 74%     Transfers Chair/bed transfer  Transfers assist  Chair/bed transfer activity did not occur: Safety/medical concerns (unsafe to get up)  Chair/bed transfer assist level: Minimal Assistance - Patient > 75%     Locomotion Ambulation   Ambulation assist   Ambulation activity did not occur: Safety/medical concerns (requires skilled assistance)  Assist level: 2 helpers Assistive device: Other (comment) (R hallway rail) Max distance: 25'   Walk 10 feet activity   Assist   Walk 10 feet activity did not occur: Safety/medical concerns  Assist level: 2 helpers Assistive device: Other (comment) (hand rail.)   Walk 50 feet activity   Assist Walk 50 feet with 2 turns activity did not occur: Safety/medical concerns  Assist level: 2 helpers      Walk 150 feet activity   Assist Walk 150 feet activity did not occur: Safety/medical concerns         Walk 10 feet on uneven surface  activity   Assist Walk 10 feet on uneven surfaces activity did not occur: Safety/medical concerns         Wheelchair     Assist Is the patient using a wheelchair?: Yes Type of Wheelchair: Manual    Wheelchair assist level: Moderate Assistance - Patient 50 - 74% Max wheelchair distance: 150    Wheelchair 50 feet with 2 turns activity    Assist        Assist Level: Moderate Assistance - Patient 50 - 74%   Wheelchair 150 feet activity     Assist      Assist Level: Moderate Assistance - Patient 50 - 74%   Blood pressure (!) 128/48, pulse 63, temperature 98 F (36.7 C), temperature source Oral, resp. rate 18, height 5\' 6"  (1.676 m), weight 64.2 kg, SpO2 100%.  Medical Problem List and Plan: 1. 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  Antithrombotics: -DVT/anticoagulation:  Pharmaceutical: lovenox 40mg  qd -antiplatelet therapy: Aspirin and Brilinta for three 90 days followed by aspirin alone   -hx left CEA 3. Pain Management: Tylenol as needed   -1/17 gabapentin 100 mg 3 times daily for neuropathic pain-- helping -Severe left knee OA with contracture , pain ok unless knee is flexed to >90deg  4. Mood/Behavior/Sleep: LCSW to evaluate and provide emotional support             -antipsychotic agents: n/a              - Melatonin PRN   5.  Neuropsych/cognition: This patient is not quite capable of making decisions on her own behalf.   6. Skin/Wound Care: Routine skin care checks   7. Fluids/Electrolytes/Nutrition: Routine Is and Os and follow-up chemistries   8: Hypertension: monitor TID and prn (Diovan 320 mg daily at home) On avapro 150mg  will increase to 300mg   1/17 fair control continue current regimen and monitor Restart amlodipine , low dose 2.5 mg, improved 1/24 -1/25-26/25 BP a bit variable, recent med change, monitor for now Increase amlodipine to 10mg  ,  -  03/13/23 BPs ok, a little better, monitor with recent change.  Vitals:   03/09/23 2208 03/10/23 0533 03/10/23 2109 03/11/23 0445  BP: (!) 155/80 (!) 146/66 (!) 152/78 136/78   03/11/23 1315 03/11/23 1851 03/11/23 2038 03/12/23 0423  BP: 118/65 (!) 147/75 (!) 140/69 (!) 152/74   03/12/23 0856 03/12/23 1314 03/12/23 2020 03/13/23 0405  BP: 129/62 (!) 143/69 (!) 141/79 (!) 128/48   9: Hyperlipidemia: continue atorvastatin 80mg  daily   10: DM-2: A1c = 6.4% on October, 2024 (no home meds)  -1/16 glucose stable 127 on BMP 1/13  11: Elevated serum creatinine/GFR ~55: ? Mild CKD; near baseline ~1.0        -Continue to encourage and monitor fluid intake Still with mildly elevated BUN/Creat enc fluids try  off ARB Enc fluids  Recheck in am-- improved 1/30      Latest Ref Rng & Units 03/11/2023    5:37 AM 03/08/2023    5:48 AM 03/01/2023    5:08 AM  BMP  Glucose 70 - 99 mg/dL 604  540  981   BUN 8 - 23 mg/dL 22  27  26    Creatinine 0.44 - 1.00 mg/dL 1.91  4.78  2.95   Sodium 135 - 145 mmol/L 139  135  136   Potassium 3.5 - 5.1 mmol/L 4.2  4.2  4.3   Chloride 98 - 111 mmol/L 105  104  105   CO2 22 - 32 mmol/L 25  22  22    Calcium 8.9 - 10.3 mg/dL 8.6  8.6  8.9     12. Eye redness improved   -Artificial tears as needed ordered, continue to monitor  13. Constipation -Glycolax and senna >> now only miralax BID -Type 5 large BM this am , continent -03/13/23  LBM yesterday, monitor  14.  Right great toe pain appears to be intermittent or may be associated with shoe wear, exam is negative except mild tenderness to pinch , no history of trauma or evidence of trauma she had a similar episode of left great toe pain yesterday or the day before with out physical exam findings.  Will continue to monitor and use as needed Tylenol   LOS: 23 days A FACE TO FACE EVALUATION WAS PERFORMED  [address] 03/13/2023, 12:29 PM

## 2023-03-13 NOTE — Plan of Care (Signed)
  Problem: Consults Goal: RH STROKE PATIENT EDUCATION Description: See Patient Education module for education specifics  Outcome: Progressing   Problem: RH BOWEL ELIMINATION Goal: RH STG MANAGE BOWEL WITH ASSISTANCE Description: STG Manage Bowel with MOD I Assistance. Outcome: Progressing Goal: RH STG MANAGE BOWEL W/MEDICATION W/ASSISTANCE Description: STG Manage Bowel with Medication with MOD I Assistance. Outcome: Progressing   Problem: RH BLADDER ELIMINATION Goal: RH STG MANAGE BLADDER WITH ASSISTANCE Description: STG Manage Bladder With toileting Assistance Outcome: Progressing   Problem: RH SAFETY Goal: RH STG ADHERE TO SAFETY PRECAUTIONS W/ASSISTANCE/DEVICE Description: STG Adhere to Safety Precautions With Cues  Outcome: Progressing   Problem: RH KNOWLEDGE DEFICIT Goal: RH STG INCREASE KNOWLEDGE OF DIABETES Description: Patient and daughter will be able to manage independently with educational resources provided Outcome: Progressing Goal: RH STG INCREASE KNOWLEDGE OF HYPERTENSION Outcome: Progressing Goal: RH STG INCREASE KNOWLEGDE OF HYPERLIPIDEMIA Description: Patient and daughter will be able to manage independently with educational resources provided Outcome: Progressing Goal: RH STG INCREASE KNOWLEDGE OF STROKE PROPHYLAXIS Description: Patient and daughter will be able to manage independently with educational resources provided Outcome: Progressing   Problem: RH Vision Goal: RH LTG Vision (Specify) Outcome: Progressing

## 2023-03-13 NOTE — Progress Notes (Incomplete)
PROGRESS NOTE   Subjective/Complaints:    ROS-denies chest pain or shortness of breath.  Denies abdominal pain, n/v/d.    Objective:   No results found.   No results for input(s): "WBC", "HGB", "HCT", "PLT" in the last 72 hours.    Recent Labs    03/11/23 0537  NA 139  K 4.2  CL 105  CO2 25  GLUCOSE 109*  BUN 22  CREATININE 1.07*  CALCIUM 8.6*      Intake/Output Summary (Last 24 hours) at 03/13/2023 1037 Last data filed at 03/13/2023 0740 Gross per 24 hour  Intake 594 ml  Output --  Net 594 ml         Physical Exam: Vital Signs Blood pressure (!) 128/48, pulse 63, temperature 98 F (36.7 C), temperature source Oral, resp. rate 18, height 5\' 6"  (1.676 m), weight 64.2 kg, SpO2 100%.  General: No acute distress, resting in bed, comfortable Mood and affect are appropriate Heart: Regular rate and rhythm no rubs murmurs or extra sounds Lungs: Clear to auscultation, breathing unlabored, no rales or wheezes Abdomen: Positive bowel sounds, soft nontender to palpation, nondistended Extremities: No clubbing, cyanosis, or edema Skin: No evidence of breakdown, no evidence of rash over exposed surfaces Neuro: Speech with moderate dysarthria    Musculoskeletal: limited Left knee flexion to 90 degrees . 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  Left hemineglect Absent sensation to light touch left upper and lower extremities Musculoskeletal: Right great toe mild tenderness to pinch, onychogryphosis, no tenderness around the nailbed no swelling at the toe or at the MTP no erythema or drainage.  motor strength is 5/5 in right deltoid, bicep, tricep, grip, hip flexor, knee extensors, ankle dorsiflexor and plantar flexor Left side difficult to examine due to severe sensory ataxia but at least 4/5 Wandering LUE movements  Sensory exam absent sensation to light touch and proprioception in LEFT  upper and lower extremities     Assessment/Plan: 1. Functional deficits which require 3+ hours per day of interdisciplinary therapy in a comprehensive inpatient rehab setting. Physiatrist is providing close team supervision and 24 hour management of active medical problems listed below. Physiatrist and rehab team continue to assess barriers to discharge/monitor patient progress toward functional and medical goals  Care Tool:  Bathing    Body parts bathed by patient: Left arm, Chest, Abdomen, Front perineal area, Right upper leg, Left upper leg, Face, Left lower leg, Right lower leg, Right arm   Body parts bathed by helper: Buttocks     Bathing assist Assist Level: Minimal Assistance - Patient > 75%     Upper Body Dressing/Undressing Upper body dressing   What is the patient wearing?: Pull over shirt    Upper body assist Assist Level: Minimal Assistance - Patient > 75%    Lower Body Dressing/Undressing Lower body dressing      What is the patient wearing?: Pants, Underwear/pull up     Lower body assist Assist for lower body dressing: Moderate Assistance - Patient 50 - 74%     Toileting Toileting    Toileting assist Assist for toileting: Moderate Assistance - Patient 50 - 74%  Transfers Chair/bed transfer  Transfers assist  Chair/bed transfer activity did not occur: Safety/medical concerns (unsafe to get up)  Chair/bed transfer assist level: Minimal Assistance - Patient > 75%     Locomotion Ambulation   Ambulation assist   Ambulation activity did not occur: Safety/medical concerns (requires skilled assistance)  Assist level: 2 helpers Assistive device: Other (comment) (R hallway rail) Max distance: 25'   Walk 10 feet activity   Assist  Walk 10 feet activity did not occur: Safety/medical concerns  Assist level: 2 helpers Assistive device: Other (comment) (hand rail.)   Walk 50 feet activity   Assist Walk 50 feet with 2 turns activity did  not occur: Safety/medical concerns  Assist level: 2 helpers      Walk 150 feet activity   Assist Walk 150 feet activity did not occur: Safety/medical concerns         Walk 10 feet on uneven surface  activity   Assist Walk 10 feet on uneven surfaces activity did not occur: Safety/medical concerns         Wheelchair     Assist Is the patient using a wheelchair?: Yes Type of Wheelchair: Manual    Wheelchair assist level: Moderate Assistance - Patient 50 - 74% Max wheelchair distance: 150    Wheelchair 50 feet with 2 turns activity    Assist        Assist Level: Moderate Assistance - Patient 50 - 74%   Wheelchair 150 feet activity     Assist      Assist Level: Moderate Assistance - Patient 50 - 74%   Blood pressure (!) 128/48, pulse 63, temperature 98 F (36.7 C), temperature source Oral, resp. rate 18, height 5\' 6"  (1.676 m), weight 64.2 kg, SpO2 100%.  Medical Problem List and Plan: 1. 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  Antithrombotics: -DVT/anticoagulation:  Pharmaceutical: lovenox 40mg  qd -antiplatelet therapy: Aspirin and Brilinta for three 90 days followed by aspirin alone   -hx left CEA 3. Pain Management: Tylenol as needed   -1/17 gabapentin 100 mg 3 times daily for neuropathic pain-- helping -Severe left knee OA with contracture , pain ok unless knee is flexed to >90deg  4. Mood/Behavior/Sleep: LCSW to evaluate and provide emotional support             -antipsychotic agents: n/a              - Melatonin PRN   5. Neuropsych/cognition: This patient is not quite capable of making decisions on her own behalf.   6. Skin/Wound Care: Routine skin care checks   7. Fluids/Electrolytes/Nutrition: Routine Is and Os and follow-up  chemistries   8: Hypertension: monitor TID and prn (Diovan 320 mg daily at home) On avapro 150mg  will increase to 300mg   1/17 fair control continue current regimen and monitor Restart amlodipine , low dose 2.5 mg, improved 1/24 -1/25-26/25 BP a bit variable, recent med change, monitor for now Vitals:   03/09/23 2208 03/10/23 0533 03/10/23 2109 03/11/23 0445  BP: (!) 155/80 (!) 146/66 (!) 152/78 136/78   03/11/23 1315 03/11/23 1851 03/11/23 2038 03/12/23 0423  BP: 118/65 (!) 147/75 (!) 140/69 (!) 152/74   03/12/23  4098 03/12/23 1314 03/12/23 2020 03/13/23 0405  BP: 129/62 (!) 143/69 (!) 141/79 (!) 128/48  Increase amlodipine to 10mg  , now controlled  9: Hyperlipidemia: continue atorvastatin 80mg  daily   10: DM-2: A1c = 6.4% on October, 2024 (no home meds)  -1/16 glucose stable 127 on BMP 1/13  11: Elevated serum creatinine/GFR ~55: ? Mild CKD; near baseline ~1.0        -Continue to encourage and monitor fluid intake       Latest Ref Rng & Units 03/11/2023    5:37 AM 03/08/2023    5:48 AM 03/01/2023    5:08 AM  BMP  Glucose 70 - 99 mg/dL 119  147  829   BUN 8 - 23 mg/dL 22  27  26    Creatinine 0.44 - 1.00 mg/dL 5.62  1.30  8.65   Sodium 135 - 145 mmol/L 139  135  136   Potassium 3.5 - 5.1 mmol/L 4.2  4.2  4.3   Chloride 98 - 111 mmol/L 105  104  105   CO2 22 - 32 mmol/L 25  22  22    Calcium 8.9 - 10.3 mg/dL 8.6  8.6  8.9   Improved off ARB  12. Eye redness improved   -Artificial tears as needed ordered, continue to monitor  13. Constipation - Glycolax and senna  Type 5 large BM this am , continent 14.  Right great toe pain appears to be intermittent or may be associated with shoe wear, exam is negative except mild tenderness to pinch , no history of trauma or evidence of trauma she had a similar episode of left great toe pain yesterday or the day before with out physical exam findings.  Will continue to monitor and use as needed Tylenol LOS: 23 days A FACE TO FACE  EVALUATION WAS PERFORMED  Erick Colace 03/13/2023, 10:37 AM

## 2023-03-13 NOTE — Progress Notes (Signed)
Speech Language Pathology Discharge Summary  Patient Details  Name: Kathleen Figueroa MRN: [id number] Date of Birth: [birth year]  Date of Discharge from SLP service:March 13, 2023  Today's Date: 03/13/2023 SLP Individual Time: 0901-1000 SLP Individual Time Calculation (min): 59 min   Skilled Therapeutic Interventions:   Skilled therapy session focused on dysphagia and cognitive goals. SLP faciliated session by providing mod i A for patient to consume D3/thin liquid textures. Patient demonstrated timely mastication and adequate oral clearance. X1 throat clear observed after liquids which may indicate bolus misdirection. Of note, MBS completed during inpatient stay which recommended D3/thin. Recommend continuation of current diet and full supervision due to visual deficits. SLP targeted cognitive goals through re-evaluation of memory and problem solving skills. Patient independently recalled 3/4 memory strategies and utilized them to recall 3/4 words with a delay. Patient with independent use of strategies to remember care team names outside of ST sessions. SLP targeted problem solving through verbal scenarios. Patient correctly answered verbal situations witih supervision A. Patient left in bed with alarm set and call bell in reach. Continue POC.   Patient has met 3 of 4 long term goals.  Patient to discharge at overall Supervision;Min level.  Reasons goals not met: continues to require minA for sustained attention to L   Clinical Impression/Discharge Summary:  Pt has made good gains and has met 3 of 4 LTG's this admission due to improved cognition and dysphagia. Pt is currently an overall supervisionA for cognitive tasks such as basic verbal problem solving (may be require more assistance when given visual tasks) and memory. Patient continues to require mod I-supervision cues for utilization of swallowing compensatory strategies to minimize overt s/sx of aspiration with D3/thin diet. Patient did not  meet attention goals as she continues to require min A to attend to the L. Family education complete and pt will discharge SNF with 24 hour supervision. Pt would benefit from f/u ST services to maximize cognition in order to maximize functional independence.   Care Partner:  Caregiver Able to Provide Assistance:  (SNF)  Type of Caregiver Assistance: Physical;Cognitive  Recommendation:  24 hour supervision/assistance;Skilled Nursing facility  Rationale for SLP Follow Up: Maximize cognitive function and independence   Equipment: n/a   Reasons for discharge: Discharged from hospital   Patient/Family Agrees with Progress Made and Goals Achieved: Yes    Alekzander Cardell M.A., CF-SLP 03/13/2023, 12:10 PM

## 2023-03-13 NOTE — Progress Notes (Signed)
Physical Therapy Session Note  Patient Details  Name: Kathleen Figueroa MRN: [id number] Date of Birth: 12-07-1935  Today's Date: 03/12/2023 PT Individual Time: 0102-7253 PT Individual Time Calculation (min): 55 min  Short Term Goals: Week 2:  PT Short Term Goal 1 (Week 2): Pt will perform supine<>sit with min A PT Short Term Goal 1 - Progress (Week 2): Met PT Short Term Goal 2 (Week 2): Pt will perform sit<>stands using LRAD with min A consistently PT Short Term Goal 2 - Progress (Week 2): Met (using using stable surface for R UE support; otherwise, mod A) PT Short Term Goal 3 (Week 2): Pt will perform bed<>chair transfers using LRAD with min A PT Short Term Goal 3 - Progress (Week 2): Met (squat pivot with verbal cuing) PT Short Term Goal 4 (Week 2): Pt will ambulate at least 8ft using LRAD with mod A of 1 and +2 assist as needed PT Short Term Goal 4 - Progress (Week 2): Met Week 3:  PT Short Term Goal 1 (Week 3): Pt will perform supine<>sit using hospital bed features as needed with CGA PT Short Term Goal 1 - Progress (Week 3): Met PT Short Term Goal 2 (Week 3): Pt will perform bed<>chair transfers with CGA and mod verbal cuing PT Short Term Goal 2 - Progress (Week 3): Progressing toward goal PT Short Term Goal 3 (Week 3): Pt will perform sit<>stands using LRAD (not hallway rail) with min A PT Short Term Goal 3 - Progress (Week 3): Progressing toward goal PT Short Term Goal 4 (Week 3): Pt will perform w/c propulsion at least 41ft with min A PT Short Term Goal 4 - Progress (Week 3): Progressing toward goal PT Short Term Goal 5 (Week 3): Pt will continue participating in gait training using LRAD with mod A and +2 assist as needed PT Short Term Goal 5 - Progress (Week 3): Met  Skilled Therapeutic Interventions/Progress Updates:  Patient seated upright in [place] TIS w/c on entrance to room. Patient alert and agreeable to PT session. Pt's son and friend in room. Pt able to relate how she  remembers name of therapist and name of rehab tech.   Patient with no pain complaint at start of session.  Therapeutic Activity: Transfers: Pt performed sit<>stand transfers throughout session with MinA and use of HR or MinA with HHA from therapist and +2. Provided vc/ tc for initiating sequence and countdown to rise to stand.   Gait Training:  Pt ambulated 30' x1 at hallway HR with ModA from therapist and +2 for w/c follow. Then ambulates 3' x1/ 5' x1 using HHA with CGA/ MinA to RUE from +2 and ModA from therapist. Pt initially crossover stepping and requires consistent vc to step out to L side but demos very wide BOS. Pt is then able to follow instructions for stepping straight forward without crossover step!! Intermittently has increased knee flexion requiring heavier ModA to maintain stance but is able to correct upright posture with vc to stop, then perform glute squeeze.   On return to room, son relates need for pt to limit time in [place] TIS w/c d/t no headrest available. Related need to call nursing when pt is fatigued in order to return to bed and place bed in chair position.   Patient seated upright in [place] TIS w/c at end of session with brakes locked, belt alarm set, and all needs within reach.   Therapy Documentation Precautions:  Precautions Precautions: Fall, Other (comment) Precaution Comments: L hemiparesis, L  visual field cut, L inattention, L hemisensory loss Restrictions Weight Bearing Restrictions Per Provider Order: No  Pain:     Therapy/Group: Individual Therapy  Loel Dubonnet PT, DPT, CSRS 03/12/2023, 6:31 PM

## 2023-03-13 NOTE — Plan of Care (Signed)
  Problem: RH Attention Goal: LTG Patient will demonstrate this level of attention during functional activites (SLP) Description: LTG:  Patient will will demonstrate this level of attention during functional activites (SLP) Outcome: Not Met (continues to require minA)   Problem: RH Swallowing Goal: LTG Patient will consume least restrictive diet using compensatory strategies with assistance (SLP) Description: LTG:  Patient will consume least restrictive diet using compensatory strategies with assistance (SLP) Outcome: Completed/Met   Problem: RH Problem Solving Goal: LTG Patient will demonstrate problem solving for (SLP) Description: LTG:  Patient will demonstrate problem solving for basic/complex daily situations with cues  (SLP) Outcome: Completed/Met   Problem: RH Memory Goal: LTG Patient will use memory compensatory aids to (SLP) Description: LTG:  Patient will use memory compensatory aids to recall biographical/new, daily complex information with cues (SLP) Outcome: Completed/Met

## 2023-03-13 NOTE — Progress Notes (Signed)
Physical Therapy Session Note  Patient Details  Name: Kathleen Figueroa MRN: [id number] Date of Birth: [birth year]  Today's Date: 03/13/2023 PT Individual Time: 1020-1125 PT Individual Time Calculation (min): 65 min   Short Term Goals: Week 4:  PT Short Term Goal 1 (Week 4): = to LTGs based on ELOS  Skilled Therapeutic Interventions/Progress Updates:    Pt received supine in bed resting, but awake and agreeable to therapy session. Supine>sitting L EOB, HOB elevated and bedrails available but not using, with close supervision and min verbal cuing. Pt does better orientating herself to sit straight on EOB with decreased cuing. Sitting EOB, with primarily supervision and intermittent CGA for safety maintaining trunk control, while doffing dirty shirt with only min verbal cuing for L arm, donning clean shirt with min A for L arm, and threading on pants with mod A for L leg - requires assist to orient and set-up all clothing. Total A to don socks and shoes with pt reporting she can now feel when therapist is doing this!!  Sit>stand from EOB to intermittent R UE support on w/c armrest while puling pants up over hips with skilled min/mod A for balance - pt accidentally moving L LE out from underneath her BOS due to lack of proprioception and increased anxiety with impaired balance requiring verbal cuing and manual facilitation to place L foot back underneath her BOS  - has L lateral lean with anterior vs posterior bias. Pulled pants up over hips with mod A for L side.  R stand pivot to K5 w/c with R UE support on armrest and skilled heavy min A for balance and facilitating pivoting hips fully into seat.   Transported to/from gym in w/c for time management and energy conservation.  Pt demos recall of how to lock/unlock w/c brakes when verbally asked to do so during session given increased time.  Standing balance focusing on improved upright, midline orientation using mirror for visual feedback with skilled  min A for balance - pt continues to report being unable to feel whether her L foot is on the ground - educated pt on utilizing her vision to look and see if her L foot is on the ground rather than trying to move it to get this information because she will not feel the proprioceptive feedback to know the answer.   Gait training ~53ft using +2 R HHA providing mod A for balance with therapist providing skilled mod assist for balance and L LE management. Pt demonstrating the following gait deviations with therapist providing the described cuing and facilitation for improvement:  - requires frequent cuing to "stand up tall" to avoid gradual worsening crouched posture with hip/knee flexion - continues to require heavy manual facilitation to avoid L hip posteriorly rotating during the crouched posture to avoid her whole pelvis/hips turning to the L (therapist facilitating L hip extension and anterior translation during L stance) - requires consistent mod A to ensure safe L LE foot placement during swing to avoid excessive hip adduction/scissoring and avoid risk of ankle inversion rolling - fairly consistent L lateral trunk lean throughout gait requiring assist for balance  Transported back to room and pt agreeable to remain sitting up in K5 wheelchair. Pt left seated with needs in reach, seat belt alarm on, and her son and DIL present. Educated family to sit on her L side to promote visual scanning and attention towards the L.  Therapy Documentation Precautions:  Precautions Precautions: Fall, Other (comment) Precaution Comments: L hemiparesis, L visual  field cut, L inattention, L hemisensory loss Restrictions Weight Bearing Restrictions Per Provider Order: No   Pain:  Continues to state she will have intermittent "aching" pain in L shoulder and L thigh - appears to occur when attempting to move those extremities and does so in an incoordinated fashion. Facilitation to improve extremity positioning  throughout session to manage pain.    Therapy/Group: Individual Therapy  Kathleen Figueroa , PT, DPT, NCS, CSRS 03/13/2023, 7:56 AM

## 2023-03-13 NOTE — Discharge Summary (Signed)
Physical Therapy Discharge Summary  Patient Details  Name: Kathleen Figueroa MRN: [id number] Date of Birth: 05-05-1935  Date of Discharge from PT service:{Time; dates multiple:304500300}  {CHL IP REHAB PT TIME CALCULATION:304800500}   Patient has met {NUMBERS 0-12:18577} of {NUMBERS 0-12:18577} long term goals due to improved activity tolerance, improved balance, improved postural control, increased strength, ability to compensate for deficits, functional use of  left upper extremity and left lower extremity, improved attention, improved awareness, and improved coordination.  Patient to discharge at a wheelchair level skilled Min Assist for squat pivot transfers. Patient's care partner requires assistance to provide the necessary physical and cognitive assistance at discharge and therefore recommending pt continued PT services in SNF setting.  Reasons goals not met: ***  Recommendation:  Patient will benefit from ongoing skilled PT services in skilled nursing facility setting to continue to advance safe functional mobility, address ongoing impairments in ***, and minimize fall risk.  Equipment: No equipment provided, to be provided by SNF  Reasons for discharge: discharge from hospital and needing further PT services in SNF setting  Patient/family agrees with progress made and goals achieved: Yes  PT Discharge Precautions/Restrictions Precautions Precautions: Fall;Other (comment) Precaution Comments: L hemiparesis, L visual field cut, L inattention, significant L hemisensory loss Restrictions Weight Bearing Restrictions Per Provider Order: No Pain Pain Assessment Pain Scale: 0-10 Pain Score: 0-No pain (none at rest, increase up to 8/10 with activity) Pain Location: Shoulder (and thigh/knee) Pain Orientation: Left Pain Descriptors / Indicators: Aching Pain Onset: With Activity (likely when pt moving the arm/leg in incoorect way due to impaired sensation) Patients Stated Pain Goal:  0 Pain Intervention(s): Repositioned;Emotional support;Distraction Pain Interference Pain Interference Pain Effect on Sleep: 1. Rarely or not at all Pain Interference with Therapy Activities: 1. Rarely or not at all Pain Interference with Day-to-Day Activities: 1. Rarely or not at all Vision/Perception  Vision - History Ability to See in Adequate Light: 3 Highly impaired Vision - Assessment Eye Alignment: Within Functional Limits (R gaze preference) Ocular Range of Motion: Other (comment) (L inattention with inconsistent visual scanning towards L but is able to track that direction with max cuing) Alignment/Gaze Preference: Head turned;Gaze right (R gaze preference improved) Tracking/Visual Pursuits: Requires cues, head turns, or add eye shifts to track;Other (comment) (improved visual tracking towards L) Perception Preception Impairment Details: Spatial orientation;Inattention/Neglect Perception-Other Comments: L inattention, however improved from inital eval Praxis Praxis: Impaired Praxis Impairment Details: Initiation;Motor planning;Organization  Cognition Overall Cognitive Status: Impaired/Different from baseline Arousal/Alertness: Awake/alert Orientation Level: Oriented X4 Year: 2025 Month: February Day of Week: Correct Attention: Selective;Sustained;Focused Focused Attention: Appears intact Sustained Attention: Appears intact Selective Attention: Impaired Memory: Impaired Awareness: Impaired Problem Solving: Impaired Safety/Judgment: Impaired Sensation Sensation Light Touch: Impaired Detail Light Touch Impaired Details: Absent LLE;Absent LUE Hot/Cold: Not tested Proprioception: Impaired Detail Proprioception Impaired Details: Impaired LLE;Impaired LUE Stereognosis: Not tested Coordination Gross Motor Movements are Fluid and Coordinated: No Coordination and Movement Description: Significant L hemi-sensory and proprioceptive deficits impacting awareness and  positioning of L UE and L LE in addition to hemiparesis Motor  Motor Motor: Hemiplegia;Motor impersistence;Other (comment);Ataxia;Abnormal postural alignment and control Motor - Discharge Observations: Significant L hemi-sensory and proprioceptive deficits impacting awareness and positioning of L UE and L LE in addition to hemiparesis  Mobility Bed Mobility Bed Mobility: Supine to Sit;Sit to Supine Supine to Sit: Supervision/Verbal cueing (using bed features and verbal cuing) Sit to Supine: Supervision/Verbal cueing (using bed features and verbal cuing) Transfers Transfers: Sit to Stand;Stand to Sit;Squat Pivot  Transfers Sit to Stand: Minimal Assistance - Patient > 75% Stand to Sit: Minimal Assistance - Patient > 75% Squat Pivot Transfers: Minimal Assistance - Patient > 75% Transfer (Assistive device): Other (Comment) (R UE support on stable surface) Locomotion  Gait Ambulation: Yes Gait Assistance: 2 Helpers;Moderate Assistance - Patient 50-74% (skilled assistance only) Gait Distance (Feet): 100 Feet (up to) Assistive device: 2 person hand held assist Gait Assistance Details: Verbal cues for technique;Verbal cues for sequencing;Verbal cues for gait pattern;Verbal cues for safe use of DME/AE;Manual facilitation for weight shifting;Manual facilitation for placement Gait Gait: Yes Gait Pattern: Impaired Gait Pattern: Poor foot clearance - left;Lateral trunk lean to left;Ataxic;Scissoring;Decreased step length - right;Decreased step length - left;Decreased stance time - left;Step-through pattern;Step-to pattern;Decreased stride length;Decreased hip/knee flexion - left;Decreased dorsiflexion - left (sensory ataxia in L LE causing significant incoordination) Gait velocity: decreased Stairs / Additional Locomotion Stairs: No Wheelchair Mobility Wheelchair Mobility: Yes (***)  Trunk/Postural Assessment  Cervical Assessment Cervical Assessment: Exceptions to [facility] (forward head with R  rotation bias) Thoracic Assessment Thoracic Assessment: Exceptions to [facility] (rounded shoulders) Lumbar Assessment Lumbar Assessment: Exceptions to [facility] (posterior pelvic tilt) Postural Control Postural Control: Deficits on evaluation Trunk Control: adequat for simple ADL tasks sitting EOB with supervision to occasional min A; significantly improvement from initial eval  Balance Balance Balance Assessed: Yes Static Sitting Balance Static Sitting - Balance Support: Feet supported Static Sitting - Level of Assistance: 5: Stand by assistance Dynamic Sitting Balance Dynamic Sitting - Balance Support: Feet supported;Right upper extremity supported Dynamic Sitting - Level of Assistance: 5: Stand by assistance;Other (comment) (CGA) Static Standing Balance Static Standing - Balance Support: During functional activity;Right upper extremity supported Static Standing - Level of Assistance: 4: Min assist Dynamic Standing Balance Dynamic Standing - Balance Support: During functional activity;Right upper extremity supported Dynamic Standing - Level of Assistance: 3: Mod assist;4: Min assist Extremity Assessment      RLE Assessment RLE Assessment: Within Functional Limits Active Range of Motion (AROM) Comments: WFL/WNL RLE Strength Right Hip Flexion: 4+/5 Right Knee Flexion: 5/5 Right Knee Extension: 5/5 Right Ankle Dorsiflexion: 5/5 Right Ankle Plantar Flexion: 5/5 LLE Assessment LLE Assessment: Exceptions to [facility] Passive Range of Motion (PROM) Comments: decreased knee flexion ROM due to significant OA General Strength Comments: generally assessed due to difficulty following motor commands and incoordation from impaired sensation LLE Strength Left Hip Flexion: 3+/5 Left Knee Flexion: 3+/5 (through available ROM) Left Knee Extension: 4/5 Left Ankle Dorsiflexion: 3+/5 Left Ankle Plantar Flexion: 4-/5   Carly M Pippin , PT, DPT, NCS, CSRS 03/13/2023, 11:05 AM

## 2023-03-14 NOTE — Plan of Care (Signed)
  Problem: RH Ambulation Goal: LTG Patient will ambulate in home environment (PT) Description: LTG: Patient will ambulate in home environment, # of feet with assistance (PT). Outcome: Adequate for Discharge   Problem: Consults Goal: RH STROKE PATIENT EDUCATION Description: See Patient Education module for education specifics  Outcome: Completed/Met   Problem: RH BOWEL ELIMINATION Goal: RH STG MANAGE BOWEL WITH ASSISTANCE Description: STG Manage Bowel with MOD I Assistance. Outcome: Completed/Met Goal: RH STG MANAGE BOWEL W/MEDICATION W/ASSISTANCE Description: STG Manage Bowel with Medication with MOD I Assistance. Outcome: Completed/Met   Problem: RH BLADDER ELIMINATION Goal: RH STG MANAGE BLADDER WITH ASSISTANCE Description: STG Manage Bladder With toileting Assistance Outcome: Completed/Met   Problem: RH SAFETY Goal: RH STG ADHERE TO SAFETY PRECAUTIONS W/ASSISTANCE/DEVICE Description: STG Adhere to Safety Precautions With Cues  Outcome: Completed/Met   Problem: RH KNOWLEDGE DEFICIT Goal: RH STG INCREASE KNOWLEDGE OF DIABETES Description: Patient and daughter will be able to manage independently with educational resources provided Outcome: Completed/Met Goal: RH STG INCREASE KNOWLEDGE OF HYPERTENSION Outcome: Completed/Met Goal: RH STG INCREASE KNOWLEGDE OF HYPERLIPIDEMIA Description: Patient and daughter will be able to manage independently with educational resources provided Outcome: Completed/Met Goal: RH STG INCREASE KNOWLEDGE OF STROKE PROPHYLAXIS Description: Patient and daughter will be able to manage independently with educational resources provided Outcome: Completed/Met   Problem: RH Vision Goal: RH LTG Vision (Specify) Outcome: Completed/Met   Problem: RH Balance Goal: LTG Patient will maintain dynamic sitting balance (PT) Description: LTG:  Patient will maintain dynamic sitting balance with assistance during mobility activities (PT) Outcome:  Completed/Met Goal: LTG Patient will maintain dynamic standing balance (PT) Description: LTG:  Patient will maintain dynamic standing balance with assistance during mobility activities (PT) Outcome: Completed/Met   Problem: Sit to Stand Goal: LTG:  Patient will perform sit to stand with assistance level (PT) Description: LTG:  Patient will perform sit to stand with assistance level (PT) Outcome: Completed/Met   Problem: RH Bed Mobility Goal: LTG Patient will perform bed mobility with assist (PT) Description: LTG: Patient will perform bed mobility with assistance, with/without cues (PT). Outcome: Completed/Met   Problem: RH Bed to Chair Transfers Goal: LTG Patient will perform bed/chair transfers w/assist (PT) Description: LTG: Patient will perform bed to chair transfers with assistance (PT). Outcome: Completed/Met   Problem: RH Car Transfers Goal: LTG Patient will perform car transfers with assist (PT) Description: LTG: Patient will perform car transfers with assistance (PT). Outcome: Completed/Met   Problem: RH Ambulation Goal: LTG Patient will ambulate in controlled environment (PT) Description: LTG: Patient will ambulate in a controlled environment, # of feet with assistance (PT). Outcome: Completed/Met   Problem: RH Wheelchair Mobility Goal: LTG Patient will propel w/c in controlled environment (PT) Description: LTG: Patient will propel wheelchair in controlled environment, # of feet with assist (PT) Outcome: Completed/Met Goal: LTG Patient will propel w/c in home environment (PT) Description: LTG: Patient will propel wheelchair in home environment, # of feet with assistance (PT). Outcome: Completed/Met

## 2023-03-14 NOTE — Progress Notes (Incomplete)
Inpatient Rehabilitation Disharge Medication Review by a Pharmacist  A complete drug regimen review was completed for this patient to identify any potential clinically significant medication issues.  High Risk Drug Classes Is patient taking? Indication by Medication  Antipsychotic No   Anticoagulant No   Antibiotic No   Opioid No   Antiplatelet Yes Aspirin 81 mg and ticagrelor through 05/12/23 then asprin alone  Hypoglycemics/insulin No   Vasoactive Medication Yes Amlodipine - HTN  Chemotherapy No   Other Yes Atorvastatin - hyperlipidemia Gabapentin - neuropathy Robaxin - spasms     Type of Medication Issue Identified Description of Issue Recommendation(s)  Drug Interaction(s) (clinically significant)     Duplicate Therapy     Allergy     No Medication Administration End Date     Incorrect Dose     Additional Drug Therapy Needed     Significant med changes from prior encounter (inform family/care partners about these prior to discharge). Clopidogrel changed to Ticagrelor. New atorvastatin, aspirin 81 mg. Communicate changes with patient/family prior to discharge.  Other       Clinically significant medication issues were identified that warrant physician communication and completion of prescribed/recommended actions by midnight of the next day:  No  Pharmacist comments:    Time spent performing this drug regimen review (minutes):  20  Ulyses Southward, PharmD, Stem, AAHIVP, CPP Infectious Disease Pharmacist 03/14/2023 1:58 PM

## 2023-03-14 NOTE — Plan of Care (Signed)
Please disregard home ambulation "adequate for discharge" as entered in error. Primary PT discontinued home ambulation goal on 1/30.  Problem: RH Ambulation Goal: LTG Patient will ambulate in home environment (PT) Description: LTG: Patient will ambulate in home environment, # of feet with assistance (PT). 03/14/2023 1517 by Ambrose Finland, PT Outcome: Not Applicable

## 2023-03-14 NOTE — Plan of Care (Signed)
  Problem: RH Balance Goal: LTG: Patient will maintain dynamic sitting balance (OT) Description: LTG:  Patient will maintain dynamic sitting balance with assistance during activities of daily living (OT) Outcome: Completed/Met Goal: LTG Patient will maintain dynamic standing with ADLs (OT) Description: LTG:  Patient will maintain dynamic standing balance with assist during activities of daily living (OT)  Outcome: Completed/Met   Problem: Sit to Stand Goal: LTG:  Patient will perform sit to stand in prep for activites of daily living with assistance level (OT) Description: LTG:  Patient will perform sit to stand in prep for activites of daily living with assistance level (OT) Outcome: Adequate for Discharge   Problem: RH Eating Goal: LTG Patient will perform eating w/assist, cues/equip (OT) Description: LTG: Patient will perform eating with assist, with/without cues using equipment (OT) Outcome: Completed/Met   Problem: RH Grooming Goal: LTG Patient will perform grooming w/assist,cues/equip (OT) Description: LTG: Patient will perform grooming with assist, with/without cues using equipment (OT) Outcome: Completed/Met   Problem: RH Bathing Goal: LTG Patient will bathe all body parts with assist levels (OT) Description: LTG: Patient will bathe all body parts with assist levels (OT) Outcome: Completed/Met   Problem: RH Dressing Goal: LTG Patient will perform upper body dressing (OT) Description: LTG Patient will perform upper body dressing with assist, with/without cues (OT). Outcome: Completed/Met Goal: LTG Patient will perform lower body dressing w/assist (OT) Description: LTG: Patient will perform lower body dressing with assist, with/without cues in positioning using equipment (OT) Outcome: Adequate for Discharge Note: DC at Mod A level.    Problem: RH Toileting Goal: LTG Patient will perform toileting task (3/3 steps) with assistance level (OT) Description: LTG: Patient will  perform toileting task (3/3 steps) with assistance level (OT)  Outcome: Adequate for Discharge   Problem: RH Vision Goal: RH LTG Vision (Specify) Outcome: Adequate for Discharge   Problem: RH Functional Use of Upper Extremity Goal: LTG Patient will use RT/LT upper extremity as a (OT) Description: LTG: Patient will use right/left upper extremity as a stabilizer/gross assist/diminished/nondominant/dominant level with assist, with/without cues during functional activity (OT) Outcome: Adequate for Discharge   Problem: RH Toilet Transfers Goal: LTG Patient will perform toilet transfers w/assist (OT) Description: LTG: Patient will perform toilet transfers with assist, with/without cues using equipment (OT) Outcome: Adequate for Discharge   Problem: RH Tub/Shower Transfers Goal: LTG Patient will perform tub/shower transfers w/assist (OT) Description: LTG: Patient will perform tub/shower transfers with assist, with/without cues using equipment (OT) Outcome: Completed/Met   Problem: RH Memory Goal: LTG Patient will demonstrate ability for day to day recall/carry over during activities of daily living with assistance level (OT) Description: LTG:  Patient will demonstrate ability for day to day recall/carry over during activities of daily living with assistance level (OT). Outcome: Completed/Met

## 2023-03-14 NOTE — Plan of Care (Signed)
  Problem: Consults Goal: RH STROKE PATIENT EDUCATION Description: See Patient Education module for education specifics  Outcome: Progressing   Problem: RH BOWEL ELIMINATION Goal: RH STG MANAGE BOWEL WITH ASSISTANCE Description: STG Manage Bowel with MOD I Assistance. Outcome: Progressing Goal: RH STG MANAGE BOWEL W/MEDICATION W/ASSISTANCE Description: STG Manage Bowel with Medication with MOD I Assistance. Outcome: Progressing   Problem: RH BLADDER ELIMINATION Goal: RH STG MANAGE BLADDER WITH ASSISTANCE Description: STG Manage Bladder With toileting Assistance Outcome: Progressing   Problem: RH SAFETY Goal: RH STG ADHERE TO SAFETY PRECAUTIONS W/ASSISTANCE/DEVICE Description: STG Adhere to Safety Precautions With Cues  Outcome: Progressing   Problem: RH KNOWLEDGE DEFICIT Goal: RH STG INCREASE KNOWLEDGE OF DIABETES Description: Patient and daughter will be able to manage independently with educational resources provided Outcome: Progressing Goal: RH STG INCREASE KNOWLEDGE OF HYPERTENSION Outcome: Progressing Goal: RH STG INCREASE KNOWLEGDE OF HYPERLIPIDEMIA Description: Patient and daughter will be able to manage independently with educational resources provided Outcome: Progressing Goal: RH STG INCREASE KNOWLEDGE OF STROKE PROPHYLAXIS Description: Patient and daughter will be able to manage independently with educational resources provided Outcome: Progressing   Problem: RH Vision Goal: RH LTG Vision (Specify) Outcome: Progressing

## 2023-03-14 NOTE — Progress Notes (Signed)
PROGRESS NOTE   Subjective/Complaints:  Pt doing well again, slept well, denies pain, LBM yesterday; urinating ok but with incontinence. Denies any other complaints or concerns today.   ROS-denies chest pain or shortness of breath.  Denies abdominal pain, n/v/d.    Objective:   No results found.   No results for input(s): "WBC", "HGB", "HCT", "PLT" in the last 72 hours.    No results for input(s): "NA", "K", "CL", "CO2", "GLUCOSE", "BUN", "CREATININE", "CALCIUM" in the last 72 hours.     Intake/Output Summary (Last 24 hours) at 03/14/2023 1054 Last data filed at 03/13/2023 1833 Gross per 24 hour  Intake 477 ml  Output --  Net 477 ml         Physical Exam: Vital Signs Blood pressure 126/65, pulse 71, temperature 98.1 F (36.7 C), temperature source Oral, resp. rate 18, height 5\' 6"  (1.676 m), weight 64.2 kg, SpO2 97%.  General: No acute distress, resting in bed, comfortable Mood and affect are appropriate, cheerful Heart: Regular rate and rhythm no rubs murmurs or extra sounds Lungs: Clear to auscultation, breathing unlabored, no rales or wheezes Abdomen: Positive bowel sounds, soft nontender to palpation, nondistended Extremities: No clubbing, cyanosis, or edema Skin: No evidence of breakdown, no evidence of rash over exposed surfaces Neuro: Speech with moderate dysarthria   PRIOR EXAMS: Musculoskeletal: limited Left knee flexion to 90 degrees . 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  Left hemineglect Absent sensation to light touch left upper and lower extremities Musculoskeletal: Right great toe mild tenderness to pinch, onychogryphosis, no tenderness around the nailbed no swelling at the toe or at the MTP no erythema or drainage.  motor strength is 5/5 in right deltoid, bicep, tricep, grip, hip flexor, knee extensors, ankle dorsiflexor and plantar flexor Left side difficult to  examine due to severe sensory ataxia but at least 4/5 Wandering LUE movements  Sensory exam absent sensation to light touch and proprioception in LEFT upper and lower extremities     Assessment/Plan: 1. Functional deficits which require 3+ hours per day of interdisciplinary therapy in a comprehensive inpatient rehab setting. Physiatrist is providing close team supervision and 24 hour management of active medical problems listed below. Physiatrist and rehab team continue to assess barriers to discharge/monitor patient progress toward functional and medical goals  Care Tool:  Bathing    Body parts bathed by patient: Left arm, Chest, Abdomen, Front perineal area, Right upper leg, Left upper leg, Face, Left lower leg, Right lower leg, Right arm   Body parts bathed by helper: Buttocks     Bathing assist Assist Level: Minimal Assistance - Patient > 75%     Upper Body Dressing/Undressing Upper body dressing   What is the patient wearing?: Pull over shirt    Upper body assist Assist Level: Minimal Assistance - Patient > 75%    Lower Body Dressing/Undressing Lower body dressing      What is the patient wearing?: Pants, Underwear/pull up     Lower body assist Assist for lower body dressing: Moderate Assistance - Patient 50 - 74%     Toileting Toileting    Toileting assist Assist for toileting: Moderate  Assistance - Patient 50 - 74%     Transfers Chair/bed transfer  Transfers assist  Chair/bed transfer activity did not occur: Safety/medical concerns (unsafe to get up)  Chair/bed transfer assist level: Minimal Assistance - Patient > 75%     Locomotion Ambulation   Ambulation assist   Ambulation activity did not occur: Safety/medical concerns (requires skilled assistance)  Assist level: 2 helpers Assistive device: Other (comment) (R hallway rail) Max distance: 25'   Walk 10 feet activity   Assist  Walk 10 feet activity did not occur: Safety/medical  concerns  Assist level: 2 helpers Assistive device: Other (comment) (hand rail.)   Walk 50 feet activity   Assist Walk 50 feet with 2 turns activity did not occur: Safety/medical concerns  Assist level: 2 helpers      Walk 150 feet activity   Assist Walk 150 feet activity did not occur: Safety/medical concerns         Walk 10 feet on uneven surface  activity   Assist Walk 10 feet on uneven surfaces activity did not occur: Safety/medical concerns         Wheelchair     Assist Is the patient using a wheelchair?: Yes Type of Wheelchair: Manual    Wheelchair assist level: Moderate Assistance - Patient 50 - 74% Max wheelchair distance: 150    Wheelchair 50 feet with 2 turns activity    Assist        Assist Level: Moderate Assistance - Patient 50 - 74%   Wheelchair 150 feet activity     Assist      Assist Level: Moderate Assistance - Patient 50 - 74%   Blood pressure 126/65, pulse 71, temperature 98.1 F (36.7 C), temperature source Oral, resp. rate 18, height 5\' 6"  (1.676 m), weight 64.2 kg, SpO2 97%.  Medical Problem List and Plan: 1. 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  Antithrombotics: -DVT/anticoagulation:  Pharmaceutical: lovenox 40mg  qd -antiplatelet therapy: Aspirin and Brilinta for three 90 days followed by aspirin alone   -hx left CEA 3. Pain Management: Tylenol as needed   -1/17 gabapentin 100 mg 3 times daily for neuropathic pain-- helping -Severe left knee OA with contracture , pain ok unless knee is flexed to >90deg  4. Mood/Behavior/Sleep: LCSW to evaluate and provide emotional support             -antipsychotic agents: n/a              - Melatonin PRN   5. Neuropsych/cognition: This patient is not quite capable of  making decisions on her own behalf.   6. Skin/Wound Care: Routine skin care checks   7. Fluids/Electrolytes/Nutrition: Routine Is and Os and follow-up chemistries   8: Hypertension: monitor TID and prn (Diovan 320 mg daily at home) On avapro 150mg  will increase to 300mg   1/17 fair control continue current regimen and monitor Restart amlodipine , low dose 2.5 mg, improved 1/24 -1/25-26/25 BP a bit variable, recent med change, monitor for now Increase amlodipine to 10mg  ,  -03/13/23 BPs ok, a little better, monitor with recent change.  -03/14/23 BPs great, cont current regimen Vitals:   03/11/23 0445 03/11/23 1315 03/11/23 1851 03/11/23  2038  BP: 136/78 118/65 (!) 147/75 (!) 140/69   03/12/23 0423 03/12/23 0856 03/12/23 1314 03/12/23 2020  BP: (!) 152/74 129/62 (!) 143/69 (!) 141/79   03/13/23 0405 03/13/23 1346 03/13/23 2022 03/14/23 0418  BP: (!) 128/48 (!) 117/59 132/61 126/65   9: Hyperlipidemia: continue atorvastatin 80mg  daily   10: DM-2: A1c = 6.4% on October, 2024 (no home meds)  -1/16 glucose stable 127 on BMP 1/13  11: Elevated serum creatinine/GFR ~55: ? Mild CKD; near baseline ~1.0        -Continue to encourage and monitor fluid intake Still with mildly elevated BUN/Creat enc fluids try  off ARB Enc fluids  Recheck in am-- improved 1/30      Latest Ref Rng & Units 03/11/2023    5:37 AM 03/08/2023    5:48 AM 03/01/2023    5:08 AM  BMP  Glucose 70 - 99 mg/dL 161  096  045   BUN 8 - 23 mg/dL 22  27  26    Creatinine 0.44 - 1.00 mg/dL 4.09  8.11  9.14   Sodium 135 - 145 mmol/L 139  135  136   Potassium 3.5 - 5.1 mmol/L 4.2  4.2  4.3   Chloride 98 - 111 mmol/L 105  104  105   CO2 22 - 32 mmol/L 25  22  22    Calcium 8.9 - 10.3 mg/dL 8.6  8.6  8.9     12. Eye redness improved   -Artificial tears as needed ordered, continue to monitor  13. Constipation -Glycolax and senna >> now only miralax BID -Type 5 large BM this am , continent -03/14/23 LBM yesterday,  monitor  14.  Right great toe pain appears to be intermittent or may be associated with shoe wear, exam is negative except mild tenderness to pinch , no history of trauma or evidence of trauma she had a similar episode of left great toe pain yesterday or the day before with out physical exam findings.  Will continue to monitor and use as needed Tylenol   LOS: 24 days A FACE TO FACE EVALUATION WAS PERFORMED  [address] 03/14/2023, 10:54 AM

## 2023-03-15 DIAGNOSIS — D649 Anemia, unspecified: Secondary | ICD-10-CM | POA: Diagnosis not present

## 2023-03-15 DIAGNOSIS — I63331 Cerebral infarction due to thrombosis of right posterior cerebral artery: Secondary | ICD-10-CM | POA: Diagnosis not present

## 2023-03-15 DIAGNOSIS — R209 Unspecified disturbances of skin sensation: Secondary | ICD-10-CM | POA: Diagnosis not present

## 2023-03-15 DIAGNOSIS — Z7985 Long-term (current) use of injectable non-insulin antidiabetic drugs: Secondary | ICD-10-CM | POA: Diagnosis not present

## 2023-03-15 DIAGNOSIS — G8194 Hemiplegia, unspecified affecting left nondominant side: Secondary | ICD-10-CM | POA: Diagnosis not present

## 2023-03-15 DIAGNOSIS — G459 Transient cerebral ischemic attack, unspecified: Secondary | ICD-10-CM | POA: Diagnosis not present

## 2023-03-15 DIAGNOSIS — N182 Chronic kidney disease, stage 2 (mild): Secondary | ICD-10-CM | POA: Diagnosis not present

## 2023-03-15 DIAGNOSIS — R2689 Other abnormalities of gait and mobility: Secondary | ICD-10-CM | POA: Diagnosis not present

## 2023-03-15 DIAGNOSIS — R296 Repeated falls: Secondary | ICD-10-CM | POA: Diagnosis not present

## 2023-03-15 DIAGNOSIS — R1312 Dysphagia, oropharyngeal phase: Secondary | ICD-10-CM | POA: Diagnosis not present

## 2023-03-15 DIAGNOSIS — Z9889 Other specified postprocedural states: Secondary | ICD-10-CM | POA: Diagnosis not present

## 2023-03-15 DIAGNOSIS — I69398 Other sequelae of cerebral infarction: Secondary | ICD-10-CM | POA: Diagnosis not present

## 2023-03-15 DIAGNOSIS — M6281 Muscle weakness (generalized): Secondary | ICD-10-CM | POA: Diagnosis not present

## 2023-03-15 DIAGNOSIS — R414 Neurologic neglect syndrome: Secondary | ICD-10-CM | POA: Diagnosis not present

## 2023-03-15 DIAGNOSIS — I130 Hypertensive heart and chronic kidney disease with heart failure and stage 1 through stage 4 chronic kidney disease, or unspecified chronic kidney disease: Secondary | ICD-10-CM | POA: Diagnosis not present

## 2023-03-15 DIAGNOSIS — Z043 Encounter for examination and observation following other accident: Secondary | ICD-10-CM | POA: Diagnosis not present

## 2023-03-15 DIAGNOSIS — R278 Other lack of coordination: Secondary | ICD-10-CM | POA: Diagnosis not present

## 2023-03-15 DIAGNOSIS — I639 Cerebral infarction, unspecified: Secondary | ICD-10-CM | POA: Diagnosis not present

## 2023-03-15 DIAGNOSIS — I10 Essential (primary) hypertension: Secondary | ICD-10-CM | POA: Diagnosis not present

## 2023-03-15 DIAGNOSIS — W19XXXA Unspecified fall, initial encounter: Secondary | ICD-10-CM | POA: Diagnosis not present

## 2023-03-15 DIAGNOSIS — I63531 Cerebral infarction due to unspecified occlusion or stenosis of right posterior cerebral artery: Secondary | ICD-10-CM | POA: Diagnosis not present

## 2023-03-15 DIAGNOSIS — R41841 Cognitive communication deficit: Secondary | ICD-10-CM | POA: Diagnosis not present

## 2023-03-15 DIAGNOSIS — S066XAA Traumatic subarachnoid hemorrhage with loss of consciousness status unknown, initial encounter: Secondary | ICD-10-CM | POA: Diagnosis not present

## 2023-03-15 DIAGNOSIS — J432 Centrilobular emphysema: Secondary | ICD-10-CM | POA: Diagnosis not present

## 2023-03-15 DIAGNOSIS — W06XXXA Fall from bed, initial encounter: Secondary | ICD-10-CM | POA: Diagnosis not present

## 2023-03-15 DIAGNOSIS — S0083XA Contusion of other part of head, initial encounter: Secondary | ICD-10-CM | POA: Diagnosis not present

## 2023-03-15 DIAGNOSIS — R531 Weakness: Secondary | ICD-10-CM | POA: Diagnosis not present

## 2023-03-15 DIAGNOSIS — S0003XA Contusion of scalp, initial encounter: Secondary | ICD-10-CM | POA: Diagnosis not present

## 2023-03-15 DIAGNOSIS — H53462 Homonymous bilateral field defects, left side: Secondary | ICD-10-CM | POA: Diagnosis not present

## 2023-03-15 DIAGNOSIS — I6350 Cerebral infarction due to unspecified occlusion or stenosis of unspecified cerebral artery: Secondary | ICD-10-CM | POA: Diagnosis not present

## 2023-03-15 DIAGNOSIS — E1169 Type 2 diabetes mellitus with other specified complication: Secondary | ICD-10-CM | POA: Diagnosis not present

## 2023-03-15 DIAGNOSIS — S066X0A Traumatic subarachnoid hemorrhage without loss of consciousness, initial encounter: Secondary | ICD-10-CM | POA: Diagnosis not present

## 2023-03-15 DIAGNOSIS — M25512 Pain in left shoulder: Secondary | ICD-10-CM | POA: Diagnosis not present

## 2023-03-15 DIAGNOSIS — E119 Type 2 diabetes mellitus without complications: Secondary | ICD-10-CM | POA: Diagnosis not present

## 2023-03-15 DIAGNOSIS — S0990XA Unspecified injury of head, initial encounter: Secondary | ICD-10-CM | POA: Diagnosis not present

## 2023-03-15 DIAGNOSIS — Z7401 Bed confinement status: Secondary | ICD-10-CM | POA: Diagnosis not present

## 2023-03-15 LAB — CBC
HCT: 36.7 % (ref 36.0–46.0)
Hemoglobin: 11.8 g/dL — ABNORMAL LOW (ref 12.0–15.0)
MCH: 28 pg (ref 26.0–34.0)
MCHC: 32.2 g/dL (ref 30.0–36.0)
MCV: 87.2 fL (ref 80.0–100.0)
Platelets: 204 10*3/uL (ref 150–400)
RBC: 4.21 MIL/uL (ref 3.87–5.11)
RDW: 14.7 % (ref 11.5–15.5)
WBC: 5 10*3/uL (ref 4.0–10.5)
nRBC: 0 % (ref 0.0–0.2)

## 2023-03-15 LAB — BASIC METABOLIC PANEL
Anion gap: 6 (ref 5–15)
BUN: 17 mg/dL (ref 8–23)
CO2: 28 mmol/L (ref 22–32)
Calcium: 9 mg/dL (ref 8.9–10.3)
Chloride: 106 mmol/L (ref 98–111)
Creatinine, Ser: 1.1 mg/dL — ABNORMAL HIGH (ref 0.44–1.00)
GFR, Estimated: 49 mL/min — ABNORMAL LOW (ref >60)
Glucose, Bld: 99 mg/dL (ref 70–99)
Potassium: 4.2 mmol/L (ref 3.5–5.1)
Sodium: 140 mmol/L (ref 135–145)

## 2023-03-15 MED ORDER — GABAPENTIN 100 MG PO CAPS: 200.0000 mg | ORAL_CAPSULE | Freq: Three times a day (TID) | ORAL | Status: DC

## 2023-03-15 MED ORDER — POLYETHYLENE GLYCOL 3350 17 G PO PACK: 17.0000 g | PACK | Freq: Two times a day (BID) | ORAL | Status: AC

## 2023-03-15 NOTE — Plan of Care (Signed)
Patient remains incontinent of bowel and bladder despite toileting protocol. Discharge to SNF; unable to complete CIR program.

## 2023-03-15 NOTE — Progress Notes (Signed)
PROGRESS NOTE   Subjective/Complaints:  No new concerns this morning.  Transferring to SNF today.  ROS-denies fever, chills, chest pain or shortness of breath.  Denies abdominal pain, n/v/d.    Objective:   No results found.   Recent Labs    03/15/23 0534  WBC 5.0  HGB 11.8*  HCT 36.7  PLT 204      Recent Labs    03/15/23 0534  NA 140  K 4.2  CL 106  CO2 28  GLUCOSE 99  BUN 17  CREATININE 1.10*  CALCIUM 9.0       Intake/Output Summary (Last 24 hours) at 03/15/2023 1743 Last data filed at 03/15/2023 0909 Gross per 24 hour  Intake 720 ml  Output 250 ml  Net 470 ml         Physical Exam: Vital Signs Blood pressure 135/68, pulse 68, temperature 97.7 F (36.5 C), temperature source Oral, resp. rate 18, height 5\' 6"  (1.676 m), weight 64.2 kg, SpO2 99%.  General: No acute distress, resting in bed, appears comfortable Mood and affect are appropriate, cheerful Heart: Regular rate and rhythm no rubs murmurs or extra sounds Lungs: Clear to auscultation, breathing unlabored, no rales or wheezes Abdomen: Positive bowel sounds, soft nontender to palpation, nondistended Extremities: No clubbing, cyanosis, or edema Skin: No evidence of breakdown, no evidence of rash over exposed surfaces Neuro: Speech with moderate dysarthria, awake and alert, follows simple commands Moving all 4 extremities in bed  PRIOR EXAMS: Musculoskeletal: limited Left knee flexion to 90 degrees . 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  Left hemineglect Absent sensation to light touch left upper and lower extremities Musculoskeletal: Right great toe mild tenderness to pinch, onychogryphosis, no tenderness around the nailbed no swelling at the toe or at the MTP no erythema or drainage.  motor strength is 5/5 in right deltoid, bicep, tricep, grip, hip flexor, knee extensors, ankle dorsiflexor and plantar  flexor Left side difficult to examine due to severe sensory ataxia but at least 4/5 Wandering LUE movements  Sensory exam absent sensation to light touch and proprioception in LEFT upper and lower extremities     Assessment/Plan: 1. Functional deficits which require 3+ hours per day of interdisciplinary therapy in a comprehensive inpatient rehab setting. Physiatrist is providing close team supervision and 24 hour management of active medical problems listed below. Physiatrist and rehab team continue to assess barriers to discharge/monitor patient progress toward functional and medical goals  Care Tool:  Bathing    Body parts bathed by patient: Left arm, Chest, Abdomen, Front perineal area, Right upper leg, Left upper leg, Face, Left lower leg, Right lower leg, Right arm   Body parts bathed by helper: Buttocks     Bathing assist Assist Level: Minimal Assistance - Patient > 75%     Upper Body Dressing/Undressing Upper body dressing   What is the patient wearing?: Pull over shirt    Upper body assist Assist Level: Minimal Assistance - Patient > 75%    Lower Body Dressing/Undressing Lower body dressing      What is the patient wearing?: Pants, Underwear/pull up     Lower body assist Assist  for lower body dressing: Moderate Assistance - Patient 50 - 74%     Toileting Toileting    Toileting assist Assist for toileting: Moderate Assistance - Patient 50 - 74%     Transfers Chair/bed transfer  Transfers assist  Chair/bed transfer activity did not occur: Safety/medical concerns (unsafe to get up)  Chair/bed transfer assist level: Minimal Assistance - Patient > 75%     Locomotion Ambulation   Ambulation assist   Ambulation activity did not occur:  (requires skilled +2)  Assist level: 2 helpers Assistive device:  (B HHA) Max distance: 100   Walk 10 feet activity   Assist  Walk 10 feet activity did not occur: Safety/medical concerns  Assist level: 2  helpers Assistive device:  (B HHA)   Walk 50 feet activity   Assist Walk 50 feet with 2 turns activity did not occur: Safety/medical concerns  Assist level: 2 helpers Assistive device: Other (comment) (B HHA)    Walk 150 feet activity   Assist Walk 150 feet activity did not occur: Safety/medical concerns         Walk 10 feet on uneven surface  activity   Assist Walk 10 feet on uneven surfaces activity did not occur: Safety/medical concerns         Wheelchair     Assist Is the patient using a wheelchair?: Yes Type of Wheelchair: Manual    Wheelchair assist level: Minimal Assistance - Patient > 75% Max wheelchair distance: 75    Wheelchair 50 feet with 2 turns activity    Assist        Assist Level: Minimal Assistance - Patient > 75%   Wheelchair 150 feet activity     Assist      Assist Level: Moderate Assistance - Patient 50 - 74%   Blood pressure 135/68, pulse 68, temperature 97.7 F (36.5 C), temperature source Oral, resp. rate 18, height 5\' 6"  (1.676 m), weight 64.2 kg, SpO2 99%.  Medical Problem List and Plan: 1. Functional deficits secondary to ischemic infarct right PCA territory ( Right temporal infarct 11/12/22, old Left MCA infarct 2015)              -patient may shower             -ELOS/Goals: SNF pending 2/3 , medically stable for discharge   -DC to SNF today   2.  Antithrombotics: -DVT/anticoagulation:  Pharmaceutical: lovenox 40mg  qd -antiplatelet therapy: Aspirin and Brilinta for three 90 days followed by aspirin alone   -hx left CEA 3. Pain Management: Tylenol as needed   -1/17 gabapentin 100 mg 3 times daily for neuropathic pain-- helping -Severe left knee OA with contracture , pain ok unless knee is flexed to >90deg  4. Mood/Behavior/Sleep: LCSW to evaluate and provide emotional support             -antipsychotic agents: n/a              - Melatonin PRN   5. Neuropsych/cognition: This patient is not quite capable of  making decisions on her own behalf.   6. Skin/Wound Care: Routine skin care checks   7. Fluids/Electrolytes/Nutrition: Routine Is and Os and follow-up chemistries   8: Hypertension: monitor TID and prn (Diovan 320 mg daily at home) On avapro 150mg  will increase to 300mg   1/17 fair control continue current regimen and monitor Restart amlodipine , low dose 2.5 mg, improved 1/24 -1/25-26/25 BP a bit variable, recent med change, monitor for now Increase amlodipine to 10mg  ,  -  03/13/23 BPs ok, a little better, monitor with recent change.  -03/14/23 BPs great, cont current regimen -2/3 BP fair control, continue current regimen Vitals:   03/11/23 2038 03/12/23 0423 03/12/23 0856 03/12/23 1314  BP: (!) 140/69 (!) 152/74 129/62 (!) 143/69   03/12/23 2020 03/13/23 0405 03/13/23 1346 03/13/23 2022  BP: (!) 141/79 (!) 128/48 (!) 117/59 132/61   03/14/23 0418 03/14/23 1200 03/14/23 1904 03/15/23 0538  BP: 126/65 (!) 148/74 (!) 154/79 135/68   9: Hyperlipidemia: continue atorvastatin 80mg  daily   10: DM-2: A1c = 6.4% on October, 2024 (no home meds)  -2/3 glucose stable on BMP   11: Elevated serum creatinine/GFR ~55: ? Mild CKD; near baseline ~1.0        -Continue to encourage and monitor fluid intake Still with mildly elevated BUN/Creat enc fluids try  off ARB Enc fluids  Recheck in am-- improved 1/30 BUN/CR stable at 17/1.1      Latest Ref Rng & Units 03/15/2023    5:34 AM 03/11/2023    5:37 AM 03/08/2023    5:48 AM  BMP  Glucose 70 - 99 mg/dL 99  952  841   BUN 8 - 23 mg/dL 17  22  27    Creatinine 0.44 - 1.00 mg/dL 3.24  4.01  0.27   Sodium 135 - 145 mmol/L 140  139  135   Potassium 3.5 - 5.1 mmol/L 4.2  4.2  4.2   Chloride 98 - 111 mmol/L 106  105  104   CO2 22 - 32 mmol/L 28  25  22    Calcium 8.9 - 10.3 mg/dL 9.0  8.6  8.6     12. Eye redness improved   -Artificial tears as needed ordered, continue to monitor  13. Constipation -Glycolax and senna >> now only miralax BID -Type 5  large BM this am , continent -03/14/23 LBM yesterday, monitor  14.  Right great toe pain appears to be intermittent or may be associated with shoe wear, exam is negative except mild tenderness to pinch , no history of trauma or evidence of trauma she had a similar episode of left great toe pain yesterday or the day before with out physical exam findings.  Will continue to monitor and use as needed Tylenol   LOS: 25 days A FACE TO FACE EVALUATION WAS PERFORMED  Fanny Dance 03/15/2023, 5:43 PM

## 2023-03-15 NOTE — Progress Notes (Signed)
Inpatient Rehabilitation Disharge Medication Review by a Pharmacist  A complete drug regimen review was completed for this patient to identify any potential clinically significant medication issues.  High Risk Drug Classes Is patient taking? Indication by Medication  Antipsychotic No   Anticoagulant No   Antibiotic No   Opioid No   Antiplatelet Yes Aspirin 81 mg and ticagrelor through 05/12/23 then asprin alone  Hypoglycemics/insulin No   Vasoactive Medication Yes Amlodipine - HTN  Chemotherapy No   Other Yes Atorvastatin - hyperlipidemia Gabapentin - neuropathy Robaxin - spasms Miralax - constipation     Type of Medication Issue Identified Description of Issue Recommendation(s)  Drug Interaction(s) (clinically significant)     Duplicate Therapy     Allergy     No Medication Administration End Date     Incorrect Dose     Additional Drug Therapy Needed     Significant med changes from prior encounter (inform family/care partners about these prior to discharge). Clopidogrel changed to Ticagrelor until 05/12/22. New atorvastatin, aspirin 81 mg, gabapentin, miralax Communicate changes with patient/family prior to discharge.  Other       Clinically significant medication issues were identified that warrant physician communication and completion of prescribed/recommended actions by midnight of the next day:  No  Pharmacist comments:    Time spent performing this drug regimen review (minutes):  15  Alphia Moh, PharmD, [place], [facility] Clinical Pharmacist  Please check AMION for all [facility] phone numbers After 10:00 PM, call Main Pharmacy [phone]

## 2023-03-15 NOTE — Progress Notes (Signed)
Report called to nurse Lauris Poag at [facility]. Patient discharged and transported by ambulance with follow by family.

## 2023-03-15 NOTE — Progress Notes (Signed)
Inpatient Rehabilitation Care Coordinator Discharge Note   Patient Details  Name: Kathleen Figueroa MRN: [id number] Date of Birth: 10-Feb-1935   Discharge location: TRANSFERRING TO [facility] FOR MORE REHAB  Length of Stay: 25 DAYS  Discharge activity level: MIN-MOD LEVEL  Home/community participation: ACTIVE  Patient response WU:JWJXBJ Literacy - How often do you need to have someone help you when you read instructions, pamphlets, or other written material from your doctor or pharmacy?: Never  Patient response YN:WGNFAO Isolation - How often do you feel lonely or isolated from those around you?: Never  Services provided included: RD, PT, MD, OT, SLP, RN, CM, Pharmacy, SW  Financial Services:  Field seismologist Utilized: Private Insurance [employer] MEDICARE  Choices offered to/list presented to: SON AND DAUGHTER  Follow-up services arranged:  Other (Comment) (SNF)           Patient response to transportation need: Is the patient able to respond to transportation needs?: Yes In the past 12 months, has lack of transportation kept you from medical appointments or from getting medications?: No In the past 12 months, has lack of transportation kept you from meetings, work, or from getting things needed for daily living?: No   Patient/Family verbalized understanding of follow-up arrangements:  Yes  Individual responsible for coordination of the follow-up plan: FREDDIE-SON [phone]  Confirmed correct DME delivered: Lucy Chris 03/15/2023    Comments (or additional information):RECEIVED APPROVAL FOR PT TO GO TO [facility] REF [id number] CM TO FOLLOW NIKKI ADAMS [phone] APPROVED 1/31-2/4 WITH ADDITIONAL UPON EVALUATIONS  Summary of Stay    Date/Time Discharge Planning CSW  03/10/23 0827 Working on SNF bed for pt, fmaily has preferences and have given to worker. Pt continues to make slow progress RGD  03/03/23 4034 Family looking into ALF versus SNF, with  her levels she will need SNF and will need them to begin looking at faciliteis and working on this. Son working on [employer] also RGD  02/24/23 0827 Unsure if plan home versus SNF, family deciding best option for all of them. Does have LTC insurance and feels will have coverage for either. RGD       Lucy Chris

## 2023-03-16 DIAGNOSIS — N182 Chronic kidney disease, stage 2 (mild): Secondary | ICD-10-CM | POA: Diagnosis not present

## 2023-03-16 DIAGNOSIS — I6350 Cerebral infarction due to unspecified occlusion or stenosis of unspecified cerebral artery: Secondary | ICD-10-CM | POA: Diagnosis not present

## 2023-03-16 DIAGNOSIS — I10 Essential (primary) hypertension: Secondary | ICD-10-CM | POA: Diagnosis not present

## 2023-03-16 DIAGNOSIS — I130 Hypertensive heart and chronic kidney disease with heart failure and stage 1 through stage 4 chronic kidney disease, or unspecified chronic kidney disease: Secondary | ICD-10-CM | POA: Diagnosis not present

## 2023-03-16 DIAGNOSIS — E119 Type 2 diabetes mellitus without complications: Secondary | ICD-10-CM | POA: Diagnosis not present

## 2023-03-18 DIAGNOSIS — I10 Essential (primary) hypertension: Secondary | ICD-10-CM | POA: Diagnosis not present

## 2023-03-18 DIAGNOSIS — I6350 Cerebral infarction due to unspecified occlusion or stenosis of unspecified cerebral artery: Secondary | ICD-10-CM | POA: Diagnosis not present

## 2023-03-18 DIAGNOSIS — I130 Hypertensive heart and chronic kidney disease with heart failure and stage 1 through stage 4 chronic kidney disease, or unspecified chronic kidney disease: Secondary | ICD-10-CM | POA: Diagnosis not present

## 2023-03-18 DIAGNOSIS — E119 Type 2 diabetes mellitus without complications: Secondary | ICD-10-CM | POA: Diagnosis not present

## 2023-03-18 DIAGNOSIS — N182 Chronic kidney disease, stage 2 (mild): Secondary | ICD-10-CM | POA: Diagnosis not present

## 2023-03-19 DIAGNOSIS — M6281 Muscle weakness (generalized): Secondary | ICD-10-CM | POA: Diagnosis not present

## 2023-03-19 DIAGNOSIS — R2689 Other abnormalities of gait and mobility: Secondary | ICD-10-CM | POA: Diagnosis not present

## 2023-03-19 DIAGNOSIS — I639 Cerebral infarction, unspecified: Secondary | ICD-10-CM | POA: Diagnosis not present

## 2023-03-22 DIAGNOSIS — I6350 Cerebral infarction due to unspecified occlusion or stenosis of unspecified cerebral artery: Secondary | ICD-10-CM | POA: Diagnosis not present

## 2023-03-22 DIAGNOSIS — I130 Hypertensive heart and chronic kidney disease with heart failure and stage 1 through stage 4 chronic kidney disease, or unspecified chronic kidney disease: Secondary | ICD-10-CM | POA: Diagnosis not present

## 2023-03-22 DIAGNOSIS — I10 Essential (primary) hypertension: Secondary | ICD-10-CM | POA: Diagnosis not present

## 2023-03-22 DIAGNOSIS — N182 Chronic kidney disease, stage 2 (mild): Secondary | ICD-10-CM | POA: Diagnosis not present

## 2023-03-22 DIAGNOSIS — E119 Type 2 diabetes mellitus without complications: Secondary | ICD-10-CM | POA: Diagnosis not present

## 2023-03-23 DIAGNOSIS — I6350 Cerebral infarction due to unspecified occlusion or stenosis of unspecified cerebral artery: Secondary | ICD-10-CM | POA: Diagnosis not present

## 2023-03-23 DIAGNOSIS — I639 Cerebral infarction, unspecified: Secondary | ICD-10-CM | POA: Diagnosis not present

## 2023-03-23 DIAGNOSIS — I130 Hypertensive heart and chronic kidney disease with heart failure and stage 1 through stage 4 chronic kidney disease, or unspecified chronic kidney disease: Secondary | ICD-10-CM | POA: Diagnosis not present

## 2023-03-23 DIAGNOSIS — M6281 Muscle weakness (generalized): Secondary | ICD-10-CM | POA: Diagnosis not present

## 2023-03-23 DIAGNOSIS — E119 Type 2 diabetes mellitus without complications: Secondary | ICD-10-CM | POA: Diagnosis not present

## 2023-03-23 DIAGNOSIS — R2689 Other abnormalities of gait and mobility: Secondary | ICD-10-CM | POA: Diagnosis not present

## 2023-03-23 DIAGNOSIS — N182 Chronic kidney disease, stage 2 (mild): Secondary | ICD-10-CM | POA: Diagnosis not present

## 2023-03-23 DIAGNOSIS — I10 Essential (primary) hypertension: Secondary | ICD-10-CM | POA: Diagnosis not present

## 2023-03-24 DIAGNOSIS — R296 Repeated falls: Secondary | ICD-10-CM | POA: Diagnosis not present

## 2023-03-24 DIAGNOSIS — E119 Type 2 diabetes mellitus without complications: Secondary | ICD-10-CM | POA: Diagnosis not present

## 2023-03-24 DIAGNOSIS — I130 Hypertensive heart and chronic kidney disease with heart failure and stage 1 through stage 4 chronic kidney disease, or unspecified chronic kidney disease: Secondary | ICD-10-CM | POA: Diagnosis not present

## 2023-03-24 DIAGNOSIS — M25512 Pain in left shoulder: Secondary | ICD-10-CM | POA: Diagnosis not present

## 2023-03-24 DIAGNOSIS — I10 Essential (primary) hypertension: Secondary | ICD-10-CM | POA: Diagnosis not present

## 2023-03-24 DIAGNOSIS — N182 Chronic kidney disease, stage 2 (mild): Secondary | ICD-10-CM | POA: Diagnosis not present

## 2023-03-24 DIAGNOSIS — I6350 Cerebral infarction due to unspecified occlusion or stenosis of unspecified cerebral artery: Secondary | ICD-10-CM | POA: Diagnosis not present

## 2023-03-30 DIAGNOSIS — M6281 Muscle weakness (generalized): Secondary | ICD-10-CM | POA: Diagnosis not present

## 2023-03-30 DIAGNOSIS — I130 Hypertensive heart and chronic kidney disease with heart failure and stage 1 through stage 4 chronic kidney disease, or unspecified chronic kidney disease: Secondary | ICD-10-CM | POA: Diagnosis not present

## 2023-03-30 DIAGNOSIS — E119 Type 2 diabetes mellitus without complications: Secondary | ICD-10-CM | POA: Diagnosis not present

## 2023-03-30 DIAGNOSIS — R2689 Other abnormalities of gait and mobility: Secondary | ICD-10-CM | POA: Diagnosis not present

## 2023-03-30 DIAGNOSIS — N182 Chronic kidney disease, stage 2 (mild): Secondary | ICD-10-CM | POA: Diagnosis not present

## 2023-03-30 DIAGNOSIS — I639 Cerebral infarction, unspecified: Secondary | ICD-10-CM | POA: Diagnosis not present

## 2023-03-30 DIAGNOSIS — I6350 Cerebral infarction due to unspecified occlusion or stenosis of unspecified cerebral artery: Secondary | ICD-10-CM | POA: Diagnosis not present

## 2023-03-30 DIAGNOSIS — M25512 Pain in left shoulder: Secondary | ICD-10-CM | POA: Diagnosis not present

## 2023-03-30 DIAGNOSIS — I10 Essential (primary) hypertension: Secondary | ICD-10-CM | POA: Diagnosis not present

## 2023-04-03 ENCOUNTER — Other Ambulatory Visit: Payer: Self-pay

## 2023-04-03 ENCOUNTER — Emergency Department (HOSPITAL_COMMUNITY): Payer: Medicare HMO

## 2023-04-03 ENCOUNTER — Encounter (HOSPITAL_COMMUNITY): Payer: Self-pay | Admitting: Emergency Medicine

## 2023-04-03 ENCOUNTER — Emergency Department (HOSPITAL_COMMUNITY)
Admission: EM | Admit: 2023-04-03 | Discharge: 2023-04-03 | Disposition: A | Discharge status: Skilled Nursing Facility | Payer: Medicare HMO | Attending: Emergency Medicine | Admitting: Emergency Medicine

## 2023-04-03 DIAGNOSIS — S0083XA Contusion of other part of head, initial encounter: Secondary | ICD-10-CM | POA: Diagnosis not present

## 2023-04-03 DIAGNOSIS — E119 Type 2 diabetes mellitus without complications: Secondary | ICD-10-CM | POA: Insufficient documentation

## 2023-04-03 DIAGNOSIS — D649 Anemia, unspecified: Secondary | ICD-10-CM | POA: Insufficient documentation

## 2023-04-03 DIAGNOSIS — S0003XA Contusion of scalp, initial encounter: Secondary | ICD-10-CM | POA: Diagnosis not present

## 2023-04-03 DIAGNOSIS — S0990XA Unspecified injury of head, initial encounter: Secondary | ICD-10-CM | POA: Diagnosis present

## 2023-04-03 DIAGNOSIS — I10 Essential (primary) hypertension: Secondary | ICD-10-CM | POA: Insufficient documentation

## 2023-04-03 DIAGNOSIS — J432 Centrilobular emphysema: Secondary | ICD-10-CM | POA: Diagnosis not present

## 2023-04-03 DIAGNOSIS — S066X0A Traumatic subarachnoid hemorrhage without loss of consciousness, initial encounter: Secondary | ICD-10-CM | POA: Diagnosis not present

## 2023-04-03 DIAGNOSIS — S066XAA Traumatic subarachnoid hemorrhage with loss of consciousness status unknown, initial encounter: Secondary | ICD-10-CM | POA: Diagnosis not present

## 2023-04-03 DIAGNOSIS — W19XXXA Unspecified fall, initial encounter: Secondary | ICD-10-CM

## 2023-04-03 DIAGNOSIS — Z043 Encounter for examination and observation following other accident: Secondary | ICD-10-CM | POA: Diagnosis not present

## 2023-04-03 DIAGNOSIS — W06XXXA Fall from bed, initial encounter: Secondary | ICD-10-CM | POA: Diagnosis not present

## 2023-04-03 DIAGNOSIS — I609 Nontraumatic subarachnoid hemorrhage, unspecified: Secondary | ICD-10-CM

## 2023-04-03 LAB — CBC WITH DIFFERENTIAL/PLATELET
Abs Immature Granulocytes: 0.03 10*3/uL (ref 0.00–0.07)
Basophils Absolute: 0 10*3/uL (ref 0.0–0.1)
Basophils Relative: 1 %
Eosinophils Absolute: 0.2 10*3/uL (ref 0.0–0.5)
Eosinophils Relative: 4 %
HCT: 38.3 % (ref 36.0–46.0)
Hemoglobin: 11.8 g/dL — ABNORMAL LOW (ref 12.0–15.0)
Immature Granulocytes: 0 %
Lymphocytes Relative: 21 %
Lymphs Abs: 1.4 10*3/uL (ref 0.7–4.0)
MCH: 28.1 pg (ref 26.0–34.0)
MCHC: 30.8 g/dL (ref 30.0–36.0)
MCV: 91.2 fL (ref 80.0–100.0)
Monocytes Absolute: 1 10*3/uL (ref 0.1–1.0)
Monocytes Relative: 15 %
Neutro Abs: 4.1 10*3/uL (ref 1.7–7.7)
Neutrophils Relative %: 59 %
Platelets: 197 10*3/uL (ref 150–400)
RBC: 4.2 MIL/uL (ref 3.87–5.11)
RDW: 15.6 % — ABNORMAL HIGH (ref 11.5–15.5)
WBC: 6.8 10*3/uL (ref 4.0–10.5)
nRBC: 0 % (ref 0.0–0.2)

## 2023-04-03 LAB — PROTIME-INR
INR: 1 (ref 0.8–1.2)
Prothrombin Time: 13.2 s (ref 11.4–15.2)

## 2023-04-03 LAB — BASIC METABOLIC PANEL
Anion gap: 9 (ref 5–15)
BUN: 26 mg/dL — ABNORMAL HIGH (ref 8–23)
CO2: 27 mmol/L (ref 22–32)
Calcium: 9.3 mg/dL (ref 8.9–10.3)
Chloride: 106 mmol/L (ref 98–111)
Creatinine, Ser: 0.97 mg/dL (ref 0.44–1.00)
GFR, Estimated: 57 mL/min — ABNORMAL LOW (ref >60)
Glucose, Bld: 151 mg/dL — ABNORMAL HIGH (ref 70–99)
Potassium: 4.9 mmol/L (ref 3.5–5.1)
Sodium: 142 mmol/L (ref 135–145)

## 2023-04-03 NOTE — ED Notes (Signed)
..  Trauma Response Nurse Documentation   Kathleen Figueroa is a 88 y.o. female arriving to [facility] ED via EMS  On Brilinta (ticagrelor) 90 mg bid. Trauma was activated as a Level 2 by EDP/PA based on the following trauma criteria Elderly patients > 65 with head trauma on anti-coagulation (excluding ASA).  Patient cleared for CT by Dr. Blinda Leatherwood. Pt transported to CT with trauma response nurse present to monitor. RN remained with the patient throughout their absence from the department for clinical observation.   GCS 15 per arriving team.  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)    CVA (cerebral vascular accident) (HCC) 11/12/2022   Diabetes mellitus type 2, uncontrolled, 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   H/O detached retina repair    Hypertension    TIA (transient ischemic attack)      Past Surgical History:  Procedure Laterality Date   ENDARTERECTOMY Left 01/19/2014   Procedure: ENDARTERECTOMY CAROTID-LEFT;  Surgeon: Nada Libman, MD;  Location: [facility] OR;  Service: Vascular;  Laterality: Left;   EYE SURGERY     lens removed from right eye   PATCH ANGIOPLASTY Left 01/19/2014   Procedure: PATCH ANGIOPLASTY, LEFT CAROTID ARTERY USING HEMASHIELD PLATINUM FINESSE PATCH;  Surgeon: Nada Libman, MD;  Location: [facility] OR;  Service: Vascular;  Laterality: Left;   TONSILLECTOMY         Initial Focused Assessment (If applicable, or please see trauma documentation):   CT's Completed:   CT Head and CT C-Spine   Interventions:  Trauma assessment Transport to/fr CT with primary RN  Plan for disposition:  Other   Consults completed:  Neurosurgeon and  Interventional Radiologist at   Event Summary: Pt transported from SNF after fall out of bed @ 5in off the floor per staff hitting head on floor. Pt was a late activation due to confirmation of blood thinners after arrival to ED. Pt pleasant, follows commands. Recent admission for CVA. No active hemorrhage.  Pt transported to/from CT with primary RN without incident.  SAH found on CT.   MTP Summary (If applicable): N/A  Bedside handoff with ED RN Abby.    Heavenly Christine Dee  Trauma Response RN  Please call TRN at [phone] for further assistance.

## 2023-04-03 NOTE — Discharge Instructions (Addendum)
 You are seen in the emergency department today for a fall.  Your CT scan of your head initially showed signs of a subarachnoid hemorrhage on the left side.  We spoke with neurosurgery who advised having a repeat CT scan of your head performed about 6 hours after the initial scan.  On repeat scan, there is no changes in the appearance of the subarachnoid hemorrhage.  Given no changes on the CT imaging, it is advised that he follow-up with neurosurgery outpatient for further evaluation if your symptoms are not improving.  For any concerns of new or worsening symptoms, return to the emergency department.  You should hold off on your Brilinta for the next 72 hours.  You can restart taking this medication on Tuesday 04/06/2023.

## 2023-04-03 NOTE — ED Provider Notes (Signed)
 Patient is a handoff from [place], [place]. Mechanical fall today after rolling off bed, at baseline. Trace SAH in left hemisphere. Non-focal neuro exam. Call out to neurosurgery to discuss if they would recommend admission vs observation with repeat head CT. On Brilinta.  Neurosurgery recommendations: Repeat CT head in 6 hours. Discontinue Brilinta for 72 hours. Not planning on ED evaluation unless worsening mental decline. NP Megan. Physical Exam  BP (!) 159/79 (BP Location: Right Arm)   Pulse 80   Temp 98 F (36.7 C) (Oral)   Resp 17   SpO2 97%   Physical Exam Vitals and nursing note reviewed.  Constitutional:      Appearance: She is not ill-appearing or toxic-appearing.  HENT:     Head: Normocephalic.      Right Ear: No hemotympanum.     Left Ear: No hemotympanum.     Nose: Nose normal.     Mouth/Throat:     Mouth: Mucous membranes are moist.     Pharynx: Oropharynx is clear. Uvula midline. No oropharyngeal exudate or posterior oropharyngeal erythema.  Eyes:     General: Lids are normal.        Right eye: No discharge.        Left eye: No discharge.     Extraocular Movements: Extraocular movements intact.     Conjunctiva/sclera: Conjunctivae normal.     Pupils: Pupils are equal, round, and reactive to light.     Comments: Per family patient with new vision deficit following CVA in January.  Deficit in the left eye.  Left field cut.  Cardiovascular:     Rate and Rhythm: Normal rate and regular rhythm.     Pulses: Normal pulses.     Heart sounds: Normal heart sounds. No murmur heard. Pulmonary:     Effort: Pulmonary effort is normal. No respiratory distress.     Breath sounds: Normal breath sounds. No wheezing or rales.  Abdominal:     General: Bowel sounds are normal. There is no distension.     Palpations: Abdomen is soft.     Tenderness: There is no abdominal tenderness. There is no guarding or rebound.  Musculoskeletal:        General: No deformity.     Cervical  back: Neck supple.     Right lower leg: No edema.     Left lower leg: No edema.  Skin:    General: Skin is warm and dry.     Capillary Refill: Capillary refill takes less than 2 seconds.  Neurological:     General: No focal deficit present.     Mental Status: She is alert and oriented to person, place, and time. Mental status is at baseline.     GCS: GCS eye subscore is 4. GCS verbal subscore is 5. GCS motor subscore is 6.     Sensory: Sensation is intact.     Motor: Motor function is intact.     Coordination: Coordination is intact.     Comments: Gait deferred for patient safety.  Unchanged neurological examination after several hours of observation.  Psychiatric:        Mood and Affect: Mood normal.    Procedures  Procedures  ED Course / MDM   Clinical Course as of 04/03/23 1149  Sat Apr 03, 2023  0618 Trace subarachnoid hemorrhage on head CT, likely traumatic. [RS]  [phone] Neurosurg consult ordered. [RS]  [phone] Critical result from Dr. Margo Aye, radiologist - trace traumatic subarachnoid hemorrahage in L hemisphere as  documented above. I appreciate his collaboration in the care of this patient.  [RS]  1610 Neurosurg consult called. [RS]  [phone] Consult to neurosurgery NP Nada Libman who recommends a repeat CT scan at 6 hours with every 30 minute neurochecks.  If imaging is stable and patient remains at her neurologic baseline, may be discharged back to her SNF with close outpatient follow-up.  Does recommend holding Brilinta for 72 hours prior to resuming antiplatelet therapy.  I appreciate her collaboration in the care of this patient.  She does not plan to consult on the patient in person unless repeat imaging shows regression of hemorrhage or there is acute clinical neurologic change.  I appreciate collaboration of care with patient. [RS]    Clinical Course User Index [RS] Sponseller, Eugene Gavia, PA-C   Medical Decision Making Amount and/or Complexity of Data Reviewed Labs:  ordered. Radiology: ordered.   Patient is a handoff from [place], [place]. Mechanical fall today after rolling off bed, at baseline. Trace SAH in left hemisphere. Non-focal neuro exam. Call out to neurosurgery to discuss if they would recommend admission vs observation with repeat head CT. On Brilinta.  Neurosurgery recommendations: Repeat CT head in 6 hours. Discontinue Brilinta for 72 hours. Not planning on ED evaluation unless worsening mental decline. NP Megan.  Repeat CT head shows Unchanged subarachnoid hemorrhage along the left temporal lobe and possibly the right frontoparietal region. No evidence of intraventricular extension. No hydrocephalus. 2. Left frontal scalp soft tissue hematoma. No underlying fracture.  Given no significant changes in the CT head reading, patient is stable for outpatient follow-up.  Advise holding Brilinta for the next 72 hours before resuming the therapy.  Informed patient and family of reassuring findings.  Will arrange [facility] transport back to facility.  Patient discharged home in stable condition.      Smitty Knudsen, PA-C 04/03/23 1610    Tegeler, Canary Brim, MD 04/03/23 [phone]

## 2023-04-03 NOTE — ED Notes (Signed)
 Activated as level 2 trauma by [employer], PA

## 2023-04-03 NOTE — ED Provider Notes (Signed)
 [place] EMERGENCY DEPARTMENT AT [facility] Provider Note   CSN: [id number] Arrival date & time: 04/03/23  0540     History  Chief Complaint  Patient presents with   [name]   Kathleen Figueroa is a 88 y.o. female who presents from [facility] with fall out of bed.  Initial EMS reports that patient is not on any anticoagulation, however per my review of the patient's medications at her fillets she is on aspirin and Brilinta.  Level 2 trauma activated by this provider for fall on thinners.  Collateral history obtained from the patient's daughter [name] at the bedside.  Patient had a large stroke in January of this year but is currently at her new mental status baseline since that time as well as neurologic baseline.  Per chart review had a large territory PCA infarct in January of this year.  Was in inpatient rehab now currently in rehab at [facility] with plan to transition back home following completion of rehab.  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  I Personally reviewed the remainder of her medical records.  She is history of CKD stage II, type 2 diabetes, CAD, history of carotid endarterectomy.  On aspirin and Brilinta last dose 7 PM on 2/21.  HPI     Home Medications Prior to Admission medications   Medication Sig Start Date End Date Taking? Authorizing Provider  amLODipine (NORVASC) 10 MG tablet Take 1 tablet (10 mg total) by mouth daily. 11/13/22   Ghimire, Werner Lean, MD  aspirin EC 81 MG tablet Take 1 tablet (81 mg total) by mouth daily. Swallow whole. 02/19/23   Hongalgi, Maximino Greenland, MD  atorvastatin (LIPITOR) 80 MG tablet Take 1 tablet (80 mg total) by mouth daily. 02/19/23   Hongalgi, Maximino Greenland, MD  gabapentin (NEURONTIN) 100 MG capsule Take 2 capsules (200 mg total) by mouth 3 (three) times daily. 03/15/23   Setzer, Lynnell Jude, PA-C  polyethylene glycol (MIRALAX / GLYCOLAX) 17 g packet  Take 17 g by mouth 2 (two) times daily. 03/15/23   Setzer, Lynnell Jude, PA-C  ticagrelor (BRILINTA) 90 MG TABS tablet Take 1 tablet (90 mg total) by mouth 2 (two) times daily. 02/18/23 05/13/23  Elease Etienne, MD      Allergies    Aspirin    Review of Systems   Review of Systems  Unable to perform ROS: Acuity of condition    Physical Exam Updated Vital Signs BP (!) 159/79 (BP Location: Right Arm)   Pulse 80   Temp 98 F (36.7 C) (Oral)   Resp 17   SpO2 97%  Physical Exam Vitals and nursing note reviewed.  Constitutional:      Appearance: She is not ill-appearing or toxic-appearing.  HENT:     Head: Normocephalic.      Right Ear: No hemotympanum.     Left Ear: No hemotympanum.     Nose: Nose normal.     Mouth/Throat:     Mouth: Mucous membranes are moist.     Pharynx: Oropharynx is clear. Uvula midline. No oropharyngeal exudate or posterior oropharyngeal erythema.  Eyes:     General: Lids are normal.        Right eye: No discharge.        Left eye: No discharge.     Extraocular Movements: Extraocular movements intact.     Conjunctiva/sclera: Conjunctivae normal.  Pupils: Pupils are equal, round, and reactive to light.     Comments: Per family patient with new vision deficit following CVA in January.  Deficit in the left eye.  Left field cut.  Cardiovascular:     Rate and Rhythm: Normal rate and regular rhythm.     Pulses: Normal pulses.     Heart sounds: Normal heart sounds. No murmur heard. Pulmonary:     Effort: Pulmonary effort is normal. No respiratory distress.     Breath sounds: Normal breath sounds. No wheezing or rales.  Abdominal:     General: Bowel sounds are normal. There is no distension.     Palpations: Abdomen is soft.     Tenderness: There is no abdominal tenderness. There is no guarding or rebound.  Musculoskeletal:        General: No deformity.     Cervical back: Neck supple.     Right lower leg: No edema.     Left lower leg: No edema.  Skin:     General: Skin is warm and dry.     Capillary Refill: Capillary refill takes less than 2 seconds.  Neurological:     General: No focal deficit present.     Mental Status: She is alert and oriented to person, place, and time. Mental status is at baseline.     GCS: GCS eye subscore is 4. GCS verbal subscore is 5. GCS motor subscore is 6.     Sensory: Sensation is intact.     Motor: Motor function is intact.     Coordination: Coordination is intact.     Comments: Gait deferred for patient safety.  Psychiatric:        Mood and Affect: Mood normal.     ED Results / Procedures / Treatments   Labs (all labs ordered are listed, but only abnormal results are displayed) Labs Reviewed  CBC WITH DIFFERENTIAL/PLATELET - Abnormal; Notable for the following components:      Result Value   Hemoglobin 11.8 (*)    RDW 15.6 (*)    All other components within normal limits  PROTIME-INR  BASIC METABOLIC PANEL    EKG None  Radiology CT HEAD WO CONTRAST ( ) Addendum Date: 04/03/2023 ADDENDUM REPORT: 04/03/2023 07:06 ADDENDUM: Study discussed by telephone with Dr. Loleta Dicker on 04/03/2023 at 0627 hours. Electronically Signed   By: Odessa Fleming M.D.   On: 04/03/2023 07:06   Result Date: 04/03/2023 CLINICAL DATA:  88 year old female status post fall from bed striking head. Scalp hematoma. EXAM: CT HEAD WITHOUT CONTRAST TECHNIQUE: Contiguous axial images were obtained from the base of the skull through the vertex without intravenous contrast. RADIATION DOSE REDUCTION: This exam was performed according to the departmental dose-optimization program which includes automated exposure control, adjustment of the mA and/or kV according to patient size and/or use of iterative reconstruction technique. COMPARISON:  Head CT 02/19/2023. FINDINGS: Brain: Small volume posterior left hemisphere subarachnoid hemorrhage is identified in a single sulcus on series 3, image 14 at the junction of the left posterior temporal  and occipital lobes. Superimposed chronic infarct with encephalomalacia in the posterior left MCA territory. Contralateral confluent right PCA territory infarct with evolution since last month. Petechial hemorrhage versus laminar necrosis there (series 3, image 21) but no malignant hemorrhagic transformation. No intraventricular hemorrhage. No subdural blood identified. No intracranial mass effect. No ventriculomegaly. Normal basilar cisterns. Superimposed advanced chronic small vessel disease in the bilateral deep gray nuclei and brainstem. No cortically based acute infarct identified.  Vascular: Calcified atherosclerosis at the skull base. No suspicious intracranial vascular hyperdensity. Skull: Stable, intact.  No fracture identified. Sinuses/Orbits: Visualized paranasal sinuses and mastoids are stable and well aerated. Other: Broad-based left anterior convexity scalp hematoma up to 8 mm in thickness. Underlying left frontal bone appears stable and intact. No scalp soft tissue gas. Orbits soft tissues appears stable and intact. ---------------------------------------------------- Traumatic Brain Injury Risk Stratification Skull Fracture: No - Low/mBIG 1 Subdural Hematoma (SDH): No - Low Subarachnoid Hemorrhage [facility]): trace (up to 2 sulci) - Low/mBIG 1 Epidural Hematoma (EDH): No - Low/mBIG 1 Cerebral contusion, intra-axial, intraparenchymal Hemorrhage (IPH): No Intraventricular Hemorrhage (IVH): No - Low/mBIG 1 Midline Shift > 1mm or Edema/effacement of sulci/vents: No - Low/mBIG 1 ---------------------------------------------------- IMPRESSION: 1. Trace posttraumatic Subarachnoid Hemorrhage in the left hemisphere. No other acute intracranial hemorrhage or acute brain injury is identified. 2. Left anterior scalp hematoma.  No skull fracture identified. 3. Evolution of large right PCA territory infarcts since last month with no malignant hemorrhagic transformation or mass effect. Underlying advanced chronic  ischemic disease. Electronically Signed: By: Odessa Fleming M.D. On: 04/03/2023 06:16   CT Cervical Spine Wo Contrast Result Date: 04/03/2023 CLINICAL DATA:  88 year old female status post fall from bed striking head. Scalp hematoma. EXAM: CT CERVICAL SPINE WITHOUT CONTRAST TECHNIQUE: Multidetector CT imaging of the cervical spine was performed without intravenous contrast. Multiplanar CT image reconstructions were also generated. RADIATION DOSE REDUCTION: This exam was performed according to the departmental dose-optimization program which includes automated exposure control, adjustment of the mA and/or kV according to patient size and/or use of iterative reconstruction technique. COMPARISON:  Head CT today.  Cervical spine CT 02/11/2023. FINDINGS: Alignment: Reversal of cervical lordosis versus straightening last month. Cervicothoracic junction alignment is within normal limits. Bilateral posterior element alignment is within normal limits. Skull base and vertebrae: Bone mineralization is within normal limits for age. Visualized skull base is intact. No atlanto-occipital dissociation. C1 and C2 appear intact and aligned. No acute osseous abnormality identified. Soft tissues and spinal canal: No prevertebral fluid or swelling. No visible canal hematoma. Negative visible lung contrast neck soft tissues aside from calcified carotid atherosclerosis. 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 The appearance is stable from last month. Upper chest: Visible upper thoracic levels appear intact. Centrilobular emphysema and mild scarring in the lung apices. IMPRESSION: 1. No acute traumatic injury identified in the cervical spine. 2. Stable advanced lower cervical spine and cervicothoracic junction degeneration superimposed on congenital non-segmentation of C6-C7. 3.  Emphysema (ICD10-J43.9). Electronically Signed   By: Odessa Fleming M.D.    On: 04/03/2023 06:18    Procedures .Critical Care  Performed by: Paris Lore, PA-C Authorized by: Paris Lore, PA-C   Critical care provider statement:    Critical care time (minutes):  60   Critical care was time spent personally by me on the following activities:  Development of treatment plan with patient or surrogate, discussions with consultants, evaluation of patient's response to treatment, examination of patient, ordering and review of laboratory studies, ordering and review of radiographic studies, ordering and performing treatments and interventions, pulse oximetry, re-evaluation of patient's condition and review of old charts     Medications Ordered in ED Medications - No data to display  ED Course/ Medical Decision Making/ A&P Clinical Course as of 04/03/23 0717  Sat Apr 03, 2023  0618 Trace subarachnoid hemorrhage on head CT, likely traumatic. [RS]  [phone] Neurosurg  consult ordered. [RS]  5366 Critical result from Dr. Margo Aye, radiologist - trace traumatic subarachnoid hemorrahage in L hemisphere as documented above. I appreciate his collaboration in the care of this patient.  [RS]  4403 Neurosurg consult called. [RS]  [phone] Consult to neurosurgery NP Nada Libman who recommends a repeat CT scan at 6 hours with every 30 minute neurochecks.  If imaging is stable and patient remains at her neurologic baseline, may be discharged back to her SNF with close outpatient follow-up.  Does recommend holding Brilinta for 72 hours prior to resuming antiplatelet therapy.  I appreciate her collaboration in the care of this patient.  She does not plan to consult on the patient in person unless repeat imaging shows regression of hemorrhage or there is acute clinical neurologic change.  I appreciate collaboration of care with patient. [RS]    Clinical Course User Index [RS] Paris Lore, PA-C                                 Medical Decision Making 88 y/o female with  fall on thinners with left frontal hematoma.  Hypertensive on intake vitals otherwise normal.  Baseline neurologic exam per chart review, left frontal hematoma with bruising.  Amount and/or Complexity of Data Reviewed Labs: ordered.    Details: CBC with anemia with hemoglobin 11.8, INR is normal.  Metabolic panel pending. Radiology: ordered.    Details: CT head with trace traumatic subarachnoid hemorrhage on the left.   Neurosurgery NP recommending serial neurologic exams and repeat head CT at the 6-hour mark.  If stable without evolving neurologic change on exam patient may be discharged back to her SNF as above.   Care of this patient signed out to oncoming ED provider Eual Fines, PA-C at time of shift change. All pertinent HPI, physical exam, and laboratory findings were discussed with them prior to my departure. Disposition of patient pending completion of workup, reevaluation, and clinical judgement of oncoming ED provider.   Kathleen Figueroa and her family  voiced understanding of her medical evaluation and treatment plan. Each of their questions answered to their expressed satisfaction.   This chart was dictated using voice recognition software, Dragon. Despite the best efforts of this provider to proofread and correct errors, errors may still occur which can change documentation meaning.          Final Clinical Impression(s) / ED Diagnoses Final diagnoses:  None    Rx / DC Orders ED Discharge Orders     None         Paris Lore, PA-C 04/03/23 5956    Gilda Crease, MD 04/03/23 0730

## 2023-04-03 NOTE — ED Triage Notes (Addendum)
 BIBA Per EMS: Pt coming from [facility] w/ fall. Pt fell out of bed and hit her head. Bed 5 inches from floor. Knot on head.  Pt is at baseline [employer]

## 2023-04-03 NOTE — ED Notes (Signed)
 Pt transported to CT ?

## 2023-04-03 NOTE — ED Notes (Signed)
 RN discussed d/c paperwork with family and pt at bedside. Everyone verbalized understanding and no additional questions at this time. NS called [facility]

## 2023-04-03 NOTE — Consult Note (Signed)
 Reason for Consult: Tiny traumatic subarachnoid hemorrhage Referring Physician: Dr. [name] is an 88 y.o. female.  HPI: The patient is an 88 year old white female on Brilinta who by report fell out of her bed at the nursing home.  She was brought to the emergency department and a head CAT scan was obtained because that is the routine.  It demonstrated a tiny subarachnoid hemorrhage.  A neurosurgical consultation was requested.  Presently the patient is alert and pleasant without complaints.  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)    CVA (cerebral vascular accident) (HCC) 11/12/2022   Diabetes mellitus type 2, uncontrolled, 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   H/O detached retina repair    Hypertension    TIA (transient ischemic attack)     Past Surgical History:  Procedure Laterality Date   ENDARTERECTOMY Left 01/19/2014   Procedure: ENDARTERECTOMY CAROTID-LEFT;  Surgeon: Kathleen Libman, MD;  Location: [facility] OR;  Service: Vascular;  Laterality: Left;   EYE SURGERY     lens removed from right eye   PATCH ANGIOPLASTY Left 01/19/2014   Procedure: PATCH ANGIOPLASTY, LEFT CAROTID ARTERY USING HEMASHIELD PLATINUM FINESSE PATCH;  Surgeon: Kathleen Libman, MD;  Location: [facility] OR;  Service: Vascular;  Laterality: Left;   TONSILLECTOMY      Family History  Problem Relation Age of Onset   Hypertension Mother    Diabetes Mother    Hypertension Father     Social History:  reports that she quit smoking about 45 years ago. Her smoking use included cigarettes. She has never used smokeless tobacco. She reports that she does not drink alcohol and does not use drugs.  Allergies:   Allergies  Allergen Reactions   Aspirin Nausea Only    Higher dose    Medications:   Results for orders placed or performed during the hospital encounter of 04/03/23 (from the past 48 hours)  CBC with Differential     Status: Abnormal   Collection Time: 04/03/23  6:20 AM  Result Value Ref Range   WBC 6.8 4.0 - 10.5 K/uL   RBC 4.20 3.87 - 5.11 MIL/uL   Hemoglobin 11.8 (L) 12.0 - 15.0 g/dL   HCT 16.1 09.6 - 04.5 %   MCV 91.2 80.0 - 100.0 fL   MCH 28.1 26.0 - 34.0 pg   MCHC 30.8 30.0 - 36.0 g/dL   RDW 40.9 (H) 81.1 - 91.4 %   Platelets 197 150 - 400 K/uL   nRBC 0.0 0.0 - 0.2 %   Neutrophils Relative % 59 %   Neutro Abs 4.1 1.7 - 7.7 K/uL   Lymphocytes Relative 21 %   Lymphs Abs 1.4 0.7 - 4.0 K/uL   Monocytes Relative 15 %   Monocytes Absolute 1.0 0.1 - 1.0 K/uL   Eosinophils Relative 4 %   Eosinophils Absolute 0.2 0.0 - 0.5 K/uL   Basophils Relative 1 %   Basophils Absolute 0.0 0.0 - 0.1 K/uL   Immature Granulocytes 0 %   Abs Immature Granulocytes 0.03 0.00 - 0.07 K/uL    Comment: Performed at [facility], [address] [place], [place] [address]  Basic metabolic panel  Status: Abnormal   Collection Time: 04/03/23  6:20 AM  Result Value Ref Range   Sodium 142 135 - 145 mmol/L   Potassium 4.9 3.5 - 5.1 mmol/L   Chloride 106 98 - 111 mmol/L   CO2 27 22 - 32 mmol/L   Glucose, Bld 151 (H) 70 - 99 mg/dL    Comment: Glucose reference range applies only to samples taken after fasting for at least 8 hours.   BUN 26 (H) 8 - 23 mg/dL   Creatinine, Ser 8.11 0.44 - 1.00 mg/dL   Calcium 9.3 8.9 - 91.4 mg/dL   GFR, Estimated 57 (L) >60 mL/min    Comment: (NOTE) Calculated using the CKD-EPI Creatinine Equation (2021)    Anion gap 9 5 - 15    Comment: Performed at [facility], [address] [place], [place] [address]  Protime-INR     Status: None   Collection Time: 04/03/23  6:20 AM  Result Value Ref Range   Prothrombin Time 13.2 11.4 - 15.2 seconds   INR  1.0 0.8 - 1.2    Comment: (NOTE) INR goal varies based on device and disease states. Performed at [facility], [address], [place], [place] [address]     CT HEAD WO CONTRAST ( ) Addendum Date: 04/03/2023 ADDENDUM REPORT: 04/03/2023 07:06 ADDENDUM: Study discussed by telephone with Dr. Loleta Dicker on 04/03/2023 at 0627 hours. Electronically Signed   By: Odessa Fleming M.D.   On: 04/03/2023 07:06   Result Date: 04/03/2023 CLINICAL DATA:  88 year old female status post fall from bed striking head. Scalp hematoma. EXAM: CT HEAD WITHOUT CONTRAST TECHNIQUE: Contiguous axial images were obtained from the base of the skull through the vertex without intravenous contrast. RADIATION DOSE REDUCTION: This exam was performed according to the departmental dose-optimization program which includes automated exposure control, adjustment of the mA and/or kV according to patient size and/or use of iterative reconstruction technique. COMPARISON:  Head CT 02/19/2023. FINDINGS: Brain: Small volume posterior left hemisphere subarachnoid hemorrhage is identified in a single sulcus on series 3, image 14 at the junction of the left posterior temporal and occipital lobes. Superimposed chronic infarct with encephalomalacia in the posterior left MCA territory. Contralateral confluent right PCA territory infarct with evolution since last month. Petechial hemorrhage versus laminar necrosis there (series 3, image 21) but no malignant hemorrhagic transformation. No intraventricular hemorrhage. No subdural blood identified. No intracranial mass effect. No ventriculomegaly. Normal basilar cisterns. Superimposed advanced chronic small vessel disease in the bilateral deep gray nuclei and brainstem. No cortically based acute infarct identified. Vascular: Calcified atherosclerosis at the skull base. No suspicious intracranial vascular hyperdensity. Skull: Stable, intact.  No fracture identified. Sinuses/Orbits: Visualized paranasal  sinuses and mastoids are stable and well aerated. Other: Broad-based left anterior convexity scalp hematoma up to 8 mm in thickness. Underlying left frontal bone appears stable and intact. No scalp soft tissue gas. Orbits soft tissues appears stable and intact. ---------------------------------------------------- Traumatic Brain Injury Risk Stratification Skull Fracture: No - Low/mBIG 1 Subdural Hematoma (SDH): No - Low Subarachnoid Hemorrhage [facility]): trace (up to 2 sulci) - Low/mBIG 1 Epidural Hematoma (EDH): No - Low/mBIG 1 Cerebral contusion, intra-axial, intraparenchymal Hemorrhage (IPH): No Intraventricular Hemorrhage (IVH): No - Low/mBIG 1 Midline Shift > 1mm or Edema/effacement of sulci/vents: No - Low/mBIG 1 ---------------------------------------------------- IMPRESSION: 1. Trace posttraumatic Subarachnoid Hemorrhage in the left hemisphere. No other acute intracranial hemorrhage or acute brain injury is identified. 2. Left anterior scalp hematoma.  No skull fracture identified.  3. Evolution of large right PCA territory infarcts since last month with no malignant hemorrhagic transformation or mass effect. Underlying advanced chronic ischemic disease. Electronically Signed: By: Odessa Fleming M.D. On: 04/03/2023 06:16   CT Cervical Spine Wo Contrast Result Date: 04/03/2023 CLINICAL DATA:  88 year old female status post fall from bed striking head. Scalp hematoma. EXAM: CT CERVICAL SPINE WITHOUT CONTRAST TECHNIQUE: Multidetector CT imaging of the cervical spine was performed without intravenous contrast. Multiplanar CT image reconstructions were also generated. RADIATION DOSE REDUCTION: This exam was performed according to the departmental dose-optimization program which includes automated exposure control, adjustment of the mA and/or kV according to patient size and/or use of iterative reconstruction technique. COMPARISON:  Head CT today.  Cervical spine CT 02/11/2023. FINDINGS: Alignment: Reversal of cervical  lordosis versus straightening last month. Cervicothoracic junction alignment is within normal limits. Bilateral posterior element alignment is within normal limits. Skull base and vertebrae: Bone mineralization is within normal limits for age. Visualized skull base is intact. No atlanto-occipital dissociation. C1 and C2 appear intact and aligned. No acute osseous abnormality identified. Soft tissues and spinal canal: No prevertebral fluid or swelling. No visible canal hematoma. Negative visible lung contrast neck soft tissues aside from calcified carotid atherosclerosis. 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 The appearance is stable from last month. Upper chest: Visible upper thoracic levels appear intact. Centrilobular emphysema and mild scarring in the lung apices. IMPRESSION: 1. No acute traumatic injury identified in the cervical spine. 2. Stable advanced lower cervical spine and cervicothoracic junction degeneration superimposed on congenital non-segmentation of C6-C7. 3.  Emphysema (ICD10-J43.9). Electronically Signed   By: Odessa Fleming M.D.   On: 04/03/2023 06:18    ROS: As above Blood pressure (!) 159/79, pulse 80, temperature 98 F (36.7 C), temperature source Oral, resp. rate 17, SpO2 97%. Estimated body mass index is 22.84 kg/m as calculated from the following:   Height as of 02/18/23: 5\' 6"  (1.676 m).   Weight as of 03/04/23: 64.2 kg.  Physical Exam  General: An 88 year old black female in no apparent distress  HEENT: The patient's pupils are miotic and equal.  Extraocular muscles are intact.  She has some left scalp swelling  Neck: Unremarkable  Thorax: Symmetric  Abdomen: Soft  Extremities: Unremarkable  Neurologic exam: The patient is alert and oriented x 3 to person, [facility], month.  Her strength is grossly normal in her bilateral handgrip, bicep, gastrocnemius and  dorsiflexors.  Cerebellar exam is intact to rapid alternating movements of the upper extremities.  Sensory function is grossly intact to light touch sensation all tested dermatomes bilaterally.  I reviewed the patient's head CT performed at [facility] today.  There is a tiny traumatic subarachnoid hemorrhage.  She has had an old right posterior cerebral artery infarct.  Assessment/Plan: Tiny subarachnoid hemorrhage: The best I can tell the patient is at her neurological baseline.  I do not think she needs a follow-up scan less clinically indicated.  I would suggest holding her Brilinta for a couple of days.  No neurosurgical follow-up is needed.  Please call if I can be of further assistance.  Kathleen Figueroa 04/03/2023, 9:31 AM

## 2023-04-05 DIAGNOSIS — S066X0A Traumatic subarachnoid hemorrhage without loss of consciousness, initial encounter: Secondary | ICD-10-CM | POA: Diagnosis not present

## 2023-04-05 DIAGNOSIS — R296 Repeated falls: Secondary | ICD-10-CM | POA: Diagnosis not present

## 2023-04-06 ENCOUNTER — Ambulatory Visit (INDEPENDENT_AMBULATORY_CARE_PROVIDER_SITE_OTHER): Payer: Medicare HMO | Admitting: Neurology

## 2023-04-06 ENCOUNTER — Encounter: Payer: Self-pay | Admitting: Neurology

## 2023-04-06 VITALS — BP 142/87 | HR 78 | Ht 65.0 in

## 2023-04-06 DIAGNOSIS — I639 Cerebral infarction, unspecified: Secondary | ICD-10-CM

## 2023-04-06 DIAGNOSIS — N182 Chronic kidney disease, stage 2 (mild): Secondary | ICD-10-CM | POA: Diagnosis not present

## 2023-04-06 DIAGNOSIS — I130 Hypertensive heart and chronic kidney disease with heart failure and stage 1 through stage 4 chronic kidney disease, or unspecified chronic kidney disease: Secondary | ICD-10-CM | POA: Diagnosis not present

## 2023-04-06 DIAGNOSIS — I10 Essential (primary) hypertension: Secondary | ICD-10-CM | POA: Diagnosis not present

## 2023-04-06 DIAGNOSIS — Z9889 Other specified postprocedural states: Secondary | ICD-10-CM | POA: Diagnosis not present

## 2023-04-06 DIAGNOSIS — I6350 Cerebral infarction due to unspecified occlusion or stenosis of unspecified cerebral artery: Secondary | ICD-10-CM | POA: Diagnosis not present

## 2023-04-06 DIAGNOSIS — E119 Type 2 diabetes mellitus without complications: Secondary | ICD-10-CM | POA: Diagnosis not present

## 2023-04-06 NOTE — Progress Notes (Signed)
 Chief Complaint  Patient presents with   New Patient (Initial Visit)    Pt in 15, here with daughter Kathleen Figueroa Pt is here for hospital follow up. Pt's daughter states pt had a fall last week. Pt's stated she has been feeling well since. Pt states she did have a mild headache yesterday, feels better today.       ASSESSMENT AND PLAN  Kathleen Figueroa is a 88 y.o. female   Stroke  Multiple vascular risk factor, aging, hypertension hyperlipidemia history of diabetes  Supposed to complete aspirin 81+ Brilinta 90 mg twice a day for 90 days, and then switched to aspirin 81+ Plavix 75 mg daily from April 2025,  MRI of the brain showed large right PCA stroke, also extensive periventricular small vessel disease previous right parietal stroke,  Patient has dense left hemiparesis, left hemivisual field loss, significant vision issues due to her eye issues  Daughter and son are concerned about her long-term prognosis, most of the stroke recovery will happen within 6 months post a stroke, but with her significant deficit, large size new stroke, history of stroke, extensive periventricular small vessel disease, I will be expecting her continue to deal with some deficit, fluctuation,   DIAGNOSTIC DATA (LABS, IMAGING, TESTING) - I reviewed patient records, labs, notes, testing and imaging myself where available.   MEDICAL HISTORY:  Kathleen Figueroa, is a 88 year old female, accompanied by her daughter, follow-up for stroke,   History is obtained from the patient and review of electronic medical records. I personally reviewed pertinent available imaging films in PACS.   PMHx of  Stroke Left carotid artery stenosis status post endarterectomy Diabetes HLD HTN Left detached retinal-poor left vision, right cataract surgery.  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[employer], was very active at her church, travel to [place] just a few months ago, but she has quit driving due to her eye issues  She was found down at home with left-sided weakness on February 11, 2023, MRI showed large right PCA territory stroke, petechiae hemorrhage and edema without significant mass effect,  CT angiogram of head and neck, intracranial atherosclerotic disease, with moderate left, mild right intracranial ICA stenosis severe right V4 stenosis, severe left vertebral artery origin stenosis, moderate plaque at the right carotid bifurcation without significant stenosis or occlusion  Echocardiogram normal ejection fraction,  LDL 193, A1c 6.5  She was on Plavix 75 daily prior to admission, was treated with aspirin 81+ Brilinta 90 mg twice daily for 90 days, then should continue on aspirin and Plavix 75 mg daily per vascular neurologist Dr. Pearlean Brownie  She had history of stroke, in 2015, presenting with right hand and arm clumsiness, weakness, slurred speech, MRI of the brain showed multiple small foci of acute left hemisphere, watershed infarction, Doppler study showed left internal carotid artery 80 to 99% stenosis later underwent left carotid endarterectomy  She lives at a rehabilitation, going to be discharged with her daughter soon, I was also able to update her son Kathleen Figueroa via phone call today,  PHYSICAL EXAM:   Vitals:   04/06/23 1554  BP: (!) 142/87  Pulse: 78  Height: 5\' 5"  (1.651 m)    Body mass index is 23.55 kg/m.  PHYSICAL EXAMNIATION:  Gen: NAD, conversant, well nourised, well groomed  Cardiovascular: Regular rate rhythm, no peripheral edema, warm, nontender. Eyes: Conjunctivae clear without exudates or hemorrhage Neck: Supple, no carotid bruits. Pulmonary: Clear to auscultation bilaterally   NEUROLOGICAL EXAM:  MENTAL STATUS: Speech/cognition: Awake, alert, oriented to history taking and casual conversation CRANIAL NERVES: CN II:  Postsurgical pupil change, left Hemi visual field deficit, poor vision bilaterally, right eye could not read large print, left eye could not count finger CN III, IV, VI: extraocular movement are normal. No ptosis. CN V: Facial sensation is intact to light touch CN VII: Face is symmetric with normal eye closure  CN VIII: Hearing is normal to causal conversation. CN IX, X: Phonation is normal. CN XI: Head turning and shoulder shrug are intact  MOTOR: Left hemiparesis, antigravity movement of left arm, and leg,  REFLEXES: Present fairly symmetric  SENSORY: Intact to light touch, no clear hemineglect  COORDINATION: There is no trunk or limb dysmetria noted.  GAIT/STANCE: deferred  REVIEW OF SYSTEMS:  Full 14 system review of systems performed and notable only for as above All other review of systems were negative.   ALLERGIES: Allergies  Allergen Reactions   Aspirin Nausea Only    Higher dose    HOME MEDICATIONS: Current Outpatient Medications  Medication Sig Dispense Refill   acetaminophen (TYLENOL) 325 MG tablet Take 650 mg by mouth every 6 (six) hours as needed.     amLODipine (NORVASC) 10 MG tablet Take 1 tablet (10 mg total) by mouth daily. 30 tablet 0   aspirin EC 81 MG tablet Take 1 tablet (81 mg total) by mouth daily. Swallow whole.     atorvastatin (LIPITOR) 80 MG tablet Take 1 tablet (80 mg total) by mouth daily.     feeding supplement (BOOST HIGH PROTEIN) LIQD Take 1 Container by mouth 3 (three) times daily between meals.     gabapentin (NEURONTIN) 100 MG capsule Take 2 capsules (200 mg total) by mouth 3 (three) times daily.     polyethylene glycol (MIRALAX / GLYCOLAX) 17 g packet Take 17 g by mouth 2 (two) times daily.     ticagrelor (BRILINTA) 90 MG TABS tablet Take 1 tablet (90 mg total) by mouth 2 (two) times daily.     No current facility-administered medications for this visit.    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)    CVA (cerebral vascular accident) (HCC) 11/12/2022   Diabetes mellitus type 2, uncontrolled, 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   H/O detached retina repair    Hypertension    TIA (transient ischemic attack)     PAST SURGICAL HISTORY: Past Surgical History:  Procedure Laterality Date   ENDARTERECTOMY Left 01/19/2014   Procedure: ENDARTERECTOMY CAROTID-LEFT;  Surgeon: Nada Libman, MD;  Location: [facility] OR;  Service: Vascular;  Laterality: Left;   EYE SURGERY     lens removed from right eye   PATCH ANGIOPLASTY Left 01/19/2014   Procedure: PATCH ANGIOPLASTY, LEFT CAROTID ARTERY USING HEMASHIELD PLATINUM FINESSE PATCH;  Surgeon: Nada Libman, MD;  Location: [facility] OR;  Service: Vascular;  Laterality: Left;   TONSILLECTOMY      FAMILY HISTORY: Family History  Problem Relation Age of Onset  Hypertension Mother    Diabetes Mother    Hypertension Father     SOCIAL HISTORY: Social History   Socioeconomic History   Marital status: Widowed    Spouse name: Not on file   Number of children: 5   Years of education: 2 yrs coll   Highest education level: Not on file  Occupational History   Occupation: retired  Tobacco Use   Smoking status: Former    Current packs/day: 0.00    Types: Cigarettes    Quit date: 02/09/1978    Years since quitting: 45.1   Smokeless tobacco: Never  Substance and Sexual Activity   Alcohol use: No    Alcohol/week: 0.0 standard drinks of alcohol   Drug use: No   Sexual activity: Not on file  Other Topics Concern   Not on file  Social History Narrative   Caffeine (low caffeine), some chocolate.   Social Drivers of Corporate investment banker Strain: Not on file   Food Insecurity: No Food Insecurity (02/11/2023)   Hunger Vital Sign    Worried About Running Out of Food in the Last Year: Never true    Ran Out of Food in the Last Year: Never true  Transportation Needs: No Transportation Needs (02/11/2023)   PRAPARE - Administrator, Civil Service (Medical): No    Lack of Transportation (Non-Medical): No  Physical Activity: Not on file  Stress: Not on file  Social Connections: Moderately Isolated (02/11/2023)   Social Connection and Isolation Panel [NHANES]    Frequency of Communication with Friends and Family: More than three times a week    Frequency of Social Gatherings with Friends and Family: Never    Attends Religious Services: More than 4 times per year    Active Member of [employer] or Organizations: No    Attends Banker Meetings: Never    Marital Status: Widowed  Intimate Partner Violence: Not At Risk (02/11/2023)   Humiliation, Afraid, Rape, and Kick questionnaire    Fear of Current or Ex-Partner: No    Emotionally Abused: No    Physically Abused: No    Sexually Abused: No      Levert Feinstein, M.D. Ph.D.  [facility] [address] [place], [place] [address] Ph: [phone] Fax: [phone]  CC:  Marvel Plan, MD [address] [place],  [place] [address]  Jarrett Soho, PA-C

## 2023-04-15 DIAGNOSIS — I6350 Cerebral infarction due to unspecified occlusion or stenosis of unspecified cerebral artery: Secondary | ICD-10-CM | POA: Diagnosis not present

## 2023-04-15 DIAGNOSIS — N182 Chronic kidney disease, stage 2 (mild): Secondary | ICD-10-CM | POA: Diagnosis not present

## 2023-04-15 DIAGNOSIS — I10 Essential (primary) hypertension: Secondary | ICD-10-CM | POA: Diagnosis not present

## 2023-04-15 DIAGNOSIS — I130 Hypertensive heart and chronic kidney disease with heart failure and stage 1 through stage 4 chronic kidney disease, or unspecified chronic kidney disease: Secondary | ICD-10-CM | POA: Diagnosis not present

## 2023-04-15 DIAGNOSIS — E119 Type 2 diabetes mellitus without complications: Secondary | ICD-10-CM | POA: Diagnosis not present

## 2023-04-16 ENCOUNTER — Encounter: Payer: Medicare HMO | Attending: Physical Medicine & Rehabilitation | Admitting: Physical Medicine & Rehabilitation

## 2023-04-16 ENCOUNTER — Encounter: Payer: Self-pay | Admitting: Physical Medicine & Rehabilitation

## 2023-04-16 VITALS — BP 171/76 | HR 76 | Ht 65.0 in

## 2023-04-16 DIAGNOSIS — R209 Unspecified disturbances of skin sensation: Secondary | ICD-10-CM

## 2023-04-16 DIAGNOSIS — R278 Other lack of coordination: Secondary | ICD-10-CM

## 2023-04-16 DIAGNOSIS — R414 Neurologic neglect syndrome: Secondary | ICD-10-CM | POA: Diagnosis not present

## 2023-04-16 DIAGNOSIS — I6622 Occlusion and stenosis of left posterior cerebral artery: Secondary | ICD-10-CM | POA: Insufficient documentation

## 2023-04-16 DIAGNOSIS — G8194 Hemiplegia, unspecified affecting left nondominant side: Secondary | ICD-10-CM | POA: Diagnosis present

## 2023-04-16 DIAGNOSIS — I69398 Other sequelae of cerebral infarction: Secondary | ICD-10-CM

## 2023-04-16 DIAGNOSIS — H53462 Homonymous bilateral field defects, left side: Secondary | ICD-10-CM | POA: Diagnosis not present

## 2023-04-16 NOTE — Progress Notes (Signed)
 Subjective:    Patient ID: Kathleen Figueroa, female    DOB: [birth year], 88 y.o.   MRN: [id number]  88 y.o. female  who presented to the ED on 02/11/2023 after being found down at home complaining of LUE heaviness and vision deficit. She has history of prior CVA in 2015 with MRI revealed multiple small foci acute infarcts in the left cerebral hemisphere along with chronic infarcts and moderate small vessel ischemic disease there was no major intracranial occlusion. she then underwent left carotid endarterectomy on 01/19/2014. She is maintained on aspirin and Plavix at home and there was some concern for the possibility she was not taking Plavix as instructed. On arrival, CT head without contrast was concerning for a right PCA acute/subacute infarct and neurology was consulted. MRI showed large right PCA territory infarct. Now on aspirin and Brilinta 90 mg BID for 90 days preferred. If medication not covered by insurance, then Brilinta and aspirin for 30 days and then continue on DAPT therapy, aspirin and Plavix.  2D Echo LVEF 60-65% with Grade I diastolic dysfunction. LDL 193. HgbA1c 6.5. VTE prophylaxis - hep SQ. Tolerating regular diet with thin liquids. PMH: TIAs, hypertension, DM-2, PAD, bilateral carotid artery stenosis. Serum creatinine c/w mild CKD. Per PT note on 1/08: Pt continues to be limited by significantly decreased/impaired L attention, sensation and proprioception with pt needing cues throughout session to attend to L side and environment and increased assist on L throughout mobility. Pt able to come to stand with L knee blocked with mod A to boost and max A to pivot toward R to chair. Pt with poor motor planning, unable to shift weight L to step RLE during tranfers despite max multimodal cues and tactile cues to step with R. Patient lives at home with her daughter and son-in-law.  She was independent with a cane and walker prior to this admission.  She had been receiving some intermittent home  health therapy.  Their home is 1 level with level entry.     Admit date: 02/18/2023 Discharge date: 03/15/2023 HPI Patient currently resides in a skilled nursing facility.  She has a history of hypertension and hyperlipidemia .  She is receiving PT, OT services.   Needs help with dressing , showering.   Using a hemiwalker in PT. Patient complains that her OT is not working on ADLs as much No pain complaints Pain Inventory Average Pain 0 Pain Right Now 0 My pain is  No pain  LOCATION OF PAIN  No pain  BOWEL Number of stools per week: 2 Oral laxative use Yes  Type of laxative miralax Enema or suppository use No  History of colostomy No  Incontinent  unknown  BLADDER Pads    Mobility ability to climb steps?  yes do you drive?  no use a wheelchair needs help with transfers Do you have any goals in this area?  yes  Function retired I need assistance with the following:  bathing, toileting, meal prep, household duties, shopping, and currently and [employer] you have any goals in this area?  yes  Neuro/Psych weakness numbness trouble walking  Prior Studies Any changes since last visit?  no  Physicians involved in your care Any changes since last visit?  no   Family History  Problem Relation Age of Onset   Hypertension Mother    Diabetes Mother    Hypertension Father    Social History   Socioeconomic History   Marital status: Widowed    Spouse  name: Not on file   Number of children: 5   Years of education: 2 yrs coll   Highest education level: Not on file  Occupational History   Occupation: retired  Tobacco Use   Smoking status: Former    Current packs/day: 0.00    Types: Cigarettes    Quit date: 02/09/1978    Years since quitting: 45.2   Smokeless tobacco: Never  Substance and Sexual Activity   Alcohol use: No    Alcohol/week: 0.0 standard drinks of alcohol   Drug use: No   Sexual activity: Not on file  Other Topics Concern   Not on file   Social History Narrative   Caffeine (low caffeine), some chocolate.   Social Drivers of Corporate investment banker Strain: Not on file  Food Insecurity: No Food Insecurity (02/11/2023)   Hunger Vital Sign    Worried About Running Out of Food in the Last Year: Never true    Ran Out of Food in the Last Year: Never true  Transportation Needs: No Transportation Needs (02/11/2023)   PRAPARE - Administrator, Civil Service (Medical): No    Lack of Transportation (Non-Medical): No  Physical Activity: Not on file  Stress: Not on file  Social Connections: Moderately Isolated (02/11/2023)   Social Connection and Isolation Panel [NHANES]    Frequency of Communication with Friends and Family: More than three times a week    Frequency of Social Gatherings with Friends and Family: Never    Attends Religious Services: More than 4 times per year    Active Member of [employer] or Organizations: No    Attends Banker Meetings: Never    Marital Status: Widowed   Past Surgical History:  Procedure Laterality Date   ENDARTERECTOMY Left 01/19/2014   Procedure: ENDARTERECTOMY CAROTID-LEFT;  Surgeon: Nada Libman, MD;  Location: [facility] OR;  Service: Vascular;  Laterality: Left;   EYE SURGERY     lens removed from right eye   PATCH ANGIOPLASTY Left 01/19/2014   Procedure: PATCH ANGIOPLASTY, LEFT CAROTID ARTERY USING HEMASHIELD PLATINUM FINESSE PATCH;  Surgeon: Nada Libman, MD;  Location: [facility] OR;  Service: Vascular;  Laterality: Left;   TONSILLECTOMY     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  Opioid Risk Score:   Fall Risk Score:  `1  Depression screen PHQ 2/9      No data to display          Review of Systems  Musculoskeletal:  Positive for gait problem.  Neurological:  Positive for weakness and numbness.  All other systems reviewed and are negative.      Objective:   Physical Exam  General no acute distress Mood affect appropriate Extremities without edema Motor strength is 4/5 in the left deltoid bicep tricep grip hip flexor knee extensor ankle dorsiflexor right side is 5/5 There is a sensory ataxia  left upper extremity with finger-nose-finger There is a dense left field cut as well as a left neglect.  Sensation absent to light touch in the left upper and left lower limb as well as pinch in the left upper Response to questions are delayed speech without dysarthria     Assessment & Plan:   1.  Large right PCA distribution infarct with left hemiparesis severe sensory deficits severe left neglect as well as homonymous hemianopsia.  Making progress as expected given the size of stroke and age. Continue with SNF level PT OT Physical medicine rehab follow-up in 6 months Follow-up with neurology, was seen last week

## 2023-04-18 DIAGNOSIS — I639 Cerebral infarction, unspecified: Secondary | ICD-10-CM | POA: Diagnosis not present

## 2023-04-18 DIAGNOSIS — R1312 Dysphagia, oropharyngeal phase: Secondary | ICD-10-CM | POA: Diagnosis not present

## 2023-04-18 DIAGNOSIS — M6281 Muscle weakness (generalized): Secondary | ICD-10-CM | POA: Diagnosis not present

## 2023-04-18 DIAGNOSIS — R2689 Other abnormalities of gait and mobility: Secondary | ICD-10-CM | POA: Diagnosis not present

## 2023-04-18 DIAGNOSIS — R278 Other lack of coordination: Secondary | ICD-10-CM | POA: Diagnosis not present

## 2023-04-18 DIAGNOSIS — I63331 Cerebral infarction due to thrombosis of right posterior cerebral artery: Secondary | ICD-10-CM | POA: Diagnosis not present

## 2023-04-18 DIAGNOSIS — R41841 Cognitive communication deficit: Secondary | ICD-10-CM | POA: Diagnosis not present

## 2023-04-19 DIAGNOSIS — I63331 Cerebral infarction due to thrombosis of right posterior cerebral artery: Secondary | ICD-10-CM | POA: Diagnosis not present

## 2023-04-19 DIAGNOSIS — R2689 Other abnormalities of gait and mobility: Secondary | ICD-10-CM | POA: Diagnosis not present

## 2023-04-19 DIAGNOSIS — I639 Cerebral infarction, unspecified: Secondary | ICD-10-CM | POA: Diagnosis not present

## 2023-04-19 DIAGNOSIS — R1312 Dysphagia, oropharyngeal phase: Secondary | ICD-10-CM | POA: Diagnosis not present

## 2023-04-19 DIAGNOSIS — R278 Other lack of coordination: Secondary | ICD-10-CM | POA: Diagnosis not present

## 2023-04-19 DIAGNOSIS — M6281 Muscle weakness (generalized): Secondary | ICD-10-CM | POA: Diagnosis not present

## 2023-04-19 DIAGNOSIS — R41841 Cognitive communication deficit: Secondary | ICD-10-CM | POA: Diagnosis not present

## 2023-04-20 DIAGNOSIS — M6281 Muscle weakness (generalized): Secondary | ICD-10-CM | POA: Diagnosis not present

## 2023-04-20 DIAGNOSIS — R2689 Other abnormalities of gait and mobility: Secondary | ICD-10-CM | POA: Diagnosis not present

## 2023-04-20 DIAGNOSIS — I639 Cerebral infarction, unspecified: Secondary | ICD-10-CM | POA: Diagnosis not present

## 2023-04-20 DIAGNOSIS — R278 Other lack of coordination: Secondary | ICD-10-CM | POA: Diagnosis not present

## 2023-04-20 DIAGNOSIS — R41841 Cognitive communication deficit: Secondary | ICD-10-CM | POA: Diagnosis not present

## 2023-04-20 DIAGNOSIS — R1312 Dysphagia, oropharyngeal phase: Secondary | ICD-10-CM | POA: Diagnosis not present

## 2023-04-20 DIAGNOSIS — I63331 Cerebral infarction due to thrombosis of right posterior cerebral artery: Secondary | ICD-10-CM | POA: Diagnosis not present

## 2023-04-21 DIAGNOSIS — R2689 Other abnormalities of gait and mobility: Secondary | ICD-10-CM | POA: Diagnosis not present

## 2023-04-21 DIAGNOSIS — I63331 Cerebral infarction due to thrombosis of right posterior cerebral artery: Secondary | ICD-10-CM | POA: Diagnosis not present

## 2023-04-21 DIAGNOSIS — R41841 Cognitive communication deficit: Secondary | ICD-10-CM | POA: Diagnosis not present

## 2023-04-21 DIAGNOSIS — R1312 Dysphagia, oropharyngeal phase: Secondary | ICD-10-CM | POA: Diagnosis not present

## 2023-04-21 DIAGNOSIS — I639 Cerebral infarction, unspecified: Secondary | ICD-10-CM | POA: Diagnosis not present

## 2023-04-21 DIAGNOSIS — M6281 Muscle weakness (generalized): Secondary | ICD-10-CM | POA: Diagnosis not present

## 2023-04-21 DIAGNOSIS — R278 Other lack of coordination: Secondary | ICD-10-CM | POA: Diagnosis not present

## 2023-04-23 DIAGNOSIS — R278 Other lack of coordination: Secondary | ICD-10-CM | POA: Diagnosis not present

## 2023-04-23 DIAGNOSIS — I63331 Cerebral infarction due to thrombosis of right posterior cerebral artery: Secondary | ICD-10-CM | POA: Diagnosis not present

## 2023-04-23 DIAGNOSIS — R1312 Dysphagia, oropharyngeal phase: Secondary | ICD-10-CM | POA: Diagnosis not present

## 2023-04-23 DIAGNOSIS — R41841 Cognitive communication deficit: Secondary | ICD-10-CM | POA: Diagnosis not present

## 2023-04-23 DIAGNOSIS — I639 Cerebral infarction, unspecified: Secondary | ICD-10-CM | POA: Diagnosis not present

## 2023-04-23 DIAGNOSIS — M6281 Muscle weakness (generalized): Secondary | ICD-10-CM | POA: Diagnosis not present

## 2023-04-23 DIAGNOSIS — R2689 Other abnormalities of gait and mobility: Secondary | ICD-10-CM | POA: Diagnosis not present

## 2023-04-23 DIAGNOSIS — M792 Neuralgia and neuritis, unspecified: Secondary | ICD-10-CM | POA: Diagnosis not present

## 2023-04-23 DIAGNOSIS — G8929 Other chronic pain: Secondary | ICD-10-CM | POA: Diagnosis not present

## 2023-04-23 DIAGNOSIS — I69354 Hemiplegia and hemiparesis following cerebral infarction affecting left non-dominant side: Secondary | ICD-10-CM | POA: Diagnosis not present

## 2023-04-24 DIAGNOSIS — R1312 Dysphagia, oropharyngeal phase: Secondary | ICD-10-CM | POA: Diagnosis not present

## 2023-04-24 DIAGNOSIS — I639 Cerebral infarction, unspecified: Secondary | ICD-10-CM | POA: Diagnosis not present

## 2023-04-24 DIAGNOSIS — R2689 Other abnormalities of gait and mobility: Secondary | ICD-10-CM | POA: Diagnosis not present

## 2023-04-24 DIAGNOSIS — M6281 Muscle weakness (generalized): Secondary | ICD-10-CM | POA: Diagnosis not present

## 2023-04-24 DIAGNOSIS — R278 Other lack of coordination: Secondary | ICD-10-CM | POA: Diagnosis not present

## 2023-04-24 DIAGNOSIS — R41841 Cognitive communication deficit: Secondary | ICD-10-CM | POA: Diagnosis not present

## 2023-04-24 DIAGNOSIS — I63331 Cerebral infarction due to thrombosis of right posterior cerebral artery: Secondary | ICD-10-CM | POA: Diagnosis not present

## 2023-04-25 DIAGNOSIS — R41841 Cognitive communication deficit: Secondary | ICD-10-CM | POA: Diagnosis not present

## 2023-04-25 DIAGNOSIS — R1312 Dysphagia, oropharyngeal phase: Secondary | ICD-10-CM | POA: Diagnosis not present

## 2023-04-25 DIAGNOSIS — I639 Cerebral infarction, unspecified: Secondary | ICD-10-CM | POA: Diagnosis not present

## 2023-04-25 DIAGNOSIS — R2689 Other abnormalities of gait and mobility: Secondary | ICD-10-CM | POA: Diagnosis not present

## 2023-04-25 DIAGNOSIS — R278 Other lack of coordination: Secondary | ICD-10-CM | POA: Diagnosis not present

## 2023-04-25 DIAGNOSIS — I63331 Cerebral infarction due to thrombosis of right posterior cerebral artery: Secondary | ICD-10-CM | POA: Diagnosis not present

## 2023-04-25 DIAGNOSIS — M6281 Muscle weakness (generalized): Secondary | ICD-10-CM | POA: Diagnosis not present

## 2023-04-26 DIAGNOSIS — R1312 Dysphagia, oropharyngeal phase: Secondary | ICD-10-CM | POA: Diagnosis not present

## 2023-04-26 DIAGNOSIS — M6281 Muscle weakness (generalized): Secondary | ICD-10-CM | POA: Diagnosis not present

## 2023-04-26 DIAGNOSIS — R278 Other lack of coordination: Secondary | ICD-10-CM | POA: Diagnosis not present

## 2023-04-26 DIAGNOSIS — I63331 Cerebral infarction due to thrombosis of right posterior cerebral artery: Secondary | ICD-10-CM | POA: Diagnosis not present

## 2023-04-26 DIAGNOSIS — R2689 Other abnormalities of gait and mobility: Secondary | ICD-10-CM | POA: Diagnosis not present

## 2023-04-26 DIAGNOSIS — R41841 Cognitive communication deficit: Secondary | ICD-10-CM | POA: Diagnosis not present

## 2023-04-26 DIAGNOSIS — I639 Cerebral infarction, unspecified: Secondary | ICD-10-CM | POA: Diagnosis not present

## 2023-04-27 DIAGNOSIS — I639 Cerebral infarction, unspecified: Secondary | ICD-10-CM | POA: Diagnosis not present

## 2023-04-27 DIAGNOSIS — I63331 Cerebral infarction due to thrombosis of right posterior cerebral artery: Secondary | ICD-10-CM | POA: Diagnosis not present

## 2023-04-27 DIAGNOSIS — R41841 Cognitive communication deficit: Secondary | ICD-10-CM | POA: Diagnosis not present

## 2023-04-27 DIAGNOSIS — R2689 Other abnormalities of gait and mobility: Secondary | ICD-10-CM | POA: Diagnosis not present

## 2023-04-27 DIAGNOSIS — R278 Other lack of coordination: Secondary | ICD-10-CM | POA: Diagnosis not present

## 2023-04-27 DIAGNOSIS — R1312 Dysphagia, oropharyngeal phase: Secondary | ICD-10-CM | POA: Diagnosis not present

## 2023-04-27 DIAGNOSIS — M6281 Muscle weakness (generalized): Secondary | ICD-10-CM | POA: Diagnosis not present

## 2023-04-28 DIAGNOSIS — R278 Other lack of coordination: Secondary | ICD-10-CM | POA: Diagnosis not present

## 2023-04-28 DIAGNOSIS — I63331 Cerebral infarction due to thrombosis of right posterior cerebral artery: Secondary | ICD-10-CM | POA: Diagnosis not present

## 2023-04-28 DIAGNOSIS — R41841 Cognitive communication deficit: Secondary | ICD-10-CM | POA: Diagnosis not present

## 2023-04-28 DIAGNOSIS — I639 Cerebral infarction, unspecified: Secondary | ICD-10-CM | POA: Diagnosis not present

## 2023-04-28 DIAGNOSIS — R2689 Other abnormalities of gait and mobility: Secondary | ICD-10-CM | POA: Diagnosis not present

## 2023-04-28 DIAGNOSIS — R1312 Dysphagia, oropharyngeal phase: Secondary | ICD-10-CM | POA: Diagnosis not present

## 2023-04-28 DIAGNOSIS — M6281 Muscle weakness (generalized): Secondary | ICD-10-CM | POA: Diagnosis not present

## 2023-04-29 DIAGNOSIS — I130 Hypertensive heart and chronic kidney disease with heart failure and stage 1 through stage 4 chronic kidney disease, or unspecified chronic kidney disease: Secondary | ICD-10-CM | POA: Diagnosis not present

## 2023-04-29 DIAGNOSIS — M6281 Muscle weakness (generalized): Secondary | ICD-10-CM | POA: Diagnosis not present

## 2023-04-29 DIAGNOSIS — E119 Type 2 diabetes mellitus without complications: Secondary | ICD-10-CM | POA: Diagnosis not present

## 2023-04-29 DIAGNOSIS — R278 Other lack of coordination: Secondary | ICD-10-CM | POA: Diagnosis not present

## 2023-04-29 DIAGNOSIS — I6350 Cerebral infarction due to unspecified occlusion or stenosis of unspecified cerebral artery: Secondary | ICD-10-CM | POA: Diagnosis not present

## 2023-04-29 DIAGNOSIS — N182 Chronic kidney disease, stage 2 (mild): Secondary | ICD-10-CM | POA: Diagnosis not present

## 2023-04-29 DIAGNOSIS — R1312 Dysphagia, oropharyngeal phase: Secondary | ICD-10-CM | POA: Diagnosis not present

## 2023-04-29 DIAGNOSIS — I63331 Cerebral infarction due to thrombosis of right posterior cerebral artery: Secondary | ICD-10-CM | POA: Diagnosis not present

## 2023-04-29 DIAGNOSIS — I639 Cerebral infarction, unspecified: Secondary | ICD-10-CM | POA: Diagnosis not present

## 2023-04-29 DIAGNOSIS — I69354 Hemiplegia and hemiparesis following cerebral infarction affecting left non-dominant side: Secondary | ICD-10-CM | POA: Diagnosis not present

## 2023-04-29 DIAGNOSIS — I10 Essential (primary) hypertension: Secondary | ICD-10-CM | POA: Diagnosis not present

## 2023-04-29 DIAGNOSIS — R41841 Cognitive communication deficit: Secondary | ICD-10-CM | POA: Diagnosis not present

## 2023-04-29 DIAGNOSIS — R2689 Other abnormalities of gait and mobility: Secondary | ICD-10-CM | POA: Diagnosis not present

## 2023-05-02 DIAGNOSIS — I130 Hypertensive heart and chronic kidney disease with heart failure and stage 1 through stage 4 chronic kidney disease, or unspecified chronic kidney disease: Secondary | ICD-10-CM | POA: Diagnosis not present

## 2023-05-02 DIAGNOSIS — G8929 Other chronic pain: Secondary | ICD-10-CM | POA: Diagnosis not present

## 2023-05-02 DIAGNOSIS — H538 Other visual disturbances: Secondary | ICD-10-CM | POA: Diagnosis not present

## 2023-05-02 DIAGNOSIS — M25512 Pain in left shoulder: Secondary | ICD-10-CM | POA: Diagnosis not present

## 2023-05-02 DIAGNOSIS — I69354 Hemiplegia and hemiparesis following cerebral infarction affecting left non-dominant side: Secondary | ICD-10-CM | POA: Diagnosis not present

## 2023-05-02 DIAGNOSIS — I69398 Other sequelae of cerebral infarction: Secondary | ICD-10-CM | POA: Diagnosis not present

## 2023-05-02 DIAGNOSIS — E1151 Type 2 diabetes mellitus with diabetic peripheral angiopathy without gangrene: Secondary | ICD-10-CM | POA: Diagnosis not present

## 2023-05-02 DIAGNOSIS — M792 Neuralgia and neuritis, unspecified: Secondary | ICD-10-CM | POA: Diagnosis not present

## 2023-05-02 DIAGNOSIS — E1140 Type 2 diabetes mellitus with diabetic neuropathy, unspecified: Secondary | ICD-10-CM | POA: Diagnosis not present

## 2023-05-03 DIAGNOSIS — G8929 Other chronic pain: Secondary | ICD-10-CM | POA: Diagnosis not present

## 2023-05-03 DIAGNOSIS — H538 Other visual disturbances: Secondary | ICD-10-CM | POA: Diagnosis not present

## 2023-05-03 DIAGNOSIS — I130 Hypertensive heart and chronic kidney disease with heart failure and stage 1 through stage 4 chronic kidney disease, or unspecified chronic kidney disease: Secondary | ICD-10-CM | POA: Diagnosis not present

## 2023-05-03 DIAGNOSIS — M25512 Pain in left shoulder: Secondary | ICD-10-CM | POA: Diagnosis not present

## 2023-05-03 DIAGNOSIS — I69354 Hemiplegia and hemiparesis following cerebral infarction affecting left non-dominant side: Secondary | ICD-10-CM | POA: Diagnosis not present

## 2023-05-03 DIAGNOSIS — E1140 Type 2 diabetes mellitus with diabetic neuropathy, unspecified: Secondary | ICD-10-CM | POA: Diagnosis not present

## 2023-05-03 DIAGNOSIS — M792 Neuralgia and neuritis, unspecified: Secondary | ICD-10-CM | POA: Diagnosis not present

## 2023-05-03 DIAGNOSIS — I69398 Other sequelae of cerebral infarction: Secondary | ICD-10-CM | POA: Diagnosis not present

## 2023-05-03 DIAGNOSIS — E1151 Type 2 diabetes mellitus with diabetic peripheral angiopathy without gangrene: Secondary | ICD-10-CM | POA: Diagnosis not present

## 2023-05-05 DIAGNOSIS — I130 Hypertensive heart and chronic kidney disease with heart failure and stage 1 through stage 4 chronic kidney disease, or unspecified chronic kidney disease: Secondary | ICD-10-CM | POA: Diagnosis not present

## 2023-05-05 DIAGNOSIS — H538 Other visual disturbances: Secondary | ICD-10-CM | POA: Diagnosis not present

## 2023-05-05 DIAGNOSIS — E1151 Type 2 diabetes mellitus with diabetic peripheral angiopathy without gangrene: Secondary | ICD-10-CM | POA: Diagnosis not present

## 2023-05-05 DIAGNOSIS — M792 Neuralgia and neuritis, unspecified: Secondary | ICD-10-CM | POA: Diagnosis not present

## 2023-05-05 DIAGNOSIS — E1140 Type 2 diabetes mellitus with diabetic neuropathy, unspecified: Secondary | ICD-10-CM | POA: Diagnosis not present

## 2023-05-05 DIAGNOSIS — I69354 Hemiplegia and hemiparesis following cerebral infarction affecting left non-dominant side: Secondary | ICD-10-CM | POA: Diagnosis not present

## 2023-05-05 DIAGNOSIS — M25512 Pain in left shoulder: Secondary | ICD-10-CM | POA: Diagnosis not present

## 2023-05-05 DIAGNOSIS — G8929 Other chronic pain: Secondary | ICD-10-CM | POA: Diagnosis not present

## 2023-05-05 DIAGNOSIS — I69398 Other sequelae of cerebral infarction: Secondary | ICD-10-CM | POA: Diagnosis not present

## 2023-05-10 DIAGNOSIS — E1151 Type 2 diabetes mellitus with diabetic peripheral angiopathy without gangrene: Secondary | ICD-10-CM | POA: Diagnosis not present

## 2023-05-10 DIAGNOSIS — M792 Neuralgia and neuritis, unspecified: Secondary | ICD-10-CM | POA: Diagnosis not present

## 2023-05-10 DIAGNOSIS — I69354 Hemiplegia and hemiparesis following cerebral infarction affecting left non-dominant side: Secondary | ICD-10-CM | POA: Diagnosis not present

## 2023-05-10 DIAGNOSIS — I130 Hypertensive heart and chronic kidney disease with heart failure and stage 1 through stage 4 chronic kidney disease, or unspecified chronic kidney disease: Secondary | ICD-10-CM | POA: Diagnosis not present

## 2023-05-10 DIAGNOSIS — H538 Other visual disturbances: Secondary | ICD-10-CM | POA: Diagnosis not present

## 2023-05-10 DIAGNOSIS — G8929 Other chronic pain: Secondary | ICD-10-CM | POA: Diagnosis not present

## 2023-05-10 DIAGNOSIS — I69398 Other sequelae of cerebral infarction: Secondary | ICD-10-CM | POA: Diagnosis not present

## 2023-05-10 DIAGNOSIS — M25512 Pain in left shoulder: Secondary | ICD-10-CM | POA: Diagnosis not present

## 2023-05-10 DIAGNOSIS — E1140 Type 2 diabetes mellitus with diabetic neuropathy, unspecified: Secondary | ICD-10-CM | POA: Diagnosis not present

## 2023-05-11 DIAGNOSIS — E1151 Type 2 diabetes mellitus with diabetic peripheral angiopathy without gangrene: Secondary | ICD-10-CM | POA: Diagnosis not present

## 2023-05-11 DIAGNOSIS — H538 Other visual disturbances: Secondary | ICD-10-CM | POA: Diagnosis not present

## 2023-05-11 DIAGNOSIS — M792 Neuralgia and neuritis, unspecified: Secondary | ICD-10-CM | POA: Diagnosis not present

## 2023-05-11 DIAGNOSIS — I69354 Hemiplegia and hemiparesis following cerebral infarction affecting left non-dominant side: Secondary | ICD-10-CM | POA: Diagnosis not present

## 2023-05-11 DIAGNOSIS — I69398 Other sequelae of cerebral infarction: Secondary | ICD-10-CM | POA: Diagnosis not present

## 2023-05-11 DIAGNOSIS — M25512 Pain in left shoulder: Secondary | ICD-10-CM | POA: Diagnosis not present

## 2023-05-11 DIAGNOSIS — I130 Hypertensive heart and chronic kidney disease with heart failure and stage 1 through stage 4 chronic kidney disease, or unspecified chronic kidney disease: Secondary | ICD-10-CM | POA: Diagnosis not present

## 2023-05-11 DIAGNOSIS — G8929 Other chronic pain: Secondary | ICD-10-CM | POA: Diagnosis not present

## 2023-05-11 DIAGNOSIS — E1140 Type 2 diabetes mellitus with diabetic neuropathy, unspecified: Secondary | ICD-10-CM | POA: Diagnosis not present

## 2023-05-12 DIAGNOSIS — H538 Other visual disturbances: Secondary | ICD-10-CM | POA: Diagnosis not present

## 2023-05-12 DIAGNOSIS — G8929 Other chronic pain: Secondary | ICD-10-CM | POA: Diagnosis not present

## 2023-05-12 DIAGNOSIS — I69354 Hemiplegia and hemiparesis following cerebral infarction affecting left non-dominant side: Secondary | ICD-10-CM | POA: Diagnosis not present

## 2023-05-12 DIAGNOSIS — E1151 Type 2 diabetes mellitus with diabetic peripheral angiopathy without gangrene: Secondary | ICD-10-CM | POA: Diagnosis not present

## 2023-05-12 DIAGNOSIS — M25512 Pain in left shoulder: Secondary | ICD-10-CM | POA: Diagnosis not present

## 2023-05-12 DIAGNOSIS — I130 Hypertensive heart and chronic kidney disease with heart failure and stage 1 through stage 4 chronic kidney disease, or unspecified chronic kidney disease: Secondary | ICD-10-CM | POA: Diagnosis not present

## 2023-05-12 DIAGNOSIS — M792 Neuralgia and neuritis, unspecified: Secondary | ICD-10-CM | POA: Diagnosis not present

## 2023-05-12 DIAGNOSIS — I69398 Other sequelae of cerebral infarction: Secondary | ICD-10-CM | POA: Diagnosis not present

## 2023-05-12 DIAGNOSIS — E1140 Type 2 diabetes mellitus with diabetic neuropathy, unspecified: Secondary | ICD-10-CM | POA: Diagnosis not present

## 2023-05-14 DIAGNOSIS — I130 Hypertensive heart and chronic kidney disease with heart failure and stage 1 through stage 4 chronic kidney disease, or unspecified chronic kidney disease: Secondary | ICD-10-CM | POA: Diagnosis not present

## 2023-05-14 DIAGNOSIS — I69398 Other sequelae of cerebral infarction: Secondary | ICD-10-CM | POA: Diagnosis not present

## 2023-05-14 DIAGNOSIS — H538 Other visual disturbances: Secondary | ICD-10-CM | POA: Diagnosis not present

## 2023-05-14 DIAGNOSIS — M792 Neuralgia and neuritis, unspecified: Secondary | ICD-10-CM | POA: Diagnosis not present

## 2023-05-14 DIAGNOSIS — E1151 Type 2 diabetes mellitus with diabetic peripheral angiopathy without gangrene: Secondary | ICD-10-CM | POA: Diagnosis not present

## 2023-05-14 DIAGNOSIS — G8929 Other chronic pain: Secondary | ICD-10-CM | POA: Diagnosis not present

## 2023-05-14 DIAGNOSIS — M25512 Pain in left shoulder: Secondary | ICD-10-CM | POA: Diagnosis not present

## 2023-05-14 DIAGNOSIS — E1140 Type 2 diabetes mellitus with diabetic neuropathy, unspecified: Secondary | ICD-10-CM | POA: Diagnosis not present

## 2023-05-14 DIAGNOSIS — I69354 Hemiplegia and hemiparesis following cerebral infarction affecting left non-dominant side: Secondary | ICD-10-CM | POA: Diagnosis not present

## 2023-05-17 DIAGNOSIS — G8929 Other chronic pain: Secondary | ICD-10-CM | POA: Diagnosis not present

## 2023-05-17 DIAGNOSIS — M792 Neuralgia and neuritis, unspecified: Secondary | ICD-10-CM | POA: Diagnosis not present

## 2023-05-17 DIAGNOSIS — I69354 Hemiplegia and hemiparesis following cerebral infarction affecting left non-dominant side: Secondary | ICD-10-CM | POA: Diagnosis not present

## 2023-05-17 DIAGNOSIS — E1140 Type 2 diabetes mellitus with diabetic neuropathy, unspecified: Secondary | ICD-10-CM | POA: Diagnosis not present

## 2023-05-17 DIAGNOSIS — H538 Other visual disturbances: Secondary | ICD-10-CM | POA: Diagnosis not present

## 2023-05-17 DIAGNOSIS — E1151 Type 2 diabetes mellitus with diabetic peripheral angiopathy without gangrene: Secondary | ICD-10-CM | POA: Diagnosis not present

## 2023-05-17 DIAGNOSIS — I130 Hypertensive heart and chronic kidney disease with heart failure and stage 1 through stage 4 chronic kidney disease, or unspecified chronic kidney disease: Secondary | ICD-10-CM | POA: Diagnosis not present

## 2023-05-17 DIAGNOSIS — M25512 Pain in left shoulder: Secondary | ICD-10-CM | POA: Diagnosis not present

## 2023-05-17 DIAGNOSIS — I69398 Other sequelae of cerebral infarction: Secondary | ICD-10-CM | POA: Diagnosis not present

## 2023-05-19 DIAGNOSIS — I69354 Hemiplegia and hemiparesis following cerebral infarction affecting left non-dominant side: Secondary | ICD-10-CM | POA: Diagnosis not present

## 2023-05-19 DIAGNOSIS — I130 Hypertensive heart and chronic kidney disease with heart failure and stage 1 through stage 4 chronic kidney disease, or unspecified chronic kidney disease: Secondary | ICD-10-CM | POA: Diagnosis not present

## 2023-05-19 DIAGNOSIS — M792 Neuralgia and neuritis, unspecified: Secondary | ICD-10-CM | POA: Diagnosis not present

## 2023-05-19 DIAGNOSIS — M25512 Pain in left shoulder: Secondary | ICD-10-CM | POA: Diagnosis not present

## 2023-05-19 DIAGNOSIS — I69398 Other sequelae of cerebral infarction: Secondary | ICD-10-CM | POA: Diagnosis not present

## 2023-05-19 DIAGNOSIS — E1140 Type 2 diabetes mellitus with diabetic neuropathy, unspecified: Secondary | ICD-10-CM | POA: Diagnosis not present

## 2023-05-19 DIAGNOSIS — H538 Other visual disturbances: Secondary | ICD-10-CM | POA: Diagnosis not present

## 2023-05-19 DIAGNOSIS — I10 Essential (primary) hypertension: Secondary | ICD-10-CM | POA: Diagnosis not present

## 2023-05-19 DIAGNOSIS — M1712 Unilateral primary osteoarthritis, left knee: Secondary | ICD-10-CM | POA: Diagnosis not present

## 2023-05-19 DIAGNOSIS — E1169 Type 2 diabetes mellitus with other specified complication: Secondary | ICD-10-CM | POA: Diagnosis not present

## 2023-05-19 DIAGNOSIS — E1151 Type 2 diabetes mellitus with diabetic peripheral angiopathy without gangrene: Secondary | ICD-10-CM | POA: Diagnosis not present

## 2023-05-19 DIAGNOSIS — I700 Atherosclerosis of aorta: Secondary | ICD-10-CM | POA: Diagnosis not present

## 2023-05-19 DIAGNOSIS — G8929 Other chronic pain: Secondary | ICD-10-CM | POA: Diagnosis not present

## 2023-05-19 DIAGNOSIS — I63532 Cerebral infarction due to unspecified occlusion or stenosis of left posterior cerebral artery: Secondary | ICD-10-CM | POA: Diagnosis not present

## 2023-05-19 DIAGNOSIS — N1832 Chronic kidney disease, stage 3b: Secondary | ICD-10-CM | POA: Diagnosis not present

## 2023-05-19 DIAGNOSIS — I779 Disorder of arteries and arterioles, unspecified: Secondary | ICD-10-CM | POA: Diagnosis not present

## 2023-05-20 ENCOUNTER — Other Ambulatory Visit: Payer: Self-pay | Admitting: Family Medicine

## 2023-05-20 ENCOUNTER — Ambulatory Visit
Admission: RE | Admit: 2023-05-20 | Discharge: 2023-05-20 | Disposition: A | Discharge status: Home / Self Care | Source: Ambulatory Visit | Attending: Family Medicine | Admitting: Family Medicine

## 2023-05-20 DIAGNOSIS — I69354 Hemiplegia and hemiparesis following cerebral infarction affecting left non-dominant side: Secondary | ICD-10-CM | POA: Diagnosis not present

## 2023-05-20 DIAGNOSIS — E1151 Type 2 diabetes mellitus with diabetic peripheral angiopathy without gangrene: Secondary | ICD-10-CM | POA: Diagnosis not present

## 2023-05-20 DIAGNOSIS — G8929 Other chronic pain: Secondary | ICD-10-CM | POA: Diagnosis not present

## 2023-05-20 DIAGNOSIS — R0689 Other abnormalities of breathing: Secondary | ICD-10-CM | POA: Diagnosis not present

## 2023-05-20 DIAGNOSIS — R069 Unspecified abnormalities of breathing: Secondary | ICD-10-CM

## 2023-05-20 DIAGNOSIS — E1140 Type 2 diabetes mellitus with diabetic neuropathy, unspecified: Secondary | ICD-10-CM | POA: Diagnosis not present

## 2023-05-20 DIAGNOSIS — H538 Other visual disturbances: Secondary | ICD-10-CM | POA: Diagnosis not present

## 2023-05-20 DIAGNOSIS — M25512 Pain in left shoulder: Secondary | ICD-10-CM | POA: Diagnosis not present

## 2023-05-20 DIAGNOSIS — I69398 Other sequelae of cerebral infarction: Secondary | ICD-10-CM | POA: Diagnosis not present

## 2023-05-20 DIAGNOSIS — I130 Hypertensive heart and chronic kidney disease with heart failure and stage 1 through stage 4 chronic kidney disease, or unspecified chronic kidney disease: Secondary | ICD-10-CM | POA: Diagnosis not present

## 2023-05-20 DIAGNOSIS — M792 Neuralgia and neuritis, unspecified: Secondary | ICD-10-CM | POA: Diagnosis not present

## 2023-05-24 DIAGNOSIS — M25512 Pain in left shoulder: Secondary | ICD-10-CM | POA: Diagnosis not present

## 2023-05-24 DIAGNOSIS — G8929 Other chronic pain: Secondary | ICD-10-CM | POA: Diagnosis not present

## 2023-05-24 DIAGNOSIS — I130 Hypertensive heart and chronic kidney disease with heart failure and stage 1 through stage 4 chronic kidney disease, or unspecified chronic kidney disease: Secondary | ICD-10-CM | POA: Diagnosis not present

## 2023-05-24 DIAGNOSIS — M792 Neuralgia and neuritis, unspecified: Secondary | ICD-10-CM | POA: Diagnosis not present

## 2023-05-24 DIAGNOSIS — I69354 Hemiplegia and hemiparesis following cerebral infarction affecting left non-dominant side: Secondary | ICD-10-CM | POA: Diagnosis not present

## 2023-05-24 DIAGNOSIS — H538 Other visual disturbances: Secondary | ICD-10-CM | POA: Diagnosis not present

## 2023-05-24 DIAGNOSIS — I69398 Other sequelae of cerebral infarction: Secondary | ICD-10-CM | POA: Diagnosis not present

## 2023-05-24 DIAGNOSIS — E1151 Type 2 diabetes mellitus with diabetic peripheral angiopathy without gangrene: Secondary | ICD-10-CM | POA: Diagnosis not present

## 2023-05-24 DIAGNOSIS — E1140 Type 2 diabetes mellitus with diabetic neuropathy, unspecified: Secondary | ICD-10-CM | POA: Diagnosis not present

## 2023-05-25 DIAGNOSIS — I69398 Other sequelae of cerebral infarction: Secondary | ICD-10-CM | POA: Diagnosis not present

## 2023-05-25 DIAGNOSIS — H538 Other visual disturbances: Secondary | ICD-10-CM | POA: Diagnosis not present

## 2023-05-25 DIAGNOSIS — M792 Neuralgia and neuritis, unspecified: Secondary | ICD-10-CM | POA: Diagnosis not present

## 2023-05-25 DIAGNOSIS — M25512 Pain in left shoulder: Secondary | ICD-10-CM | POA: Diagnosis not present

## 2023-05-25 DIAGNOSIS — G8929 Other chronic pain: Secondary | ICD-10-CM | POA: Diagnosis not present

## 2023-05-25 DIAGNOSIS — I130 Hypertensive heart and chronic kidney disease with heart failure and stage 1 through stage 4 chronic kidney disease, or unspecified chronic kidney disease: Secondary | ICD-10-CM | POA: Diagnosis not present

## 2023-05-25 DIAGNOSIS — I69354 Hemiplegia and hemiparesis following cerebral infarction affecting left non-dominant side: Secondary | ICD-10-CM | POA: Diagnosis not present

## 2023-05-25 DIAGNOSIS — E1151 Type 2 diabetes mellitus with diabetic peripheral angiopathy without gangrene: Secondary | ICD-10-CM | POA: Diagnosis not present

## 2023-05-25 DIAGNOSIS — E1140 Type 2 diabetes mellitus with diabetic neuropathy, unspecified: Secondary | ICD-10-CM | POA: Diagnosis not present

## 2023-05-26 DIAGNOSIS — M792 Neuralgia and neuritis, unspecified: Secondary | ICD-10-CM | POA: Diagnosis not present

## 2023-05-26 DIAGNOSIS — H538 Other visual disturbances: Secondary | ICD-10-CM | POA: Diagnosis not present

## 2023-05-26 DIAGNOSIS — E1140 Type 2 diabetes mellitus with diabetic neuropathy, unspecified: Secondary | ICD-10-CM | POA: Diagnosis not present

## 2023-05-26 DIAGNOSIS — M25512 Pain in left shoulder: Secondary | ICD-10-CM | POA: Diagnosis not present

## 2023-05-26 DIAGNOSIS — I130 Hypertensive heart and chronic kidney disease with heart failure and stage 1 through stage 4 chronic kidney disease, or unspecified chronic kidney disease: Secondary | ICD-10-CM | POA: Diagnosis not present

## 2023-05-26 DIAGNOSIS — I69354 Hemiplegia and hemiparesis following cerebral infarction affecting left non-dominant side: Secondary | ICD-10-CM | POA: Diagnosis not present

## 2023-05-26 DIAGNOSIS — G8929 Other chronic pain: Secondary | ICD-10-CM | POA: Diagnosis not present

## 2023-05-26 DIAGNOSIS — E1151 Type 2 diabetes mellitus with diabetic peripheral angiopathy without gangrene: Secondary | ICD-10-CM | POA: Diagnosis not present

## 2023-05-26 DIAGNOSIS — I69398 Other sequelae of cerebral infarction: Secondary | ICD-10-CM | POA: Diagnosis not present

## 2023-05-28 DIAGNOSIS — M25512 Pain in left shoulder: Secondary | ICD-10-CM | POA: Diagnosis not present

## 2023-05-28 DIAGNOSIS — H538 Other visual disturbances: Secondary | ICD-10-CM | POA: Diagnosis not present

## 2023-05-28 DIAGNOSIS — I69354 Hemiplegia and hemiparesis following cerebral infarction affecting left non-dominant side: Secondary | ICD-10-CM | POA: Diagnosis not present

## 2023-05-28 DIAGNOSIS — I69398 Other sequelae of cerebral infarction: Secondary | ICD-10-CM | POA: Diagnosis not present

## 2023-05-28 DIAGNOSIS — E1140 Type 2 diabetes mellitus with diabetic neuropathy, unspecified: Secondary | ICD-10-CM | POA: Diagnosis not present

## 2023-05-28 DIAGNOSIS — I130 Hypertensive heart and chronic kidney disease with heart failure and stage 1 through stage 4 chronic kidney disease, or unspecified chronic kidney disease: Secondary | ICD-10-CM | POA: Diagnosis not present

## 2023-05-28 DIAGNOSIS — E1151 Type 2 diabetes mellitus with diabetic peripheral angiopathy without gangrene: Secondary | ICD-10-CM | POA: Diagnosis not present

## 2023-05-28 DIAGNOSIS — M792 Neuralgia and neuritis, unspecified: Secondary | ICD-10-CM | POA: Diagnosis not present

## 2023-05-28 DIAGNOSIS — G8929 Other chronic pain: Secondary | ICD-10-CM | POA: Diagnosis not present

## 2023-05-31 DIAGNOSIS — I63532 Cerebral infarction due to unspecified occlusion or stenosis of left posterior cerebral artery: Secondary | ICD-10-CM | POA: Diagnosis not present

## 2023-05-31 DIAGNOSIS — M1712 Unilateral primary osteoarthritis, left knee: Secondary | ICD-10-CM | POA: Diagnosis not present

## 2023-05-31 DIAGNOSIS — E1169 Type 2 diabetes mellitus with other specified complication: Secondary | ICD-10-CM | POA: Diagnosis not present

## 2023-05-31 DIAGNOSIS — N1832 Chronic kidney disease, stage 3b: Secondary | ICD-10-CM | POA: Diagnosis not present

## 2023-05-31 DIAGNOSIS — R9389 Abnormal findings on diagnostic imaging of other specified body structures: Secondary | ICD-10-CM | POA: Diagnosis not present

## 2023-05-31 DIAGNOSIS — M25512 Pain in left shoulder: Secondary | ICD-10-CM | POA: Diagnosis not present

## 2023-06-02 DIAGNOSIS — I130 Hypertensive heart and chronic kidney disease with heart failure and stage 1 through stage 4 chronic kidney disease, or unspecified chronic kidney disease: Secondary | ICD-10-CM | POA: Diagnosis not present

## 2023-06-02 DIAGNOSIS — G8929 Other chronic pain: Secondary | ICD-10-CM | POA: Diagnosis not present

## 2023-06-02 DIAGNOSIS — E1151 Type 2 diabetes mellitus with diabetic peripheral angiopathy without gangrene: Secondary | ICD-10-CM | POA: Diagnosis not present

## 2023-06-02 DIAGNOSIS — M25512 Pain in left shoulder: Secondary | ICD-10-CM | POA: Diagnosis not present

## 2023-06-02 DIAGNOSIS — I69398 Other sequelae of cerebral infarction: Secondary | ICD-10-CM | POA: Diagnosis not present

## 2023-06-02 DIAGNOSIS — E1140 Type 2 diabetes mellitus with diabetic neuropathy, unspecified: Secondary | ICD-10-CM | POA: Diagnosis not present

## 2023-06-02 DIAGNOSIS — M792 Neuralgia and neuritis, unspecified: Secondary | ICD-10-CM | POA: Diagnosis not present

## 2023-06-02 DIAGNOSIS — I69354 Hemiplegia and hemiparesis following cerebral infarction affecting left non-dominant side: Secondary | ICD-10-CM | POA: Diagnosis not present

## 2023-06-02 DIAGNOSIS — H538 Other visual disturbances: Secondary | ICD-10-CM | POA: Diagnosis not present

## 2023-06-04 DIAGNOSIS — G8929 Other chronic pain: Secondary | ICD-10-CM | POA: Diagnosis not present

## 2023-06-04 DIAGNOSIS — H538 Other visual disturbances: Secondary | ICD-10-CM | POA: Diagnosis not present

## 2023-06-04 DIAGNOSIS — I69398 Other sequelae of cerebral infarction: Secondary | ICD-10-CM | POA: Diagnosis not present

## 2023-06-04 DIAGNOSIS — M25512 Pain in left shoulder: Secondary | ICD-10-CM | POA: Diagnosis not present

## 2023-06-04 DIAGNOSIS — I69354 Hemiplegia and hemiparesis following cerebral infarction affecting left non-dominant side: Secondary | ICD-10-CM | POA: Diagnosis not present

## 2023-06-04 DIAGNOSIS — E1140 Type 2 diabetes mellitus with diabetic neuropathy, unspecified: Secondary | ICD-10-CM | POA: Diagnosis not present

## 2023-06-04 DIAGNOSIS — M792 Neuralgia and neuritis, unspecified: Secondary | ICD-10-CM | POA: Diagnosis not present

## 2023-06-04 DIAGNOSIS — E1151 Type 2 diabetes mellitus with diabetic peripheral angiopathy without gangrene: Secondary | ICD-10-CM | POA: Diagnosis not present

## 2023-06-04 DIAGNOSIS — I130 Hypertensive heart and chronic kidney disease with heart failure and stage 1 through stage 4 chronic kidney disease, or unspecified chronic kidney disease: Secondary | ICD-10-CM | POA: Diagnosis not present

## 2023-06-08 DIAGNOSIS — I69398 Other sequelae of cerebral infarction: Secondary | ICD-10-CM | POA: Diagnosis not present

## 2023-06-08 DIAGNOSIS — H538 Other visual disturbances: Secondary | ICD-10-CM | POA: Diagnosis not present

## 2023-06-08 DIAGNOSIS — I69354 Hemiplegia and hemiparesis following cerebral infarction affecting left non-dominant side: Secondary | ICD-10-CM | POA: Diagnosis not present

## 2023-06-08 DIAGNOSIS — M25512 Pain in left shoulder: Secondary | ICD-10-CM | POA: Diagnosis not present

## 2023-06-08 DIAGNOSIS — M792 Neuralgia and neuritis, unspecified: Secondary | ICD-10-CM | POA: Diagnosis not present

## 2023-06-08 DIAGNOSIS — E1151 Type 2 diabetes mellitus with diabetic peripheral angiopathy without gangrene: Secondary | ICD-10-CM | POA: Diagnosis not present

## 2023-06-08 DIAGNOSIS — G8929 Other chronic pain: Secondary | ICD-10-CM | POA: Diagnosis not present

## 2023-06-08 DIAGNOSIS — I130 Hypertensive heart and chronic kidney disease with heart failure and stage 1 through stage 4 chronic kidney disease, or unspecified chronic kidney disease: Secondary | ICD-10-CM | POA: Diagnosis not present

## 2023-06-08 DIAGNOSIS — E1140 Type 2 diabetes mellitus with diabetic neuropathy, unspecified: Secondary | ICD-10-CM | POA: Diagnosis not present

## 2023-06-09 DIAGNOSIS — I69398 Other sequelae of cerebral infarction: Secondary | ICD-10-CM | POA: Diagnosis not present

## 2023-06-09 DIAGNOSIS — G8929 Other chronic pain: Secondary | ICD-10-CM | POA: Diagnosis not present

## 2023-06-09 DIAGNOSIS — E1140 Type 2 diabetes mellitus with diabetic neuropathy, unspecified: Secondary | ICD-10-CM | POA: Diagnosis not present

## 2023-06-09 DIAGNOSIS — H538 Other visual disturbances: Secondary | ICD-10-CM | POA: Diagnosis not present

## 2023-06-09 DIAGNOSIS — E1151 Type 2 diabetes mellitus with diabetic peripheral angiopathy without gangrene: Secondary | ICD-10-CM | POA: Diagnosis not present

## 2023-06-09 DIAGNOSIS — M792 Neuralgia and neuritis, unspecified: Secondary | ICD-10-CM | POA: Diagnosis not present

## 2023-06-09 DIAGNOSIS — I69354 Hemiplegia and hemiparesis following cerebral infarction affecting left non-dominant side: Secondary | ICD-10-CM | POA: Diagnosis not present

## 2023-06-09 DIAGNOSIS — M25512 Pain in left shoulder: Secondary | ICD-10-CM | POA: Diagnosis not present

## 2023-06-09 DIAGNOSIS — R9389 Abnormal findings on diagnostic imaging of other specified body structures: Secondary | ICD-10-CM | POA: Diagnosis not present

## 2023-06-09 DIAGNOSIS — I130 Hypertensive heart and chronic kidney disease with heart failure and stage 1 through stage 4 chronic kidney disease, or unspecified chronic kidney disease: Secondary | ICD-10-CM | POA: Diagnosis not present

## 2023-06-10 DIAGNOSIS — G8929 Other chronic pain: Secondary | ICD-10-CM | POA: Diagnosis not present

## 2023-06-10 DIAGNOSIS — M25512 Pain in left shoulder: Secondary | ICD-10-CM | POA: Diagnosis not present

## 2023-06-10 DIAGNOSIS — I69354 Hemiplegia and hemiparesis following cerebral infarction affecting left non-dominant side: Secondary | ICD-10-CM | POA: Diagnosis not present

## 2023-06-10 DIAGNOSIS — M792 Neuralgia and neuritis, unspecified: Secondary | ICD-10-CM | POA: Diagnosis not present

## 2023-06-10 DIAGNOSIS — I130 Hypertensive heart and chronic kidney disease with heart failure and stage 1 through stage 4 chronic kidney disease, or unspecified chronic kidney disease: Secondary | ICD-10-CM | POA: Diagnosis not present

## 2023-06-10 DIAGNOSIS — I69398 Other sequelae of cerebral infarction: Secondary | ICD-10-CM | POA: Diagnosis not present

## 2023-06-10 DIAGNOSIS — H538 Other visual disturbances: Secondary | ICD-10-CM | POA: Diagnosis not present

## 2023-06-10 DIAGNOSIS — E1140 Type 2 diabetes mellitus with diabetic neuropathy, unspecified: Secondary | ICD-10-CM | POA: Diagnosis not present

## 2023-06-10 DIAGNOSIS — E1151 Type 2 diabetes mellitus with diabetic peripheral angiopathy without gangrene: Secondary | ICD-10-CM | POA: Diagnosis not present

## 2023-06-14 DIAGNOSIS — H538 Other visual disturbances: Secondary | ICD-10-CM | POA: Diagnosis not present

## 2023-06-14 DIAGNOSIS — M792 Neuralgia and neuritis, unspecified: Secondary | ICD-10-CM | POA: Diagnosis not present

## 2023-06-14 DIAGNOSIS — E1151 Type 2 diabetes mellitus with diabetic peripheral angiopathy without gangrene: Secondary | ICD-10-CM | POA: Diagnosis not present

## 2023-06-14 DIAGNOSIS — I69398 Other sequelae of cerebral infarction: Secondary | ICD-10-CM | POA: Diagnosis not present

## 2023-06-14 DIAGNOSIS — E1140 Type 2 diabetes mellitus with diabetic neuropathy, unspecified: Secondary | ICD-10-CM | POA: Diagnosis not present

## 2023-06-14 DIAGNOSIS — I130 Hypertensive heart and chronic kidney disease with heart failure and stage 1 through stage 4 chronic kidney disease, or unspecified chronic kidney disease: Secondary | ICD-10-CM | POA: Diagnosis not present

## 2023-06-14 DIAGNOSIS — I69354 Hemiplegia and hemiparesis following cerebral infarction affecting left non-dominant side: Secondary | ICD-10-CM | POA: Diagnosis not present

## 2023-06-14 DIAGNOSIS — G8929 Other chronic pain: Secondary | ICD-10-CM | POA: Diagnosis not present

## 2023-06-14 DIAGNOSIS — M25512 Pain in left shoulder: Secondary | ICD-10-CM | POA: Diagnosis not present

## 2023-06-15 DIAGNOSIS — I69354 Hemiplegia and hemiparesis following cerebral infarction affecting left non-dominant side: Secondary | ICD-10-CM | POA: Diagnosis not present

## 2023-06-15 DIAGNOSIS — M25512 Pain in left shoulder: Secondary | ICD-10-CM | POA: Diagnosis not present

## 2023-06-15 DIAGNOSIS — E1140 Type 2 diabetes mellitus with diabetic neuropathy, unspecified: Secondary | ICD-10-CM | POA: Diagnosis not present

## 2023-06-15 DIAGNOSIS — I130 Hypertensive heart and chronic kidney disease with heart failure and stage 1 through stage 4 chronic kidney disease, or unspecified chronic kidney disease: Secondary | ICD-10-CM | POA: Diagnosis not present

## 2023-06-15 DIAGNOSIS — I69398 Other sequelae of cerebral infarction: Secondary | ICD-10-CM | POA: Diagnosis not present

## 2023-06-15 DIAGNOSIS — H538 Other visual disturbances: Secondary | ICD-10-CM | POA: Diagnosis not present

## 2023-06-15 DIAGNOSIS — M792 Neuralgia and neuritis, unspecified: Secondary | ICD-10-CM | POA: Diagnosis not present

## 2023-06-15 DIAGNOSIS — G8929 Other chronic pain: Secondary | ICD-10-CM | POA: Diagnosis not present

## 2023-06-15 DIAGNOSIS — E1151 Type 2 diabetes mellitus with diabetic peripheral angiopathy without gangrene: Secondary | ICD-10-CM | POA: Diagnosis not present

## 2023-06-16 DIAGNOSIS — E1140 Type 2 diabetes mellitus with diabetic neuropathy, unspecified: Secondary | ICD-10-CM | POA: Diagnosis not present

## 2023-06-16 DIAGNOSIS — H538 Other visual disturbances: Secondary | ICD-10-CM | POA: Diagnosis not present

## 2023-06-16 DIAGNOSIS — E1151 Type 2 diabetes mellitus with diabetic peripheral angiopathy without gangrene: Secondary | ICD-10-CM | POA: Diagnosis not present

## 2023-06-16 DIAGNOSIS — I130 Hypertensive heart and chronic kidney disease with heart failure and stage 1 through stage 4 chronic kidney disease, or unspecified chronic kidney disease: Secondary | ICD-10-CM | POA: Diagnosis not present

## 2023-06-16 DIAGNOSIS — I69354 Hemiplegia and hemiparesis following cerebral infarction affecting left non-dominant side: Secondary | ICD-10-CM | POA: Diagnosis not present

## 2023-06-16 DIAGNOSIS — I69398 Other sequelae of cerebral infarction: Secondary | ICD-10-CM | POA: Diagnosis not present

## 2023-06-16 DIAGNOSIS — M25512 Pain in left shoulder: Secondary | ICD-10-CM | POA: Diagnosis not present

## 2023-06-16 DIAGNOSIS — G8929 Other chronic pain: Secondary | ICD-10-CM | POA: Diagnosis not present

## 2023-06-16 DIAGNOSIS — M792 Neuralgia and neuritis, unspecified: Secondary | ICD-10-CM | POA: Diagnosis not present

## 2023-06-17 DIAGNOSIS — I69354 Hemiplegia and hemiparesis following cerebral infarction affecting left non-dominant side: Secondary | ICD-10-CM | POA: Diagnosis not present

## 2023-06-17 DIAGNOSIS — I69398 Other sequelae of cerebral infarction: Secondary | ICD-10-CM | POA: Diagnosis not present

## 2023-06-17 DIAGNOSIS — E1151 Type 2 diabetes mellitus with diabetic peripheral angiopathy without gangrene: Secondary | ICD-10-CM | POA: Diagnosis not present

## 2023-06-17 DIAGNOSIS — G8929 Other chronic pain: Secondary | ICD-10-CM | POA: Diagnosis not present

## 2023-06-17 DIAGNOSIS — M792 Neuralgia and neuritis, unspecified: Secondary | ICD-10-CM | POA: Diagnosis not present

## 2023-06-17 DIAGNOSIS — E1140 Type 2 diabetes mellitus with diabetic neuropathy, unspecified: Secondary | ICD-10-CM | POA: Diagnosis not present

## 2023-06-17 DIAGNOSIS — I130 Hypertensive heart and chronic kidney disease with heart failure and stage 1 through stage 4 chronic kidney disease, or unspecified chronic kidney disease: Secondary | ICD-10-CM | POA: Diagnosis not present

## 2023-06-17 DIAGNOSIS — M25512 Pain in left shoulder: Secondary | ICD-10-CM | POA: Diagnosis not present

## 2023-06-17 DIAGNOSIS — H538 Other visual disturbances: Secondary | ICD-10-CM | POA: Diagnosis not present

## 2023-06-21 DIAGNOSIS — E538 Deficiency of other specified B group vitamins: Secondary | ICD-10-CM | POA: Diagnosis not present

## 2023-06-21 DIAGNOSIS — R9389 Abnormal findings on diagnostic imaging of other specified body structures: Secondary | ICD-10-CM | POA: Diagnosis not present

## 2023-06-21 DIAGNOSIS — E041 Nontoxic single thyroid nodule: Secondary | ICD-10-CM | POA: Diagnosis not present

## 2023-06-21 DIAGNOSIS — E1169 Type 2 diabetes mellitus with other specified complication: Secondary | ICD-10-CM | POA: Diagnosis not present

## 2023-06-21 DIAGNOSIS — N1832 Chronic kidney disease, stage 3b: Secondary | ICD-10-CM | POA: Diagnosis not present

## 2023-06-21 DIAGNOSIS — I63532 Cerebral infarction due to unspecified occlusion or stenosis of left posterior cerebral artery: Secondary | ICD-10-CM | POA: Diagnosis not present

## 2023-06-22 DIAGNOSIS — M792 Neuralgia and neuritis, unspecified: Secondary | ICD-10-CM | POA: Diagnosis not present

## 2023-06-22 DIAGNOSIS — G8929 Other chronic pain: Secondary | ICD-10-CM | POA: Diagnosis not present

## 2023-06-22 DIAGNOSIS — I69354 Hemiplegia and hemiparesis following cerebral infarction affecting left non-dominant side: Secondary | ICD-10-CM | POA: Diagnosis not present

## 2023-06-22 DIAGNOSIS — I69398 Other sequelae of cerebral infarction: Secondary | ICD-10-CM | POA: Diagnosis not present

## 2023-06-22 DIAGNOSIS — E1151 Type 2 diabetes mellitus with diabetic peripheral angiopathy without gangrene: Secondary | ICD-10-CM | POA: Diagnosis not present

## 2023-06-22 DIAGNOSIS — M25512 Pain in left shoulder: Secondary | ICD-10-CM | POA: Diagnosis not present

## 2023-06-22 DIAGNOSIS — I130 Hypertensive heart and chronic kidney disease with heart failure and stage 1 through stage 4 chronic kidney disease, or unspecified chronic kidney disease: Secondary | ICD-10-CM | POA: Diagnosis not present

## 2023-06-22 DIAGNOSIS — H538 Other visual disturbances: Secondary | ICD-10-CM | POA: Diagnosis not present

## 2023-06-22 DIAGNOSIS — E1140 Type 2 diabetes mellitus with diabetic neuropathy, unspecified: Secondary | ICD-10-CM | POA: Diagnosis not present

## 2023-06-24 DIAGNOSIS — I69354 Hemiplegia and hemiparesis following cerebral infarction affecting left non-dominant side: Secondary | ICD-10-CM | POA: Diagnosis not present

## 2023-06-24 DIAGNOSIS — E1140 Type 2 diabetes mellitus with diabetic neuropathy, unspecified: Secondary | ICD-10-CM | POA: Diagnosis not present

## 2023-06-24 DIAGNOSIS — E1151 Type 2 diabetes mellitus with diabetic peripheral angiopathy without gangrene: Secondary | ICD-10-CM | POA: Diagnosis not present

## 2023-06-24 DIAGNOSIS — H538 Other visual disturbances: Secondary | ICD-10-CM | POA: Diagnosis not present

## 2023-06-24 DIAGNOSIS — I130 Hypertensive heart and chronic kidney disease with heart failure and stage 1 through stage 4 chronic kidney disease, or unspecified chronic kidney disease: Secondary | ICD-10-CM | POA: Diagnosis not present

## 2023-06-24 DIAGNOSIS — G8929 Other chronic pain: Secondary | ICD-10-CM | POA: Diagnosis not present

## 2023-06-24 DIAGNOSIS — M25512 Pain in left shoulder: Secondary | ICD-10-CM | POA: Diagnosis not present

## 2023-06-24 DIAGNOSIS — M792 Neuralgia and neuritis, unspecified: Secondary | ICD-10-CM | POA: Diagnosis not present

## 2023-06-24 DIAGNOSIS — I69398 Other sequelae of cerebral infarction: Secondary | ICD-10-CM | POA: Diagnosis not present

## 2023-06-28 ENCOUNTER — Other Ambulatory Visit: Payer: Self-pay

## 2023-06-28 ENCOUNTER — Emergency Department (HOSPITAL_BASED_OUTPATIENT_CLINIC_OR_DEPARTMENT_OTHER): Admission: EM | Admit: 2023-06-28 | Discharge: 2023-06-28 | Disposition: A | Discharge status: Home / Self Care

## 2023-06-28 ENCOUNTER — Encounter (HOSPITAL_BASED_OUTPATIENT_CLINIC_OR_DEPARTMENT_OTHER): Payer: Self-pay | Admitting: Emergency Medicine

## 2023-06-28 ENCOUNTER — Other Ambulatory Visit (HOSPITAL_BASED_OUTPATIENT_CLINIC_OR_DEPARTMENT_OTHER): Payer: Self-pay

## 2023-06-28 ENCOUNTER — Emergency Department (HOSPITAL_BASED_OUTPATIENT_CLINIC_OR_DEPARTMENT_OTHER)

## 2023-06-28 DIAGNOSIS — S0990XA Unspecified injury of head, initial encounter: Secondary | ICD-10-CM | POA: Diagnosis not present

## 2023-06-28 DIAGNOSIS — M8580 Other specified disorders of bone density and structure, unspecified site: Secondary | ICD-10-CM | POA: Diagnosis not present

## 2023-06-28 DIAGNOSIS — Z79899 Other long term (current) drug therapy: Secondary | ICD-10-CM | POA: Insufficient documentation

## 2023-06-28 DIAGNOSIS — R519 Headache, unspecified: Secondary | ICD-10-CM | POA: Diagnosis not present

## 2023-06-28 DIAGNOSIS — N189 Chronic kidney disease, unspecified: Secondary | ICD-10-CM | POA: Diagnosis not present

## 2023-06-28 DIAGNOSIS — R600 Localized edema: Secondary | ICD-10-CM | POA: Insufficient documentation

## 2023-06-28 DIAGNOSIS — G9389 Other specified disorders of brain: Secondary | ICD-10-CM | POA: Diagnosis not present

## 2023-06-28 DIAGNOSIS — Y92009 Unspecified place in unspecified non-institutional (private) residence as the place of occurrence of the external cause: Secondary | ICD-10-CM | POA: Diagnosis not present

## 2023-06-28 DIAGNOSIS — I6789 Other cerebrovascular disease: Secondary | ICD-10-CM | POA: Diagnosis not present

## 2023-06-28 DIAGNOSIS — W19XXXA Unspecified fall, initial encounter: Secondary | ICD-10-CM

## 2023-06-28 DIAGNOSIS — I129 Hypertensive chronic kidney disease with stage 1 through stage 4 chronic kidney disease, or unspecified chronic kidney disease: Secondary | ICD-10-CM | POA: Insufficient documentation

## 2023-06-28 DIAGNOSIS — Z7982 Long term (current) use of aspirin: Secondary | ICD-10-CM | POA: Diagnosis not present

## 2023-06-28 DIAGNOSIS — Z7901 Long term (current) use of anticoagulants: Secondary | ICD-10-CM | POA: Diagnosis not present

## 2023-06-28 DIAGNOSIS — M25432 Effusion, left wrist: Secondary | ICD-10-CM | POA: Diagnosis not present

## 2023-06-28 DIAGNOSIS — E1122 Type 2 diabetes mellitus with diabetic chronic kidney disease: Secondary | ICD-10-CM | POA: Insufficient documentation

## 2023-06-28 DIAGNOSIS — Z8673 Personal history of transient ischemic attack (TIA), and cerebral infarction without residual deficits: Secondary | ICD-10-CM | POA: Diagnosis not present

## 2023-06-28 DIAGNOSIS — S0003XA Contusion of scalp, initial encounter: Secondary | ICD-10-CM | POA: Diagnosis not present

## 2023-06-28 DIAGNOSIS — M25532 Pain in left wrist: Secondary | ICD-10-CM | POA: Diagnosis not present

## 2023-06-28 DIAGNOSIS — M19032 Primary osteoarthritis, left wrist: Secondary | ICD-10-CM | POA: Diagnosis not present

## 2023-06-28 NOTE — ED Provider Notes (Signed)
 [place] EMERGENCY DEPARTMENT AT [facility] Provider Note   CSN: [id number] Arrival date & time: 06/28/23  0944     History  Chief Complaint  Patient presents with   Kathleen Figueroa    Kathleen Figueroa is a 88 y.o. female with history of hypertension, type 2 diabetes, CKD, CVA with left sided hemiparesis, presents with concern for a fall that occurred last evening at about 6 PM.  Patient states she was in a chair at home and started slipping of the chair.  She fell onto the ground, landing on her bottom and left wrist.  She denies hitting her head or any loss of consciousness.  Her caregiver was present and was able to help her off of the floor immediately after this happened.  She is currently taking Plavix , last dose was this morning.  Currently reporting pain and swelling in the left wrist. No numbness or tingling in the upper or lower extremities. Denies pain elsewhere. Family at bedside state patient is acting at baseline.   HPI     Home Medications Prior to Admission medications   Medication Sig Start Date End Date Taking? Authorizing Provider  acetaminophen  (TYLENOL ) 325 MG tablet Take 650 mg by mouth every 6 (six) hours as needed.    [provider]  amLODipine  (NORVASC ) 10 MG tablet Take 1 tablet (10 mg total) by mouth daily. 11/13/22   Ghimire, Estil Heman, MD  aspirin  EC 81 MG tablet Take 1 tablet (81 mg total) by mouth daily. Swallow whole. 02/19/23   Hongalgi, Anand D, MD  atorvastatin  (LIPITOR ) 80 MG tablet Take 1 tablet (80 mg total) by mouth daily. 02/19/23   Hongalgi, Anand D, MD  clopidogrel  (PLAVIX ) 75 MG tablet Take 75 mg by mouth daily.    [provider]  feeding supplement (BOOST HIGH PROTEIN) LIQD Take 1 Container by mouth 3 (three) times daily between meals.    [provider]  gabapentin  (NEURONTIN ) 100 MG capsule Take 2 capsules (200 mg total) by mouth 3 (three) times daily. 03/15/23   Setzer, Sandra J, PA-C  lidocaine  (HM LIDOCAINE  PATCH)  4 % Place 1 patch onto the skin daily.    [provider]  polyethylene glycol (MIRALAX  / GLYCOLAX ) 17 g packet Take 17 g by mouth 2 (two) times daily. 03/15/23   Setzer, Sandra J, PA-C      Allergies    Aspirin     Review of Systems   Review of Systems  Musculoskeletal:        Left wrist pain    Physical Exam Updated Vital Signs BP (!) 139/93 (BP Location: Right Arm)   Pulse 89   Temp 98.2 F (36.8 C) (Oral)   Resp 18   SpO2 99%  Physical Exam Vitals and nursing note reviewed.  Constitutional:      General: She is not in acute distress.    Appearance: She is well-developed.  HENT:     Head: Normocephalic and atraumatic.  Eyes:     Conjunctiva/sclera: Conjunctivae normal.  Cardiovascular:     Rate and Rhythm: Normal rate and regular rhythm.     Heart sounds: No murmur heard.    Comments: 2+ radial pulse bilaterally 2+ dorsalis pedis pulse bilaterally Pulmonary:     Effort: Pulmonary effort is normal. No respiratory distress.     Breath sounds: Normal breath sounds.  Abdominal:     Palpations: Abdomen is soft.     Tenderness: There is no abdominal tenderness.  Musculoskeletal:  General: No swelling.     Cervical back: Neck supple.     Comments: General Mild edema over the left wrist diffusely. No obvious deformity. No erythema, contusions, open wounds   Palpation Non-tender to palpation of the clavicles,humerus, radius and ulna, carpal bones, 1st-5th metacarpals and phalanges bilaterally Non tender over the femur, patella, tibia or fibula bilaterally  Non-tender over the cervical, thoracic, or lumbar spinous processes. Non-tender to palpation of the paraspinal region of the back or chest wall diffusely  No tenderness of the pelvis diffusely  ROM Full ROM of shoulders bilaterally Full elbow, wrist, and knee flexion and extension bilaterally Intact plantarflexion and dorsiflexion, hip flexion bilaterally  Sensation: Sensation intact throughout the  bilateral upper and lower extremity  Strength: 5/5 strength with resisted elbow flexion and extension bilaterally 5/5 strength with resisted ankle plantarflexion and dorsiflexion bilaterally    Skin:    General: Skin is warm and dry.     Capillary Refill: Capillary refill takes less than 2 seconds.  Neurological:     General: No focal deficit present.     Mental Status: She is alert and oriented to person, place, and time. Mental status is at baseline.     Comments: Baseline left sided hemiparesis   Psychiatric:        Mood and Affect: Mood normal.     ED Results / Procedures / Treatments   Labs (all labs ordered are listed, but only abnormal results are displayed) Labs Reviewed - No data to display  EKG None  Radiology DG Wrist Complete Left Result Date: 06/28/2023 CLINICAL DATA:  Fall and left wrist pain. EXAM: LEFT WRIST - COMPLETE 3+ VIEW COMPARISON:  None Available. FINDINGS: There is no acute fracture or dislocation. The bones are osteopenic. There is arthritic changes of the base of the thumb. Soft tissue swelling of the wrist. No radiopaque foreign object or soft tissue gas. IMPRESSION: 1. No acute fracture or dislocation. 2. Soft tissue swelling. Electronically Signed   By: Angus Bark M.D.   On: 06/28/2023 11:41   CT Head Wo Contrast Result Date: 06/28/2023 CLINICAL DATA:  88 year old female fell from wheelchair yesterday. Pain. Status post large right PCA territory infarct in January. EXAM: CT HEAD WITHOUT CONTRAST TECHNIQUE: Contiguous axial images were obtained from the base of the skull through the vertex without intravenous contrast. RADIATION DOSE REDUCTION: This exam was performed according to the departmental dose-optimization program which includes automated exposure control, adjustment of the mA and/or kV according to patient size and/or use of iterative reconstruction technique. COMPARISON:  Brain MRI 02/11/2023. Head CT 04/03/2023. FINDINGS: Brain: Large  right PCA territory area of encephalomalacia with expected evolution since February. Chronic contralateral left posterior MCA territory infarct with encephalomalacia. Patchy and confluent additional bilateral cerebral white matter hypodensity. Chronic lacunar infarcts elsewhere in the bilateral deep gray nuclei, the pons. No midline shift, ventriculomegaly, mass effect, evidence of mass lesion, intracranial hemorrhage or evidence of cortically based acute infarction. Vascular: No suspicious intracranial vascular hyperdensity. Calcified atherosclerosis at the skull base. Skull: Stable and intact. Sinuses/Orbits: Visualized paranasal sinuses and mastoids are stable and well aerated. Other: No acute orbit or scalp soft tissue injury identified. Regressed left forehead scalp hematoma since February. IMPRESSION: 1. No acute intracranial abnormality or acute traumatic injury identified. 2. Expected evolution of large Right PCA infarct since February. Chronic Left posterior MCA territory infarct. Advanced underlying chronic small vessel disease. Electronically Signed   By: Marlise Simpers M.D.   On: 06/28/2023 10:44  Procedures Procedures    Medications Ordered in ED Medications - No data to display  ED Course/ Medical Decision Making/ A&P                                 Medical Decision Making Amount and/or Complexity of Data Reviewed Radiology: ordered.    Differential diagnosis includes but is not limited to fracture, dislocation, sprain, bone contusion, intracranial hemorrhage   ED Course:  Upon initial evaluation, patient is well appearing, stable vitals. Alert and oriented to place, time, self, and situation. She is able to explain everything that occurred during the fall, denies hitting head or any LOC. Was only on the floor for maybe a minute after the fall, low concern for rhabdomyolysis. Has baseline hemiparesis from previous CVA. Left wrist appears edematous but patient does not have any  tenderness over the left radius, ulna, carpal bones, or hand diffusely. Full ROM of the left elbow, wrist, and 1st-5th phalanges. Low concern for tendon injury. Neurovascularly intact in the bilateral upper and lower extremities. Will proceed with x ray of patient's left wrist and CT head since she is on Plavix .    Imaging Studies ordered: I ordered imaging studies including CT head, x-ray left wrist I independently visualized the imaging with scope of interpretation limited to determining acute life threatening conditions related to emergency care.  CT head without acute abnormality X-ray of left wrist with soft tissue swelling noted but no fracture or dislocation I agree with the radiologist interpretation    Medications Given: Declines  Upon re-evaluation, patient still well-appearing, denying any further pain.  Remains at baseline mental status.  CT head unremarkable.  X-ray left wrist does show the soft tissue swelling, no fracture or dislocation.  Suspect this swelling may be due to the impact of the injury. Stable and appropriate for discharge home.    Impression: Mechanical fall  Disposition:  The patient was discharged home with instructions to take tylenol  as needed for pain. Follow up with PCP within the next week if left wrist swelling/pain does not start to improve Return precautions given.    Record Review: External records from outside source obtained and reviewed including ER visits for previous CVA, left-sided hemiparesis noted at that time     This chart was dictated using voice recognition software, Dragon. Despite the best efforts of this provider to proofread and correct errors, errors may still occur which can change documentation meaning.          Final Clinical Impression(s) / ED Diagnoses Final diagnoses:  Accident due to mechanical fall without injury, initial encounter    Rx / DC Orders ED Discharge Orders     None         Rexie Catena, PA-C 06/28/23 1151    Carin Charleston, MD 06/28/23 [phone]

## 2023-06-28 NOTE — Discharge Instructions (Signed)
 Your head CT did not show any new abnormalities.  Your x-ray of the wrist did not show a fracture or dislocation.  Avoid movements and activities that are painful in the first couple of days after the injury. Elevate the area of injury and wrap with an elastic bandage or tape to help with swelling.  You may take up to 650mg  of tylenol  every 6 hours as needed for pain.   Gradually return to activity as pain allows. Try to engage in non-painful types of physical activity/exercise to increase blood flow to your area of injury.  Please follow-up with your PCP within the next week if your left wrist pain and swelling are not improving.  Return to the ER for any increased pain, numbness or tingling in your hand, severe headache, any other new or concerning symptoms

## 2023-06-28 NOTE — ED Triage Notes (Signed)
 Family states patient "slipped out of wheelchair yesterday" Denies hitting head. C/o left wrist pain. Swelling noted to area. Takes Plavix .   Hx of stroke. Preexisting weakness on left side.

## 2023-06-29 DIAGNOSIS — I69398 Other sequelae of cerebral infarction: Secondary | ICD-10-CM | POA: Diagnosis not present

## 2023-06-29 DIAGNOSIS — I130 Hypertensive heart and chronic kidney disease with heart failure and stage 1 through stage 4 chronic kidney disease, or unspecified chronic kidney disease: Secondary | ICD-10-CM | POA: Diagnosis not present

## 2023-06-29 DIAGNOSIS — I69354 Hemiplegia and hemiparesis following cerebral infarction affecting left non-dominant side: Secondary | ICD-10-CM | POA: Diagnosis not present

## 2023-06-29 DIAGNOSIS — M25512 Pain in left shoulder: Secondary | ICD-10-CM | POA: Diagnosis not present

## 2023-06-29 DIAGNOSIS — H538 Other visual disturbances: Secondary | ICD-10-CM | POA: Diagnosis not present

## 2023-06-29 DIAGNOSIS — E1151 Type 2 diabetes mellitus with diabetic peripheral angiopathy without gangrene: Secondary | ICD-10-CM | POA: Diagnosis not present

## 2023-06-29 DIAGNOSIS — M792 Neuralgia and neuritis, unspecified: Secondary | ICD-10-CM | POA: Diagnosis not present

## 2023-06-29 DIAGNOSIS — E1140 Type 2 diabetes mellitus with diabetic neuropathy, unspecified: Secondary | ICD-10-CM | POA: Diagnosis not present

## 2023-06-29 DIAGNOSIS — G8929 Other chronic pain: Secondary | ICD-10-CM | POA: Diagnosis not present

## 2023-07-06 DIAGNOSIS — M792 Neuralgia and neuritis, unspecified: Secondary | ICD-10-CM | POA: Diagnosis not present

## 2023-07-06 DIAGNOSIS — I69398 Other sequelae of cerebral infarction: Secondary | ICD-10-CM | POA: Diagnosis not present

## 2023-07-06 DIAGNOSIS — E1151 Type 2 diabetes mellitus with diabetic peripheral angiopathy without gangrene: Secondary | ICD-10-CM | POA: Diagnosis not present

## 2023-07-06 DIAGNOSIS — M25512 Pain in left shoulder: Secondary | ICD-10-CM | POA: Diagnosis not present

## 2023-07-06 DIAGNOSIS — I69354 Hemiplegia and hemiparesis following cerebral infarction affecting left non-dominant side: Secondary | ICD-10-CM | POA: Diagnosis not present

## 2023-07-06 DIAGNOSIS — I130 Hypertensive heart and chronic kidney disease with heart failure and stage 1 through stage 4 chronic kidney disease, or unspecified chronic kidney disease: Secondary | ICD-10-CM | POA: Diagnosis not present

## 2023-07-06 DIAGNOSIS — G8929 Other chronic pain: Secondary | ICD-10-CM | POA: Diagnosis not present

## 2023-07-06 DIAGNOSIS — H538 Other visual disturbances: Secondary | ICD-10-CM | POA: Diagnosis not present

## 2023-07-06 DIAGNOSIS — E1140 Type 2 diabetes mellitus with diabetic neuropathy, unspecified: Secondary | ICD-10-CM | POA: Diagnosis not present

## 2023-07-07 DIAGNOSIS — M25532 Pain in left wrist: Secondary | ICD-10-CM | POA: Diagnosis not present

## 2023-07-07 DIAGNOSIS — I63532 Cerebral infarction due to unspecified occlusion or stenosis of left posterior cerebral artery: Secondary | ICD-10-CM | POA: Diagnosis not present

## 2023-07-07 DIAGNOSIS — E538 Deficiency of other specified B group vitamins: Secondary | ICD-10-CM | POA: Diagnosis not present

## 2023-07-07 DIAGNOSIS — N1832 Chronic kidney disease, stage 3b: Secondary | ICD-10-CM | POA: Diagnosis not present

## 2023-07-07 DIAGNOSIS — E1169 Type 2 diabetes mellitus with other specified complication: Secondary | ICD-10-CM | POA: Diagnosis not present

## 2023-07-07 DIAGNOSIS — E7800 Pure hypercholesterolemia, unspecified: Secondary | ICD-10-CM | POA: Diagnosis not present

## 2023-07-07 DIAGNOSIS — I700 Atherosclerosis of aorta: Secondary | ICD-10-CM | POA: Diagnosis not present

## 2023-07-07 DIAGNOSIS — K5909 Other constipation: Secondary | ICD-10-CM | POA: Diagnosis not present

## 2023-07-07 DIAGNOSIS — E041 Nontoxic single thyroid nodule: Secondary | ICD-10-CM | POA: Diagnosis not present

## 2023-07-10 DIAGNOSIS — E1169 Type 2 diabetes mellitus with other specified complication: Secondary | ICD-10-CM | POA: Diagnosis not present

## 2023-07-10 DIAGNOSIS — E119 Type 2 diabetes mellitus without complications: Secondary | ICD-10-CM | POA: Diagnosis not present

## 2023-07-10 DIAGNOSIS — E7800 Pure hypercholesterolemia, unspecified: Secondary | ICD-10-CM | POA: Diagnosis not present

## 2023-07-10 DIAGNOSIS — I10 Essential (primary) hypertension: Secondary | ICD-10-CM | POA: Diagnosis not present

## 2023-07-13 DIAGNOSIS — Z8673 Personal history of transient ischemic attack (TIA), and cerebral infarction without residual deficits: Secondary | ICD-10-CM | POA: Diagnosis not present

## 2023-07-13 DIAGNOSIS — I10 Essential (primary) hypertension: Secondary | ICD-10-CM | POA: Diagnosis not present

## 2023-07-13 DIAGNOSIS — Z753 Unavailability and inaccessibility of health-care facilities: Secondary | ICD-10-CM | POA: Diagnosis not present

## 2023-07-20 ENCOUNTER — Telehealth: Payer: Self-pay | Admitting: Neurology

## 2023-07-20 MED ORDER — CLOPIDOGREL BISULFATE 75 MG PO TABS: 75.0000 mg | ORAL_TABLET | Freq: Every day | ORAL | 3 refills | Status: AC

## 2023-07-20 NOTE — Telephone Encounter (Signed)
 Hania from [facility] called stating that the pt is needing a refill on her clopidogrel  (PLAVIX ) 75 MG tablet Please advise.

## 2023-07-20 NOTE — Telephone Encounter (Signed)
 refilled

## 2023-08-03 DIAGNOSIS — E119 Type 2 diabetes mellitus without complications: Secondary | ICD-10-CM | POA: Diagnosis not present

## 2023-08-09 DIAGNOSIS — E7800 Pure hypercholesterolemia, unspecified: Secondary | ICD-10-CM | POA: Diagnosis not present

## 2023-08-09 DIAGNOSIS — I10 Essential (primary) hypertension: Secondary | ICD-10-CM | POA: Diagnosis not present

## 2023-08-09 DIAGNOSIS — E119 Type 2 diabetes mellitus without complications: Secondary | ICD-10-CM | POA: Diagnosis not present

## 2023-08-09 DIAGNOSIS — E1169 Type 2 diabetes mellitus with other specified complication: Secondary | ICD-10-CM | POA: Diagnosis not present

## 2023-09-09 DIAGNOSIS — I10 Essential (primary) hypertension: Secondary | ICD-10-CM | POA: Diagnosis not present

## 2023-09-09 DIAGNOSIS — E7800 Pure hypercholesterolemia, unspecified: Secondary | ICD-10-CM | POA: Diagnosis not present

## 2023-09-09 DIAGNOSIS — E1169 Type 2 diabetes mellitus with other specified complication: Secondary | ICD-10-CM | POA: Diagnosis not present

## 2023-09-09 DIAGNOSIS — E119 Type 2 diabetes mellitus without complications: Secondary | ICD-10-CM | POA: Diagnosis not present

## 2023-09-29 DIAGNOSIS — Z111 Encounter for screening for respiratory tuberculosis: Secondary | ICD-10-CM | POA: Diagnosis not present

## 2023-09-29 DIAGNOSIS — Z8673 Personal history of transient ischemic attack (TIA), and cerebral infarction without residual deficits: Secondary | ICD-10-CM | POA: Diagnosis not present

## 2023-09-29 DIAGNOSIS — R32 Unspecified urinary incontinence: Secondary | ICD-10-CM | POA: Diagnosis not present

## 2023-09-29 DIAGNOSIS — L299 Pruritus, unspecified: Secondary | ICD-10-CM | POA: Diagnosis not present

## 2023-09-29 DIAGNOSIS — I10 Essential (primary) hypertension: Secondary | ICD-10-CM | POA: Diagnosis not present

## 2023-10-10 DIAGNOSIS — I10 Essential (primary) hypertension: Secondary | ICD-10-CM | POA: Diagnosis not present

## 2023-10-10 DIAGNOSIS — E1169 Type 2 diabetes mellitus with other specified complication: Secondary | ICD-10-CM | POA: Diagnosis not present

## 2023-10-10 DIAGNOSIS — E119 Type 2 diabetes mellitus without complications: Secondary | ICD-10-CM | POA: Diagnosis not present

## 2023-10-10 DIAGNOSIS — E7800 Pure hypercholesterolemia, unspecified: Secondary | ICD-10-CM | POA: Diagnosis not present

## 2023-10-12 DIAGNOSIS — R2689 Other abnormalities of gait and mobility: Secondary | ICD-10-CM | POA: Diagnosis not present

## 2023-10-12 DIAGNOSIS — R2681 Unsteadiness on feet: Secondary | ICD-10-CM | POA: Diagnosis not present

## 2023-10-12 DIAGNOSIS — R278 Other lack of coordination: Secondary | ICD-10-CM | POA: Diagnosis not present

## 2023-10-12 DIAGNOSIS — M6259 Muscle wasting and atrophy, not elsewhere classified, multiple sites: Secondary | ICD-10-CM | POA: Diagnosis not present

## 2023-10-13 DIAGNOSIS — R2681 Unsteadiness on feet: Secondary | ICD-10-CM | POA: Diagnosis not present

## 2023-10-13 DIAGNOSIS — M62542 Muscle wasting and atrophy, not elsewhere classified, left hand: Secondary | ICD-10-CM | POA: Diagnosis not present

## 2023-10-13 DIAGNOSIS — M62512 Muscle wasting and atrophy, not elsewhere classified, left shoulder: Secondary | ICD-10-CM | POA: Diagnosis not present

## 2023-10-14 ENCOUNTER — Encounter: Payer: Self-pay | Admitting: Physical Medicine & Rehabilitation

## 2023-10-14 DIAGNOSIS — R278 Other lack of coordination: Secondary | ICD-10-CM | POA: Diagnosis not present

## 2023-10-14 DIAGNOSIS — R2681 Unsteadiness on feet: Secondary | ICD-10-CM | POA: Diagnosis not present

## 2023-10-14 DIAGNOSIS — R2689 Other abnormalities of gait and mobility: Secondary | ICD-10-CM | POA: Diagnosis not present

## 2023-10-14 DIAGNOSIS — M6259 Muscle wasting and atrophy, not elsewhere classified, multiple sites: Secondary | ICD-10-CM | POA: Diagnosis not present

## 2023-10-15 ENCOUNTER — Encounter: Admitting: Physical Medicine & Rehabilitation

## 2023-10-15 DIAGNOSIS — M62542 Muscle wasting and atrophy, not elsewhere classified, left hand: Secondary | ICD-10-CM | POA: Diagnosis not present

## 2023-10-15 DIAGNOSIS — M62512 Muscle wasting and atrophy, not elsewhere classified, left shoulder: Secondary | ICD-10-CM | POA: Diagnosis not present

## 2023-10-15 DIAGNOSIS — R2681 Unsteadiness on feet: Secondary | ICD-10-CM | POA: Diagnosis not present

## 2023-10-18 DIAGNOSIS — M62512 Muscle wasting and atrophy, not elsewhere classified, left shoulder: Secondary | ICD-10-CM | POA: Diagnosis not present

## 2023-10-18 DIAGNOSIS — M62542 Muscle wasting and atrophy, not elsewhere classified, left hand: Secondary | ICD-10-CM | POA: Diagnosis not present

## 2023-10-18 DIAGNOSIS — R2681 Unsteadiness on feet: Secondary | ICD-10-CM | POA: Diagnosis not present

## 2023-10-19 DIAGNOSIS — M6259 Muscle wasting and atrophy, not elsewhere classified, multiple sites: Secondary | ICD-10-CM | POA: Diagnosis not present

## 2023-10-19 DIAGNOSIS — R2681 Unsteadiness on feet: Secondary | ICD-10-CM | POA: Diagnosis not present

## 2023-10-19 DIAGNOSIS — R2689 Other abnormalities of gait and mobility: Secondary | ICD-10-CM | POA: Diagnosis not present

## 2023-10-19 DIAGNOSIS — R278 Other lack of coordination: Secondary | ICD-10-CM | POA: Diagnosis not present

## 2023-10-21 DIAGNOSIS — M6259 Muscle wasting and atrophy, not elsewhere classified, multiple sites: Secondary | ICD-10-CM | POA: Diagnosis not present

## 2023-10-21 DIAGNOSIS — R278 Other lack of coordination: Secondary | ICD-10-CM | POA: Diagnosis not present

## 2023-10-21 DIAGNOSIS — R2681 Unsteadiness on feet: Secondary | ICD-10-CM | POA: Diagnosis not present

## 2023-10-21 DIAGNOSIS — R2689 Other abnormalities of gait and mobility: Secondary | ICD-10-CM | POA: Diagnosis not present

## 2023-10-22 DIAGNOSIS — M62542 Muscle wasting and atrophy, not elsewhere classified, left hand: Secondary | ICD-10-CM | POA: Diagnosis not present

## 2023-10-22 DIAGNOSIS — M62512 Muscle wasting and atrophy, not elsewhere classified, left shoulder: Secondary | ICD-10-CM | POA: Diagnosis not present

## 2023-10-22 DIAGNOSIS — R2681 Unsteadiness on feet: Secondary | ICD-10-CM | POA: Diagnosis not present

## 2023-10-25 DIAGNOSIS — R32 Unspecified urinary incontinence: Secondary | ICD-10-CM | POA: Diagnosis not present

## 2023-10-25 DIAGNOSIS — E7800 Pure hypercholesterolemia, unspecified: Secondary | ICD-10-CM | POA: Diagnosis not present

## 2023-10-25 DIAGNOSIS — N1832 Chronic kidney disease, stage 3b: Secondary | ICD-10-CM | POA: Diagnosis not present

## 2023-10-25 DIAGNOSIS — I129 Hypertensive chronic kidney disease with stage 1 through stage 4 chronic kidney disease, or unspecified chronic kidney disease: Secondary | ICD-10-CM | POA: Diagnosis not present

## 2023-10-25 DIAGNOSIS — Z8673 Personal history of transient ischemic attack (TIA), and cerebral infarction without residual deficits: Secondary | ICD-10-CM | POA: Diagnosis not present

## 2023-10-25 DIAGNOSIS — M62542 Muscle wasting and atrophy, not elsewhere classified, left hand: Secondary | ICD-10-CM | POA: Diagnosis not present

## 2023-10-25 DIAGNOSIS — Z0000 Encounter for general adult medical examination without abnormal findings: Secondary | ICD-10-CM | POA: Diagnosis not present

## 2023-10-25 DIAGNOSIS — M62512 Muscle wasting and atrophy, not elsewhere classified, left shoulder: Secondary | ICD-10-CM | POA: Diagnosis not present

## 2023-10-25 DIAGNOSIS — E1159 Type 2 diabetes mellitus with other circulatory complications: Secondary | ICD-10-CM | POA: Diagnosis not present

## 2023-10-25 DIAGNOSIS — I700 Atherosclerosis of aorta: Secondary | ICD-10-CM | POA: Diagnosis not present

## 2023-10-25 DIAGNOSIS — I10 Essential (primary) hypertension: Secondary | ICD-10-CM | POA: Diagnosis not present

## 2023-10-25 DIAGNOSIS — R2681 Unsteadiness on feet: Secondary | ICD-10-CM | POA: Diagnosis not present

## 2023-10-26 DIAGNOSIS — M6259 Muscle wasting and atrophy, not elsewhere classified, multiple sites: Secondary | ICD-10-CM | POA: Diagnosis not present

## 2023-10-26 DIAGNOSIS — R2681 Unsteadiness on feet: Secondary | ICD-10-CM | POA: Diagnosis not present

## 2023-10-26 DIAGNOSIS — R2689 Other abnormalities of gait and mobility: Secondary | ICD-10-CM | POA: Diagnosis not present

## 2023-10-26 DIAGNOSIS — R278 Other lack of coordination: Secondary | ICD-10-CM | POA: Diagnosis not present

## 2023-10-27 DIAGNOSIS — R2689 Other abnormalities of gait and mobility: Secondary | ICD-10-CM | POA: Diagnosis not present

## 2023-10-27 DIAGNOSIS — M6259 Muscle wasting and atrophy, not elsewhere classified, multiple sites: Secondary | ICD-10-CM | POA: Diagnosis not present

## 2023-10-27 DIAGNOSIS — R278 Other lack of coordination: Secondary | ICD-10-CM | POA: Diagnosis not present

## 2023-10-27 DIAGNOSIS — R2681 Unsteadiness on feet: Secondary | ICD-10-CM | POA: Diagnosis not present

## 2023-10-28 DIAGNOSIS — R2681 Unsteadiness on feet: Secondary | ICD-10-CM | POA: Diagnosis not present

## 2023-10-28 DIAGNOSIS — R2689 Other abnormalities of gait and mobility: Secondary | ICD-10-CM | POA: Diagnosis not present

## 2023-10-28 DIAGNOSIS — R278 Other lack of coordination: Secondary | ICD-10-CM | POA: Diagnosis not present

## 2023-10-28 DIAGNOSIS — M6259 Muscle wasting and atrophy, not elsewhere classified, multiple sites: Secondary | ICD-10-CM | POA: Diagnosis not present

## 2023-10-29 DIAGNOSIS — M62512 Muscle wasting and atrophy, not elsewhere classified, left shoulder: Secondary | ICD-10-CM | POA: Diagnosis not present

## 2023-10-29 DIAGNOSIS — R2681 Unsteadiness on feet: Secondary | ICD-10-CM | POA: Diagnosis not present

## 2023-10-29 DIAGNOSIS — M62542 Muscle wasting and atrophy, not elsewhere classified, left hand: Secondary | ICD-10-CM | POA: Diagnosis not present

## 2023-11-01 DIAGNOSIS — R2681 Unsteadiness on feet: Secondary | ICD-10-CM | POA: Diagnosis not present

## 2023-11-01 DIAGNOSIS — M6259 Muscle wasting and atrophy, not elsewhere classified, multiple sites: Secondary | ICD-10-CM | POA: Diagnosis not present

## 2023-11-01 DIAGNOSIS — R2689 Other abnormalities of gait and mobility: Secondary | ICD-10-CM | POA: Diagnosis not present

## 2023-11-01 DIAGNOSIS — R278 Other lack of coordination: Secondary | ICD-10-CM | POA: Diagnosis not present

## 2023-11-01 DIAGNOSIS — M62512 Muscle wasting and atrophy, not elsewhere classified, left shoulder: Secondary | ICD-10-CM | POA: Diagnosis not present

## 2023-11-01 DIAGNOSIS — M62542 Muscle wasting and atrophy, not elsewhere classified, left hand: Secondary | ICD-10-CM | POA: Diagnosis not present

## 2023-11-02 DIAGNOSIS — M62542 Muscle wasting and atrophy, not elsewhere classified, left hand: Secondary | ICD-10-CM | POA: Diagnosis not present

## 2023-11-02 DIAGNOSIS — R2681 Unsteadiness on feet: Secondary | ICD-10-CM | POA: Diagnosis not present

## 2023-11-02 DIAGNOSIS — M62512 Muscle wasting and atrophy, not elsewhere classified, left shoulder: Secondary | ICD-10-CM | POA: Diagnosis not present

## 2023-11-03 DIAGNOSIS — R2689 Other abnormalities of gait and mobility: Secondary | ICD-10-CM | POA: Diagnosis not present

## 2023-11-03 DIAGNOSIS — R278 Other lack of coordination: Secondary | ICD-10-CM | POA: Diagnosis not present

## 2023-11-03 DIAGNOSIS — R2681 Unsteadiness on feet: Secondary | ICD-10-CM | POA: Diagnosis not present

## 2023-11-03 DIAGNOSIS — M6259 Muscle wasting and atrophy, not elsewhere classified, multiple sites: Secondary | ICD-10-CM | POA: Diagnosis not present

## 2023-11-04 DIAGNOSIS — M62542 Muscle wasting and atrophy, not elsewhere classified, left hand: Secondary | ICD-10-CM | POA: Diagnosis not present

## 2023-11-04 DIAGNOSIS — R2681 Unsteadiness on feet: Secondary | ICD-10-CM | POA: Diagnosis not present

## 2023-11-04 DIAGNOSIS — M62512 Muscle wasting and atrophy, not elsewhere classified, left shoulder: Secondary | ICD-10-CM | POA: Diagnosis not present

## 2023-11-05 DIAGNOSIS — R2689 Other abnormalities of gait and mobility: Secondary | ICD-10-CM | POA: Diagnosis not present

## 2023-11-05 DIAGNOSIS — R278 Other lack of coordination: Secondary | ICD-10-CM | POA: Diagnosis not present

## 2023-11-05 DIAGNOSIS — R2681 Unsteadiness on feet: Secondary | ICD-10-CM | POA: Diagnosis not present

## 2023-11-05 DIAGNOSIS — M6259 Muscle wasting and atrophy, not elsewhere classified, multiple sites: Secondary | ICD-10-CM | POA: Diagnosis not present

## 2023-11-08 DIAGNOSIS — M62512 Muscle wasting and atrophy, not elsewhere classified, left shoulder: Secondary | ICD-10-CM | POA: Diagnosis not present

## 2023-11-08 DIAGNOSIS — R2681 Unsteadiness on feet: Secondary | ICD-10-CM | POA: Diagnosis not present

## 2023-11-08 DIAGNOSIS — M62542 Muscle wasting and atrophy, not elsewhere classified, left hand: Secondary | ICD-10-CM | POA: Diagnosis not present

## 2023-11-09 DIAGNOSIS — E7800 Pure hypercholesterolemia, unspecified: Secondary | ICD-10-CM | POA: Diagnosis not present

## 2023-11-09 DIAGNOSIS — R278 Other lack of coordination: Secondary | ICD-10-CM | POA: Diagnosis not present

## 2023-11-09 DIAGNOSIS — E1169 Type 2 diabetes mellitus with other specified complication: Secondary | ICD-10-CM | POA: Diagnosis not present

## 2023-11-09 DIAGNOSIS — R2689 Other abnormalities of gait and mobility: Secondary | ICD-10-CM | POA: Diagnosis not present

## 2023-11-09 DIAGNOSIS — R2681 Unsteadiness on feet: Secondary | ICD-10-CM | POA: Diagnosis not present

## 2023-11-09 DIAGNOSIS — M6259 Muscle wasting and atrophy, not elsewhere classified, multiple sites: Secondary | ICD-10-CM | POA: Diagnosis not present

## 2023-11-09 DIAGNOSIS — I10 Essential (primary) hypertension: Secondary | ICD-10-CM | POA: Diagnosis not present

## 2023-11-09 DIAGNOSIS — E119 Type 2 diabetes mellitus without complications: Secondary | ICD-10-CM | POA: Diagnosis not present

## 2023-11-10 DIAGNOSIS — R2689 Other abnormalities of gait and mobility: Secondary | ICD-10-CM | POA: Diagnosis not present

## 2023-11-10 DIAGNOSIS — R278 Other lack of coordination: Secondary | ICD-10-CM | POA: Diagnosis not present

## 2023-11-10 DIAGNOSIS — R2681 Unsteadiness on feet: Secondary | ICD-10-CM | POA: Diagnosis not present

## 2023-11-10 DIAGNOSIS — M6259 Muscle wasting and atrophy, not elsewhere classified, multiple sites: Secondary | ICD-10-CM | POA: Diagnosis not present

## 2023-11-11 DIAGNOSIS — R278 Other lack of coordination: Secondary | ICD-10-CM | POA: Diagnosis not present

## 2023-11-11 DIAGNOSIS — R2681 Unsteadiness on feet: Secondary | ICD-10-CM | POA: Diagnosis not present

## 2023-11-11 DIAGNOSIS — R2689 Other abnormalities of gait and mobility: Secondary | ICD-10-CM | POA: Diagnosis not present

## 2023-11-11 DIAGNOSIS — M6259 Muscle wasting and atrophy, not elsewhere classified, multiple sites: Secondary | ICD-10-CM | POA: Diagnosis not present

## 2023-11-12 DIAGNOSIS — R2681 Unsteadiness on feet: Secondary | ICD-10-CM | POA: Diagnosis not present

## 2023-11-12 DIAGNOSIS — M62542 Muscle wasting and atrophy, not elsewhere classified, left hand: Secondary | ICD-10-CM | POA: Diagnosis not present

## 2023-11-12 DIAGNOSIS — M62512 Muscle wasting and atrophy, not elsewhere classified, left shoulder: Secondary | ICD-10-CM | POA: Diagnosis not present

## 2023-11-15 DIAGNOSIS — R2681 Unsteadiness on feet: Secondary | ICD-10-CM | POA: Diagnosis not present

## 2023-11-15 DIAGNOSIS — M62512 Muscle wasting and atrophy, not elsewhere classified, left shoulder: Secondary | ICD-10-CM | POA: Diagnosis not present

## 2023-11-15 DIAGNOSIS — M62542 Muscle wasting and atrophy, not elsewhere classified, left hand: Secondary | ICD-10-CM | POA: Diagnosis not present

## 2023-11-16 DIAGNOSIS — M6259 Muscle wasting and atrophy, not elsewhere classified, multiple sites: Secondary | ICD-10-CM | POA: Diagnosis not present

## 2023-11-16 DIAGNOSIS — R2681 Unsteadiness on feet: Secondary | ICD-10-CM | POA: Diagnosis not present

## 2023-11-16 DIAGNOSIS — R278 Other lack of coordination: Secondary | ICD-10-CM | POA: Diagnosis not present

## 2023-11-16 DIAGNOSIS — R2689 Other abnormalities of gait and mobility: Secondary | ICD-10-CM | POA: Diagnosis not present

## 2023-11-17 DIAGNOSIS — R2681 Unsteadiness on feet: Secondary | ICD-10-CM | POA: Diagnosis not present

## 2023-11-17 DIAGNOSIS — R2689 Other abnormalities of gait and mobility: Secondary | ICD-10-CM | POA: Diagnosis not present

## 2023-11-17 DIAGNOSIS — M6259 Muscle wasting and atrophy, not elsewhere classified, multiple sites: Secondary | ICD-10-CM | POA: Diagnosis not present

## 2023-11-17 DIAGNOSIS — R278 Other lack of coordination: Secondary | ICD-10-CM | POA: Diagnosis not present

## 2023-11-18 DIAGNOSIS — M62542 Muscle wasting and atrophy, not elsewhere classified, left hand: Secondary | ICD-10-CM | POA: Diagnosis not present

## 2023-11-18 DIAGNOSIS — R2681 Unsteadiness on feet: Secondary | ICD-10-CM | POA: Diagnosis not present

## 2023-11-18 DIAGNOSIS — M62512 Muscle wasting and atrophy, not elsewhere classified, left shoulder: Secondary | ICD-10-CM | POA: Diagnosis not present

## 2023-11-19 DIAGNOSIS — M6259 Muscle wasting and atrophy, not elsewhere classified, multiple sites: Secondary | ICD-10-CM | POA: Diagnosis not present

## 2023-11-19 DIAGNOSIS — S161XXA Strain of muscle, fascia and tendon at neck level, initial encounter: Secondary | ICD-10-CM | POA: Diagnosis not present

## 2023-11-19 DIAGNOSIS — M79674 Pain in right toe(s): Secondary | ICD-10-CM | POA: Diagnosis not present

## 2023-11-22 DIAGNOSIS — M62512 Muscle wasting and atrophy, not elsewhere classified, left shoulder: Secondary | ICD-10-CM | POA: Diagnosis not present

## 2023-11-22 DIAGNOSIS — R2681 Unsteadiness on feet: Secondary | ICD-10-CM | POA: Diagnosis not present

## 2023-11-22 DIAGNOSIS — M62542 Muscle wasting and atrophy, not elsewhere classified, left hand: Secondary | ICD-10-CM | POA: Diagnosis not present

## 2023-11-23 DIAGNOSIS — M6259 Muscle wasting and atrophy, not elsewhere classified, multiple sites: Secondary | ICD-10-CM | POA: Diagnosis not present

## 2023-11-23 DIAGNOSIS — R2681 Unsteadiness on feet: Secondary | ICD-10-CM | POA: Diagnosis not present

## 2023-11-23 DIAGNOSIS — R278 Other lack of coordination: Secondary | ICD-10-CM | POA: Diagnosis not present

## 2023-11-24 DIAGNOSIS — R278 Other lack of coordination: Secondary | ICD-10-CM | POA: Diagnosis not present

## 2023-11-24 DIAGNOSIS — M6259 Muscle wasting and atrophy, not elsewhere classified, multiple sites: Secondary | ICD-10-CM | POA: Diagnosis not present

## 2023-11-24 DIAGNOSIS — M62512 Muscle wasting and atrophy, not elsewhere classified, left shoulder: Secondary | ICD-10-CM | POA: Diagnosis not present

## 2023-11-24 DIAGNOSIS — R2689 Other abnormalities of gait and mobility: Secondary | ICD-10-CM | POA: Diagnosis not present

## 2023-11-24 DIAGNOSIS — R2681 Unsteadiness on feet: Secondary | ICD-10-CM | POA: Diagnosis not present

## 2023-11-25 DIAGNOSIS — M6259 Muscle wasting and atrophy, not elsewhere classified, multiple sites: Secondary | ICD-10-CM | POA: Diagnosis not present

## 2023-11-25 DIAGNOSIS — R278 Other lack of coordination: Secondary | ICD-10-CM | POA: Diagnosis not present

## 2023-11-25 DIAGNOSIS — R2681 Unsteadiness on feet: Secondary | ICD-10-CM | POA: Diagnosis not present

## 2023-11-25 DIAGNOSIS — R2689 Other abnormalities of gait and mobility: Secondary | ICD-10-CM | POA: Diagnosis not present

## 2023-11-26 DIAGNOSIS — M62512 Muscle wasting and atrophy, not elsewhere classified, left shoulder: Secondary | ICD-10-CM | POA: Diagnosis not present

## 2023-11-26 DIAGNOSIS — M62542 Muscle wasting and atrophy, not elsewhere classified, left hand: Secondary | ICD-10-CM | POA: Diagnosis not present

## 2023-11-29 DIAGNOSIS — R2689 Other abnormalities of gait and mobility: Secondary | ICD-10-CM | POA: Diagnosis not present

## 2023-11-29 DIAGNOSIS — R2681 Unsteadiness on feet: Secondary | ICD-10-CM | POA: Diagnosis not present

## 2023-11-29 DIAGNOSIS — R278 Other lack of coordination: Secondary | ICD-10-CM | POA: Diagnosis not present

## 2023-11-29 DIAGNOSIS — M6259 Muscle wasting and atrophy, not elsewhere classified, multiple sites: Secondary | ICD-10-CM | POA: Diagnosis not present

## 2023-11-30 DIAGNOSIS — R2689 Other abnormalities of gait and mobility: Secondary | ICD-10-CM | POA: Diagnosis not present

## 2023-11-30 DIAGNOSIS — M62542 Muscle wasting and atrophy, not elsewhere classified, left hand: Secondary | ICD-10-CM | POA: Diagnosis not present

## 2023-11-30 DIAGNOSIS — R2681 Unsteadiness on feet: Secondary | ICD-10-CM | POA: Diagnosis not present

## 2023-11-30 DIAGNOSIS — M62512 Muscle wasting and atrophy, not elsewhere classified, left shoulder: Secondary | ICD-10-CM | POA: Diagnosis not present

## 2023-11-30 DIAGNOSIS — M6259 Muscle wasting and atrophy, not elsewhere classified, multiple sites: Secondary | ICD-10-CM | POA: Diagnosis not present

## 2023-11-30 DIAGNOSIS — R278 Other lack of coordination: Secondary | ICD-10-CM | POA: Diagnosis not present

## 2023-12-01 DIAGNOSIS — M62512 Muscle wasting and atrophy, not elsewhere classified, left shoulder: Secondary | ICD-10-CM | POA: Diagnosis not present

## 2023-12-01 DIAGNOSIS — M62542 Muscle wasting and atrophy, not elsewhere classified, left hand: Secondary | ICD-10-CM | POA: Diagnosis not present

## 2023-12-01 DIAGNOSIS — R2681 Unsteadiness on feet: Secondary | ICD-10-CM | POA: Diagnosis not present

## 2023-12-02 DIAGNOSIS — R2689 Other abnormalities of gait and mobility: Secondary | ICD-10-CM | POA: Diagnosis not present

## 2023-12-02 DIAGNOSIS — M6259 Muscle wasting and atrophy, not elsewhere classified, multiple sites: Secondary | ICD-10-CM | POA: Diagnosis not present

## 2023-12-02 DIAGNOSIS — R2681 Unsteadiness on feet: Secondary | ICD-10-CM | POA: Diagnosis not present

## 2023-12-02 DIAGNOSIS — R278 Other lack of coordination: Secondary | ICD-10-CM | POA: Diagnosis not present

## 2023-12-03 DIAGNOSIS — R2681 Unsteadiness on feet: Secondary | ICD-10-CM | POA: Diagnosis not present

## 2023-12-06 DIAGNOSIS — R278 Other lack of coordination: Secondary | ICD-10-CM | POA: Diagnosis not present

## 2023-12-06 DIAGNOSIS — M6259 Muscle wasting and atrophy, not elsewhere classified, multiple sites: Secondary | ICD-10-CM | POA: Diagnosis not present

## 2023-12-06 DIAGNOSIS — R2681 Unsteadiness on feet: Secondary | ICD-10-CM | POA: Diagnosis not present

## 2023-12-06 DIAGNOSIS — R2689 Other abnormalities of gait and mobility: Secondary | ICD-10-CM | POA: Diagnosis not present

## 2023-12-07 DIAGNOSIS — M62542 Muscle wasting and atrophy, not elsewhere classified, left hand: Secondary | ICD-10-CM | POA: Diagnosis not present

## 2023-12-07 DIAGNOSIS — R2681 Unsteadiness on feet: Secondary | ICD-10-CM | POA: Diagnosis not present

## 2023-12-07 DIAGNOSIS — M62512 Muscle wasting and atrophy, not elsewhere classified, left shoulder: Secondary | ICD-10-CM | POA: Diagnosis not present

## 2023-12-08 DIAGNOSIS — R2689 Other abnormalities of gait and mobility: Secondary | ICD-10-CM | POA: Diagnosis not present

## 2023-12-08 DIAGNOSIS — M62512 Muscle wasting and atrophy, not elsewhere classified, left shoulder: Secondary | ICD-10-CM | POA: Diagnosis not present

## 2023-12-08 DIAGNOSIS — M6259 Muscle wasting and atrophy, not elsewhere classified, multiple sites: Secondary | ICD-10-CM | POA: Diagnosis not present

## 2023-12-08 DIAGNOSIS — R2681 Unsteadiness on feet: Secondary | ICD-10-CM | POA: Diagnosis not present

## 2023-12-08 DIAGNOSIS — R278 Other lack of coordination: Secondary | ICD-10-CM | POA: Diagnosis not present

## 2023-12-08 DIAGNOSIS — M62542 Muscle wasting and atrophy, not elsewhere classified, left hand: Secondary | ICD-10-CM | POA: Diagnosis not present

## 2023-12-09 DIAGNOSIS — M6259 Muscle wasting and atrophy, not elsewhere classified, multiple sites: Secondary | ICD-10-CM | POA: Diagnosis not present

## 2023-12-09 DIAGNOSIS — R2689 Other abnormalities of gait and mobility: Secondary | ICD-10-CM | POA: Diagnosis not present

## 2023-12-09 DIAGNOSIS — R2681 Unsteadiness on feet: Secondary | ICD-10-CM | POA: Diagnosis not present

## 2023-12-09 DIAGNOSIS — R278 Other lack of coordination: Secondary | ICD-10-CM | POA: Diagnosis not present

## 2023-12-10 DIAGNOSIS — R2681 Unsteadiness on feet: Secondary | ICD-10-CM | POA: Diagnosis not present

## 2023-12-13 DIAGNOSIS — M62542 Muscle wasting and atrophy, not elsewhere classified, left hand: Secondary | ICD-10-CM | POA: Diagnosis not present

## 2023-12-13 DIAGNOSIS — R2681 Unsteadiness on feet: Secondary | ICD-10-CM | POA: Diagnosis not present

## 2023-12-13 DIAGNOSIS — M62512 Muscle wasting and atrophy, not elsewhere classified, left shoulder: Secondary | ICD-10-CM | POA: Diagnosis not present

## 2023-12-14 DIAGNOSIS — M62542 Muscle wasting and atrophy, not elsewhere classified, left hand: Secondary | ICD-10-CM | POA: Diagnosis not present

## 2023-12-14 DIAGNOSIS — M62512 Muscle wasting and atrophy, not elsewhere classified, left shoulder: Secondary | ICD-10-CM | POA: Diagnosis not present

## 2023-12-14 DIAGNOSIS — R2681 Unsteadiness on feet: Secondary | ICD-10-CM | POA: Diagnosis not present

## 2023-12-15 ENCOUNTER — Ambulatory Visit: Admitting: Podiatry

## 2023-12-16 DIAGNOSIS — M62542 Muscle wasting and atrophy, not elsewhere classified, left hand: Secondary | ICD-10-CM | POA: Diagnosis not present

## 2023-12-16 DIAGNOSIS — M62512 Muscle wasting and atrophy, not elsewhere classified, left shoulder: Secondary | ICD-10-CM | POA: Diagnosis not present

## 2023-12-16 DIAGNOSIS — R2681 Unsteadiness on feet: Secondary | ICD-10-CM | POA: Diagnosis not present

## 2023-12-20 DIAGNOSIS — M62542 Muscle wasting and atrophy, not elsewhere classified, left hand: Secondary | ICD-10-CM | POA: Diagnosis not present

## 2023-12-20 DIAGNOSIS — R2681 Unsteadiness on feet: Secondary | ICD-10-CM | POA: Diagnosis not present

## 2023-12-20 DIAGNOSIS — M62512 Muscle wasting and atrophy, not elsewhere classified, left shoulder: Secondary | ICD-10-CM | POA: Diagnosis not present

## 2023-12-21 ENCOUNTER — Ambulatory Visit (INDEPENDENT_AMBULATORY_CARE_PROVIDER_SITE_OTHER): Admitting: Podiatry

## 2023-12-21 VITALS — Ht 65.0 in | Wt 141.0 lb

## 2023-12-21 DIAGNOSIS — R2681 Unsteadiness on feet: Secondary | ICD-10-CM | POA: Diagnosis not present

## 2023-12-21 DIAGNOSIS — L600 Ingrowing nail: Secondary | ICD-10-CM | POA: Diagnosis not present

## 2023-12-21 DIAGNOSIS — M62512 Muscle wasting and atrophy, not elsewhere classified, left shoulder: Secondary | ICD-10-CM | POA: Diagnosis not present

## 2023-12-21 DIAGNOSIS — M62542 Muscle wasting and atrophy, not elsewhere classified, left hand: Secondary | ICD-10-CM | POA: Diagnosis not present

## 2023-12-23 DIAGNOSIS — M62542 Muscle wasting and atrophy, not elsewhere classified, left hand: Secondary | ICD-10-CM | POA: Diagnosis not present

## 2023-12-23 DIAGNOSIS — R2681 Unsteadiness on feet: Secondary | ICD-10-CM | POA: Diagnosis not present

## 2023-12-23 DIAGNOSIS — M62512 Muscle wasting and atrophy, not elsewhere classified, left shoulder: Secondary | ICD-10-CM | POA: Diagnosis not present

## 2023-12-24 NOTE — Progress Notes (Signed)
  Subjective:  Patient ID: Kathleen Figueroa, female    DOB: 16-Aug-1935,  MRN: [id number]  Chief Complaint  Patient presents with   Nail Problem    Rm 2 Patient is here for ingrown toe nail of the right hallux. Nail is thicken and discolored. Pt states pain in toe since January 2025.    88 y.o. female presents with the above complaint. History confirmed with patient.   Objective:  Physical Exam: warm, good capillary refill, no trophic changes or ulcerative lesions, normal DP and PT pulses, normal sensory exam, and thickened right hallux nail with dystrophic growth  Assessment:   1. Ingrowing right great toenail      Plan:  Patient was evaluated and treated and all questions answered.  Ingrown right great toenail with dystrophic growth I debrided the nail SII fashion to alleviate the offending borders on both sides, this alleviated most of the issue for her discussed possibility of partial permanent matricectomy if this worsens.  Follow-up with me as needed.     No follow-ups on file.

## 2023-12-29 DIAGNOSIS — R2681 Unsteadiness on feet: Secondary | ICD-10-CM | POA: Diagnosis not present

## 2023-12-29 DIAGNOSIS — M62512 Muscle wasting and atrophy, not elsewhere classified, left shoulder: Secondary | ICD-10-CM | POA: Diagnosis not present

## 2023-12-29 DIAGNOSIS — M62542 Muscle wasting and atrophy, not elsewhere classified, left hand: Secondary | ICD-10-CM | POA: Diagnosis not present

## 2023-12-30 DIAGNOSIS — R2681 Unsteadiness on feet: Secondary | ICD-10-CM | POA: Diagnosis not present

## 2023-12-31 DIAGNOSIS — M62512 Muscle wasting and atrophy, not elsewhere classified, left shoulder: Secondary | ICD-10-CM | POA: Diagnosis not present

## 2023-12-31 DIAGNOSIS — M62542 Muscle wasting and atrophy, not elsewhere classified, left hand: Secondary | ICD-10-CM | POA: Diagnosis not present

## 2023-12-31 DIAGNOSIS — R2681 Unsteadiness on feet: Secondary | ICD-10-CM | POA: Diagnosis not present

## 2024-01-04 DIAGNOSIS — R2681 Unsteadiness on feet: Secondary | ICD-10-CM | POA: Diagnosis not present

## 2024-01-08 ENCOUNTER — Emergency Department (HOSPITAL_COMMUNITY)

## 2024-01-08 ENCOUNTER — Other Ambulatory Visit: Payer: Self-pay

## 2024-01-08 ENCOUNTER — Inpatient Hospital Stay (HOSPITAL_COMMUNITY)

## 2024-01-08 ENCOUNTER — Inpatient Hospital Stay (HOSPITAL_COMMUNITY)
Admission: EM | Admit: 2024-01-08 | Discharge: 2024-01-10 | DRG: 092 | Disposition: A | Discharge status: Skilled Nursing Facility | Source: Skilled Nursing Facility | Attending: Hospitalist | Admitting: Hospitalist

## 2024-01-08 DIAGNOSIS — R471 Dysarthria and anarthria: Secondary | ICD-10-CM | POA: Diagnosis present

## 2024-01-08 DIAGNOSIS — Z888 Allergy status to other drugs, medicaments and biological substances status: Secondary | ICD-10-CM

## 2024-01-08 DIAGNOSIS — I10 Essential (primary) hypertension: Secondary | ICD-10-CM | POA: Diagnosis not present

## 2024-01-08 DIAGNOSIS — G9389 Other specified disorders of brain: Secondary | ICD-10-CM | POA: Diagnosis not present

## 2024-01-08 DIAGNOSIS — I129 Hypertensive chronic kidney disease with stage 1 through stage 4 chronic kidney disease, or unspecified chronic kidney disease: Secondary | ICD-10-CM | POA: Diagnosis present

## 2024-01-08 DIAGNOSIS — R0902 Hypoxemia: Secondary | ICD-10-CM | POA: Diagnosis not present

## 2024-01-08 DIAGNOSIS — R9082 White matter disease, unspecified: Secondary | ICD-10-CM | POA: Diagnosis not present

## 2024-01-08 DIAGNOSIS — Z833 Family history of diabetes mellitus: Secondary | ICD-10-CM

## 2024-01-08 DIAGNOSIS — R569 Unspecified convulsions: Secondary | ICD-10-CM

## 2024-01-08 DIAGNOSIS — I6523 Occlusion and stenosis of bilateral carotid arteries: Secondary | ICD-10-CM | POA: Diagnosis not present

## 2024-01-08 DIAGNOSIS — Z7982 Long term (current) use of aspirin: Secondary | ICD-10-CM

## 2024-01-08 DIAGNOSIS — R29818 Other symptoms and signs involving the nervous system: Secondary | ICD-10-CM | POA: Diagnosis not present

## 2024-01-08 DIAGNOSIS — R202 Paresthesia of skin: Secondary | ICD-10-CM | POA: Diagnosis not present

## 2024-01-08 DIAGNOSIS — Z886 Allergy status to analgesic agent status: Secondary | ICD-10-CM

## 2024-01-08 DIAGNOSIS — G8384 Todd's paralysis (postepileptic): Secondary | ICD-10-CM | POA: Diagnosis present

## 2024-01-08 DIAGNOSIS — E785 Hyperlipidemia, unspecified: Secondary | ICD-10-CM | POA: Diagnosis present

## 2024-01-08 DIAGNOSIS — I651 Occlusion and stenosis of basilar artery: Secondary | ICD-10-CM | POA: Diagnosis not present

## 2024-01-08 DIAGNOSIS — R531 Weakness: Principal | ICD-10-CM

## 2024-01-08 DIAGNOSIS — I6782 Cerebral ischemia: Secondary | ICD-10-CM | POA: Diagnosis not present

## 2024-01-08 DIAGNOSIS — N1831 Chronic kidney disease, stage 3a: Secondary | ICD-10-CM | POA: Diagnosis present

## 2024-01-08 DIAGNOSIS — E1122 Type 2 diabetes mellitus with diabetic chronic kidney disease: Secondary | ICD-10-CM | POA: Diagnosis present

## 2024-01-08 DIAGNOSIS — H53462 Homonymous bilateral field defects, left side: Secondary | ICD-10-CM | POA: Diagnosis present

## 2024-01-08 DIAGNOSIS — I6503 Occlusion and stenosis of bilateral vertebral arteries: Secondary | ICD-10-CM | POA: Diagnosis not present

## 2024-01-08 DIAGNOSIS — Z79899 Other long term (current) drug therapy: Secondary | ICD-10-CM

## 2024-01-08 DIAGNOSIS — R29810 Facial weakness: Secondary | ICD-10-CM | POA: Diagnosis present

## 2024-01-08 DIAGNOSIS — Z87891 Personal history of nicotine dependence: Secondary | ICD-10-CM

## 2024-01-08 DIAGNOSIS — Z7902 Long term (current) use of antithrombotics/antiplatelets: Secondary | ICD-10-CM

## 2024-01-08 DIAGNOSIS — I63512 Cerebral infarction due to unspecified occlusion or stenosis of left middle cerebral artery: Secondary | ICD-10-CM | POA: Diagnosis not present

## 2024-01-08 DIAGNOSIS — I69354 Hemiplegia and hemiparesis following cerebral infarction affecting left non-dominant side: Secondary | ICD-10-CM

## 2024-01-08 DIAGNOSIS — Z8249 Family history of ischemic heart disease and other diseases of the circulatory system: Secondary | ICD-10-CM

## 2024-01-08 DIAGNOSIS — I672 Cerebral atherosclerosis: Secondary | ICD-10-CM | POA: Diagnosis not present

## 2024-01-08 DIAGNOSIS — G928 Other toxic encephalopathy: Principal | ICD-10-CM | POA: Diagnosis present

## 2024-01-08 LAB — CBC
HCT: 43.8 % (ref 36.0–46.0)
Hemoglobin: 13.4 g/dL (ref 12.0–15.0)
MCH: 25.9 pg — ABNORMAL LOW (ref 26.0–34.0)
MCHC: 30.6 g/dL (ref 30.0–36.0)
MCV: 84.6 fL (ref 80.0–100.0)
Platelets: 229 K/uL (ref 150–400)
RBC: 5.18 MIL/uL — ABNORMAL HIGH (ref 3.87–5.11)
RDW: 15.9 % — ABNORMAL HIGH (ref 11.5–15.5)
WBC: 5.4 K/uL (ref 4.0–10.5)
nRBC: 0 % (ref 0.0–0.2)

## 2024-01-08 LAB — GLUCOSE, CAPILLARY
Glucose-Capillary: 104 mg/dL — ABNORMAL HIGH (ref 70–99)
Glucose-Capillary: 84 mg/dL (ref 70–99)

## 2024-01-08 LAB — I-STAT CHEM 8, ED
BUN: 30 mg/dL — ABNORMAL HIGH (ref 8–23)
Calcium, Ion: 1.17 mmol/L (ref 1.15–1.40)
Chloride: 102 mmol/L (ref 98–111)
Creatinine, Ser: 1.1 mg/dL — ABNORMAL HIGH (ref 0.44–1.00)
Glucose, Bld: 108 mg/dL — ABNORMAL HIGH (ref 70–99)
HCT: 44 % (ref 36.0–46.0)
Hemoglobin: 15 g/dL (ref 12.0–15.0)
Potassium: 4.2 mmol/L (ref 3.5–5.1)
Sodium: 139 mmol/L (ref 135–145)
TCO2: 29 mmol/L (ref 22–32)

## 2024-01-08 LAB — COMPREHENSIVE METABOLIC PANEL WITH GFR
ALT: 10 U/L (ref 0–44)
AST: 26 U/L (ref 15–41)
Albumin: 3.5 g/dL (ref 3.5–5.0)
Alkaline Phosphatase: 83 U/L (ref 38–126)
Anion gap: 11 (ref 5–15)
BUN: 23 mg/dL (ref 8–23)
CO2: 25 mmol/L (ref 22–32)
Calcium: 9 mg/dL (ref 8.9–10.3)
Chloride: 102 mmol/L (ref 98–111)
Creatinine, Ser: 1 mg/dL (ref 0.44–1.00)
GFR, Estimated: 55 mL/min — ABNORMAL LOW (ref >60)
Glucose, Bld: 112 mg/dL — ABNORMAL HIGH (ref 70–99)
Potassium: 4.3 mmol/L (ref 3.5–5.1)
Sodium: 138 mmol/L (ref 135–145)
Total Bilirubin: 0.7 mg/dL (ref 0.0–1.2)
Total Protein: 6.8 g/dL (ref 6.5–8.1)

## 2024-01-08 LAB — DIFFERENTIAL
Abs Immature Granulocytes: 0.01 K/uL (ref 0.00–0.07)
Basophils Absolute: 0.1 K/uL (ref 0.0–0.1)
Basophils Relative: 1 %
Eosinophils Absolute: 0.2 K/uL (ref 0.0–0.5)
Eosinophils Relative: 3 %
Immature Granulocytes: 0 %
Lymphocytes Relative: 33 %
Lymphs Abs: 1.8 K/uL (ref 0.7–4.0)
Monocytes Absolute: 0.7 K/uL (ref 0.1–1.0)
Monocytes Relative: 13 %
Neutro Abs: 2.7 K/uL (ref 1.7–7.7)
Neutrophils Relative %: 50 %

## 2024-01-08 LAB — APTT: aPTT: 31 s (ref 24–36)

## 2024-01-08 LAB — ETHANOL: Alcohol, Ethyl (B): <15 mg/dL (ref <15)

## 2024-01-08 LAB — PROTIME-INR
INR: 1 (ref 0.8–1.2)
Prothrombin Time: 13.2 s (ref 11.4–15.2)

## 2024-01-08 MED ORDER — LORAZEPAM 2 MG/ML IJ SOLN: 2.0000 mg | Freq: Once | INTRAMUSCULAR | Status: AC

## 2024-01-08 MED ORDER — LEVETIRACETAM (KEPPRA) 500 MG/5 ML ADULT IV PUSH
500.0000 mg | Freq: Two times a day (BID) | INTRAVENOUS | Status: DC
  Administered 2024-01-08 – 2024-01-10 (×4): 500 mg via INTRAVENOUS
  Filled 2024-01-08 (×4): qty 5

## 2024-01-08 MED ORDER — IOHEXOL 350 MG/ML SOLN
75.0000 mL | Freq: Once | INTRAVENOUS | Status: AC | PRN
  Administered 2024-01-08: 75 mL via INTRAVENOUS

## 2024-01-08 MED ORDER — ENOXAPARIN SODIUM 40 MG/0.4ML IJ SOSY
40.0000 mg | PREFILLED_SYRINGE | INTRAMUSCULAR | Status: DC
  Administered 2024-01-08 – 2024-01-09 (×2): 40 mg via SUBCUTANEOUS
  Filled 2024-01-08 (×3): qty 0.4

## 2024-01-08 MED ORDER — SODIUM CHLORIDE 0.9% FLUSH
3.0000 mL | Freq: Once | INTRAVENOUS | Status: AC
  Administered 2024-01-08: 3 mL via INTRAVENOUS

## 2024-01-08 MED ORDER — LEVETIRACETAM (KEPPRA) 500 MG/5 ML ADULT IV PUSH
500.0000 mg | Freq: Once | INTRAVENOUS | Status: AC
  Administered 2024-01-08: 500 mg via INTRAVENOUS
  Filled 2024-01-08 (×2): qty 5

## 2024-01-08 MED ORDER — LORAZEPAM 2 MG/ML IJ SOLN
INTRAMUSCULAR | Status: AC
  Administered 2024-01-08: 2 mg via INTRAVENOUS
  Filled 2024-01-08: qty 1

## 2024-01-08 MED ORDER — ORAL CARE MOUTH RINSE: 15.0000 mL | OROMUCOSAL | Status: DC | PRN

## 2024-01-08 NOTE — Consult Note (Signed)
 NEUROLOGY CONSULT NOTE   Date of service: January 08, 2024 Patient Name: Kathleen Figueroa MRN:  [id number] DOB:  1935/05/25 Chief Complaint: Increased left-sided weakness Requesting Provider: Georgina Basket, MD  History of Present Illness  Kathleen Figueroa is a 88 y.o. 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  Patient was at her rehabilitation facility and was noted to have some twitching of the left arm today around 12:30pm. Per daughter, pt complained left arm some tingling at that time but then the arm started with small twitching and then became more significant shaking.  Afterwards, she had increased weakness on the left side.  Code stroke activated. Left arm twitching was seen on arrival to the emergency department, and 2 mg of lorazepam was administered with cessation of seizure activity. Pt also was sedated later. CT no acute change, and CTA head and neck no LVO. Given his stroke risk, took her for stat MRI and did not show acute infarct. Put on keppra and will check EEG.   LKW: 1240 Modified rankin score: 4-Needs assistance to walk and tend to bodily needs IV Thrombolysis: No, no stroke on MRI, likely focal seizure EVT: No, no LVO and high mRS  NIHSS components Score: Comment  1a Level of Conscious 0[x]  1[]  2[]  3[]      1b LOC Questions 0[]  1[x]  2[]       1c LOC Commands 0[x]  1[]  2[]       2 Best Gaze 0[x]  1[]  2[]       3 Visual 0[]  1[]  2[x]  3[]      4 Facial Palsy 0[]  1[x]  2[]  3[]      5a Motor Arm - left 0[]  1[]  2[]  3[x]  4[]  UN[]    5b Motor Arm - Right 0[x]  1[]  2[]  3[]  4[]  UN[]    6a Motor Leg - Left 0[]  1[]  2[]  3[x]  4[]  UN[]    6b Motor Leg - Right 0[x]  1[]  2[]  3[]  4[]  UN[]    7 Limb Ataxia 0[x]  1[]  2[]  UN[]      8 Sensory 0[x]  1[]  2[]  UN[]      9 Best Language 0[x]  1[]  2[]  3[]      10 Dysarthria 0[]  1[x]  2[]  UN[]      11  Extinct. 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)    CVA (cerebral vascular accident) (HCC) 11/12/2022   Diabetes mellitus type 2, uncontrolled, 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   H/O detached retina repair    Hypertension    TIA (transient ischemic attack)     Past Surgical History:  Procedure Laterality Date   ENDARTERECTOMY Left 01/19/2014   Procedure: ENDARTERECTOMY CAROTID-LEFT;  Surgeon: Gaile LELON New, MD;  Location: [facility] OR;  Service: Vascular;  Laterality: Left;   EYE SURGERY     lens removed from right eye   PATCH ANGIOPLASTY Left 01/19/2014   Procedure: PATCH ANGIOPLASTY, LEFT CAROTID ARTERY USING HEMASHIELD PLATINUM FINESSE PATCH;  Surgeon: Gaile LELON  Serene, MD;  Location: [facility] OR;  Service: Vascular;  Laterality: Left;   TONSILLECTOMY      Family History: Family History  Problem Relation Age of Onset   Hypertension Mother    Diabetes Mother    Hypertension Father     Social History  reports that she quit smoking about 45 years ago. Her smoking use included cigarettes. She has never used smokeless tobacco. She reports that she does not drink alcohol  and does not use drugs.  Allergies  Allergen Reactions   Aspirin  Nausea Only    Higher dose    Medications   Current Facility-Administered Medications:    enoxaparin  (LOVENOX ) injection 40 mg, 40 mg, Subcutaneous, Q24H, Georgina Basket, MD   levETIRAcetam (KEPPRA) undiluted injection 500 mg, 500 mg, Intravenous, Q12H, de La Torre, Cortney E, NP   sodium chloride  flush (NS) 0.9 %  injection 3 mL, 3 mL, Intravenous, Once, Kammerer, Megan L, DO  Current Outpatient Medications:    acetaminophen  (TYLENOL ) 325 MG tablet, Take 650 mg by mouth every 6 (six) hours as needed., Disp: , Rfl:    amLODipine  (NORVASC ) 10 MG tablet, Take 1 tablet (10 mg total) by mouth daily., Disp: 30 tablet, Rfl: 0   aspirin  EC 81 MG tablet, Take 1 tablet (81 mg total) by mouth daily. Swallow whole., Disp: , Rfl:    atorvastatin  (LIPITOR ) 80 MG tablet, Take 1 tablet (80 mg total) by mouth daily., Disp: , Rfl:    clopidogrel  (PLAVIX ) 75 MG tablet, Take 1 tablet (75 mg total) by mouth daily., Disp: 90 tablet, Rfl: 3   feeding supplement (BOOST HIGH PROTEIN) LIQD, Take 1 Container by mouth 3 (three) times daily between meals., Disp: , Rfl:    gabapentin  (NEURONTIN ) 100 MG capsule, Take 2 capsules (200 mg total) by mouth 3 (three) times daily., Disp: , Rfl:    lidocaine  (HM LIDOCAINE  PATCH) 4 %, Place 1 patch onto the skin daily., Disp: , Rfl:    polyethylene glycol (MIRALAX  / GLYCOLAX ) 17 g packet, Take 17 g by mouth 2 (two) times daily., Disp: , Rfl:   Vitals   Vitals:   01/08/24 1345 01/08/24 1350 01/08/24 1413 01/08/24 1422  BP: (!) 173/82 (!) 172/83    Pulse: 83 80    SpO2:   (!) 89% 95%  Weight:        Body mass index is 26.49 kg/m.   Physical Exam   Constitutional: Appears well-developed and well-nourished.  Psych: Affect appropriate to situation.  Eyes: No scleral injection.  HENT: No OP obstruction.  Head: Normocephalic.  Cardiovascular: Normal rate and regular rhythm.  Respiratory: Effort normal, non-labored breathing.  Skin: WDI.   Neurologic Examination    NEURO:  Mental Status: Patient is alert and oriented to person and place, disoriented to time and situation, became somewhat drowsy after lorazepam administration Speech/Language: speech is with mild dysarthria but without aphasia, able to name objects without difficulty  Cranial Nerves:  II: PERRL.  Left  hemianopsia III, IV, VI: EOMI. Eyelids elevate symmetrically.  V: Sensation is intact to light touch and symmetrical to face.  VII: Left facial droop VIII: hearing intact to voice. IX, X: Voice is dysarthric XII: tongue is midline without fasciculations. Motor: Able to move right upper extremity and right lower extremity with good antigravity strength, some movement of left upper and lower extremities but unable to break gravity Tone: is normal and bulk is normal Sensation- Intact to light touch bilaterally.  Coordination: FTN intact on the  right, unable to perform on the left Gait- deferred    Labs/Imaging/Neurodiagnostic studies   CBC:  Recent Labs  Lab 02-03-24 1338 2024/02/03 1348  WBC 5.4  --   NEUTROABS 2.7  --   HGB 13.4 15.0  HCT 43.8 44.0  MCV 84.6  --   PLT 229  --    Basic Metabolic Panel:  Lab Results  Component Value Date   NA 139 02-03-2024   K 4.2 February 03, 2024   CO2 25 02/03/2024   GLUCOSE 108 (H) 03-Feb-2024   BUN 30 (H) 02-03-2024   CREATININE 1.10 (H) 2024/02/03   CALCIUM  9.0 2024-02-03   GFRNONAA 55 (L) 02-03-2024   GFRAA 37 (L) 05/06/2018   Lipid Panel:  Lab Results  Component Value Date   LDLCALC 193 (H) 02/12/2023   HgbA1c:  Lab Results  Component Value Date   HGBA1C 6.4 (H) 02/12/2023   Urine Drug Screen:     Component Value Date/Time   LABOPIA NONE DETECTED 02/11/2023 1154   COCAINSCRNUR NONE DETECTED 02/11/2023 1154   LABBENZ NONE DETECTED 02/11/2023 1154   AMPHETMU NONE DETECTED 02/11/2023 1154   THCU NONE DETECTED 02/11/2023 1154   LABBARB NONE DETECTED 02/11/2023 1154    Alcohol  Level     Component Value Date/Time   ETH <15 Feb 03, 2024 1400   INR  Lab Results  Component Value Date   INR 1.0 Feb 03, 2024   APTT  Lab Results  Component Value Date   APTT 31 2024/02/03   CT Head without contrast(Personally reviewed): No acute abnormality, chronic small vessel ischemic disease, chronic right PCA and left MCA infarcts  CT  angio Head and Neck with contrast(Personally reviewed): No LVO, chronic occlusion of right vertebral artery and right P2 PCA, mild left intracranial ICA stenosis  MRI Brain(Personally reviewed): No acute infarct  Neurodiagnostics rEEG:  Pending  ASSESSMENT   Kathleen Figueroa is a 88 y.o. 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 Left arm twitching was seen on arrival to the ED, and 2 mg of lorazepam was administered with cessation of seizure activity. CT no acute change, and CTA head and neck no LVO. Given his stroke risk, took her for stat MRI and did not show acute infarct. 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  Will obtain EEG and start patient on maintenance Keppra after load.  RECOMMENDATIONS  -Routine EEG -Keppra 500 mg twice daily, load with 1000 mg - Lorazepam 1 to 2 mg IV for further seizure activity, please use with caution as it causes drowsiness - Seizure precautions - Neurology will continue to follow ______________________________________________________________________  Patient seen by NP with MD, MD to edit note as needed.  Signed, Cortney E Everitt Clint Kill, NP [facility]   ATTENDING NOTE: I reviewed above note and agree with the assessment and plan. Pt was seen and examined.   Pt came in with acute onset L arm twitching and shaking with increased LUE weakness which continued in ED. Received 2mg  ativan and pt became sleepy after. CT no acute infarct and CTA no change from previous CTA one year ago. MRI also done no acute infarct, pt symptoms could be focal seizure with todd's paralysis. Will check on EEG and load with keppra followed by maintenance dose. Will follow.   For detailed assessment  and plan,  please refer to above as I have made changes wherever appropriate.   I had long discussion with daughter and son in law at bedside, updated pt current condition, treatment plan and potential prognosis, and answered all the questions. They expressed understanding and appreciation.    Ary Cummins, MD PhD Stroke Neurology 01/08/2024 4:19 PM

## 2024-01-08 NOTE — Progress Notes (Signed)
 New Admission Note:   Arrival Method: Stretcher  Mental Orientation: patient is asleep  Telemetry: Box 3w 40 -04  Assessment: Completed Skin: intact dry, +3 edema bilaterally. Left side more swollen than the right  IV: 18 G right forearm  Pain: asleep  Tubes: [place] 2 L acute  Safety Measures: Safety Fall Prevention Plan has been given, discussed and signed Admission: Completed Orientation: Patient has been orientated to the room, unit and staff.  Family: Daughter and other family   Orders have been reviewed and implemented. Will continue to monitor the patient. Call light has been placed within reach and bed alarm has been activated.   Erie Browner RN [facility]  Phone: [phone]

## 2024-01-08 NOTE — Code Documentation (Signed)
 Stroke Response Nurse Documentation Code Documentation  Trula Frede is a 88 y.o. female arriving to [facility] via [facility] as Code Stroke. History of stroke with left weakness. LKW 1240. Family noted left arm twitching and then increased left weakness.   Stroke team met pt at bridge upon arrival. 2mg  IV ativan  given upon arrival due to left arm and left leg focal seizure noted. Labs obtained, pt cleared and taken to CT. NIH 11, see flowsheet for details. CT/CTA and then pt taken to MRI. No TNK as MRI negative for acute stroke. No thrombectomy as no LVO. Code Stroke: Cancelled. Bedside handoff with ED RN Mitzie.    Tonna Lacks K  Rapid Response RN

## 2024-01-08 NOTE — Plan of Care (Signed)
   Problem: Education: Goal: Knowledge of General Education information will improve Description Including pain rating scale, medication(s)/side effects and non-pharmacologic comfort measures Outcome: Progressing   Problem: Health Behavior/Discharge Planning: Goal: Ability to manage health-related needs will improve Outcome: Progressing

## 2024-01-08 NOTE — Procedures (Signed)
 History: 88 yo with previous stroke being evaluated for seizure  EEG Duration: 32 minutes  Sedation: ativan given earlier  Patient State: Awake and asleep  Technique: This EEG was acquired with electrodes placed according to the International 10-20 electrode system (including Fp1, Fp2, F3, F4, C3, C4, P3, P4, O1, O2, T3, T4, T5, T6, A1, A2, Fz, Cz, Pz). The following electrodes were missing or displaced: none.   Background: The background consists of intermixed alpha and beta activities. There is a well defined posterior dominant rhythm of 11 Hz that attenuates with eye opening. Sleep is recorded with normal appearing structures.   Photic stimulation: Physiologic driving is not performed  EEG Abnormalities: none  Clinical Interpretation: This normal EEG is recorded in the waking and sleep state. There was no seizure or seizure predisposition recorded on this study. Please note that lack of epileptiform activity on EEG does not preclude the possibility of epilepsy.   Aisha Seals, MD [facility]   If 7pm- 7am, please page neurology on call as listed in AMION.

## 2024-01-08 NOTE — H&P (Signed)
 History and Physical    Patient: Kathleen Figueroa FMW:[id number] DOB: Oct 12, 1935 DOA: 01/08/2024 DOS: the patient was seen and examined on 01/08/2024 PCP: Katina Pfeiffer, PA-C  Patient coming from: Home  Chief Complaint:  Chief Complaint  Patient presents with   Code Stroke   HPI: Kathleen Figueroa is a 88 y.o. 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  Pt unable to provide medical history due to keppra/ativan load. From what I can gather per Epic review, pt was at her rehabilitation facility and was noted to have some twitching of the left arm today around 12:30pm. Per daughter, pt complained left arm some tingling at that time but then the arm started with small twitching and then became more significant shaking.  Afterwards, she had increased weakness on the left side.  Code stroke activated. Left arm twitching was seen on arrival to the emergency department, and 2 mg of lorazepam was administered with cessation of seizure activity. Pt also was sedated later.  In the ED, pt hypertensive, lethargic and somnolent. Labs unremarkable. MRI brain w/o acute infarct, but did note advanced chronic ischemic disease. Evolution of the large right PCA infarct since January, including laminar necrosis now in the medial right occipital lobe. EDP administered keppra/ativan load, consulted Neurology, and requested medicine admisison.   Review of Systems: As mentioned in the history of present illness. All other systems reviewed and are negative. 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)    CVA (cerebral vascular accident) (HCC) 11/12/2022   Diabetes mellitus type 2, uncontrolled, 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   H/O detached retina repair    Hypertension    TIA (transient ischemic attack)    Past Surgical History:  Procedure Laterality Date   ENDARTERECTOMY Left 01/19/2014   Procedure: ENDARTERECTOMY CAROTID-LEFT;  Surgeon: Gaile LELON New, MD;  Location: [facility] OR;  Service: Vascular;  Laterality: Left;   EYE SURGERY     lens removed from right eye   PATCH ANGIOPLASTY Left 01/19/2014   Procedure: PATCH ANGIOPLASTY, LEFT CAROTID ARTERY USING HEMASHIELD PLATINUM FINESSE PATCH;  Surgeon: Gaile LELON New, MD;  Location: [facility] OR;  Service: Vascular;  Laterality: Left;   TONSILLECTOMY     Social History:  reports that she quit smoking about 45 years ago. Her smoking use included cigarettes. She has never used smokeless tobacco. She reports that she does not drink alcohol  and does not use drugs.  Allergies  Allergen Reactions   Aspirin  Nausea Only    Higher dose    Family History  Problem Relation Age of Onset   Hypertension Mother    Diabetes Mother    Hypertension Father     Prior to Admission medications   Medication Sig Start Date End Date Taking? Authorizing Provider  acetaminophen  (TYLENOL ) 325 MG tablet Take 650 mg by mouth every 6 (six) hours as needed.    [provider]  amLODipine  (NORVASC ) 10 MG tablet Take 1 tablet (10 mg total) by mouth daily. 11/13/22  Ghimire, Donalda HERO, MD  aspirin  EC 81 MG tablet Take 1 tablet (81 mg total) by mouth daily. Swallow whole. 02/19/23   Hongalgi, Anand D, MD  atorvastatin  (LIPITOR ) 80 MG tablet Take 1 tablet (80 mg total) by mouth daily. 02/19/23   Hongalgi, Anand D, MD  clopidogrel  (PLAVIX ) 75 MG tablet Take 1 tablet (75 mg total) by mouth daily. 07/20/23   Onita Duos, MD  feeding supplement (BOOST HIGH PROTEIN) LIQD  Take 1 Container by mouth 3 (three) times daily between meals.    [provider]  gabapentin  (NEURONTIN ) 100 MG capsule Take 2 capsules (200 mg total) by mouth 3 (three) times daily. 03/15/23   Setzer, Sandra J, PA-C  lidocaine  (HM LIDOCAINE  PATCH) 4 % Place 1 patch onto the skin daily.    [provider]  polyethylene glycol (MIRALAX  / GLYCOLAX ) 17 g packet Take 17 g by mouth 2 (two) times daily. 03/15/23   Rosendo Nena PARAS, PA-C    Physical Exam: Vitals:   01/08/24 1413 01/08/24 1422 01/08/24 1555 01/08/24 1628  BP:   (!) 145/92   Pulse:   81   Resp:   16   Temp:   (!) 97.4 F (36.3 C)   TempSrc:   Oral   SpO2: (!) 89% 95% 98%   Weight:    69.7 kg  Height:    5' 5 (1.651 m)   General: Lethargic and somnolent; unable to ascertain orientation s/p keppra/ativan load; resting comfortably in no acute respiratory distress Respiratory: Lungs clear to auscultation bilaterally with normal respiratory effort; no w/r/r Cardiovascular: Regular rate and rhythm w/o m/r/g Abdomen: Soft, nontender, nondistended. Positive bowel sounds   Data Reviewed:  Lab Results  Component Value Date   WBC 5.4 01/08/2024   HGB 15.0 01/08/2024   HCT 44.0 01/08/2024   MCV 84.6 01/08/2024   PLT 229 01/08/2024   Lab Results  Component Value Date   GLUCOSE 108 (H) 01/08/2024   CALCIUM  9.0 01/08/2024   NA 139 01/08/2024   K 4.2 01/08/2024   CO2 25 01/08/2024   CL 102 01/08/2024   BUN 30 (H) 01/08/2024   CREATININE 1.10 (H) 01/08/2024   Lab Results  Component Value Date   ALT 10 01/08/2024   AST 26 01/08/2024   ALKPHOS 83 01/08/2024   BILITOT 0.7 01/08/2024   Lab Results  Component Value Date   INR 1.0 01/08/2024   INR 1.0 04/03/2023   INR 1.0 02/11/2023   Radiology: MR BRAIN WO CONTRAST Result Date: 01/08/2024 EXAM: MRI BRAIN WITHOUT CONTRAST 01/08/2024 02:41:48 PM TECHNIQUE: Multiplanar multisequence MRI of the head/brain was performed without the administration of  intravenous contrast. COMPARISON: Previous brain MRI 02/11/2023. Head CT and CTA head and neck earlier today. CLINICAL HISTORY: 88 year old female. Code Stroke presentation today. Neuro deficit, acute, stroke suspected. FINDINGS: BRAIN AND VENTRICLES: No intracranial hemorrhage. No mass. No midline shift. No hydrocephalus. The sella is unremarkable. Normal flow voids. Previous right PCA territory infarct with evolution since 02/11/2023, predominantly encephalomalacia. Conspicuous laminar necrosis along the medial affected right occipital lobe (series 3 image 12 and series 4 image 12) with persistent trace diffusion abnormality there also. No convincing acute area of diffusion restriction. Chronic microhemorrhages in the brain including scattered in the bilateral thalami, in the left pons. A left temporal lobe chronic microhemorrhage is new since 02/11/2023 on series 7 image 37. Hemosiderin associated with the chronic right PCA infarct is mild. Superimposed extensive chronic lacunar infarcts in the bilateral deep gray  nuclei and the brainstem. Chronic infarcts and encephalomalacia also in the posterior left MCA and left MCA / PCA watershed area. Advanced chronic additional cerebral white matter disease. Only mild chronic small vessel disease in the cerebellum. ORBITS: Resolved rightward gaze deviation. Chronic postoperative changes to the globes. SINUSES AND MASTOIDS: No acute abnormality. BONES AND SOFT TISSUES: Normal marrow signal. Negative for age visible cervical spine. No acute soft tissue abnormality. IMPRESSION: 1. No convincing acute intracranial abnormality. 2. Advanced chronic ischemic disease. Evolution of the large right PCA infarct since January, including laminar necrosis now in the medial right occipital lobe. Advanced chronic small vessel disease, with mildly progressed chronic microhemorrhage in the brain since January. Electronically signed by: Helayne Hurst MD 01/08/2024 03:43 PM EST RP Workstation:  [id number]   CT ANGIO HEAD NECK W WO CM (CODE STROKE) Result Date: 01/08/2024 EXAM: CTA HEAD AND NECK WITHOUT AND WITH 01/08/2024 02:10:14 PM TECHNIQUE: CTA of the head and neck was performed without and with the administration of 75 mL of intravenous iohexol  (OMNIPAQUE ) 350 MG/ML injection. Multiplanar 2D and/or 3D reformatted images are provided for review. Automated exposure control, iterative reconstruction, and/or weight based adjustment of the mA/kV was utilized to reduce the radiation dose to as low as reasonably achievable. Stenosis of the internal carotid arteries measured using NASCET criteria. COMPARISON: CTA head and neck 02/11/2023 CLINICAL HISTORY: Neuro deficit, acute, stroke suspected. FINDINGS: CTA NECK: AORTIC ARCH AND ARCH VESSELS: The aortic arch was incompletely imaged, including exclusion of the arch vessel origins. The brachiocephalic and subclavian arteries are patent. Streak artifact from venous contrast obscures portions of the proximal left subclavian artery. CERVICAL CAROTID ARTERIES: Moderate amount of mixed plaque about the right carotid bifurcation without evidence of a significant stenosis of the common carotid artery or cervical ICA. Unchanged moderate stenosis of the proximal right ECA. CERVICAL VERTEBRAL ARTERIES: Both vertebral arteries are patent in the neck. Calcified plaque at the left vertebral artery origin results in unchanged severe stenosis. LUNGS AND MEDIASTINUM: Mild centrilobular emphysema. SOFT TISSUES: No acute abnormality. 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 Advanced multilevel cervical disc and facet degeneration. CTA HEAD: ANTERIOR CIRCULATION: The intracranial internal carotid arteries are patent with unchanged mild left cavernous and paraclinoid stenoses. The left A1 segment is hypoplastic. ACAs and MCAs are patent with scattered moderate branch vessel stenoses but no evidence of a proximal branch occlusion or flow-limiting A1 or M1 stenosis. No  aneurysm. POSTERIOR CIRCULATION: Occlusion of the right V4 segment is unchanged. The intracranial left vertebral artery is patent with unchanged multifocal stenoses including a severe stenosis at the vertebrobasilar junction. The basilar artery is patent with mild to moderate diffuse irregular narrowing. There are large right and small left posterior communicating arteries with hypoplasia of the right P1 segment. Right P2 occlusion is unchanged. The left PCA is patent with a severe P2 stenosis again noted. No aneurysm. OTHER: No dural venous sinus thrombosis on this non-dedicated study. IMPRESSION: 1. No emergent large vessel occlusion. 2. 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 3. Intracranial atherosclerosis as detailed above, including unchanged severe stenoses of the left vertebrobasilar junction and left P2 segment. 4. Mild left intracranial ICA stenoses. 5. These results were communicated to Dr. Jerri at 2:10 PM on 02/11/2023 by secure text page via the [facility] messaging system. Electronically signed by: Dasie Hamburg MD 01/08/2024 02:29 PM EST RP Workstation: [id number]   CT HEAD CODE STROKE WO CONTRAST Result Date: 01/08/2024 EXAM: CT HEAD WITHOUT CONTRAST 01/08/2024  01:48:24 PM TECHNIQUE: CT of the head was performed without the administration of intravenous contrast. Automated exposure control, iterative reconstruction, and/or weight based adjustment of the mA/kV was utilized to reduce the radiation dose to as low as reasonably achievable. COMPARISON: Head CT 06/28/2023 and MRI 02/11/2023. CLINICAL HISTORY: Neuro deficit, acute, stroke suspected. FINDINGS: BRAIN AND VENTRICLES: There is no evidence of an acute infarct, intracranial hemorrhage, mass, midline shift, hydrocephalus, or extra-axial fluid collection. Moderately large chronic right PCA and posterior left MCA territory infarcts are again noted with ex vacuo dilatation of the right greater than left lateral  ventricles. Hypodensities elsewhere in the cerebral white matter bilaterally are similar to the prior CT and nonspecific but compatible with severe chronic small vessel ischemic disease. Chronic lacunar infarcts are again noted in the deep gray nuclei and pons. Calcified atherosclerosis at the skull base. ORBITS: Bilateral cataract extraction. Right scleral buckle. SINUSES: No acute abnormality. SOFT TISSUES AND SKULL: No acute soft tissue abnormality. No skull fracture. [place] Stroke Program Early CT Score (ASPECTS) ----- Ganglionic (caudate, IC, lentiform nucleus, insula, M1-M3): 7 Supraganglionic (M4-M6): 3 Total: 10 IMPRESSION: 1. No acute intracranial abnormality. ASPECTS of 10. 2. Severe chronic small vessel ischemic disease. 3. Chronic right PCA and left MCA infarcts. 4. These results were communicated to Dr. Jerri at 2:10 PM on 01/08/2024 by secure text page via the [facility] messaging system. Electronically signed by: Dasie Hamburg MD 01/08/2024 02:11 PM EST RP Workstation: [id number]    Assessment and Plan: 89F h/o HTN, DM2, CKD, left MCA CVA s/p left carotid endarterectomy, and most recent admission for right PCA CVA on 02/11/2023 for which pt required IP rehab given left hemiparesis p/w confusion and L arm heaviness c/f stroke vs seizure.  Acute encephalopathy Seizure H/o L MCA stroke H/o R PCA stroke -Neurology following; apprec eval/recs -Agree with 1g Keppra load, Keppra 500mg  BID, and LTM/EEG per Neuro recs -HOLD pta ASA 81mg  daily and Plavix  75mg  daily while NPO  HTN -HOLD pta amlodipine  10mg  daily while NPO  HLD -HOLD pta atorvastatin  80mg  daily while NPO   Advance Care Planning:   Code Status: Full Code   Consults: Neurology  Family Communication: Daughter  Severity of Illness: The appropriate patient status for this patient is INPATIENT. Inpatient status is judged to be reasonable and necessary in order to provide the required intensity of service to ensure the patient's safety.  The patient's presenting symptoms, physical exam findings, and initial radiographic and laboratory data in the context of their chronic comorbidities is felt to place them at high risk for further clinical deterioration. Furthermore, it is not anticipated that the patient will be medically stable for discharge from the hospital within 2 midnights of admission.   * I certify that at the point of admission it is my clinical judgment that the patient will require inpatient hospital care spanning beyond 2 midnights from the point of admission due to high intensity of service, high risk for further deterioration and high frequency of surveillance required.*   ------- I spent 55 minutes reviewing previous notes, at the bedside counseling/discussing the treatment plan, and performing clinical documentation.  Author: Marsha Ada, MD 01/08/2024 5:13 PM  For on call review [email or web address].

## 2024-01-08 NOTE — ED Triage Notes (Signed)
 Pt LKW 1230 today, deficit of left arm weakness with no effort against gravity. Initial NIH 11. Hx of previous stroke but deficit much worse today. Pt having twitching of left arm on arrival, given 2mg  of Ativan  for seizure like activity. AOX4.

## 2024-01-08 NOTE — ED Notes (Signed)
 Pt placed on 2L NC

## 2024-01-08 NOTE — ED Provider Notes (Signed)
 [place] EMERGENCY DEPARTMENT AT [facility] Provider Note   CSN: [id number] Arrival date & time: 01/08/24  1336  An emergency department physician performed an initial assessment on this suspected stroke patient at 79.  Patient presents with: Code Stroke   Kathleen Figueroa is a 88 y.o. female.   Patient brought to the emergency department for evaluation by EMS.  Patient reports she began having weakness in her left arm and her left leg today at 12:30 PM.  Patient has a past medical history of a CVA.  Patient was evaluated at triage by the [facility] team.  Patient has decreased strength in her left arm and her left leg.  Patient was reported to be using her arm and leg normal yesterday.  Patient is not having any difficulty breathing or speaking.  Patient has a past medical history of diabetes hypertension hyperlipidemia carotid artery disease previous CVA, chronic kidney disease, left cerebral artery thrombosis, left sided hemiparesis and ataxia.  The history is provided by the patient and the EMS personnel. No language interpreter was used.       Prior to Admission medications   Medication Sig Start Date End Date Taking? Authorizing Provider  acetaminophen  (TYLENOL ) 325 MG tablet Take 650 mg by mouth every 6 (six) hours as needed.    [provider]  amLODipine  (NORVASC ) 10 MG tablet Take 1 tablet (10 mg total) by mouth daily. 11/13/22   Ghimire, Donalda HERO, MD  aspirin  EC 81 MG tablet Take 1 tablet (81 mg total) by mouth daily. Swallow whole. 02/19/23   Hongalgi, Anand D, MD  atorvastatin  (LIPITOR ) 80 MG tablet Take 1 tablet (80 mg total) by mouth daily. 02/19/23   Hongalgi, Anand D, MD  clopidogrel  (PLAVIX ) 75 MG tablet Take 1 tablet (75 mg total) by mouth daily. 07/20/23   Onita Duos, MD  feeding supplement (BOOST HIGH PROTEIN) LIQD Take 1 Container by mouth 3 (three) times daily between meals.    [provider]  gabapentin  (NEURONTIN ) 100 MG capsule  Take 2 capsules (200 mg total) by mouth 3 (three) times daily. 03/15/23   Setzer, Sandra J, PA-C  lidocaine  (HM LIDOCAINE  PATCH) 4 % Place 1 patch onto the skin daily.    [provider]  polyethylene glycol (MIRALAX  / GLYCOLAX ) 17 g packet Take 17 g by mouth 2 (two) times daily. 03/15/23   Setzer, Nena PARAS, PA-C    Allergies: Aspirin     Review of Systems  Neurological:  Positive for weakness and numbness.  All other systems reviewed and are negative.   Updated Vital Signs BP (!) 172/83   Pulse 80   Wt 72.2 kg   SpO2 95%   BMI 26.49 kg/m   Physical Exam Vitals and nursing note reviewed.  Constitutional:      Appearance: She is well-developed.  HENT:     Head: Normocephalic.     Right Ear: External ear normal.     Left Ear: External ear normal.     Nose: Nose normal.     Mouth/Throat:     Mouth: Mucous membranes are moist.  Cardiovascular:     Rate and Rhythm: Normal rate.  Pulmonary:     Effort: Pulmonary effort is normal.  Abdominal:     General: There is no distension.  Musculoskeletal:     Cervical back: Normal range of motion.     Comments: Decreased range of motion left arm and left leg.  Patient unable to lift her left arm she  is unable to hold left leg up.  Skin:    General: Skin is warm.  Neurological:     General: No focal deficit present.     Mental Status: She is alert and oriented to person, place, and time.     (all labs ordered are listed, but only abnormal results are displayed) Labs Reviewed  CBC - Abnormal; Notable for the following components:      Result Value   RBC 5.18 (*)    MCH 25.9 (*)    RDW 15.9 (*)    All other components within normal limits  I-STAT CHEM 8, ED - Abnormal; Notable for the following components:   BUN 30 (*)    Creatinine, Ser 1.10 (*)    Glucose, Bld 108 (*)    All other components within normal limits  PROTIME-INR  APTT  DIFFERENTIAL  COMPREHENSIVE METABOLIC PANEL WITH GFR  ETHANOL  CBG MONITORING, ED     EKG: None  Radiology: CT ANGIO HEAD NECK W WO CM (CODE STROKE) Result Date: 01/08/2024 EXAM: CTA HEAD AND NECK WITHOUT AND WITH 01/08/2024 02:10:14 PM TECHNIQUE: CTA of the head and neck was performed without and with the administration of 75 mL of intravenous iohexol  (OMNIPAQUE ) 350 MG/ML injection. Multiplanar 2D and/or 3D reformatted images are provided for review. Automated exposure control, iterative reconstruction, and/or weight based adjustment of the mA/kV was utilized to reduce the radiation dose to as low as reasonably achievable. Stenosis of the internal carotid arteries measured using NASCET criteria. COMPARISON: CTA head and neck 02/11/2023 CLINICAL HISTORY: Neuro deficit, acute, stroke suspected. FINDINGS: CTA NECK: AORTIC ARCH AND ARCH VESSELS: The aortic arch was incompletely imaged, including exclusion of the arch vessel origins. The brachiocephalic and subclavian arteries are patent. Streak artifact from venous contrast obscures portions of the proximal left subclavian artery. CERVICAL CAROTID ARTERIES: Moderate amount of mixed plaque about the right carotid bifurcation without evidence of a significant stenosis of the common carotid artery or cervical ICA. Unchanged moderate stenosis of the proximal right ECA. CERVICAL VERTEBRAL ARTERIES: Both vertebral arteries are patent in the neck. Calcified plaque at the left vertebral artery origin results in unchanged severe stenosis. LUNGS AND MEDIASTINUM: Mild centrilobular emphysema. SOFT TISSUES: No acute abnormality. 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 Advanced multilevel cervical disc and facet degeneration. CTA HEAD: ANTERIOR CIRCULATION: The intracranial internal carotid arteries are patent with unchanged mild left cavernous and paraclinoid stenoses. The left A1 segment is hypoplastic. ACAs and MCAs are patent with scattered moderate branch vessel stenoses but no evidence of a proximal branch occlusion or flow-limiting A1 or  M1 stenosis. No aneurysm. POSTERIOR CIRCULATION: Occlusion of the right V4 segment is unchanged. The intracranial left vertebral artery is patent with unchanged multifocal stenoses including a severe stenosis at the vertebrobasilar junction. The basilar artery is patent with mild to moderate diffuse irregular narrowing. There are large right and small left posterior communicating arteries with hypoplasia of the right P1 segment. Right P2 occlusion is unchanged. The left PCA is patent with a severe P2 stenosis again noted. No aneurysm. OTHER: No dural venous sinus thrombosis on this non-dedicated study. IMPRESSION: 1. No emergent large vessel occlusion. 2. 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 3. Intracranial atherosclerosis as detailed above, including unchanged severe stenoses of the left vertebrobasilar junction and left P2 segment. 4. Mild left intracranial ICA stenoses. 5. These results were communicated to Dr. Jerri at 2:10 PM on 02/11/2023 by secure text page via  the [employer] system. Electronically signed by: Dasie Hamburg MD 01/08/2024 02:29 PM EST RP Workstation: [id number]   CT HEAD CODE STROKE WO CONTRAST Result Date: 01/08/2024 EXAM: CT HEAD WITHOUT CONTRAST 01/08/2024 01:48:24 PM TECHNIQUE: CT of the head was performed without the administration of intravenous contrast. Automated exposure control, iterative reconstruction, and/or weight based adjustment of the mA/kV was utilized to reduce the radiation dose to as low as reasonably achievable. COMPARISON: Head CT 06/28/2023 and MRI 02/11/2023. CLINICAL HISTORY: Neuro deficit, acute, stroke suspected. FINDINGS: BRAIN AND VENTRICLES: There is no evidence of an acute infarct, intracranial hemorrhage, mass, midline shift, hydrocephalus, or extra-axial fluid collection. Moderately large chronic right PCA and posterior left MCA territory infarcts are again noted with ex vacuo dilatation of the right greater than  left lateral ventricles. Hypodensities elsewhere in the cerebral white matter bilaterally are similar to the prior CT and nonspecific but compatible with severe chronic small vessel ischemic disease. Chronic lacunar infarcts are again noted in the deep gray nuclei and pons. Calcified atherosclerosis at the skull base. ORBITS: Bilateral cataract extraction. Right scleral buckle. SINUSES: No acute abnormality. SOFT TISSUES AND SKULL: No acute soft tissue abnormality. No skull fracture. [place] Stroke Program Early CT Score (ASPECTS) ----- Ganglionic (caudate, IC, lentiform nucleus, insula, M1-M3): 7 Supraganglionic (M4-M6): 3 Total: 10 IMPRESSION: 1. No acute intracranial abnormality. ASPECTS of 10. 2. Severe chronic small vessel ischemic disease. 3. Chronic right PCA and left MCA infarcts. 4. These results were communicated to Dr. Jerri at 2:10 PM on 01/08/2024 by secure text page via the [facility] messaging system. Electronically signed by: Dasie Hamburg MD 01/08/2024 02:11 PM EST RP Workstation: [id number]     Procedures   Medications Ordered in the ED  sodium chloride  flush (NS) 0.9 % injection 3 mL (has no administration in time range)  levETIRAcetam (KEPPRA) undiluted injection 500 mg (has no administration in time range)  iohexol  (OMNIPAQUE ) 350 MG/ML injection 75 mL (75 mLs Intravenous Contrast Given 01/08/24 1409)  LORazepam (ATIVAN) injection 2 mg (2 mg Intravenous Given 01/08/24 1340)                                    Medical Decision Making Patient complains of left arm and left leg weakness that started at 1230 today  Amount and/or Complexity of Data Reviewed Independent Historian: EMS    Details: Patient is brought in by EMS.  Patient is here with family who reports patient was using her arm yesterday. External Data Reviewed: notes.    Details: Previous notes reviewed Labs: ordered. Decision-making details documented in ED Course.    Details: Labs ordered reviewed and interpreted.   Patient has normal white blood cell count BUN is 30 creatinine is 1.10. Radiology: ordered and independent interpretation performed. Decision-making details documented in ED Course.    Details: CT head shows no acute CVA CT angio head and neck shows chronic occlusion of the intracranial right vertebral artery and right P2 PCA. ECG/medicine tests: ordered and independent interpretation performed. Decision-making details documented in ED Course. Discussion of management or test interpretation with external provider(s): Patient seen by stroke neurology Dr. Jerri.  He advised that symptoms are most likely from Todd's paralysis and seizure.  He will set patient up for EEG.  He advised to have patient admitted to hospitalist for ongoing care. I spoke with Dr. Georgina hospitalist who will admit the patient for further evaluation.  Final diagnoses:  Left-sided weakness  Todd's paralysis (postepileptic) [facility])    ED Discharge Orders     None          Flint Sonny POUR, PA-C 01/08/24 1459    Gennaro Bouchard L, DO 01/09/24 [phone]

## 2024-01-08 NOTE — Progress Notes (Signed)
 Routine EEG completed, results pending Neurology review and interpretation

## 2024-01-09 DIAGNOSIS — R569 Unspecified convulsions: Secondary | ICD-10-CM | POA: Diagnosis not present

## 2024-01-09 LAB — GLUCOSE, CAPILLARY
Glucose-Capillary: 99 mg/dL (ref 70–99)
Glucose-Capillary: 83 mg/dL (ref 70–99)
Glucose-Capillary: 90 mg/dL (ref 70–99)
Glucose-Capillary: 115 mg/dL — ABNORMAL HIGH (ref 70–99)

## 2024-01-09 NOTE — Hospital Course (Addendum)
 Kathleen Figueroa is a 88 y.o. 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  Patient was unable to provide information due to Keppra/Ativan load. Code stroke activated. Left arm twitching was seen on arrival to the emergency department, and 2 mg of lorazepam was administered with cessation of seizure activity. In the ED, blood pressure was elevated.  Patient was lethargic and somnolent.  Labs were unremarkable.  MRI of the brain brain without acute infarct.  There was evidence of evolution of the large right PCA infarct since January, including laminar necrosis now in the medial right occipital lobe.   Acute encephalopathy Seizure History of  stroke. Neurology following.  Patient received 1g Keppra load, Keppra 500mg  BID, and LTM/EEG monitoring.  Currently aspirin  and Plavix  on hold due to n.p.o. status.  HTN On amlodipine  at home.  Currently n.p.o.   Hyperlipidemia On Lipitor  a TEE prior to coming to the hospital.  Currently n.p.o.

## 2024-01-09 NOTE — Progress Notes (Addendum)
 PROGRESS NOTE  Kathleen Figueroa FMW:[id number] DOB: Aug 13, 1935 DOA: 01/08/2024 PCP: Katina Pfeiffer, PA-C   LOS: 1 day   Brief narrative:   Kathleen Figueroa is a 88 y.o. 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  Patient was unable to provide information due to Keppra/Ativan load. Code stroke activated. Left arm twitching was seen on arrival to the emergency department, and 2 mg of lorazepam was administered with cessation of seizure activity. In the ED, blood pressure was elevated.  Patient was lethargic and somnolent.  Labs were unremarkable.  MRI of the brain brain without acute infarct.  There was evidence of evolution of the large right PCA infarct since January, including laminar necrosis now in the medial right occipital lobe.    Assessment/Plan: Principal Problem:   Seizure (HCC)  Acute metabolic encephalopathy Question Seizure History of  stroke. Neurology following.  Patient received 1g Keppra load, Keppra 500mg  BID.  EEG showed normal activity.  Currently aspirin  and Plavix  on hold due to n.p.o. status.  Check speech therapy evaluation.  Check PT OT.  Essential Hypertension On amlodipine  at home.  Currently n.p.o. will continue to monitor closely.  Blood pressure seems to be stable.  Will add IV as needed hydralazine  if needed.   Hyperlipidemia On Lipitor  prior to coming to the hospital.  Currently n.p.o. await further speech therapy evaluation  DVT prophylaxis: enoxaparin  (LOVENOX ) injection 40 mg Start: 01/08/24 1500   Disposition: Uncertain at this time  Status is: Inpatient Remains inpatient appropriate because: Pending  clinical improvement    Code Status:     Code Status: Full Code  Family Communication: None at bedside. I spoke with the patient's daughter and updated her about the clinical condition of the patient.   Consultants: Neurology  Procedures: EEG  Anti-infectives:  None  Anti-infectives (From admission, onward)    None       Subjective: Today, patient was seen and examined at bedside.  Patient alert awake oriented x 1.  Not a good historian.  Denies pain.  Objective: Vitals:   01/09/24 0414 01/09/24 0841  BP: 127/60 (P) 135/68  Pulse: (!) 58 (P) 62  Resp: 18 (P) 18  Temp: 98.7 F (37.1 C) (P) 98 F (36.7 C)  SpO2: 97% (P) 100%    Intake/Output Summary (Last 24 hours) at 01/09/2024 9077 Last data filed at 01/09/2024 0400 Gross per 24 hour  Intake 10 ml  Output 300 ml  Net -290 ml   Filed Weights   01/08/24 1300 01/08/24 1628  Weight: 72.2 kg 69.7 kg   Body mass index is 25.57 kg/m.   Physical Exam: GENERAL: Patient is alert awake and oriented to self and place disoriented to time.  Not in obvious distress.  Elderly female. HENT: No scleral pallor or icterus. Pupils equally reactive to light. Oral mucosa is moist NECK: is supple, no gross swelling noted. CHEST: Clear to auscultation. No crackles or wheezes.  CVS: S1 and S2 heard, no murmur. Regular rate and rhythm.  ABDOMEN: Soft, non-tender, bowel sounds are present. EXTREMITIES: No edema.  On mittens. CNS: Alert awake and oriented to self and place.SABRA  Speech is distinct.  Moving extremities SKIN: warm and dry without  rashes.  Data Review: I have personally reviewed the following laboratory data and studies,  CBC: Recent Labs  Lab 01/08/24 1338 01/08/24 1348  WBC 5.4  --   NEUTROABS 2.7  --   HGB 13.4 15.0  HCT 43.8 44.0  MCV 84.6  --   PLT 229  --    Basic Metabolic Panel: Recent Labs  Lab 01/08/24 1338 01/08/24 1348  NA 138 139  K 4.3 4.2  CL 102 102  CO2 25  --   GLUCOSE 112* 108*   BUN 23 30*  CREATININE 1.00 1.10*  CALCIUM  9.0  --    Liver Function Tests: Recent Labs  Lab 01/08/24 1338  AST 26  ALT 10  ALKPHOS 83  BILITOT 0.7  PROT 6.8  ALBUMIN 3.5   No results for input(s): LIPASE, AMYLASE in the last 168 hours. No results for input(s): AMMONIA in the last 168 hours. Cardiac Enzymes: No results for input(s): CKTOTAL, CKMB, CKMBINDEX, TROPONINI in the last 168 hours. BNP (last 3 results) No results for input(s): BNP in the last 8760 hours.  ProBNP (last 3 results) No results for input(s): PROBNP in the last 8760 hours.  CBG: Recent Labs  Lab 01/08/24 1600 01/08/24 2131 01/09/24 0615  GLUCAP 104* 84 99   No results found for this or any previous visit (from the past 240 hours).   Studies: MR BRAIN WO CONTRAST Result Date: 01/08/2024 EXAM: MRI BRAIN WITHOUT CONTRAST 01/08/2024 02:41:48 PM TECHNIQUE: Multiplanar multisequence MRI of the head/brain was performed without the administration of intravenous contrast. COMPARISON: Previous brain MRI 02/11/2023. Head CT and CTA head and neck earlier today. CLINICAL HISTORY: 88 year old female. Code Stroke presentation today. Neuro deficit, acute, stroke suspected. FINDINGS: BRAIN AND VENTRICLES: No intracranial hemorrhage. No mass. No midline shift. No hydrocephalus. The sella is unremarkable. Normal flow voids. Previous right PCA territory infarct with evolution since 02/11/2023, predominantly encephalomalacia. Conspicuous laminar necrosis along the medial affected right occipital lobe (series 3 image 12 and series 4 image 12) with persistent trace diffusion abnormality there also. No convincing acute area of diffusion restriction. Chronic microhemorrhages in the brain including scattered in the bilateral thalami, in the left pons. A left temporal lobe chronic microhemorrhage is new since 02/11/2023 on series 7 image 37. Hemosiderin associated with the chronic right PCA infarct is mild.  Superimposed extensive chronic lacunar infarcts in the bilateral deep gray nuclei and the brainstem. Chronic infarcts and encephalomalacia also in the posterior left MCA and left MCA / PCA watershed area. Advanced chronic additional cerebral white matter disease. Only mild chronic small vessel disease in the cerebellum. ORBITS: Resolved rightward gaze deviation. Chronic postoperative changes to the globes. SINUSES AND MASTOIDS: No acute abnormality. BONES AND SOFT TISSUES: Normal marrow signal. Negative for age visible cervical spine. No acute soft tissue abnormality. IMPRESSION: 1. No convincing acute intracranial abnormality. 2. Advanced chronic ischemic disease. Evolution of the large right PCA infarct since January, including laminar necrosis now in the medial right occipital lobe. Advanced chronic small vessel disease, with mildly progressed chronic microhemorrhage in the brain since January. Electronically signed by: Helayne Hurst MD 01/08/2024 03:43 PM EST RP Workstation: [id number]   CT ANGIO HEAD NECK W WO CM (CODE STROKE) Result Date: 01/08/2024 EXAM: CTA HEAD AND NECK WITHOUT AND WITH 01/08/2024 02:10:14 PM TECHNIQUE: CTA of the head and neck was performed without and with the administration of 75 mL of intravenous iohexol  (OMNIPAQUE ) 350 MG/ML injection. Multiplanar 2D and/or 3D reformatted images are provided for  review. Automated exposure control, iterative reconstruction, and/or weight based adjustment of the mA/kV was utilized to reduce the radiation dose to as low as reasonably achievable. Stenosis of the internal carotid arteries measured using NASCET criteria. COMPARISON: CTA head and neck 02/11/2023 CLINICAL HISTORY: Neuro deficit, acute, stroke suspected. FINDINGS: CTA NECK: AORTIC ARCH AND ARCH VESSELS: The aortic arch was incompletely imaged, including exclusion of the arch vessel origins. The brachiocephalic and subclavian arteries are patent. Streak artifact from venous contrast obscures  portions of the proximal left subclavian artery. CERVICAL CAROTID ARTERIES: Moderate amount of mixed plaque about the right carotid bifurcation without evidence of a significant stenosis of the common carotid artery or cervical ICA. Unchanged moderate stenosis of the proximal right ECA. CERVICAL VERTEBRAL ARTERIES: Both vertebral arteries are patent in the neck. Calcified plaque at the left vertebral artery origin results in unchanged severe stenosis. LUNGS AND MEDIASTINUM: Mild centrilobular emphysema. SOFT TISSUES: No acute abnormality. 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 Advanced multilevel cervical disc and facet degeneration. CTA HEAD: ANTERIOR CIRCULATION: The intracranial internal carotid arteries are patent with unchanged mild left cavernous and paraclinoid stenoses. The left A1 segment is hypoplastic. ACAs and MCAs are patent with scattered moderate branch vessel stenoses but no evidence of a proximal branch occlusion or flow-limiting A1 or M1 stenosis. No aneurysm. POSTERIOR CIRCULATION: Occlusion of the right V4 segment is unchanged. The intracranial left vertebral artery is patent with unchanged multifocal stenoses including a severe stenosis at the vertebrobasilar junction. The basilar artery is patent with mild to moderate diffuse irregular narrowing. There are large right and small left posterior communicating arteries with hypoplasia of the right P1 segment. Right P2 occlusion is unchanged. The left PCA is patent with a severe P2 stenosis again noted. No aneurysm. OTHER: No dural venous sinus thrombosis on this non-dedicated study. IMPRESSION: 1. No emergent large vessel occlusion. 2. 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 3. Intracranial atherosclerosis as detailed above, including unchanged severe stenoses of the left vertebrobasilar junction and left P2 segment. 4. Mild left intracranial ICA stenoses. 5. These results were communicated to Dr. Jerri  at 2:10 PM on 02/11/2023 by secure text page via the [facility] messaging system. Electronically signed by: Dasie Hamburg MD 01/08/2024 02:29 PM EST RP Workstation: [id number]   CT HEAD CODE STROKE WO CONTRAST Result Date: 01/08/2024 EXAM: CT HEAD WITHOUT CONTRAST 01/08/2024 01:48:24 PM TECHNIQUE: CT of the head was performed without the administration of intravenous contrast. Automated exposure control, iterative reconstruction, and/or weight based adjustment of the mA/kV was utilized to reduce the radiation dose to as low as reasonably achievable. COMPARISON: Head CT 06/28/2023 and MRI 02/11/2023. CLINICAL HISTORY: Neuro deficit, acute, stroke suspected. FINDINGS: BRAIN AND VENTRICLES: There is no evidence of an acute infarct, intracranial hemorrhage, mass, midline shift, hydrocephalus, or extra-axial fluid collection. Moderately large chronic right PCA and posterior left MCA territory infarcts are again noted with ex vacuo dilatation of the right greater than left lateral ventricles. Hypodensities elsewhere in the cerebral white matter bilaterally are similar to the prior CT and nonspecific but compatible with severe chronic small vessel ischemic disease. Chronic lacunar infarcts are again noted in the deep gray nuclei and pons. Calcified atherosclerosis at the skull base. ORBITS: Bilateral cataract extraction. Right scleral buckle. SINUSES: No acute abnormality. SOFT TISSUES AND SKULL: No acute soft tissue abnormality. No skull fracture. [place] Stroke Program Early CT Score (ASPECTS) ----- Ganglionic (caudate, IC, lentiform nucleus, insula, M1-M3): 7 Supraganglionic (M4-M6): 3 Total:  10 IMPRESSION: 1. No acute intracranial abnormality. ASPECTS of 10. 2. Severe chronic small vessel ischemic disease. 3. Chronic right PCA and left MCA infarcts. 4. These results were communicated to Dr. Jerri at 2:10 PM on 01/08/2024 by secure text page via the [facility] messaging system. Electronically signed by: Dasie Hamburg MD 01/08/2024  02:11 PM EST RP Workstation: [id number]      Vernal Alstrom, MD  [facility] 01/09/2024  If 7PM-7AM, please contact night-coverage

## 2024-01-09 NOTE — Evaluation (Signed)
 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)    CVA (cerebral vascular accident) (HCC) 11/12/2022   Diabetes mellitus type 2, uncontrolled, 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   H/O detached retina repair    Hypertension    TIA (transient ischemic attack)    Past Surgical History:  Past Surgical History:  Procedure Laterality Date   ENDARTERECTOMY Left 01/19/2014   Procedure: ENDARTERECTOMY CAROTID-LEFT;  Surgeon: Gaile LELON New, MD;  Location[facility] OR;  Service: Vascular;  Laterality: Left;   EYE SURGERY     lens removed from right eye   PATCH ANGIOPLASTY Left 01/19/2014   Procedure: PATCH ANGIOPLASTY, LEFT CAROTID ARTERY USING HEMASHIELD PLATINUM FINESSE PATCH;  Surgeon: Gaile LELON New, MD;  Location: [facility] OR;  Service: Vascular;  Laterality: Left;   TONSILLECTOMY     HPI:  88 y.o. female who presented 01/08/24 with confusion and L arm heaviness. MRI brain negative for acute changes, EEG negative for seizures. Dx Acute metabolic encephalopathy. PMH: HTN, DM2, CKD, left MCA CVA s/p left carotid endarterectomy, and most recent admission for right PCA CVA on 02/11/2023; pt participated in IP rehab given left hemiparesis thereafter. MBS 03/08/23: trace to mild oral dysphagia and pharyngeal swallowing function that is grossly WFL. Dysphagia 3/thin recommended. Pt discharged from inpatient rehab at [facility] for use of swallowing  precautions and at supervision level of cognitive tasks.    Assessment / Plan / Recommendation  Clinical Impression  Pt was seen for bedside swallow evaluation and she denied a recent history of dysphagia. Pt recalled the MBS that was completed in January, but stated that she now eats regular foods and swallows pills whole with liquids without difficulty. Oral mechanism exam was grossly WFL. Dentition was limited with maxillary dentures and six anterior, mandibular teeth. She tolerated all solids and liquids without signs or symptoms of oropharyngeal dysphagia. A regular texture diet with thin liquids is recommended at this time; considering pt's history, SLP will see pt once more to ensure tolerance. SLP Visit Diagnosis: Dysphagia, unspecified (R13.10)    Aspiration Risk  No limitations    Diet Recommendation Regular;Thin liquid    Liquid Administration via: Cup;Straw Medication Administration: Whole meds with liquid Supervision: Staff to assist with self feeding Postural Changes: Seated upright at 90 degrees    Other  Recommendations Oral Care Recommendations: Oral care BID     Assistance Recommended at Discharge    Functional Status Assessment Patient has not had a recent decline in their functional status  Frequency and Duration min 1 x/week  1 week       Prognosis Prognosis for improved oropharyngeal function: Good      Swallow Study   General Date of Onset: 01/08/24 HPI: 88 y.o. female who presented 01/08/24 with confusion and L arm heaviness. MRI brain negative for acute changes, EEG negative for seizures. Dx Acute metabolic encephalopathy. PMH: HTN, DM2, CKD, left MCA CVA s/p left carotid endarterectomy, and most  recent admission for right PCA CVA on 02/11/2023; pt participated in IP rehab given left hemiparesis thereafter. MBS 03/08/23: trace to mild oral dysphagia and pharyngeal swallowing function that is grossly WFL. Dysphagia 3/thin recommended. Pt discharged from inpatient  rehab at [facility] for use of swallowing precautions and at supervision level of cognitive tasks. Type of Study: Bedside Swallow Evaluation Previous Swallow Assessment: See HPI Diet Prior to this Study: NPO (pending swallow eval) Temperature Spikes Noted: No Respiratory Status: Room air History of Recent Intubation: No Behavior/Cognition: Alert;Cooperative;Pleasant mood Oral Cavity Assessment: Within Functional Limits Oral Care Completed by SLP: No Oral Cavity - Dentition: Dentures, top;Missing dentition Vision: Functional for self-feeding Self-Feeding Abilities: Needs assist Patient Positioning: Upright in bed;Postural control adequate for testing Baseline Vocal Quality: Normal Volitional Cough: Strong Volitional Swallow: Able to elicit    Oral/Motor/Sensory Function Overall Oral Motor/Sensory Function: Within functional limits   Ice Chips Ice chips: Within functional limits   Thin Liquid Thin Liquid: Within functional limits Presentation: Straw    Nectar Thick Nectar Thick Liquid: Not tested   Honey Thick Honey Thick Liquid: Not tested   Puree Puree: Within functional limits Presentation: Spoon   Solid     Solid: Within functional limits     Kathleen Nydam I. [place], MS, CCC-SLP Acute Rehabilitation Services Office number [phone]  Kathleen Figueroa 01/09/2024,12:07 PM

## 2024-01-09 NOTE — Plan of Care (Signed)
 Pt is alert, but still drowsy though she respond to few questions and follows command.  0400 RN tried to initiate swallow screen but pt is still drowsy. Problem: Education: Goal: Knowledge of General Education information will improve Description: Including pain rating scale, medication(s)/side effects and non-pharmacologic comfort measures Outcome: Progressing   Problem: Health Behavior/Discharge Planning: Goal: Ability to manage health-related needs will improve Outcome: Progressing   Problem: Clinical Measurements: Goal: Will remain free from infection Outcome: Progressing   Problem: Activity: Goal: Risk for activity intolerance will decrease Outcome: Progressing   Problem: Nutrition: Goal: Adequate nutrition will be maintained Outcome: Progressing   Problem: Safety: Goal: Ability to remain free from injury will improve Outcome: Progressing   Problem: Skin Integrity: Goal: Risk for impaired skin integrity will decrease Outcome: Progressing

## 2024-01-09 NOTE — Plan of Care (Signed)
 EEG is negative for seizures.  Continue Keppra 500 twice daily.

## 2024-01-10 DIAGNOSIS — R569 Unspecified convulsions: Secondary | ICD-10-CM | POA: Diagnosis not present

## 2024-01-10 LAB — CBC
HCT: 38.6 % (ref 36.0–46.0)
Hemoglobin: 12.2 g/dL (ref 12.0–15.0)
MCH: 25.8 pg — ABNORMAL LOW (ref 26.0–34.0)
MCHC: 31.6 g/dL (ref 30.0–36.0)
MCV: 81.8 fL (ref 80.0–100.0)
Platelets: 230 K/uL (ref 150–400)
RBC: 4.72 MIL/uL (ref 3.87–5.11)
RDW: 15.5 % (ref 11.5–15.5)
WBC: 5.2 K/uL (ref 4.0–10.5)
nRBC: 0 % (ref 0.0–0.2)

## 2024-01-10 LAB — BASIC METABOLIC PANEL WITH GFR
Anion gap: 10 (ref 5–15)
BUN: 25 mg/dL — ABNORMAL HIGH (ref 8–23)
CO2: 25 mmol/L (ref 22–32)
Calcium: 8.7 mg/dL — ABNORMAL LOW (ref 8.9–10.3)
Chloride: 106 mmol/L (ref 98–111)
Creatinine, Ser: 1.21 mg/dL — ABNORMAL HIGH (ref 0.44–1.00)
GFR, Estimated: 44 mL/min — ABNORMAL LOW (ref >60)
Glucose, Bld: 77 mg/dL (ref 70–99)
Potassium: 3.8 mmol/L (ref 3.5–5.1)
Sodium: 141 mmol/L (ref 135–145)

## 2024-01-10 LAB — GLUCOSE, CAPILLARY: Glucose-Capillary: 87 mg/dL (ref 70–99)

## 2024-01-10 LAB — MAGNESIUM: Magnesium: 1.7 mg/dL (ref 1.7–2.4)

## 2024-01-10 NOTE — Evaluation (Signed)
 Physical Therapy Evaluation Patient Details Name: Kathleen Figueroa MRN: [id number] DOB: Feb 09, 1936 Today's Date: 01/10/2024  History of Present Illness  88 y.o. female who initially presented to the [facility] ED for evaluation of dizziness, numbness and tingling of the right side of her face. MRI revealed an punctate acute infarct of the R temporal lobe. past medical history of hypertension, diabetes, carotid stenosis s/p left carotid endarterectomy and TIAs  Clinical Impression  PTA, pt living at [facility], where she states she pivots to w/c for mobilization and typically requires assist for ADLs from staff. Currently, pt is A&Ox4, but demonstrates difficulty following one step commands during testing and mobility which seemed to inc as session went on. She requires supervision for bed mobility, with VC to square hips up to EOB and to maintain sitting balance at midline. Requiring maxA +2 for stand pivot transfer to chair, demonstrating poor initiation into STS. She states she has a L gaze deficit, and noted dec peripheral vision to her L. Demonstrates great difficulty following commands to track, with upward and R gaze noted in both supine and sitting. Only able to track to R once, and unable to track to L. Finally, noted dec coordination in attempts to feed self, with pt overshooting by about 1 foot to her R side. Recommend returning to ALF upon d/c with follow up therapy. Acute PT to follow.       If plan is discharge home, recommend the following: Two people to help with walking and/or transfers;A lot of help with bathing/dressing/bathroom;Assistance with cooking/housework;Assistance with feeding;Direct supervision/assist for medications management;Direct supervision/assist for financial management;Assist for transportation;Supervision due to cognitive status   Can travel by private vehicle        Equipment Recommendations None recommended by PT  Recommendations for Other  Services  OT consult    Functional Status Assessment Patient has had a recent decline in their functional status and demonstrates the ability to make significant improvements in function in a reasonable and predictable amount of time.     Precautions / Restrictions Precautions Precautions: Fall Recall of Precautions/Restrictions: Intact Restrictions Weight Bearing Restrictions Per Provider Order: No      Mobility  Bed Mobility Overal bed mobility: Needs Assistance Bed Mobility: Supine to Sit     Supine to sit: Supervision     General bed mobility comments: No physical assist needed, just inc time and verbal cueing for squaring hips to bed in sitting    Transfers Overall transfer level: Needs assistance Equipment used: None Transfers: Sit to/from Stand, Bed to chair/wheelchair/BSC Sit to Stand: Max assist, +2 physical assistance Stand pivot transfers: Max assist, +2 physical assistance         General transfer comment: Stand pivot to pt's L side, which she states is how she normally transfers to her w/c. Demonstrated poor initiation to come into standing, and required maxA +2 to complete. Described L knee pain during transfer that improved upon reclining chair    Ambulation/Gait                  Stairs            Wheelchair Mobility     Tilt Bed    Modified Rankin (Stroke Patients Only) Modified Rankin (Stroke Patients Only) Pre-Morbid Rankin Score: Moderately severe disability Modified Rankin: Moderately severe disability     Balance Overall balance assessment: Needs assistance Sitting-balance support: Single extremity supported, Feet supported, No upper extremity supported Sitting balance-Leahy Scale: Fair Sitting balance -  Comments: Occasionally requiring unilateral extremity support to stay upright, but able to correct with VC   Standing balance support: Reliant on assistive device for balance, Bilateral upper extremity supported, During  functional activity Standing balance-Leahy Scale: Zero                               Pertinent Vitals/Pain Pain Assessment Pain Assessment: Faces Faces Pain Scale: Hurts a little bit Pain Location: L knee Pain Descriptors / Indicators: Grimacing, Guarding Pain Intervention(s): Limited activity within patient's tolerance, Monitored during session, Repositioned    Home Living Family/patient expects to be discharged to:: Assisted living                   Additional Comments: Pt reports coming from [facility].    Prior Function Prior Level of Function : Needs assist             Mobility Comments: States she normally mobilizes using w/c, and pivots to and from w/c to bed and toilet with little to no assist. States she has not walked since October, she thinks.       Extremity/Trunk Assessment   Upper Extremity Assessment Upper Extremity Assessment: Defer to OT evaluation    Lower Extremity Assessment Lower Extremity Assessment: RLE deficits/detail;LLE deficits/detail RLE Deficits / Details: Pt with difficulty following commands to formally assess LE strength. Appears 5/5 grossly for knee extension, knee flexion, ankle PF, ankle DF. Noted bony enlargement of B knees, but not tender to palpation LLE Deficits / Details: Pt with difficulty following commands to formally assess LE strength. Appears 5/5 grossly for knee extension, knee flexion, ankle PF, ankle DF. Noted bony enlargement of B knees, but not tender to palpation    Cervical / Trunk Assessment Cervical / Trunk Assessment: Normal  Communication   Communication Communication: Impaired Factors Affecting Communication: Reduced clarity of speech    Cognition Arousal: Alert Behavior During Therapy: Flat affect   PT - Cognitive impairments: Awareness, Attention, Initiation, Sequencing, Safety/Judgement                       PT - Cognition Comments: Pt A&Ox4. Had difficulty following  one step commands which seemed to inc as session went on regarding special testing and mobility. Demosntrates poor initiation with STS despite gestural and verbal cueing. Following commands: Impaired Following commands impaired: Follows one step commands inconsistently, Follows one step commands with increased time     Cueing Cueing Techniques: Verbal cues, Tactile cues, Gestural cues     General Comments General comments (skin integrity, edema, etc.): Noted upward and R gaze preference during conversation with patient. Able to state that she has a visual deficit to her L. Only once able to track finger to R, unable to track finger to L. Despite verbal and gestural cueing during visual assessment, pt with upward gaze preference that she could correct with inc effort. Noted possible central gaze deficit, as pt unable to see food tray in front of her and was reaching with L hand approximately 1 foot off target to her R side.    Exercises     Assessment/Plan    PT Assessment Patient needs continued PT services  PT Problem List Decreased strength;Decreased range of motion;Decreased activity tolerance;Decreased balance;Decreased mobility;Decreased coordination;Decreased cognition;Decreased knowledge of use of DME;Decreased safety awareness       PT Treatment Interventions DME instruction;Gait training;Functional mobility training;Therapeutic activities;Therapeutic exercise;Balance training;Neuromuscular re-education;Cognitive  remediation;Patient/family education;Wheelchair mobility training;Manual techniques;Modalities    PT Goals (Current goals can be found in the Care Plan section)  Acute Rehab PT Goals Patient Stated Goal: to go back to ALF PT Goal Formulation: With patient Time For Goal Achievement: 01/24/24 Potential to Achieve Goals: Fair    Frequency Min 2X/week     Co-evaluation               AM-PAC PT 6 Clicks Mobility  Outcome Measure Help needed turning from your  back to your side while in a flat bed without using bedrails?: A Little Help needed moving from lying on your back to sitting on the side of a flat bed without using bedrails?: A Little Help needed moving to and from a bed to a chair (including a wheelchair)?: Total Help needed standing up from a chair using your arms (e.g., wheelchair or bedside chair)?: Total Help needed to walk in hospital room?: Total Help needed climbing 3-5 steps with a railing? : Total 6 Click Score: 10    End of Session Equipment Utilized During Treatment: Gait belt Activity Tolerance: Patient tolerated treatment well Patient left: in chair;with call bell/phone within reach;with chair alarm set Nurse Communication: Mobility status PT Visit Diagnosis: Unsteadiness on feet (R26.81);Other abnormalities of gait and mobility (R26.89);Difficulty in walking, not elsewhere classified (R26.2)    Time: 9162-9097 PT Time Calculation (min) (ACUTE ONLY): 25 min   Charges:                 Damian Hofstra, SPT   Veralyn Lopp 01/10/2024, 9:59 AM

## 2024-01-10 NOTE — NC FL2 (Signed)
   MEDICAID [id number] LEVEL OF CARE FORM     IDENTIFICATION  Patient Name: Kathleen Figueroa Birthdate: May 20, 1935 Sex: female Admission Date (Current Location): 01/08/2024  [facility] and [place] Number:  Producer, Television/film/video and Address:  [facility] [place], 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   Left homonymous hemianopsia 04/16/2023   Hemi-neglect of left side 04/16/2023   Alteration of sensation as late effect of stroke 04/16/2023   Left hemiparesis (HCC) 04/16/2023   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   Stage 2 chronic kidney disease 02/20/2023   Diabetes mellitus (HCC) 02/20/2023   Primary hypertension 02/20/2023   CVA (cerebral vascular accident) (HCC) 02/18/2023   History of CVA (cerebrovascular accident) 02/11/2023   Acute CVA (cerebrovascular accident) (HCC) 02/11/2023   Hypokalemia 11/12/2022   S/P carotid endarterectomy 05/28/2014   Carotid artery disease 01/14/2014   Essential hypertension    Hyperlipidemia 01/12/2014   Diabetes mellitus without complication (HCC)     Orientation RESPIRATION BLADDER Height & Weight     Self, Time, Place  Normal Incontinent Weight: 153 lb 10.6 oz (69.7 kg) Height:  5' 5 (165.1 cm)  BEHAVIORAL SYMPTOMS/MOOD NEUROLOGICAL BOWEL NUTRITION STATUS      Incontinent Diet (regular)  AMBULATORY STATUS COMMUNICATION OF NEEDS Skin   Limited Assist Verbally Normal                       Personal Care Assistance Level of  Assistance  Bathing, Feeding, Dressing Bathing Assistance: Limited assistance Feeding assistance: Limited assistance Dressing Assistance: Limited assistance     Functional Limitations Info  Speech     Speech Info: Impaired (dysarthria)    SPECIAL CARE FACTORS FREQUENCY  PT (By licensed PT), OT (By licensed OT)     PT Frequency: 3x/wk OT Frequency: 3x/wk            Contractures Contractures Info: Not present    Additional Factors Info  Code Status, Allergies Code Status Info: Full Allergies Info: Aspirin , Metformin              Discharge Medications: STOP taking these medications     gabapentin  100 MG capsule Commonly known as: NEURONTIN     HM Lidocaine  Patch 4 % Generic drug: lidocaine            TAKE these medications     acetaminophen  325 MG tablet Commonly known as: TYLENOL  Take 650 mg by mouth every 6 (six) hours as needed for mild pain (pain score 1-3) or moderate pain (pain score 4-6).    amLODipine  10 MG tablet Commonly known as: NORVASC  Take 1 tablet (10 mg total) by mouth daily.    aspirin  EC 81 MG tablet Take 1 tablet (81 mg total) by mouth daily. Swallow whole.    atorvastatin  80 MG tablet Commonly known as: LIPITOR  Take 1 tablet (80 mg total) by mouth daily.    clopidogrel  75 MG tablet Commonly known as: PLAVIX  Take 1 tablet (75  mg total) by mouth daily.    diphenhydrAMINE  25 mg capsule Commonly known as: BENADRYL  Take 25 mg by mouth every 6 (six) hours as needed for itching, sleep or allergies.    feeding supplement Liqd Take 1 Container by mouth 3 (three) times daily between meals.    multivitamin with minerals Tabs tablet Take 1 tablet by mouth daily.    polyethylene glycol 17 g packet Commonly known as: MIRALAX  / GLYCOLAX  Take 17 g by mouth 2 (two) times daily.    Relevant Imaging Results:  Relevant Lab Results:   Additional Information SS#: [id number]  Kathleen CHRISTELLA Goodie, LCSW

## 2024-01-10 NOTE — Discharge Summary (Addendum)
 Physician Discharge Summary  Kathleen Figueroa FMW:[id number] DOB: 03-12-1935 DOA: 01/08/2024  PCP: Katina Pfeiffer, PA-C  Admit date: 01/08/2024 Discharge date: 01/10/2024  Admitted From: Assisted living facility  Discharge disposition: Assisted living facility   Recommendations for Outpatient Follow-Up:   Follow up with your primary care provider in one week.  Check CBC, BMP, magnesium  in the next visit   Discharge Diagnosis:   Seizure-like activity   Discharge Condition: Improved.  Diet recommendation: Regular  Wound care: None.  Code status: Full.   History of Present Illness:   Kathleen Figueroa is a 88 y.o. 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  Patient was unable to provide information due to Keppra/Ativan load. Code stroke activated. Left arm twitching was seen on arrival to the emergency department, and 2 mg of lorazepam was administered with cessation of seizure activity. In the ED, blood pressure was elevated.  Patient was lethargic and somnolent.  Labs were unremarkable.  MRI of the brain brain without acute infarct.  There was evidence of evolution of the large right PCA infarct since January, including laminar necrosis now in the medial right occipital lobe.    Hospital Course:   Following conditions were addressed during hospitalization as listed below,  Acute toxic encephalopathy-resolved Seizure-like activity History of  stroke. Neurology followed the patient during hospitalization and initially received 1g Keppra load, Keppra 500mg  BID.  Patient received sedation and was confused as per the family.  EEG  showed normal activity.  Had a prolonged discussion with the patient's daughter who is aware of her similar twitching episodes otherwise and does not believe that it was related to seizures.  Patient did not lose any consciousness at all when she had those episodes and at this time family does not feel like she would need any antiepileptic agents.  Clinically on further history taking, looks like it could be involuntary movement from her left-sided weakness.  Will hold off with Keppra.  Seizure has been ruled out at this time.   Essential Hypertension On amlodipine  at home.  Will continue on discharge.  CKD stage IIIa.  At baseline at this time.  Hyperlipidemia Resume Lipitor  from home  Disposition.  At this time, patient is stable for disposition to her assisted living facility. I also spoke with the patient's daughter prior to disposition.  Medical Consultants:   Neurology  Procedures:    EEG Subjective:   Today, patient was seen and examined at bedside.  Sitting up in the chair.  Denies any headache, dizziness, lightheadedness, shortness of breath dyspnea.  Discharge Exam:   Vitals:   01/10/24 0442 01/10/24 0758  BP: (!) 153/64 (!) 143/59  Pulse: 63 63  Resp: 16 16  Temp: 98.4 F (36.9 C) 98.2 F (36.8 C)  SpO2: 95% 96%   Vitals:   01/09/24 1931 01/09/24 2307 01/10/24 0442 01/10/24 0758  BP: (!) 129/58 132/60 (!) 153/64 (!) 143/59  Pulse: 66 60 63 63  Resp: 18 16 16 16   Temp: 98.9 F (37.2 C) 98.4 F (36.9 C) 98.4 F (36.9 C) 98.2 F (36.8 C)  TempSrc: Oral Oral Oral Oral  SpO2: 96% 94% 95% 96%  Weight:  Height:       Body mass index is 25.57 kg/m.  General: Alert awake, not in obvious distress, Communicative, HENT: pupils equally reacting to light,  No scleral pallor or icterus noted. Oral mucosa is moist.  Chest:  Clear breath sounds. No crackles or wheezes.  CVS: S1 &S2 heard. No murmur.  Regular rate and rhythm. Abdomen: Soft, nontender,  nondistended.  Bowel sounds are heard.   Extremities: No cyanosis, clubbing or edema.  Peripheral pulses are palpable. Psych: Alert, awake and oriented to place and time,, normal mood CNS:  No cranial nerve deficits.  Left-sided weakness mostly on the upper extremity noted at baseline. Skin: Warm and dry.  No rashes noted.  The results of significant diagnostics from this hospitalization (including imaging, microbiology, ancillary and laboratory) are listed below for reference.     Diagnostic Studies:   MR BRAIN WO CONTRAST Result Date: 01/08/2024 EXAM: MRI BRAIN WITHOUT CONTRAST 01/08/2024 02:41:48 PM TECHNIQUE: Multiplanar multisequence MRI of the head/brain was performed without the administration of intravenous contrast. COMPARISON: Previous brain MRI 02/11/2023. Head CT and CTA head and neck earlier today. CLINICAL HISTORY: 88 year old female. Code Stroke presentation today. Neuro deficit, acute, stroke suspected. FINDINGS: BRAIN AND VENTRICLES: No intracranial hemorrhage. No mass. No midline shift. No hydrocephalus. The sella is unremarkable. Normal flow voids. Previous right PCA territory infarct with evolution since 02/11/2023, predominantly encephalomalacia. Conspicuous laminar necrosis along the medial affected right occipital lobe (series 3 image 12 and series 4 image 12) with persistent trace diffusion abnormality there also. No convincing acute area of diffusion restriction. Chronic microhemorrhages in the brain including scattered in the bilateral thalami, in the left pons. A left temporal lobe chronic microhemorrhage is new since 02/11/2023 on series 7 image 37. Hemosiderin associated with the chronic right PCA infarct is mild. Superimposed extensive chronic lacunar infarcts in the bilateral deep gray nuclei and the brainstem. Chronic infarcts and encephalomalacia also in the posterior left MCA and left MCA / PCA watershed area. Advanced chronic additional cerebral white matter disease.  Only mild chronic small vessel disease in the cerebellum. ORBITS: Resolved rightward gaze deviation. Chronic postoperative changes to the globes. SINUSES AND MASTOIDS: No acute abnormality. BONES AND SOFT TISSUES: Normal marrow signal. Negative for age visible cervical spine. No acute soft tissue abnormality. IMPRESSION: 1. No convincing acute intracranial abnormality. 2. Advanced chronic ischemic disease. Evolution of the large right PCA infarct since January, including laminar necrosis now in the medial right occipital lobe. Advanced chronic small vessel disease, with mildly progressed chronic microhemorrhage in the brain since January. Electronically signed by: Helayne Hurst MD 01/08/2024 03:43 PM EST RP Workstation: [id number]   CT ANGIO HEAD NECK W WO CM (CODE STROKE) Result Date: 01/08/2024 EXAM: CTA HEAD AND NECK WITHOUT AND WITH 01/08/2024 02:10:14 PM TECHNIQUE: CTA of the head and neck was performed without and with the administration of 75 mL of intravenous iohexol  (OMNIPAQUE ) 350 MG/ML injection. Multiplanar 2D and/or 3D reformatted images are provided for review. Automated exposure control, iterative reconstruction, and/or weight based adjustment of the mA/kV was utilized to reduce the radiation dose to as low as reasonably achievable. Stenosis of the internal carotid arteries measured using NASCET criteria. COMPARISON: CTA head and neck 02/11/2023 CLINICAL HISTORY: Neuro deficit, acute, stroke suspected. FINDINGS: CTA NECK: AORTIC ARCH AND ARCH VESSELS: The aortic arch was incompletely imaged, including exclusion of the arch vessel origins. The brachiocephalic and subclavian arteries are patent. Streak artifact from venous contrast obscures portions of the proximal left subclavian artery.  CERVICAL CAROTID ARTERIES: Moderate amount of mixed plaque about the right carotid bifurcation without evidence of a significant stenosis of the common carotid artery or cervical ICA. Unchanged moderate stenosis of  the proximal right ECA. CERVICAL VERTEBRAL ARTERIES: Both vertebral arteries are patent in the neck. Calcified plaque at the left vertebral artery origin results in unchanged severe stenosis. LUNGS AND MEDIASTINUM: Mild centrilobular emphysema. SOFT TISSUES: No acute abnormality. 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 Advanced multilevel cervical disc and facet degeneration. CTA HEAD: ANTERIOR CIRCULATION: The intracranial internal carotid arteries are patent with unchanged mild left cavernous and paraclinoid stenoses. The left A1 segment is hypoplastic. ACAs and MCAs are patent with scattered moderate branch vessel stenoses but no evidence of a proximal branch occlusion or flow-limiting A1 or M1 stenosis. No aneurysm. POSTERIOR CIRCULATION: Occlusion of the right V4 segment is unchanged. The intracranial left vertebral artery is patent with unchanged multifocal stenoses including a severe stenosis at the vertebrobasilar junction. The basilar artery is patent with mild to moderate diffuse irregular narrowing. There are large right and small left posterior communicating arteries with hypoplasia of the right P1 segment. Right P2 occlusion is unchanged. The left PCA is patent with a severe P2 stenosis again noted. No aneurysm. OTHER: No dural venous sinus thrombosis on this non-dedicated study. IMPRESSION: 1. No emergent large vessel occlusion. 2. 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 3. Intracranial atherosclerosis as detailed above, including unchanged severe stenoses of the left vertebrobasilar junction and left P2 segment. 4. Mild left intracranial ICA stenoses. 5. These results were communicated to Dr. Jerri at 2:10 PM on 02/11/2023 by secure text page via the [facility] messaging system. Electronically signed by: Dasie Hamburg MD 01/08/2024 02:29 PM EST RP Workstation: [id number]   CT HEAD CODE STROKE WO CONTRAST Result Date: 01/08/2024 EXAM: CT HEAD WITHOUT CONTRAST  01/08/2024 01:48:24 PM TECHNIQUE: CT of the head was performed without the administration of intravenous contrast. Automated exposure control, iterative reconstruction, and/or weight based adjustment of the mA/kV was utilized to reduce the radiation dose to as low as reasonably achievable. COMPARISON: Head CT 06/28/2023 and MRI 02/11/2023. CLINICAL HISTORY: Neuro deficit, acute, stroke suspected. FINDINGS: BRAIN AND VENTRICLES: There is no evidence of an acute infarct, intracranial hemorrhage, mass, midline shift, hydrocephalus, or extra-axial fluid collection. Moderately large chronic right PCA and posterior left MCA territory infarcts are again noted with ex vacuo dilatation of the right greater than left lateral ventricles. Hypodensities elsewhere in the cerebral white matter bilaterally are similar to the prior CT and nonspecific but compatible with severe chronic small vessel ischemic disease. Chronic lacunar infarcts are again noted in the deep gray nuclei and pons. Calcified atherosclerosis at the skull base. ORBITS: Bilateral cataract extraction. Right scleral buckle. SINUSES: No acute abnormality. SOFT TISSUES AND SKULL: No acute soft tissue abnormality. No skull fracture. [place] Stroke Program Early CT Score (ASPECTS) ----- Ganglionic (caudate, IC, lentiform nucleus, insula, M1-M3): 7 Supraganglionic (M4-M6): 3 Total: 10 IMPRESSION: 1. No acute intracranial abnormality. ASPECTS of 10. 2. Severe chronic small vessel ischemic disease. 3. Chronic right PCA and left MCA infarcts. 4. These results were communicated to Dr. Jerri at 2:10 PM on 01/08/2024 by secure text page via the [facility] messaging system. Electronically signed by: Dasie Hamburg MD 01/08/2024 02:11 PM EST RP Workstation: [id number]     Labs:   Basic Metabolic Panel: Recent Labs  Lab 01/08/24 1338 01/08/24 1348 01/10/24 0156  NA 138 139 141  K 4.3 4.2 3.8  CL 102 102 106  CO2 25  --  25  GLUCOSE 112* 108* 77  BUN 23 30* 25*   CREATININE 1.00 1.10* 1.21*  CALCIUM  9.0  --  8.7*  MG  --   --  1.7   GFR Estimated Creatinine Clearance: 32.7 mL/min (A) (by C-G formula based on SCr of 1.21 mg/dL (H)). Liver Function Tests: Recent Labs  Lab 01/08/24 1338  AST 26  ALT 10  ALKPHOS 83  BILITOT 0.7  PROT 6.8  ALBUMIN 3.5   No results for input(s): LIPASE, AMYLASE in the last 168 hours. No results for input(s): AMMONIA in the last 168 hours. Coagulation profile Recent Labs  Lab 01/08/24 1338  INR 1.0    CBC: Recent Labs  Lab 01/08/24 1338 01/08/24 1348 01/10/24 0156  WBC 5.4  --  5.2  NEUTROABS 2.7  --   --   HGB 13.4 15.0 12.2  HCT 43.8 44.0 38.6  MCV 84.6  --  81.8  PLT 229  --  230   Cardiac Enzymes: No results for input(s): CKTOTAL, CKMB, CKMBINDEX, TROPONINI in the last 168 hours. BNP: Invalid input(s): POCBNP CBG: Recent Labs  Lab 01/09/24 0615 01/09/24 1118 01/09/24 1630 01/09/24 2115 01/10/24 0604  GLUCAP 99 83 90 115* 87   D-Dimer No results for input(s): DDIMER in the last 72 hours. Hgb A1c No results for input(s): HGBA1C in the last 72 hours. Lipid Profile No results for input(s): CHOL, HDL, LDLCALC, TRIG, CHOLHDL, LDLDIRECT in the last 72 hours. Thyroid  function studies No results for input(s): TSH, T4TOTAL, T3FREE, THYROIDAB in the last 72 hours.  Invalid input(s): FREET3 Anemia work up No results for input(s): VITAMINB12, FOLATE, FERRITIN, TIBC, IRON, RETICCTPCT in the last 72 hours. Microbiology No results found for this or any previous visit (from the past 240 hours).   Discharge Instructions:   Discharge Instructions     Diet general   Complete by: As directed    Discharge instructions   Complete by: As directed    Follow-up with your primary care provider in 1 week.  Check blood work at that time.  Seek medical attention for worsening symptoms.   Increase activity slowly   Complete by: As directed        Allergies as of 01/10/2024       Reactions   Aspirin  Nausea Only, Other (See Comments)   Higher dose = nausea   Metformin  Other (See Comments)   Stomach upset        Medication List     STOP taking these medications    gabapentin  100 MG capsule Commonly known as: NEURONTIN    HM Lidocaine  Patch 4 % Generic drug: lidocaine        TAKE these medications    acetaminophen  325 MG tablet Commonly known as: TYLENOL  Take 650 mg by mouth every 6 (six) hours as needed for mild pain (pain score 1-3) or moderate pain (pain score 4-6).   amLODipine  10 MG tablet Commonly known as: NORVASC  Take 1 tablet (10 mg total) by mouth daily.   aspirin  EC 81 MG tablet Take 1 tablet (81 mg total) by mouth daily. Swallow whole.   atorvastatin  80 MG tablet Commonly known as: LIPITOR  Take 1 tablet (80 mg total) by mouth daily.   clopidogrel  75 MG tablet Commonly known as: PLAVIX  Take 1 tablet (75 mg total) by mouth daily.   diphenhydrAMINE  25 mg capsule Commonly known as: BENADRYL  Take 25 mg by mouth every 6 (six) hours  as needed for itching, sleep or allergies.   feeding supplement Liqd Take 1 Container by mouth 3 (three) times daily between meals.   multivitamin with minerals Tabs tablet Take 1 tablet by mouth daily.   polyethylene glycol 17 g packet Commonly known as: MIRALAX  / GLYCOLAX  Take 17 g by mouth 2 (two) times daily.        Follow-up Information     Katina Pfeiffer, PA-C Follow up in 1 week(s).   Specialty: Family Medicine Contact information: [address] [place] [address] [phone]                  Time coordinating discharge: 39 minutes  Signed:  Medardo Hassing  [facility] 01/10/2024, 9:57 AM

## 2024-01-10 NOTE — Progress Notes (Signed)
 [facility] arrived to get patient. IV and TELE removed. CCMD notified of discharge. Pt belongings given to [facility].

## 2024-01-10 NOTE — Plan of Care (Signed)
  Problem: Health Behavior/Discharge Planning: Goal: Ability to manage health-related needs will improve Outcome: Progressing   Problem: Clinical Measurements: Goal: Will remain free from infection Outcome: Progressing   Problem: Clinical Measurements: Goal: Will remain free from infection Outcome: Progressing   Problem: Activity: Goal: Risk for activity intolerance will decrease Outcome: Progressing   Problem: Nutrition: Goal: Adequate nutrition will be maintained Outcome: Progressing   Problem: Coping: Goal: Level of anxiety will decrease Outcome: Progressing   Problem: Safety: Goal: Ability to remain free from injury will improve Outcome: Progressing   Problem: Skin Integrity: Goal: Risk for impaired skin integrity will decrease Outcome: Progressing

## 2024-01-10 NOTE — Evaluation (Signed)
 Occupational Therapy Evaluation Patient Details Name: Kathleen Figueroa MRN: [id number] DOB: [birth year] Today's Date: 01/10/2024   History of Present Illness   88 y.o. female who initially presented to the [facility] drawbridge ED for evaluation of dizziness, numbness and tingling of the right side of her face. MRI revealed an punctate acute infarct of the R temporal lobe. past medical history of hypertension, diabetes, carotid stenosis s/p left carotid endarterectomy and TIAs     Clinical Impressions PTA, pt from [facility] and reports receiving light assist from staff for SPT transfers to her wheelchair as well as assist with ADLs of dressing and bathing. Unsure accuracy of report, as pt needing max A to transfer today and reports she transferred the same way she always does and without much assist. Pt currently requires +2 assist for OOB mobility and min-total A for ADL. Pt additionally with decreased self implementation of compensatory techniques for L hemianopsia from prior CVA. Plan for dc back to ALF today. Would recommend continued follow up in natural environment.      If plan is discharge home, recommend the following:   Two people to help with walking and/or transfers;A lot of help with bathing/dressing/bathroom;Assistance with cooking/housework;Assist for transportation;Help with stairs or ramp for entrance     Functional Status Assessment   Patient has had a recent decline in their functional status and demonstrates the ability to make significant improvements in function in a reasonable and predictable amount of time.     Equipment Recommendations   None recommended by OT     Recommendations for Other Services         Precautions/Restrictions   Precautions Precautions: Fall Recall of Precautions/Restrictions: Intact Restrictions Weight Bearing Restrictions Per Provider Order: No     Mobility Bed Mobility                     Transfers Overall transfer level: Needs assistance Equipment used: None Transfers: Sit to/from Stand, Bed to chair/wheelchair/BSC Sit to Stand: Max assist, +2 physical assistance Stand pivot transfers: Max assist, +2 physical assistance         General transfer comment: Stand pivot to pt's L side, which she states is how she normally transfers to her w/c. Demonstrated poor initiation to come into standing, and required maxA +2 to complete. Described L knee pain during transfer that improved upon reclining chair      Balance Overall balance assessment: Needs assistance Sitting-balance support: Single extremity supported, Feet supported, No upper extremity supported Sitting balance-Leahy Scale: Fair Sitting balance - Comments: Occasionally requiring unilateral extremity support to stay upright, but able to correct with VC   Standing balance support: Reliant on assistive device for balance, Bilateral upper extremity supported, During functional activity Standing balance-Leahy Scale: Zero                             ADL either performed or assessed with clinical judgement   ADL Overall ADL's : Needs assistance/impaired Eating/Feeding: Set up;Sitting Eating/Feeding Details (indicate cue type and reason): pt reports difficulty opening packages and putting butter on her toast                     Toilet Transfer: Maximal assistance;+2 for safety/equipment;+2 for physical assistance;Stand-pivot           Functional mobility during ADLs: Maximal assistance;+2 for physical assistance;+2 for safety/equipment       Vision Baseline Vision/History:  (  field cut at baseline) Ability to See in Adequate Light: 2 Moderately impaired Patient Visual Report: No change from baseline;Peripheral vision impairment Vision Assessment?: Yes Eye Alignment: Within Functional Limits (however, appears to have difficulty at times finding OT face/sustaining eye contact with OT at  midline.) Tracking/Visual Pursuits: Requires cues, head turns, or add eye shifts to track (head turn to track on the L secondary to field cut) Visual Fields: Left homonymous hemianopsia Additional Comments: suspect baseline vision since CVA. pt aware overall of compensatory techniques but poor functional implementation (self implementation of compenstaory head turn when looking for items on tray)     Perception Perception: Impaired Preception Impairment Details: Inattention/Neglect Perception-Other Comments: L inattention/visual field cut.   Praxis         Pertinent Vitals/Pain Pain Assessment Pain Assessment: Faces Faces Pain Scale: Hurts a little bit Pain Location: L knee Pain Descriptors / Indicators: Grimacing, Guarding Pain Intervention(s): Limited activity within patient's tolerance, Monitored during session     Extremity/Trunk Assessment Upper Extremity Assessment Upper Extremity Assessment: LUE deficits/detail;RUE deficits/detail RUE Deficits / Details: generalized weakness LUE Deficits / Details: decr initiation, arm hanging off edge of chair and pt with L lateral lean as a result. on command, pt uses LUE, able to oppose thumb to all digits, 3 to 3+/5 strength grossly and FM.   Lower Extremity Assessment Lower Extremity Assessment: Defer to PT evaluation RLE Deficits / Details: Pt with difficulty following commands to formally assess LE strength. Appears 5/5 grossly for knee extension, knee flexion, ankle PF, ankle DF. Noted bony enlargement of B knees, but not tender to palpation LLE Deficits / Details: Pt with difficulty following commands to formally assess LE strength. Appears 5/5 grossly for knee extension, knee flexion, ankle PF, ankle DF. Noted bony enlargement of B knees, but not tender to palpation   Cervical / Trunk Assessment Cervical / Trunk Assessment: Normal   Communication Communication Communication: Impaired Factors Affecting Communication: Reduced  clarity of speech   Cognition Arousal: Alert Behavior During Therapy: Flat affect Cognition: No family/caregiver present to determine baseline             OT - Cognition Comments: follows one step commands, decreased self implementation of visual compensatory strategies despite chronic hemianopsia. fair awareness of physical limitations, able to report what she is working on with therapy and what she has difficulty with at home                 Following commands: Impaired Following commands impaired: Follows one step commands inconsistently, Follows one step commands with increased time     Cueing  General Comments   Cueing Techniques: Verbal cues;Tactile cues;Gestural cues  Noted upward and R gaze preference during conversation with patient. Able to state that she has a visual deficit to her L. Only once able to track finger to R, unable to track finger to L. Despite verbal and gestural cueing during visual assessment, pt with upward gaze preference that she could correct with inc effort. Noted possible central gaze deficit, as pt unable to see food tray in front of her and was reaching with L hand approximately 1 foot off target to her R side.   Exercises     Shoulder Instructions      Home Living Family/patient expects to be discharged to:: Assisted living  Additional Comments: Pt reports coming from [facility].      Prior Functioning/Environment Prior Level of Function : Needs assist             Mobility Comments: States she normally mobilizes using w/c, and pivots to and from w/c to bed and toilet with little to no assist. States she has not walked since October, she thinks. ADLs Comments: pt reports assist with LB ADL and bathing. active with OT and PT services    OT Problem List: Decreased strength;Decreased activity tolerance;Impaired balance (sitting and/or standing);Impaired vision/perception;Decreased  cognition;Decreased safety awareness;Decreased knowledge of use of DME or AE;Impaired UE functional use   OT Treatment/Interventions: Self-care/ADL training;Therapeutic exercise;DME and/or AE instruction;Cognitive remediation/compensation;Therapeutic activities;Visual/perceptual remediation/compensation;Patient/family education;Balance training      OT Goals(Current goals can be found in the care plan section)   Acute Rehab OT Goals Patient Stated Goal: work on my sitting balance and walk OT Goal Formulation: With patient Time For Goal Achievement: 01/24/24 Potential to Achieve Goals: Good   OT Frequency:  Min 2X/week    Co-evaluation              AM-PAC OT 6 Clicks Daily Activity     Outcome Measure Help from another person eating meals?: A Little Help from another person taking care of personal grooming?: A Little Help from another person toileting, which includes using toliet, bedpan, or urinal?: Total Help from another person bathing (including washing, rinsing, drying)?: A Lot Help from another person to put on and taking off regular upper body clothing?: A Lot Help from another person to put on and taking off regular lower body clothing?: Total 6 Click Score: 12   End of Session Equipment Utilized During Treatment: Gait belt Nurse Communication: Mobility status  Activity Tolerance: Patient tolerated treatment well Patient left: in chair;with chair alarm set;with call bell/phone within reach  OT Visit Diagnosis: Unsteadiness on feet (R26.81);Muscle weakness (generalized) (M62.81)                Time: 0941-1000 OT Time Calculation (min): 19 min Charges:  OT General Charges $OT Visit: 1 Visit OT Evaluation $OT Eval Moderate Complexity: 1 Mod  Elma JONETTA Lebron FREDERICK, OTR/L [facility] Acute Rehabilitation Office: [phone]   Elma JONETTA Lebron 01/10/2024, 11:52 AM

## 2024-01-10 NOTE — TOC Transition Note (Signed)
 Transition of Care [facility]) - Discharge Note   Patient Details  Name: Kathleen Figueroa MRN: [id number] Date of Birth: October 29, 1935  Transition of [facility]) CM/SW Contact:  Almarie CHRISTELLA Goodie, LCSW Phone Number: 01/10/2024, 11:01 AM   Clinical Narrative:   CSW updated by MD that patient stable for discharge, coordinated with PT and OT for recommendations. Patient active with [facility], able to resume. Orders sent to [facility] to resume care, discharge information sent to [facility]. CSW spoke with daughter, Tempie, she is in agreement with return. Transport arranged with [facility] for next available.  Nurse to call report to [phone].    Final next level of care: Assisted Living Barriers to Discharge: Barriers Resolved   Patient Goals and CMS Choice            Discharge Placement                Patient to be transferred to facility by: [facility] Name of family member notified: Sherie Patient and family notified of of transfer: 01/10/24  Discharge Plan and Services Additional resources added to the After Visit Summary for                            [facility] Arranged: PT, OT HH Agency: Other - See comment International Aid/development Worker) Date HH Agency Contacted: 01/10/24   Representative spoke with at [facility] Agency: Angeline  Social Drivers of Health (SDOH) Interventions SDOH Screenings   Food Insecurity: No Food Insecurity (01/08/2024)  Housing: Low Risk  (01/08/2024)  Transportation Needs: No Transportation Needs (01/08/2024)  Utilities: Not At Risk (01/08/2024)  Depression (PHQ2-9): Low Risk  (04/16/2023)  Social Connections: Moderately Isolated (01/08/2024)  Tobacco Use: Medium Risk (06/28/2023)     Readmission Risk Interventions     No data to display

## 2024-01-14 DIAGNOSIS — N1831 Chronic kidney disease, stage 3a: Secondary | ICD-10-CM | POA: Diagnosis not present

## 2024-01-14 DIAGNOSIS — R569 Unspecified convulsions: Secondary | ICD-10-CM | POA: Diagnosis not present

## 2024-01-27 ENCOUNTER — Ambulatory Visit: Admitting: Neurology

## 2024-01-27 ENCOUNTER — Encounter: Payer: Self-pay | Admitting: Neurology

## 2024-01-27 VITALS — BP 150/76 | HR 72 | Ht 66.0 in | Wt 170.0 lb

## 2024-01-27 DIAGNOSIS — I69354 Hemiplegia and hemiparesis following cerebral infarction affecting left non-dominant side: Secondary | ICD-10-CM | POA: Insufficient documentation

## 2024-01-27 DIAGNOSIS — I639 Cerebral infarction, unspecified: Secondary | ICD-10-CM

## 2024-01-27 DIAGNOSIS — Z9889 Other specified postprocedural states: Secondary | ICD-10-CM | POA: Diagnosis not present

## 2024-01-27 NOTE — Progress Notes (Signed)
 Chief Complaint  Patient presents with   New Patient (Initial Visit)    Pt in 14. Daughter in room. paper/[facility] [phone] like activity.      ASSESSMENT AND PLAN  Ilynn Stauffer is a 88 y.o. female   Stroke Residual spastic left hemiparesis, left Hemi visual field deficit, left hemineglect  Multiple vascular risk factor, aging, hypertension hyperlipidemia history of diabetes  Supposed to complete aspirin  81+ Brilinta  90 mg twice a day for 90 days, and then switched to aspirin  81+ Plavix  75 mg daily from April 2025,  MRI of the brain showed large right PCA stroke, also extensive periventricular small vessel disease previous right parietal stroke, repeat MRI showed no new stroke,  Intermittent increased confusion, can be multifactorial, she has significant cerebrovascular disease, generalized atrophy, likely vascular dementia, hard of hearing, poor vision certainly contributed, she had evidence of dehydration,  She does have increased risk for recurrent confusion episode, even partial seizure,  Will continue follow-up by facility primary care physician, return to clinic for new issues, agreed not to continue antiepileptic medications now,   DIAGNOSTIC DATA (LABS, IMAGING, TESTING) - I reviewed patient records, labs, notes, testing and imaging myself where available.   MEDICAL HISTORY:  Brayden Betters, is a 88 year old female, accompanied by her daughter, follow-up for stroke,  History is obtained from the patient and review of electronic medical records. I personally reviewed pertinent available imaging films in PACS.   PMHx of  Stroke Left carotid artery stenosis status post endarterectomy Diabetes HLD HTN Left detached retinal-poor left vision, right cataract surgery.  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, she was doing Loss adjuster, chartered through [employer], was very active at her church, travel to [place] just a few months ago, but she has quit driving due to her eye issues  She was found down at home with left-sided weakness on February 11, 2023, MRI showed large right PCA territory stroke, petechiae hemorrhage and edema without significant mass effect,  CT angiogram of head and neck, intracranial atherosclerotic disease, with moderate left, mild right intracranial ICA stenosis severe right V4 stenosis, severe left vertebral artery origin stenosis, moderate plaque at the right carotid bifurcation without significant stenosis or occlusion  Echocardiogram normal ejection fraction,  LDL 193, A1c 6.5  She was on Plavix  75 daily prior to admission, was treated with aspirin  81+ Brilinta  90 mg twice daily for 90 days, then should continue on aspirin  and Plavix  75 mg daily per vascular neurologist Dr. Rosemarie  She had history of stroke, in 2015, presenting with right hand and arm clumsiness, weakness, slurred speech, MRI of the brain showed multiple small foci of acute left hemisphere, watershed infarction, Doppler study showed left internal carotid artery 80 to 99% stenosis later underwent left carotid endarterectomy  She lives at a rehabilitation, going to be discharged with her daughter soon, I was also able to update her son Prentice via phone call today,  UPDATE Dec 18th 2025: She has been in persistent through living [facility] since August 2025, choose to go assistant living,  level II care, 1.2, worse than her baseline, assistance in her daily activity, but remain active for a while, participate activities,   She was sent to hospital on January 08, 2024, when she had left arm spastic movement, seems to have increased confusion,  Worried about the possibility of seizure, she was loaded with  Keppra  500 mg IV, only once by reviewing the record on January 08, 2024  Since then she has increased confusion, agitation,  lasted for few days, just began to recover, but not back to baseline yet  Had a repeat MRI of the brain, no acute stroke, advanced chronic small vessel disease evaluation of large right PCA territory infarction, laminar necrosis in the medial right occipital lobe, with mild micro hemorrhage, CT angiogram head and neck 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, intracranial atherosclerotic disease,  Daughter is concerned that dose of Keppra  leading to her increased confusion, though it is less likely, she only received 1 dose 500 mg Keppra  on January 08, 2024,  Laboratory normal CBC, hemoglobin of 12.2, elevated creatinine 1.2, with elevated BUN 25, decreased GFR 44 this is all worsening of her baseline, indicating dehydration, which can certainly contributed to her worsening confusion  EEG was done not reported, personally reviewed the tracing, she was in sleep most of the tracing, mild to moderate background slowing, worse on the right, no epileptiform discharge   PHYSICAL EXAM:   Vitals:   01/27/24 1509 01/27/24 1516  BP: (!) 172/77 (!) 150/76  Pulse: 72   Weight: 170 lb (77.1 kg)   Height: 5' 6 (1.676 m)     Body mass index is 27.44 kg/m.  PHYSICAL EXAMNIATION:  Gen: NAD, conversant, well nourised, well groomed                     Cardiovascular: Regular rate rhythm, no peripheral edema, warm, nontender. Eyes: Conjunctivae clear without exudates or hemorrhage Neck: Supple, no carotid bruits. Pulmonary: Clear to auscultation bilaterally   NEUROLOGICAL EXAM:  MENTAL STATUS: Speech/cognition: Drifting to sleep during conversation, reliant on her family to provide history, cooperative on examination, hard of hearing, poor vision, CRANIAL NERVES: CN II: Postsurgical pupil change, left Hemi visual field deficit, poor vision bilaterally, could not count finger CN III, IV, VI: extraocular movement are normal. No ptosis. CN V: Facial sensation is intact to  light touch CN VII: Face is symmetric with normal eye closure  CN VIII: Hearing is normal to causal conversation. CN IX, X: Phonation is normal. CN XI: Head turning and shoulder shrug are intact  MOTOR: Left hemiparesis, antigravity movement of left arm, and leg,  REFLEXES: Present fairly symmetric  SENSORY: Intact to light touch, no clear hemineglect  COORDINATION: There is no trunk or limb dysmetria noted.  GAIT/STANCE: deferred  REVIEW OF SYSTEMS:  Full 14 system review of systems performed and notable only for as above All other review of systems were negative.   ALLERGIES: Allergies  Allergen Reactions   Aspirin  Nausea Only and Other (See Comments)    Higher dose = nausea   Metformin  Other (See Comments)    Stomach upset    HOME MEDICATIONS: Current Outpatient Medications  Medication Sig Dispense Refill   acetaminophen  (TYLENOL ) 325 MG tablet Take 650 mg by mouth every 6 (six) hours as needed for mild pain (pain score 1-3) or moderate pain (pain score 4-6).     amLODipine  (NORVASC ) 10 MG tablet Take 1 tablet (10 mg total) by mouth daily. 30 tablet 0   aspirin  EC 81 MG tablet Take 1 tablet (81 mg total) by mouth daily. Swallow whole.     atorvastatin  (LIPITOR ) 80 MG tablet Take 1 tablet (80 mg total) by mouth daily.     clopidogrel  (PLAVIX ) 75 MG tablet Take 1 tablet (75 mg total) by  mouth daily. 90 tablet 3   diphenhydrAMINE  (BENADRYL ) 25 mg capsule Take 25 mg by mouth every 6 (six) hours as needed for itching, sleep or allergies.     feeding supplement (BOOST HIGH PROTEIN) LIQD Take 1 Container by mouth 3 (three) times daily between meals.     Multiple Vitamin (MULTIVITAMIN WITH MINERALS) TABS tablet Take 1 tablet by mouth daily.     polyethylene glycol (MIRALAX  / GLYCOLAX ) 17 g packet Take 17 g by mouth 2 (two) times daily.     No current facility-administered medications for this visit.    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)    CVA (cerebral vascular accident) (HCC) 11/12/2022   Diabetes mellitus type 2, uncontrolled, 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   H/O detached retina repair    Hypertension    TIA (transient ischemic attack)     PAST SURGICAL HISTORY: Past Surgical History:  Procedure Laterality Date   ENDARTERECTOMY Left 01/19/2014   Procedure: ENDARTERECTOMY CAROTID-LEFT;  Surgeon: Gaile LELON New, MD;  [facility];  Service: Vascular;  Laterality: Left;   EYE SURGERY     lens removed from right eye   PATCH ANGIOPLASTY Left 01/19/2014   Procedure: PATCH ANGIOPLASTY, LEFT CAROTID ARTERY USING HEMASHIELD PLATINUM FINESSE PATCH;  Surgeon: Gaile LELON New, MD;  Location: [facility] OR;  Service: Vascular;  Laterality: Left;   TONSILLECTOMY      FAMILY HISTORY: Family History  Problem Relation Age of Onset   Hypertension Mother    Diabetes Mother    Hypertension Father     SOCIAL HISTORY: Social History   Socioeconomic History   Marital status: Widowed    Spouse name: Not on file   Number of children: 5   Years of education: 2 yrs coll   Highest education level: Not on file  Occupational History   Occupation: retired  Tobacco Use   Smoking status: Former    Current packs/day: 0.00    Types: Cigarettes    Quit date: 02/09/1978    Years since quitting: 45.9   Smokeless tobacco: Never  Vaping Use   Vaping status: Not on file  Substance and Sexual Activity   Alcohol  use: No    Alcohol /week: 0.0 standard drinks of alcohol    Drug use: No   Sexual activity: Not on file  Other Topics Concern   Not on file  Social History Narrative   Caffeine (low caffeine), some chocolate.   Social Drivers of Health    Tobacco Use: Medium Risk (01/27/2024)   Patient History    Smoking Tobacco Use: Former    Smokeless Tobacco Use: Never    Passive Exposure: Not on Actuary Strain: Not on file  Food Insecurity: No Food Insecurity (01/08/2024)   Epic    Worried About Programme Researcher, Broadcasting/film/video in the Last Year: Never true    Ran Out of Food in the Last Year: Never true  Transportation Needs: No Transportation Needs (01/08/2024)   Epic    Lack of Transportation (Medical): No    Lack of Transportation (Non-Medical): No  Physical Activity: Not on file  Stress: Not on file  Social Connections: Moderately Isolated (  01/08/2024)   Social Connection and Isolation Panel    Frequency of Communication with Friends and Family: Three times a week    Frequency of Social Gatherings with Friends and Family: More than three times a week    Attends Religious Services: More than 4 times per year    Active Member of Clubs or Organizations: No    Attends Banker Meetings: Never    Marital Status: Widowed  Intimate Partner Violence: Not At Risk (02/11/2023)   Humiliation, Afraid, Rape, and Kick questionnaire    Fear of Current or Ex-Partner: No    Emotionally Abused: No    Physically Abused: No    Sexually Abused: No  Depression (PHQ2-9): Low Risk (04/16/2023)   Depression (PHQ2-9)    PHQ-2 Score: 0  Alcohol  Screen: Not on file  Housing: Low Risk (01/08/2024)   Epic    Unable to Pay for Housing in the Last Year: No    Number of Times Moved in the Last Year: 1    Homeless in the Last Year: No  Utilities: Not At Risk (01/08/2024)   Epic    Threatened with loss of utilities: No  Health Literacy: Not on file      Modena Callander, M.D. Ph.D.  [facility] [address], [address] [place], [place] [address] Ph: [phone] Fax: [phone]  CC:  Katina Pfeiffer, PA-C [address] [place],  [place] [address]  Katina Pfeiffer, PA-C    I personally spent a total of  40 minutes in the care of the patient today including preparing to see the patient, getting/reviewing separately obtained history, performing a medically appropriate exam/evaluation, counseling and educating, documenting clinical information in the EHR, independently interpreting results, and communicating results.
